# Patient Record
Sex: Female | Born: 1945 | Race: White | Hispanic: No | Smoking: Never smoker
Health system: Southern US, Community
[De-identification: ages and names within clinical notes are randomized; demographics above are authoritative.]

## PROBLEM LIST (undated history)

## (undated) DIAGNOSIS — F419 Anxiety disorder, unspecified: Secondary | ICD-10-CM

## (undated) DIAGNOSIS — I10 Essential (primary) hypertension: Secondary | ICD-10-CM

## (undated) DIAGNOSIS — H269 Unspecified cataract: Secondary | ICD-10-CM

## (undated) DIAGNOSIS — M6282 Rhabdomyolysis: Secondary | ICD-10-CM

## (undated) DIAGNOSIS — M1990 Unspecified osteoarthritis, unspecified site: Secondary | ICD-10-CM

## (undated) DIAGNOSIS — G3183 Dementia with Lewy bodies: Secondary | ICD-10-CM

## (undated) DIAGNOSIS — G20 Parkinson's disease: Secondary | ICD-10-CM

## (undated) DIAGNOSIS — T7840XA Allergy, unspecified, initial encounter: Secondary | ICD-10-CM

## (undated) DIAGNOSIS — K219 Gastro-esophageal reflux disease without esophagitis: Secondary | ICD-10-CM

## (undated) DIAGNOSIS — G459 Transient cerebral ischemic attack, unspecified: Secondary | ICD-10-CM

## (undated) DIAGNOSIS — R32 Unspecified urinary incontinence: Secondary | ICD-10-CM

## (undated) DIAGNOSIS — R1311 Dysphagia, oral phase: Secondary | ICD-10-CM

## (undated) DIAGNOSIS — R296 Repeated falls: Secondary | ICD-10-CM

## (undated) DIAGNOSIS — E785 Hyperlipidemia, unspecified: Secondary | ICD-10-CM

## (undated) DIAGNOSIS — R443 Hallucinations, unspecified: Secondary | ICD-10-CM

## (undated) DIAGNOSIS — I639 Cerebral infarction, unspecified: Secondary | ICD-10-CM

## (undated) DIAGNOSIS — F32A Depression, unspecified: Secondary | ICD-10-CM

## (undated) DIAGNOSIS — R262 Difficulty in walking, not elsewhere classified: Secondary | ICD-10-CM

## (undated) DIAGNOSIS — M797 Fibromyalgia: Secondary | ICD-10-CM

## (undated) DIAGNOSIS — R600 Localized edema: Secondary | ICD-10-CM

## (undated) DIAGNOSIS — J45909 Unspecified asthma, uncomplicated: Secondary | ICD-10-CM

## (undated) DIAGNOSIS — F329 Major depressive disorder, single episode, unspecified: Secondary | ICD-10-CM

## (undated) DIAGNOSIS — F0390 Unspecified dementia without behavioral disturbance: Secondary | ICD-10-CM

## (undated) HISTORY — DX: Repeated falls: R29.6

## (undated) HISTORY — DX: Allergy, unspecified, initial encounter: T78.40XA

## (undated) HISTORY — DX: Unspecified asthma, uncomplicated: J45.909

## (undated) HISTORY — DX: Cerebral infarction, unspecified: I63.9

## (undated) HISTORY — DX: Rhabdomyolysis: M62.82

## (undated) HISTORY — DX: Unspecified osteoarthritis, unspecified site: M19.90

## (undated) HISTORY — DX: Anxiety disorder, unspecified: F41.9

## (undated) HISTORY — DX: Dysphagia, oral phase: R13.11

## (undated) HISTORY — DX: Essential (primary) hypertension: I10

## (undated) HISTORY — PX: CATARACT EXTRACTION: SUR2

## (undated) HISTORY — PX: CHOLECYSTECTOMY: SHX55

## (undated) HISTORY — PX: TONSILLECTOMY AND ADENOIDECTOMY: SUR1326

## (undated) HISTORY — DX: Depression, unspecified: F32.A

## (undated) HISTORY — PX: ABDOMINAL HYSTERECTOMY: SHX81

## (undated) HISTORY — DX: Unspecified dementia, unspecified severity, without behavioral disturbance, psychotic disturbance, mood disturbance, and anxiety: F03.90

## (undated) HISTORY — DX: Hyperlipidemia, unspecified: E78.5

## (undated) HISTORY — DX: Transient cerebral ischemic attack, unspecified: G45.9

## (undated) HISTORY — DX: Fibromyalgia: M79.7

## (undated) HISTORY — DX: Unspecified urinary incontinence: R32

## (undated) HISTORY — DX: Localized edema: R60.0

## (undated) HISTORY — DX: Neurocognitive disorder with Lewy bodies: G31.83

## (undated) HISTORY — DX: Unspecified cataract: H26.9

## (undated) HISTORY — DX: Major depressive disorder, single episode, unspecified: F32.9

## (undated) HISTORY — DX: Hallucinations, unspecified: R44.3

## (undated) HISTORY — DX: Difficulty in walking, not elsewhere classified: R26.2

## (undated) HISTORY — DX: Parkinson's disease: G20

## (undated) HISTORY — DX: Gastro-esophageal reflux disease without esophagitis: K21.9

---

## 2016-11-20 DIAGNOSIS — Z9841 Cataract extraction status, right eye: Secondary | ICD-10-CM | POA: Diagnosis not present

## 2016-11-20 DIAGNOSIS — K5900 Constipation, unspecified: Secondary | ICD-10-CM | POA: Diagnosis present

## 2016-11-20 DIAGNOSIS — E785 Hyperlipidemia, unspecified: Secondary | ICD-10-CM | POA: Diagnosis present

## 2016-11-20 DIAGNOSIS — M6282 Rhabdomyolysis: Secondary | ICD-10-CM | POA: Diagnosis present

## 2016-11-20 DIAGNOSIS — R531 Weakness: Secondary | ICD-10-CM | POA: Diagnosis present

## 2016-11-20 DIAGNOSIS — Z9842 Cataract extraction status, left eye: Secondary | ICD-10-CM | POA: Diagnosis not present

## 2016-11-20 DIAGNOSIS — Z8673 Personal history of transient ischemic attack (TIA), and cerebral infarction without residual deficits: Secondary | ICD-10-CM | POA: Diagnosis not present

## 2016-11-20 DIAGNOSIS — F419 Anxiety disorder, unspecified: Secondary | ICD-10-CM | POA: Diagnosis present

## 2016-11-20 DIAGNOSIS — M797 Fibromyalgia: Secondary | ICD-10-CM | POA: Diagnosis present

## 2016-11-20 DIAGNOSIS — Z885 Allergy status to narcotic agent status: Secondary | ICD-10-CM | POA: Diagnosis not present

## 2016-11-20 DIAGNOSIS — E876 Hypokalemia: Secondary | ICD-10-CM | POA: Diagnosis present

## 2016-11-20 DIAGNOSIS — R609 Edema, unspecified: Secondary | ICD-10-CM | POA: Diagnosis present

## 2016-11-20 DIAGNOSIS — Z9181 History of falling: Secondary | ICD-10-CM | POA: Diagnosis not present

## 2016-11-20 DIAGNOSIS — F329 Major depressive disorder, single episode, unspecified: Secondary | ICD-10-CM | POA: Diagnosis present

## 2016-11-20 DIAGNOSIS — R32 Unspecified urinary incontinence: Secondary | ICD-10-CM | POA: Diagnosis present

## 2016-11-20 DIAGNOSIS — Z79899 Other long term (current) drug therapy: Secondary | ICD-10-CM | POA: Diagnosis not present

## 2016-11-20 DIAGNOSIS — Z9049 Acquired absence of other specified parts of digestive tract: Secondary | ICD-10-CM | POA: Diagnosis not present

## 2016-11-20 DIAGNOSIS — F0390 Unspecified dementia without behavioral disturbance: Secondary | ICD-10-CM | POA: Diagnosis present

## 2016-11-20 DIAGNOSIS — J45909 Unspecified asthma, uncomplicated: Secondary | ICD-10-CM | POA: Diagnosis present

## 2016-11-20 DIAGNOSIS — I10 Essential (primary) hypertension: Secondary | ICD-10-CM | POA: Diagnosis present

## 2017-03-18 ENCOUNTER — Non-Acute Institutional Stay (SKILLED_NURSING_FACILITY): Payer: Medicare Other | Admitting: Adult Health

## 2017-03-18 ENCOUNTER — Encounter: Payer: Self-pay | Admitting: Adult Health

## 2017-03-18 DIAGNOSIS — I10 Essential (primary) hypertension: Secondary | ICD-10-CM

## 2017-03-18 DIAGNOSIS — J45909 Unspecified asthma, uncomplicated: Secondary | ICD-10-CM

## 2017-03-18 DIAGNOSIS — F339 Major depressive disorder, recurrent, unspecified: Secondary | ICD-10-CM | POA: Diagnosis not present

## 2017-03-18 DIAGNOSIS — G20 Parkinson's disease: Secondary | ICD-10-CM

## 2017-03-18 DIAGNOSIS — R1311 Dysphagia, oral phase: Secondary | ICD-10-CM | POA: Diagnosis not present

## 2017-03-18 DIAGNOSIS — M159 Polyosteoarthritis, unspecified: Secondary | ICD-10-CM

## 2017-03-18 DIAGNOSIS — E559 Vitamin D deficiency, unspecified: Secondary | ICD-10-CM | POA: Diagnosis not present

## 2017-03-18 DIAGNOSIS — G459 Transient cerebral ischemic attack, unspecified: Secondary | ICD-10-CM

## 2017-03-18 DIAGNOSIS — M150 Primary generalized (osteo)arthritis: Secondary | ICD-10-CM

## 2017-03-18 DIAGNOSIS — F0150 Vascular dementia without behavioral disturbance: Secondary | ICD-10-CM

## 2017-03-18 DIAGNOSIS — K219 Gastro-esophageal reflux disease without esophagitis: Secondary | ICD-10-CM | POA: Diagnosis not present

## 2017-03-18 DIAGNOSIS — E785 Hyperlipidemia, unspecified: Secondary | ICD-10-CM | POA: Diagnosis not present

## 2017-03-18 DIAGNOSIS — M1990 Unspecified osteoarthritis, unspecified site: Secondary | ICD-10-CM | POA: Insufficient documentation

## 2017-03-18 HISTORY — DX: Essential (primary) hypertension: I10

## 2017-03-18 NOTE — Progress Notes (Signed)
Location:   [facility] Room Number: 121 A Place of Service:  SNF (31)   CODE STATUS: Full Code  Allergies  Allergen Reactions  . Hydrocodone Other (See Comments)    Unknown reaction    Chief Complaint  Patient presents with  . Acute Visit    Transfer follow up    HPI:  She has been moved from [place] with her daughter. She tells me that this is a long term placement. She tells me that she is still tired from her trip but otherwise feels pretty good. There are no nursing concerns at this time.    Past Medical History:  Diagnosis Date  . Allergy   . Anxiety   . Arthritis   . Asthma   . Cataract   . Dementia   . Depression   . Difficulty walking   . Dysphagia, oral phase   . Fibromyalgia   . GERD (gastroesophageal reflux disease)   . Hallucinations   . Hyperlipidemia   . Hypertension   . Lower extremity edema   . Parkinson disease (HCC)   . Repeated falls   . Rhabdomyolysis   . Stroke (HCC)   . TIA (transient ischemic attack)   . Urinary incontinence    Past Surgical History:  Procedure Laterality Date  . ABDOMINAL HYSTERECTOMY    . CATARACT EXTRACTION    . CHOLECYSTECTOMY    . TONSILLECTOMY AND ADENOIDECTOMY       Social History   Social History  . Marital status: Divorced    Spouse name: N/A  . Number of children: N/A  . Years of education: N/A   Occupational History  . Not on file.   Social History Main Topics  . Smoking status: Never Smoker  . Smokeless tobacco: Never Used  . Alcohol use No  . Drug use: No  . Sexual activity: No   Other Topics Concern  . Not on file   Social History Narrative  . No narrative on file   Family History  Problem Relation Age of Onset  . Family history unknown: Yes      VITAL SIGNS BP (!) 123/59   Pulse 76   Temp 98 F (36.7 C)   Resp 18   Ht  (1.651 m)   SpO2 95%   Patient's Medications  New Prescriptions   No medications on file  Previous Medications   ACETAMINOPHEN (TYLENOL) 325 MG TABLET    Take 650 mg by mouth every 6 (six) hours as needed for mild pain or moderate pain.   ALBUTEROL (PROVENTIL HFA;VENTOLIN HFA) 108 (90 BASE) MCG/ACT INHALER    Inhale 2 puffs into the lungs every 6 (six) hours as needed for wheezing or shortness of breath.   ALUMINUM-MAGNESIUM HYDROXIDE-SIMETHICONE (MAALOX) 200-200-20 MG/5ML SUSP    Take 30 mLs by mouth every 6 (six) hours as needed.   AMLODIPINE (NORVASC) 10 MG TABLET    Take 10 mg by mouth daily.   BISACODYL (DULCOLAX) 10 MG SUPPOSITORY    Place 10 mg rectally daily as needed for moderate constipation.   CARBIDOPA-LEVODOPA (SINEMET IR) 25-100 MG TABLET    Take 1 tablet by mouth 3 (three) times daily.   CELECOXIB (CELEBREX) 200 MG CAPSULE    Take 200 mg by mouth daily.   DONEPEZIL (ARICEPT) 10 MG TABLET    Take 10 mg by mouth daily.   DULOXETINE (CYMBALTA) 30 MG CAPSULE    Give 3 capsules by mouth one time a day  ERGOCALCIFEROL (VITAMIN D2) 50000 UNITS CAPSULE    Take 50,000 Units by mouth once a week. Every Wednesday   FLUTICASONE (FLONASE) 50 MCG/ACT NASAL SPRAY    Place 2 sprays into both nostrils daily.   LORATADINE (CLARITIN) 10 MG TABLET    Take 10 mg by mouth daily.   LORAZEPAM (ATIVAN) 1 MG TABLET    Take 1 mg by mouth every 12 (twelve) hours.    MEMANTINE (NAMENDA) 10 MG TABLET    Take 10 mg by mouth 2 (two) times daily.   PANTOPRAZOLE (PROTONIX) 40 MG TABLET    Take 40 mg by mouth daily.   ROSUVASTATIN (CRESTOR) 5 MG TABLET    Take 5 mg by mouth at bedtime.   TRAMADOL (ULTRAM) 50 MG TABLET    Take 50 mg by mouth every 6 (six) hours as needed.   UNABLE TO FIND    HSG Regular Diet - HSG Mech Soft Texture, Regular consistency   ZIPRASIDONE (GEODON) 20 MG CAPSULE    Take 20 mg by mouth daily.  Modified Medications   No medications on file  Discontinued Medications   No medications on file     SIGNIFICANT DIAGNOSTIC EXAMS    LABS REVIEWED:   None available   Review of Systems    Constitutional: Negative for malaise/fatigue.  Respiratory: Negative for cough and shortness of breath.   Cardiovascular: Negative for chest pain, palpitations and leg swelling.  Gastrointestinal: Negative for abdominal pain, constipation and heartburn.  Musculoskeletal: Negative for back pain, joint pain and myalgias.  Skin: Negative.   Neurological: Positive for tremors. Negative for dizziness.       From parkinson disease   Psychiatric/Behavioral: The patient is not nervous/anxious.     Physical Exam  Constitutional: She is oriented to person, place, and time. She appears well-developed and well-nourished. No distress.  Eyes: Conjunctivae are normal.  Neck: Neck supple. No JVD present. No thyromegaly present.  Cardiovascular: Normal rate, regular rhythm and intact distal pulses.   Respiratory: Effort normal and breath sounds normal. No respiratory distress. She has no wheezes.  GI: Soft. Bowel sounds are normal. She exhibits no distension. There is no tenderness.  Musculoskeletal: She exhibits no edema.  Able to move all extremities   Lymphadenopathy:    She has no cervical adenopathy.  Neurological: She is alert and oriented to person, place, and time.  Does have fine tremor bilaterally   Skin: Skin is warm and dry. She is not diaphoretic.  Psychiatric: She has a normal mood and affect.    ASSESSMENT/ PLAN:  1. Hypertension: b/p: 123/59: will continue norvasc 10 mg daily   2. Dyslipidemia: will continue crestor 5 mg daily   3. Osteoarthritis: will continue celebrex 200 mg daily has ultram 50 mg every 6 hours as needed for pain   4. Parkinson's disease: does have a fine tremor: will continue sinemet IR 25/100 mg three times daily  5. Vascular dementia: will continue aricept 10 mg daily and namenda 10 mg twice daily   6. Asthma with allergic rhinitis: will continue flonase nightly claritin 10 mg daily and albuterol 2 puffs every 6 hours as needed   7. Vit D deficiency:  will continue vitamin D 50,000 units weekly  8. Major recurrent depression: will continue cymbalta 90 mg daily and takes ativan 1 mg twice daily for anxiety is taking geodon 20 mg daily for feelings of agitation   9. gerd: will continue protonix 40 mg daily   10.  TIA:  is status post CVA: is neurologically stable will monitor   11. Dysphagia: no signs of aspiration present; will not make changes.     Will check cbc; cmp; hgb a1c; lipids; vit d; tsh    Time spent with patient  60   minutes >50% time spent counseling; reviewing medical record; tests; labs; and developing future plan of care   MD is aware of resident's narcotic use and is in agreement with current plan of care. We will attempt to wean resident as apropriate   Synthia Innocent NP [facility]  Contact [phone] Monday through Friday 8am- 5pm  After hours call [phone]

## 2017-03-19 LAB — HEPATIC FUNCTION PANEL
ALT: 10 U/L (ref 7–35)
AST: 14 U/L (ref 13–35)
Alkaline Phosphatase: 111 U/L (ref 25–125)
Bilirubin, Total: 0.7 mg/dL

## 2017-03-19 LAB — LIPID PANEL
CHOLESTEROL: 121 mg/dL (ref 0–200)
HDL: 39 mg/dL (ref 35–70)
LDL Cholesterol: 56 mg/dL
TRIGLYCERIDES: 134 mg/dL (ref 40–160)

## 2017-03-19 LAB — BASIC METABOLIC PANEL
BUN: 12 mg/dL (ref 4–21)
Creatinine: 0.6 mg/dL (ref 0.5–1.1)
Glucose: 95 mg/dL
POTASSIUM: 3 mmol/L — AB (ref 3.4–5.3)
SODIUM: 144 mmol/L (ref 137–147)

## 2017-03-19 LAB — HEMOGLOBIN A1C: Hemoglobin A1C: 4.7

## 2017-03-19 LAB — CBC AND DIFFERENTIAL
HCT: 40 % (ref 36–46)
HEMATOCRIT: 40 % (ref 36–46)
HEMOGLOBIN: 14.5 g/dL (ref 12.0–16.0)
Hemoglobin: 14.5 g/dL (ref 12.0–16.0)
Neutrophils Absolute: 2 /uL
Neutrophils Absolute: 2 /uL
Platelets: 229 10*3/uL (ref 150–399)
WBC: 4.7 10*3/mL
WBC: 4.7 10^3/mL

## 2017-03-19 LAB — TSH: TSH: 2.49 u[IU]/mL (ref 0.41–5.90)

## 2017-03-25 ENCOUNTER — Non-Acute Institutional Stay (SKILLED_NURSING_FACILITY): Payer: Medicare Other | Admitting: Adult Health

## 2017-03-25 ENCOUNTER — Encounter: Payer: Self-pay | Admitting: Adult Health

## 2017-03-25 DIAGNOSIS — I10 Essential (primary) hypertension: Secondary | ICD-10-CM | POA: Diagnosis not present

## 2017-03-25 DIAGNOSIS — F339 Major depressive disorder, recurrent, unspecified: Secondary | ICD-10-CM

## 2017-03-25 NOTE — Progress Notes (Signed)
Location:   [facility] Room Number: 121 A Place of Service:  SNF (31)   CODE STATUS: Full Code  Allergies  Allergen Reactions  . Hydrocodone Other (See Comments)    Unknown reaction    Chief Complaint  Patient presents with  . Acute Visit    Anxiety    HPI:  She is complaining of increased anxiety. She tells me that in the past she had been on ativan 1 mg three times daily and that twice a day is no longer effective in managing her anxiety.    Past Medical History:  Diagnosis Date  . Allergy   . Anxiety   . Arthritis   . Asthma   . Cataract   . Dementia   . Depression   . Difficulty walking   . Dysphagia, oral phase   . Fibromyalgia   . GERD (gastroesophageal reflux disease)   . Hallucinations   . Hyperlipidemia   . Hypertension   . Lower extremity edema   . Parkinson disease (HCC)   . Repeated falls   . Rhabdomyolysis   . Stroke (HCC)   . TIA (transient ischemic attack)   . Urinary incontinence     Past Surgical History:  Procedure Laterality Date  . ABDOMINAL HYSTERECTOMY    . CATARACT EXTRACTION    . CHOLECYSTECTOMY    . TONSILLECTOMY AND ADENOIDECTOMY      Social History   Social History  . Marital status: Divorced    Spouse name: N/A  . Number of children: N/A  . Years of education: N/A   Occupational History  . Not on file.   Social History Main Topics  . Smoking status: Never Smoker  . Smokeless tobacco: Never Used  . Alcohol use No  . Drug use: No  . Sexual activity: No   Other Topics Concern  . Not on file   Social History Narrative  . No narrative on file   Family History  Problem Relation Age of Onset  . Family history unknown: Yes      VITAL SIGNS BP (!) 111/55   Pulse 76   Temp 98 F (36.7 C)   Resp 18   Ht 5\' 5"  (1.651 m)   Wt 153 lb 12.8 oz (69.8 kg)   SpO2 95%   BMI 25.59 kg/m   Patient's Medications  New Prescriptions   No medications on file  Previous Medications   ACETAMINOPHEN  (TYLENOL) 325 MG TABLET    Take 650 mg by mouth every 6 (six) hours as needed for mild pain or moderate pain.   ALBUTEROL (PROVENTIL HFA;VENTOLIN HFA) 108 (90 BASE) MCG/ACT INHALER    Inhale 2 puffs into the lungs every 6 (six) hours as needed for wheezing or shortness of breath.   ALUMINUM-MAGNESIUM HYDROXIDE-SIMETHICONE (MAALOX) 200-200-20 MG/5ML SUSP    Take 30 mLs by mouth every 6 (six) hours as needed.   AMLODIPINE (NORVASC) 10 MG TABLET    Take 10 mg by mouth daily.   BISACODYL (DULCOLAX) 10 MG SUPPOSITORY    Place 10 mg rectally daily as needed for moderate constipation.   CARBIDOPA-LEVODOPA (SINEMET IR) 25-100 MG TABLET    Take 1 tablet by mouth 3 (three) times daily.   DONEPEZIL (ARICEPT) 10 MG TABLET    Take 10 mg by mouth daily.   DULOXETINE (CYMBALTA) 30 MG CAPSULE    Give 3 capsules by mouth one time a day   ERGOCALCIFEROL (VITAMIN D2) 50000 UNITS CAPSULE  Take 50,000 Units by mouth once a week. Every Wednesday   FLUTICASONE (FLONASE) 50 MCG/ACT NASAL SPRAY    Place 2 sprays into both nostrils daily.   LORATADINE (CLARITIN) 10 MG TABLET    Take 10 mg by mouth daily.   LORAZEPAM (ATIVAN) 1 MG TABLET    Take 1 mg by mouth every 12 (twelve) hours.    MELOXICAM (MOBIC) 7.5 MG TABLET    Take 7.5 mg by mouth daily.   MEMANTINE (NAMENDA) 10 MG TABLET    Take 10 mg by mouth 2 (two) times daily.   PANTOPRAZOLE (PROTONIX) 40 MG TABLET    Take 40 mg by mouth daily.   ROSUVASTATIN (CRESTOR) 5 MG TABLET    Take 5 mg by mouth at bedtime.   TRAMADOL (ULTRAM) 50 MG TABLET    Take 50 mg by mouth every 6 (six) hours as needed.   UNABLE TO FIND    HSG Regular Diet - HSG Mech Soft Texture, Regular consistency   ZIPRASIDONE (GEODON) 20 MG CAPSULE    Take 20 mg by mouth daily.  Modified Medications   No medications on file  Discontinued Medications   CELECOXIB (CELEBREX) 200 MG CAPSULE    Take 200 mg by mouth daily.     SIGNIFICANT DIAGNOSTIC EXAMS  LABS REVIEWED:   02-19-17: wbc 8.6; hgb 14.8;  hct 45.9; mcv 91.6; plt 201; glucose 132; bun 21.9; creat 1.22; k+ 4.6; na++ 141; liver normal albumin 3.5    Review of Systems  Constitutional: Negative for malaise/fatigue.  Respiratory: Negative for cough and shortness of breath.   Cardiovascular: Negative for chest pain, palpitations and leg swelling.  Gastrointestinal: Negative for abdominal pain, constipation and heartburn.  Musculoskeletal: Negative for back pain, joint pain and myalgias.  Skin: Negative.   Neurological: Positive for tremors. Negative for dizziness.       From parkinson disease   Psychiatric/Behavioral: has anxiety      Physical Exam  Constitutional: She is oriented to person, place, and time. She appears well-developed and well-nourished. No distress.  Eyes: Conjunctivae are normal.  Neck: Neck supple. No JVD present. No thyromegaly present.  Cardiovascular: Normal rate, regular rhythm and intact distal pulses.   Respiratory: Effort normal and breath sounds normal. No respiratory distress. She has no wheezes.  GI: Soft. Bowel sounds are normal. She exhibits no distension. There is no tenderness.  Musculoskeletal: She exhibits no edema.  Able to move all extremities   Lymphadenopathy:    She has no cervical adenopathy.  Neurological: She is alert and oriented to person, place, and time.  Does have fine tremor bilaterally   Skin: Skin is warm and dry. She is not diaphoretic.  Psychiatric: She has a normal mood and affect.    ASSESSMENT/ PLAN:  1. Hypertension: b/p: 111/55: will continue norvasc 10 mg daily   2. Major recurrent depression: will continue cymbalta 90 mg daily and takes geodon 20 mg daily for feelings of agitation  Will increase her ativan to 1 mg three times daily      MD is aware of resident's narcotic use and is in agreement with current plan of care. We will attempt to wean resident as apropriate    Synthia Innocent NP [facility]  Contact [phone] Monday through  Friday 8am- 5pm  After hours call [phone]

## 2017-04-06 ENCOUNTER — Non-Acute Institutional Stay (SKILLED_NURSING_FACILITY): Payer: Medicare Other | Admitting: Internal Medicine

## 2017-04-06 ENCOUNTER — Encounter: Payer: Self-pay | Admitting: Internal Medicine

## 2017-04-06 DIAGNOSIS — M150 Primary generalized (osteo)arthritis: Secondary | ICD-10-CM | POA: Diagnosis not present

## 2017-04-06 DIAGNOSIS — G20 Parkinson's disease: Secondary | ICD-10-CM | POA: Diagnosis not present

## 2017-04-06 DIAGNOSIS — Z8673 Personal history of transient ischemic attack (TIA), and cerebral infarction without residual deficits: Secondary | ICD-10-CM | POA: Diagnosis not present

## 2017-04-06 DIAGNOSIS — E785 Hyperlipidemia, unspecified: Secondary | ICD-10-CM | POA: Diagnosis not present

## 2017-04-06 DIAGNOSIS — M159 Polyosteoarthritis, unspecified: Secondary | ICD-10-CM

## 2017-04-06 DIAGNOSIS — F0150 Vascular dementia without behavioral disturbance: Secondary | ICD-10-CM | POA: Diagnosis not present

## 2017-04-06 DIAGNOSIS — I10 Essential (primary) hypertension: Secondary | ICD-10-CM

## 2017-04-06 DIAGNOSIS — J45909 Unspecified asthma, uncomplicated: Secondary | ICD-10-CM

## 2017-04-06 DIAGNOSIS — F339 Major depressive disorder, recurrent, unspecified: Secondary | ICD-10-CM

## 2017-04-06 DIAGNOSIS — E559 Vitamin D deficiency, unspecified: Secondary | ICD-10-CM | POA: Diagnosis not present

## 2017-04-06 DIAGNOSIS — K219 Gastro-esophageal reflux disease without esophagitis: Secondary | ICD-10-CM

## 2017-04-06 NOTE — Progress Notes (Signed)
Patient ID: Jennifer Garner, female   DOB: 1946/06/18, 71 y.o.   MRN: [id number]    HISTORY AND PHYSICAL   DATE: 04/06/2017  Location:    [facility] Room Number: 111 B Place of Service: SNF (31)   Extended Emergency Contact Information Primary Emergency Contact: [name] Home Phone: [phone] Relation: Daughter Secondary Emergency Contact: [name],[name]  [place] Home Phone: [phone] Relation: Daughter  Advanced Directive information Does Patient Have a Medical Advance Directive?: Yes, Would patient like information on creating a medical advance directive?: No - Patient declined, Type of Advance Directive: Out of facility DNR (pink MOST or yellow form), Pre-existing out of facility DNR order (yellow form or pink MOST form): Pink MOST form placed in chart (order not valid for inpatient use), Does patient want to make changes to medical advance directive?: No - Patient declined  Chief Complaint  Patient presents with  . New Admit To SNF    Admission    HPI:  71 yo female seen today as a new admission into SNF following transfer from [place] SNF to be closer to family. She has a hx dementia, FMS, arthritis, frequent falls, Parkinson's, HTN, hyperlipidemia, hallucinations, hx CVA/TIA, depression/anxiety, urinary incontinence and asthma. No labs/nursing notes from previous facility available. She will be a long term resident.  Today she reports c/a increased tremors/stiffness. She saw a neurologist in [place]. She is a poor historian due to dementia. Hx obtained from chart.  HTN - BP stable on norvasc 10 mg daily   Dyslipidemia - stable on lipitor. LDL 56  Osteoarthritis/FMS - stable on meloxicam and ultram 50 mg every 6 hours as needed for pain; tylenol prn   Parkinson's disease - tremor uncontrolled. Takes sinemet IR 25/100 mg three times daily. She has seen neurology in the past  Vascular dementia - she has behavioral  disturbance. Takes aricept 10 mg daily and namenda 10 mg twice daily. She has prn lorazepam and geodone for agitation/psychosis. She has hallucinations  Asthma with allergic rhinitis - stable on flonase nightly; claritin 10 mg daily and albuterol 2 puffs every 6 hours as needed   Vitamin D deficiency - stable on vitamin D 50,000 units weekly  Major recurrent depression/anxiety - mood stable on cymbalta 90 mg daily; ativan 1 mg twice daily for anxiety; geodon 20 mg daily for psychosis   GERD - stable on protonix 40 mg daily   Hx TIA/ hx CVA - stable.   Hx urinary incontinence  Hx frequent falls Past Medical History:  Diagnosis Date  . Allergy   . Anxiety   . Arthritis   . Asthma   . Cataract   . Dementia   . Depression   . Difficulty walking   . Dysphagia, oral phase   . Fibromyalgia   . GERD (gastroesophageal reflux disease)   . Hallucinations   . Hyperlipidemia   . Hypertension   . Lower extremity edema   . Parkinson disease (HCC)   . Repeated falls   . Rhabdomyolysis   . Stroke (HCC)   . TIA (transient ischemic attack)   . Urinary incontinence     Past Surgical History:  Procedure Laterality Date  . ABDOMINAL HYSTERECTOMY    . CATARACT EXTRACTION    . CHOLECYSTECTOMY    . TONSILLECTOMY AND ADENOIDECTOMY      No care team member to display  Social History   Social History  . Marital status: Divorced    Spouse name:  N/A  . Number of children: N/A  . Years of education: N/A   Occupational History  . Not on file.   Social History Main Topics  . Smoking status: Never Smoker  . Smokeless tobacco: Never Used  . Alcohol use No  . Drug use: No  . Sexual activity: No   Other Topics Concern  . Not on file   Social History Narrative  . No narrative on file     reports that she has never smoked. She has never used smokeless tobacco. She reports that she does not drink alcohol or use drugs.  Family History  Problem Relation Age of Onset  . Family  history unknown: Yes   No family status information on file.    Immunization History  Administered Date(s) Administered  . Influenza-Unspecified 03/17/2017  . PPD Test 03/24/2017  . Pneumococcal Polysaccharide-23 03/17/2017    Allergies  Allergen Reactions  . Hydrocodone Other (See Comments)    Unknown reaction    Medications: Patient's Medications  New Prescriptions   No medications on file  Previous Medications   ACETAMINOPHEN (TYLENOL) 325 MG TABLET    Take 650 mg by mouth every 6 (six) hours as needed for mild pain or moderate pain.   ALBUTEROL (PROVENTIL HFA;VENTOLIN HFA) 108 (90 BASE) MCG/ACT INHALER    Inhale 2 puffs into the lungs every 6 (six) hours as needed for wheezing or shortness of breath.   ALUMINUM-MAGNESIUM HYDROXIDE-SIMETHICONE (MAALOX) 200-200-20 MG/5ML SUSP    Take 30 mLs by mouth every 6 (six) hours as needed.   AMLODIPINE (NORVASC) 10 MG TABLET    Take 10 mg by mouth daily.   ATORVASTATIN (LIPITOR) 10 MG TABLET    Take 10 mg by mouth daily.   BISACODYL (DULCOLAX) 10 MG SUPPOSITORY    Place 10 mg rectally daily as needed for moderate constipation.   CARBIDOPA-LEVODOPA (SINEMET IR) 25-100 MG TABLET    Take 1 tablet by mouth 3 (three) times daily.   DONEPEZIL (ARICEPT) 10 MG TABLET    Take 10 mg by mouth daily.   DULOXETINE (CYMBALTA) 30 MG CAPSULE    Give 3 capsules by mouth one time a day   ERGOCALCIFEROL (VITAMIN D2) 50000 UNITS CAPSULE    Take 50,000 Units by mouth once a week. Every Wednesday   FLUTICASONE (FLONASE) 50 MCG/ACT NASAL SPRAY    Place 2 sprays into both nostrils daily.   LORATADINE (CLARITIN) 10 MG TABLET    Take 10 mg by mouth daily.   LORAZEPAM (ATIVAN) 1 MG TABLET    Take 1 mg by mouth every 8 (eight) hours.    MELOXICAM (MOBIC) 7.5 MG TABLET    Take 7.5 mg by mouth daily.   MEMANTINE (NAMENDA) 10 MG TABLET    Take 10 mg by mouth 2 (two) times daily.   PANTOPRAZOLE (PROTONIX) 40 MG TABLET    Take 40 mg by mouth daily.   TRAMADOL (ULTRAM)  50 MG TABLET    Take 50 mg by mouth every 6 (six) hours as needed.   UNABLE TO FIND    HSG Regular Diet - HSG Mech Soft Texture, Regular consistency   ZIPRASIDONE (GEODON) 20 MG CAPSULE    Take 20 mg by mouth every other day.   Modified Medications   No medications on file  Discontinued Medications   ROSUVASTATIN (CRESTOR) 5 MG TABLET    Take 5 mg by mouth at bedtime.    Review of Systems  Unable to perform ROS: Dementia  Vitals:   04/06/17 1235  BP: 110/60  Pulse: 76  Resp: 16  Temp: 98.5 F (36.9 C)  TempSrc: Oral  SpO2: 93%  Weight: 153 lb 9.6 oz (69.7 kg)  Height: 5\' 5"  (1.651 m)   Body mass index is 25.56 kg/m.  Physical Exam  Constitutional: She appears well-developed and well-nourished.  Lying in bed in NAD  HENT:  Mouth/Throat: Oropharynx is clear and moist. No oropharyngeal exudate.  MMM; no oral thrush  Eyes: Pupils are equal, round, and reactive to light. No scleral icterus.  Neck: Neck supple. Carotid bruit is not present. No tracheal deviation present. No thyromegaly present.  Cardiovascular: Normal rate, regular rhythm and intact distal pulses.  Exam reveals no gallop and no friction rub.   Murmur (1/6 SEM) heard. No LE edema b/l. no calf TTP.   Pulmonary/Chest: Effort normal and breath sounds normal. No stridor. No respiratory distress. She has no wheezes. She has no rales.  Abdominal: Soft. Bowel sounds are normal. She exhibits no distension and no mass. There is no hepatomegaly. There is no tenderness. There is no rebound and no guarding.  Musculoskeletal: She exhibits edema.  Lymphadenopathy:    She has no cervical adenopathy.  Neurological: She is alert. She displays tremor (resting).  No cogwheel rigidity; (+) repetitive stiffness  Skin: Skin is warm and dry. No rash noted.  Psychiatric: She has a normal mood and affect. Her behavior is normal.     Labs reviewed: Nursing Home on 03/25/2017  Component Date Value Ref Range Status  . Hemoglobin  03/19/2017 14.5  12.0 - 16.0 g/dL Final  . HCT 16/08/9603 40  36 - 46 % Final  . Neutrophils Absolute 03/19/2017 2  /L Final  . WBC 03/19/2017 4.7  10^3/mL Final  . Glucose 03/19/2017 95  mg/dL Final  . BUN 54/07/8118 12  4 - 21 mg/dL Final  . Creatinine 14/78/2956 0.6  0.5 - 1.1 mg/dL Final  . Potassium 21/30/8657 3.0* 3.4 - 5.3 mmol/L Final  . Sodium 03/19/2017 144  137 - 147 mmol/L Final  . Triglycerides 03/19/2017 134  40 - 160 mg/dL Final  . Cholesterol 84/69/6295 121  0 - 200 mg/dL Final  . HDL 28/41/3244 39  35 - 70 mg/dL Final  . LDL Cholesterol 03/19/2017 56  mg/dL Final  . Alkaline Phosphatase 03/19/2017 111  25 - 125 U/L Final  . ALT 03/19/2017 10  7 - 35 U/L Final  . AST 03/19/2017 14  13 - 35 U/L Final  . Bilirubin, Total 03/19/2017 0.7  mg/dL Final  . Hemoglobin W1U 03/19/2017 4.7   Final  . TSH 03/19/2017 2.49  0.41 - 5.90 uIU/mL Final  Abstract on 03/24/2017  Component Date Value Ref Range Status  . Hemoglobin 03/19/2017 14.5  12.0 - 16.0 g/dL Final  . HCT 27/25/3664 40  36 - 46 % Final  . Neutrophils Absolute 03/19/2017 2  /L Final  . Platelets 03/19/2017 229  150 - 399 K/L Final  . WBC 03/19/2017 4.7  10^3/mL Final    No results found.   Assessment/Plan   ICD-10-CM   1. Parkinson disease (HCC) G20    uncontrolled tremor  2. Recurrent major depressive disorder, remission status unspecified (HCC) F33.9   3. Benign essential hypertension I10   4. Vascular dementia without behavioral disturbance F01.50   5. Dyslipidemia E78.5   6. Primary osteoarthritis involving multiple joints M15.0   7. Asthma with allergic rhinitis without complication, unspecified asthma severity, unspecified whether  persistent J45.909   8. GERD without esophagitis K21.9   9. History of CVA in adulthood 104Z86.73   10. Vitamin D deficiency E55.9     Refer to neurology for possible worsening Parkinson's disease  Increase sinemet to QID  Cont other meds as ordered  Will need to  follow K as last level 3  PT/OT/ST as ordered  GOAL: short term rehab then long term care. Communicated with pt and nursing.  Will follow  Jaecion Dempster S. Ancil Linseyarter, D. O., F. A. C. O. I.  [facility] [address] [place], [place] [address] [phone] Cell (Monday-Friday 8 AM - 5 PM) [phone] After 5 PM and follow prompts

## 2017-04-09 DIAGNOSIS — F329 Major depressive disorder, single episode, unspecified: Secondary | ICD-10-CM | POA: Diagnosis not present

## 2017-04-09 DIAGNOSIS — F0390 Unspecified dementia without behavioral disturbance: Secondary | ICD-10-CM | POA: Diagnosis not present

## 2017-04-09 DIAGNOSIS — F419 Anxiety disorder, unspecified: Secondary | ICD-10-CM | POA: Diagnosis not present

## 2017-04-09 DIAGNOSIS — G20 Parkinson's disease: Secondary | ICD-10-CM | POA: Diagnosis not present

## 2017-04-17 ENCOUNTER — Encounter: Payer: Self-pay | Admitting: Adult Health

## 2017-04-17 ENCOUNTER — Non-Acute Institutional Stay (SKILLED_NURSING_FACILITY): Payer: Medicare Other | Admitting: Adult Health

## 2017-04-17 DIAGNOSIS — E785 Hyperlipidemia, unspecified: Secondary | ICD-10-CM

## 2017-04-17 DIAGNOSIS — I10 Essential (primary) hypertension: Secondary | ICD-10-CM

## 2017-04-17 DIAGNOSIS — F330 Major depressive disorder, recurrent, mild: Secondary | ICD-10-CM

## 2017-04-17 DIAGNOSIS — J45909 Unspecified asthma, uncomplicated: Secondary | ICD-10-CM | POA: Diagnosis not present

## 2017-04-17 DIAGNOSIS — F0150 Vascular dementia without behavioral disturbance: Secondary | ICD-10-CM | POA: Diagnosis not present

## 2017-04-17 DIAGNOSIS — G20 Parkinson's disease: Secondary | ICD-10-CM

## 2017-04-17 DIAGNOSIS — M150 Primary generalized (osteo)arthritis: Secondary | ICD-10-CM

## 2017-04-17 DIAGNOSIS — R1311 Dysphagia, oral phase: Secondary | ICD-10-CM

## 2017-04-17 DIAGNOSIS — K219 Gastro-esophageal reflux disease without esophagitis: Secondary | ICD-10-CM | POA: Diagnosis not present

## 2017-04-17 DIAGNOSIS — M159 Polyosteoarthritis, unspecified: Secondary | ICD-10-CM

## 2017-04-17 DIAGNOSIS — M6282 Rhabdomyolysis: Secondary | ICD-10-CM | POA: Diagnosis not present

## 2017-04-17 NOTE — Progress Notes (Signed)
Location:   [facility] Room Number: 111B Place of Service:  [facility]   CODE STATUS: Full Code/MOST  Allergies  Allergen Reactions  . Hydrocodone Other (See Comments)    Unknown reaction    Chief Complaint  Patient presents with  . Medical Management of Chronic Issues    Routine visit    HPI:  71 year old female; resident of this facility being seen for the management of her chronic illnesses. Overall there is little change in her status. She is complaining of constipation. There are no nursing concerns at this time.    Past Medical History:  Diagnosis Date  . Allergy   . Anxiety   . Arthritis   . Asthma   . Cataract   . Dementia   . Depression   . Difficulty walking   . Dysphagia, oral phase   . Fibromyalgia   . GERD (gastroesophageal reflux disease)   . Hallucinations   . Hyperlipidemia   . Hypertension   . Lower extremity edema   . Parkinson disease (HCC)   . Repeated falls   . Rhabdomyolysis   . Stroke (HCC)   . TIA (transient ischemic attack)   . Urinary incontinence     Past Surgical History:  Procedure Laterality Date  . ABDOMINAL HYSTERECTOMY    . CATARACT EXTRACTION    . CHOLECYSTECTOMY    . TONSILLECTOMY AND ADENOIDECTOMY      Social History   Social History  . Marital status: Divorced    Spouse name: N/A  . Number of children: N/A  . Years of education: N/A   Occupational History  . Not on file.   Social History Main Topics  . Smoking status: Never Smoker  . Smokeless tobacco: Never Used  . Alcohol use No  . Drug use: No  . Sexual activity: No   Other Topics Concern  . Not on file   Social History Narrative  . No narrative on file   Family History  Problem Relation Age of Onset  . Family history unknown: Yes      VITAL SIGNS BP 105/66   Pulse 76   Temp 98.5 F (36.9 C)   Resp 16   Ht 5\' 5"  (1.651 m)   Wt 153 lb 9.6 oz (69.7 kg)   SpO2 93%   BMI 25.56 kg/m   Patient's Medications  New  Prescriptions   No medications on file  Previous Medications   ACETAMINOPHEN (TYLENOL) 325 MG TABLET    Take 650 mg by mouth every 6 (six) hours as needed for mild pain or moderate pain.   ALBUTEROL (PROVENTIL HFA;VENTOLIN HFA) 108 (90 BASE) MCG/ACT INHALER    Inhale 2 puffs into the lungs every 6 (six) hours as needed for wheezing or shortness of breath.   ALUMINUM-MAGNESIUM HYDROXIDE-SIMETHICONE (MAALOX) 200-200-20 MG/5ML SUSP    Take 30 mLs by mouth every 6 (six) hours as needed.   AMLODIPINE (NORVASC) 10 MG TABLET    Take 10 mg by mouth daily.   ATORVASTATIN (LIPITOR) 10 MG TABLET    Take 10 mg by mouth daily.   BISACODYL (DULCOLAX) 10 MG SUPPOSITORY    Place 10 mg rectally daily as needed for moderate constipation.   CARBIDOPA-LEVODOPA (SINEMET IR) 25-100 MG TABLET    Take 1 tablet by mouth 4 (four) times daily.    DULOXETINE (CYMBALTA) 30 MG CAPSULE    Give 3 capsules by mouth one time a day   ERGOCALCIFEROL (VITAMIN D2) 50000  UNITS CAPSULE    Take 50,000 Units by mouth once a week. Every Wednesday   FLUTICASONE (FLONASE) 50 MCG/ACT NASAL SPRAY    Place 2 sprays into both nostrils daily.   LORATADINE (CLARITIN) 10 MG TABLET    Take 10 mg by mouth daily.   LORAZEPAM (ATIVAN) 1 MG TABLET    Take 1 mg by mouth every 8 (eight) hours.    MELOXICAM (MOBIC) 7.5 MG TABLET    Take 7.5 mg by mouth daily.   MEMANTINE (NAMENDA) 10 MG TABLET    Take 10 mg by mouth 2 (two) times daily.   PANTOPRAZOLE (PROTONIX) 40 MG TABLET    Take 40 mg by mouth daily.   TRAMADOL (ULTRAM) 50 MG TABLET    Take 50 mg by mouth every 6 (six) hours as needed.   UNABLE TO FIND    HSG Regular Diet - HSG Mech Soft Texture, Regular consistency  Modified Medications   No medications on file  Discontinued Medications   DONEPEZIL (ARICEPT) 10 MG TABLET    Take 10 mg by mouth daily.   ZIPRASIDONE (GEODON) 20 MG CAPSULE    Take 20 mg by mouth every other day.      SIGNIFICANT DIAGNOSTIC EXAMS    LABS REVIEWED:  03-19-17:  wbc 4.7; hgb 14.5; hct 40.0; mcv 88.8; plt 229; glucose 95; bun 21.6; creat 0.55; k+ 3.0; na++ 144; ca 9.7 liver normal albumin 4.5; tsh 2.69; hgb a1c 4.7; chol 121; ldl 56; trig 134; hdl 39    Review of Systems  Constitutional: Negative for malaise/fatigue.  Respiratory: Negative for cough and shortness of breath.   Cardiovascular: Negative for chest pain, palpitations and leg swelling.  Gastrointestinal: Positive for constipation. Negative for abdominal pain and heartburn.  Musculoskeletal: Negative for back pain, joint pain and myalgias.  Skin: Negative.   Neurological: Positive for tremors. Negative for dizziness.       Has parkinson disease   Psychiatric/Behavioral: The patient is not nervous/anxious.     Physical Exam  Constitutional: No distress.  Eyes: Conjunctivae are normal.  Neck: Neck supple. No JVD present. No thyromegaly present.  Cardiovascular: Normal rate, regular rhythm and intact distal pulses.   Respiratory: Effort normal and breath sounds normal. No respiratory distress. She has no wheezes.  GI: Soft. Bowel sounds are normal. She exhibits no distension. There is no tenderness.  Musculoskeletal: She exhibits no edema.  Able to move all extremities  Has bilateral hand tremor   Lymphadenopathy:    She has no cervical adenopathy.  Neurological: She is alert.  Skin: Skin is warm and dry. She is not diaphoretic.  Psychiatric: She has a normal mood and affect.      ASSESSMENT/ PLAN:  1. Hypertension: b/p 105/66: will continue norvasc 10 mg daily   2. Dyslipidemia: ldl 56: will continue lipitor 10 mg daily   3.  Asthma with allergic rhinitis: will continue flonase daily; claritin 10 mg daily and albuterol 2 puffs every 6 hours as needed  4. Gerd: will continue protonix 40 mg daily   5. Dysphagia: no signs of aspiration present; will continue current plan of care  6. Parkinson's disease: will continue sinemet IR 25/100 mg four times daily   7. Vascular  dementia: her weight is 153 pounds; will continue to monitor her status.   8. Recurrent major depression with anxiety: will continue cymbalta 30 mg daily and ativan 1 mg three times daily   9. Osteoarthritis: will continue mobic 7.5 mg daily  and has ultram 50 mg every 6 hours as needed  10. Vit D deficiency:  Will continue vit d 50,000 units weekly   11. Constipation: will begin miralax 17 gm daily     MD is aware of resident's narcotic use and is in agreement with current plan of care. We will attempt to wean resident as apropriate   Synthia Innocent NP [facility]  Contact [phone] Monday through Friday 8am- 5pm  After hours call [phone]

## 2017-04-18 DIAGNOSIS — M6282 Rhabdomyolysis: Secondary | ICD-10-CM | POA: Diagnosis not present

## 2017-04-20 DIAGNOSIS — M6282 Rhabdomyolysis: Secondary | ICD-10-CM | POA: Diagnosis not present

## 2017-04-21 DIAGNOSIS — M6282 Rhabdomyolysis: Secondary | ICD-10-CM | POA: Diagnosis not present

## 2017-04-22 DIAGNOSIS — M6282 Rhabdomyolysis: Secondary | ICD-10-CM | POA: Diagnosis not present

## 2017-04-23 DIAGNOSIS — M6282 Rhabdomyolysis: Secondary | ICD-10-CM | POA: Diagnosis not present

## 2017-04-24 DIAGNOSIS — M6282 Rhabdomyolysis: Secondary | ICD-10-CM | POA: Diagnosis not present

## 2017-04-27 DIAGNOSIS — M6282 Rhabdomyolysis: Secondary | ICD-10-CM | POA: Diagnosis not present

## 2017-04-28 DIAGNOSIS — M6282 Rhabdomyolysis: Secondary | ICD-10-CM | POA: Diagnosis not present

## 2017-04-29 DIAGNOSIS — F329 Major depressive disorder, single episode, unspecified: Secondary | ICD-10-CM | POA: Diagnosis not present

## 2017-04-29 DIAGNOSIS — G20 Parkinson's disease: Secondary | ICD-10-CM | POA: Diagnosis not present

## 2017-04-29 DIAGNOSIS — F419 Anxiety disorder, unspecified: Secondary | ICD-10-CM | POA: Diagnosis not present

## 2017-04-29 DIAGNOSIS — F0390 Unspecified dementia without behavioral disturbance: Secondary | ICD-10-CM | POA: Diagnosis not present

## 2017-04-29 DIAGNOSIS — M6282 Rhabdomyolysis: Secondary | ICD-10-CM | POA: Diagnosis not present

## 2017-04-30 DIAGNOSIS — Z9181 History of falling: Secondary | ICD-10-CM | POA: Diagnosis not present

## 2017-04-30 DIAGNOSIS — G20 Parkinson's disease: Secondary | ICD-10-CM | POA: Diagnosis not present

## 2017-04-30 DIAGNOSIS — M6282 Rhabdomyolysis: Secondary | ICD-10-CM | POA: Diagnosis not present

## 2017-05-01 DIAGNOSIS — M6282 Rhabdomyolysis: Secondary | ICD-10-CM | POA: Diagnosis not present

## 2017-05-04 DIAGNOSIS — M6282 Rhabdomyolysis: Secondary | ICD-10-CM | POA: Diagnosis not present

## 2017-05-05 DIAGNOSIS — M6282 Rhabdomyolysis: Secondary | ICD-10-CM | POA: Diagnosis not present

## 2017-05-06 DIAGNOSIS — M6282 Rhabdomyolysis: Secondary | ICD-10-CM | POA: Diagnosis not present

## 2017-05-06 DIAGNOSIS — Z9181 History of falling: Secondary | ICD-10-CM | POA: Diagnosis not present

## 2017-05-06 DIAGNOSIS — G20 Parkinson's disease: Secondary | ICD-10-CM | POA: Diagnosis not present

## 2017-05-07 DIAGNOSIS — M6282 Rhabdomyolysis: Secondary | ICD-10-CM | POA: Diagnosis not present

## 2017-05-08 DIAGNOSIS — M6282 Rhabdomyolysis: Secondary | ICD-10-CM | POA: Diagnosis not present

## 2017-05-11 DIAGNOSIS — M6282 Rhabdomyolysis: Secondary | ICD-10-CM | POA: Diagnosis not present

## 2017-05-12 DIAGNOSIS — M6282 Rhabdomyolysis: Secondary | ICD-10-CM | POA: Diagnosis not present

## 2017-05-13 DIAGNOSIS — M6282 Rhabdomyolysis: Secondary | ICD-10-CM | POA: Diagnosis not present

## 2017-05-13 DIAGNOSIS — G20 Parkinson's disease: Secondary | ICD-10-CM | POA: Diagnosis not present

## 2017-05-13 DIAGNOSIS — Z9181 History of falling: Secondary | ICD-10-CM | POA: Diagnosis not present

## 2017-05-14 ENCOUNTER — Non-Acute Institutional Stay (SKILLED_NURSING_FACILITY): Payer: Medicare Other

## 2017-05-14 DIAGNOSIS — Z0000 Encounter for general adult medical examination without abnormal findings: Secondary | ICD-10-CM

## 2017-05-14 DIAGNOSIS — M6282 Rhabdomyolysis: Secondary | ICD-10-CM | POA: Diagnosis not present

## 2017-05-14 NOTE — Progress Notes (Signed)
Subjective:   Jennifer Garner is a 71 y.o. female who presents for an Initial Medicare Annual Wellness Visit at [facility] long [facility]       Objective:    Today's Vitals   05/14/17 1320  BP: 130/60  Pulse: 74  Temp: 97.9 F (36.6 C)  TempSrc: Oral  SpO2: 96%  Weight: 154 lb (69.9 kg)  Height: 5\' 5"  (1.651 m)   Body mass index is 25.63 kg/m.   Current Medications (verified) Outpatient Encounter Prescriptions as of 05/14/2017  Medication Sig  . acetaminophen (TYLENOL) 500 MG tablet Take 1,000 mg by mouth 2 (two) times daily.  Marland Kitchen albuterol (PROVENTIL HFA;VENTOLIN HFA) 108 (90 Base) MCG/ACT inhaler Inhale 2 puffs into the lungs every 6 (six) hours as needed for wheezing or shortness of breath.  Marland Kitchen aluminum-magnesium hydroxide-simethicone (MAALOX) 200-200-20 MG/5ML SUSP Take 30 mLs by mouth every 6 (six) hours as needed.  Marland Kitchen amLODipine (NORVASC) 10 MG tablet Take 10 mg by mouth daily.  Marland Kitchen atorvastatin (LIPITOR) 10 MG tablet Take 10 mg by mouth daily.  . bisacodyl (DULCOLAX) 10 MG suppository Place 10 mg rectally daily as needed for moderate constipation.  . carbidopa-levodopa (SINEMET IR) 25-100 MG tablet Take 1 tablet by mouth 4 (four) times daily.   . DULoxetine (CYMBALTA) 30 MG capsule Give 3 capsules by mouth one time a day  . ergocalciferol (VITAMIN D2) 50000 units capsule Take 50,000 Units by mouth once a week. Every Wednesday  . fluticasone (FLONASE) 50 MCG/ACT nasal spray Place 2 sprays into both nostrils daily.  Marland Kitchen loratadine (CLARITIN) 10 MG tablet Take 10 mg by mouth daily.  Marland Kitchen LORazepam (ATIVAN) 1 MG tablet Take 1 mg by mouth every 8 (eight) hours.   . meloxicam (MOBIC) 7.5 MG tablet Take 7.5 mg by mouth daily.  . memantine (NAMENDA) 10 MG tablet Take 10 mg by mouth 2 (two) times daily.  . pantoprazole (PROTONIX) 40 MG tablet Take 40 mg by mouth daily.  . polyethylene glycol (MIRALAX / GLYCOLAX) packet Take 17 g by mouth daily.  . traMADol (ULTRAM) 50 MG tablet Take 25  mg by mouth 2 (two) times daily.  Marland Kitchen UNABLE TO FIND HSG Regular Diet - HSG Mech Soft Texture, Regular consistency  . [DISCONTINUED] acetaminophen (TYLENOL) 325 MG tablet Take 650 mg by mouth every 6 (six) hours as needed for mild pain or moderate pain.  . [DISCONTINUED] traMADol (ULTRAM) 50 MG tablet Take 50 mg by mouth every 6 (six) hours as needed.   No facility-administered encounter medications on file as of 05/14/2017.     Allergies (verified) Hydrocodone   History: Past Medical History:  Diagnosis Date  . Allergy   . Anxiety   . Arthritis   . Asthma   . Cataract   . Dementia   . Depression   . Difficulty walking   . Dysphagia, oral phase   . Fibromyalgia   . GERD (gastroesophageal reflux disease)   . Hallucinations   . Hyperlipidemia   . Hypertension   . Lower extremity edema   . Parkinson disease (HCC)   . Repeated falls   . Rhabdomyolysis   . Stroke (HCC)   . TIA (transient ischemic attack)   . Urinary incontinence    Past Surgical History:  Procedure Laterality Date  . ABDOMINAL HYSTERECTOMY    . CATARACT EXTRACTION    . CHOLECYSTECTOMY    . TONSILLECTOMY AND ADENOIDECTOMY     Family History  Problem Relation Age of Onset  .  Family history unknown: Yes   Social History   Occupational History  . Not on file.   Social History Main Topics  . Smoking status: Never Smoker  . Smokeless tobacco: Never Used  . Alcohol use No  . Drug use: No  . Sexual activity: No    Tobacco Counseling Counseling given: Not Answered   Activities of Daily Living In your present state of health, do you have any difficulty performing the following activities: 05/14/2017  Hearing? N  Vision? Y  Difficulty concentrating or making decisions? Y  Walking or climbing stairs? Y  Dressing or bathing? Y  Doing errands, shopping? Y  Preparing Food and eating ? Y  Using the Toilet? Y  In the past six months, have you accidently leaked urine? Y  Do you have problems with loss  of bowel control? Y  Managing your Medications? Y  Managing your Finances? Y  Housekeeping or managing your Housekeeping? Y    Immunizations and Health Maintenance Immunization History  Administered Date(s) Administered  . Influenza-Unspecified 03/17/2017  . PPD Test 03/24/2017  . Pneumococcal Polysaccharide-23 03/17/2017   There are no preventive care reminders to display for this patient.  No care team member to display  Indicate any recent Medical Services you may have received from other than [facility] providers in the past year (date may be approximate).     Assessment:   This is a routine wellness examination for [facility].   Hearing/Vision screen No exam data present  Dietary issues and exercise activities discussed: Current Exercise Habits: Structured exercise class, Type of exercise: Other - see comments (PT), Time (Minutes): 60, Frequency (Times/Week): 3, Weekly Exercise (Minutes/Week): 180, Exercise limited by: orthopedic condition(s)  Goals    None     Depression Screen PHQ 2/9 Scores 05/14/2017  PHQ - 2 Score 0    Fall Risk Fall Risk  05/14/2017  Falls in the past year? Yes  Number falls in past yr: 2 or more  Injury with Fall? No    Cognitive Function:     6CIT Screen 05/14/2017  What Year? 0 points  What month? 0 points  What time? 0 points  Count back from 20 0 points  Months in reverse 0 points  Repeat phrase 2 points  Total Score 2    Screening Tests Health Maintenance  Topic Date Due  . MAMMOGRAM  03/25/2018 (Originally 12/21/1993)  . DEXA SCAN  03/25/2018 (Originally 12/21/2008)  . COLONOSCOPY  03/25/2018 (Originally 12/21/1993)  . TETANUS/TDAP  03/25/2018 (Originally 12/21/1962)  . Hepatitis C Screening  03/25/2018 (Originally 07/15/1946)  . PNA vac Low Risk Adult (2 of 2 - PCV13) 03/25/2018 (Originally 03/17/2018)  . INFLUENZA VACCINE  06/17/2017      Plan:    I have personally reviewed and addressed the Medicare Annual Wellness questionnaire and  have noted the following in the patient's chart:  A. Medical and social history B. Use of alcohol, tobacco or illicit drugs  C. Current medications and supplements D. Functional ability and status E.  Nutritional status F.  Physical activity G. Advance directives H. List of other physicians I.  Hospitalizations, surgeries, and ER visits in previous 12 months J.  Vitals K. Screenings to include hearing, vision, cognitive, depression L. Referrals and appointments - none  In addition, I have reviewed and discussed with patient certain preventive protocols, quality metrics, and best practice recommendations. A written personalized care plan for preventive services as well as general preventive health recommendations were provided to patient.  See attached scanned questionnaire for additional information.   Signed,   Annetta Maw, RN Nurse Health Advisor   Quick Notes   Health Maintenance: DEXA, TDAP, Hep C, PNA 13 due     Abnormal Screen: 6 Cit-2     Patient Concerns: None     Nurse Concerns: None

## 2017-05-14 NOTE — Patient Instructions (Signed)
[name] , Thank you for taking time to come for your Medicare Wellness Visit. I appreciate your ongoing commitment to your health goals. Please review the following plan we discussed and let me know if I can assist you in the future.   Screening recommendations/referrals: Colonoscopy up to date, long term pt Mammogram up to date, long term pt Bone Density due Recommended yearly ophthalmology/optometry visit for glaucoma screening and checkup Recommended yearly dental visit for hygiene and checkup  Vaccinations: Influenza vaccine up to date Pneumococcal vaccine 13 due, SNF declined Tdap vaccine due, SNF declined Shingles vaccine not in records  Advanced directives: Full code in chart  Conditions/risks identified: None  Next appointment: Dr. Montez Moritaarter makes rounds   Preventive Care 65 Years and Older, Female Preventive care refers to lifestyle choices and visits with your health care provider that can promote health and wellness. What does preventive care include?  A yearly physical exam. This is also called an annual well check.  Dental exams once or twice a year.  Routine eye exams. Ask your health care provider how often you should have your eyes checked.  Personal lifestyle choices, including:  Daily care of your teeth and gums.  Regular physical activity.  Eating a healthy diet.  Avoiding tobacco and drug use.  Limiting alcohol use.  Practicing safe sex.  Taking low-dose aspirin every day.  Taking vitamin and mineral supplements as recommended by your health care provider. What happens during an annual well check? The services and screenings done by your health care provider during your annual well check will depend on your age, overall health, lifestyle risk factors, and family history of disease. Counseling  Your health care provider may ask you questions about your:  Alcohol use.  Tobacco use.  Drug use.  Emotional well-being.  Home and  relationship well-being.  Sexual activity.  Eating habits.  History of falls.  Memory and ability to understand (cognition).  Work and work Astronomerenvironment.  Reproductive health. Screening  You may have the following tests or measurements:  Height, weight, and BMI.  Blood pressure.  Lipid and cholesterol levels. These may be checked every 5 years, or more frequently if you are over 73 years old.  Skin check.  Lung cancer screening. You may have this screening every year starting at age 73 if you have a 30-pack-year history of smoking and currently smoke or have quit within the past 15 years.  Fecal occult blood test (FOBT) of the stool. You may have this test every year starting at age 73.  Flexible sigmoidoscopy or colonoscopy. You may have a sigmoidoscopy every 5 years or a colonoscopy every 10 years starting at age 73.  Hepatitis C blood test.  Hepatitis B blood test.  Sexually transmitted disease (STD) testing.  Diabetes screening. This is done by checking your blood sugar (glucose) after you have not eaten for a while (fasting). You may have this done every 1-3 years.  Bone density scan. This is done to screen for osteoporosis. You may have this done starting at age 73.  Mammogram. This may be done every 1-2 years. Talk to your health care provider about how often you should have regular mammograms. Talk with your health care provider about your test results, treatment options, and if necessary, the need for more tests. Vaccines  Your health care provider may recommend certain vaccines, such as:  Influenza vaccine. This is recommended every year.  Tetanus, diphtheria, and acellular pertussis (Tdap, Td) vaccine. You may need  a Td booster every 10 years.  Zoster vaccine. You may need this after age 73.  Pneumococcal 13-valent conjugate (PCV13) vaccine. One dose is recommended after age 73.  Pneumococcal polysaccharide (PPSV23) vaccine. One dose is recommended after  age 73. Talk to your health care provider about which screenings and vaccines you need and how often you need them. This information is not intended to replace advice given to you by your health care provider. Make sure you discuss any questions you have with your health care provider. Document Released: 11/30/2015 Document Revised: 07/23/2016 Document Reviewed: 09/04/2015 Elsevier Interactive Patient Education  2017 [employer] Prevention in the Home Falls can cause injuries. They can happen to people of all ages. There are many things you can do to make your home safe and to help prevent falls. What can I do on the outside of my home?  Regularly fix the edges of walkways and driveways and fix any cracks.  Remove anything that might make you trip as you walk through a door, such as a raised step or threshold.  Trim any bushes or trees on the path to your home.  Use bright outdoor lighting.  Clear any walking paths of anything that might make someone trip, such as rocks or tools.  Regularly check to see if handrails are loose or broken. Make sure that both sides of any steps have handrails.  Any raised decks and porches should have guardrails on the edges.  Have any leaves, snow, or ice cleared regularly.  Use sand or salt on walking paths during winter.  Clean up any spills in your garage right away. This includes oil or grease spills. What can I do in the bathroom?  Use night lights.  Install grab bars by the toilet and in the tub and shower. Do not use towel bars as grab bars.  Use non-skid mats or decals in the tub or shower.  If you need to sit down in the shower, use a plastic, non-slip stool.  Keep the floor dry. Clean up any water that spills on the floor as soon as it happens.  Remove soap buildup in the tub or shower regularly.  Attach bath mats securely with double-sided non-slip rug tape.  Do not have throw rugs and other things on the floor that can  make you trip. What can I do in the bedroom?  Use night lights.  Make sure that you have a light by your bed that is easy to reach.  Do not use any sheets or blankets that are too big for your bed. They should not hang down onto the floor.  Have a firm chair that has side arms. You can use this for support while you get dressed.  Do not have throw rugs and other things on the floor that can make you trip. What can I do in the kitchen?  Clean up any spills right away.  Avoid walking on wet floors.  Keep items that you use a lot in easy-to-reach places.  If you need to reach something above you, use a strong step stool that has a grab bar.  Keep electrical cords out of the way.  Do not use floor polish or wax that makes floors slippery. If you must use wax, use non-skid floor wax.  Do not have throw rugs and other things on the floor that can make you trip. What can I do with my stairs?  Do not leave any items on the  stairs.  Make sure that there are handrails on both sides of the stairs and use them. Fix handrails that are broken or loose. Make sure that handrails are as long as the stairways.  Check any carpeting to make sure that it is firmly attached to the stairs. Fix any carpet that is loose or worn.  Avoid having throw rugs at the top or bottom of the stairs. If you do have throw rugs, attach them to the floor with carpet tape.  Make sure that you have a light switch at the top of the stairs and the bottom of the stairs. If you do not have them, ask someone to add them for you. What else can I do to help prevent falls?  Wear shoes that:  Do not have high heels.  Have rubber bottoms.  Are comfortable and fit you well.  Are closed at the toe. Do not wear sandals.  If you use a stepladder:  Make sure that it is fully opened. Do not climb a closed stepladder.  Make sure that both sides of the stepladder are locked into place.  Ask someone to hold it for you,  if possible.  Clearly mark and make sure that you can see:  Any grab bars or handrails.  First and last steps.  Where the edge of each step is.  Use tools that help you move around (mobility aids) if they are needed. These include:  Canes.  Walkers.  Scooters.  Crutches.  Turn on the lights when you go into a dark area. Replace any light bulbs as soon as they burn out.  Set up your furniture so you have a clear path. Avoid moving your furniture around.  If any of your floors are uneven, fix them.  If there are any pets around you, be aware of where they are.  Review your medicines with your doctor. Some medicines can make you feel dizzy. This can increase your chance of falling. Ask your doctor what other things that you can do to help prevent falls. This information is not intended to replace advice given to you by your health care provider. Make sure you discuss any questions you have with your health care provider. Document Released: 08/30/2009 Document Revised: 04/10/2016 Document Reviewed: 12/08/2014 Elsevier Interactive Patient Education  2017 [employer].

## 2017-05-15 DIAGNOSIS — E119 Type 2 diabetes mellitus without complications: Secondary | ICD-10-CM | POA: Diagnosis not present

## 2017-05-15 DIAGNOSIS — M6282 Rhabdomyolysis: Secondary | ICD-10-CM | POA: Diagnosis not present

## 2017-05-15 LAB — HM HEPATITIS C SCREENING LAB: HM Hepatitis Screen: NEGATIVE

## 2017-05-18 ENCOUNTER — Encounter: Payer: Self-pay | Admitting: Adult Health

## 2017-05-18 ENCOUNTER — Non-Acute Institutional Stay (SKILLED_NURSING_FACILITY): Payer: Medicare Other | Admitting: Adult Health

## 2017-05-18 DIAGNOSIS — F419 Anxiety disorder, unspecified: Secondary | ICD-10-CM | POA: Diagnosis not present

## 2017-05-18 DIAGNOSIS — K219 Gastro-esophageal reflux disease without esophagitis: Secondary | ICD-10-CM

## 2017-05-18 DIAGNOSIS — M6282 Rhabdomyolysis: Secondary | ICD-10-CM | POA: Diagnosis not present

## 2017-05-18 DIAGNOSIS — G20 Parkinson's disease: Secondary | ICD-10-CM

## 2017-05-18 DIAGNOSIS — F0150 Vascular dementia without behavioral disturbance: Secondary | ICD-10-CM

## 2017-05-18 DIAGNOSIS — I10 Essential (primary) hypertension: Secondary | ICD-10-CM | POA: Diagnosis not present

## 2017-05-18 DIAGNOSIS — F330 Major depressive disorder, recurrent, mild: Secondary | ICD-10-CM

## 2017-05-18 DIAGNOSIS — J45909 Unspecified asthma, uncomplicated: Secondary | ICD-10-CM

## 2017-05-18 DIAGNOSIS — E785 Hyperlipidemia, unspecified: Secondary | ICD-10-CM

## 2017-05-18 DIAGNOSIS — R1311 Dysphagia, oral phase: Secondary | ICD-10-CM

## 2017-05-18 NOTE — Progress Notes (Signed)
Location:   [facility] Room Number: 111 B Place of Service:  SNF (31)   CODE STATUS: Full Code  Allergies  Allergen Reactions  . Hydrocodone Other (See Comments)    Unknown reaction    Chief Complaint  Patient presents with  . Medical Management of Chronic Issues    1 month follow up    HPI:  She is a 71 year old long term resident of this facility being seen for the management of her chronic illnesses. She tells me that she had increased tremors over this past weekend but is better now. She is participating with therapy. There are no nursing concerns at this time.    Past Medical History:  Diagnosis Date  . Allergy   . Anxiety   . Arthritis   . Asthma   . Cataract   . Dementia   . Depression   . Difficulty walking   . Dysphagia, oral phase   . Fibromyalgia   . GERD (gastroesophageal reflux disease)   . Hallucinations   . Hyperlipidemia   . Hypertension   . Lower extremity edema   . Parkinson disease (HCC)   . Repeated falls   . Rhabdomyolysis   . Stroke (HCC)   . TIA (transient ischemic attack)   . Urinary incontinence     Past Surgical History:  Procedure Laterality Date  . ABDOMINAL HYSTERECTOMY    . CATARACT EXTRACTION    . CHOLECYSTECTOMY    . TONSILLECTOMY AND ADENOIDECTOMY      Social History   Social History  . Marital status: Divorced    Spouse name: N/A  . Number of children: N/A  . Years of education: N/A   Occupational History  . Not on file.   Social History Main Topics  . Smoking status: Never Smoker  . Smokeless tobacco: Never Used  . Alcohol use No  . Drug use: No  . Sexual activity: No   Other Topics Concern  . Not on file   Social History Narrative  . No narrative on file   Family History  Problem Relation Age of Onset  . Family history unknown: Yes      VITAL SIGNS BP 117/66   Pulse 70   Temp 97.7 F (36.5 C)   Resp 16   Ht 5\' 5"  (1.651 m)   Wt 150 lb 9.6 oz (68.3 kg)   SpO2 93%   BMI  25.06 kg/m   Patient's Medications  New Prescriptions   No medications on file  Previous Medications   ACETAMINOPHEN (TYLENOL) 500 MG TABLET    Take 1,000 mg by mouth 2 (two) times daily.   ALBUTEROL (PROVENTIL HFA;VENTOLIN HFA) 108 (90 BASE) MCG/ACT INHALER    Inhale 2 puffs into the lungs every 6 (six) hours as needed for wheezing or shortness of breath.   ALUMINUM-MAGNESIUM HYDROXIDE-SIMETHICONE (MAALOX) 200-200-20 MG/5ML SUSP    Take 30 mLs by mouth every 6 (six) hours as needed.   AMLODIPINE (NORVASC) 10 MG TABLET    Take 10 mg by mouth daily.   ATORVASTATIN (LIPITOR) 10 MG TABLET    Take 10 mg by mouth daily.   BISACODYL (DULCOLAX) 10 MG SUPPOSITORY    Place 10 mg rectally daily as needed for moderate constipation.   CARBIDOPA-LEVODOPA (SINEMET IR) 25-100 MG TABLET    Take 1 tablet by mouth 4 (four) times daily.    DONEPEZIL (ARICEPT) 10 MG TABLET    Take 10 mg by mouth at bedtime.  DULOXETINE (CYMBALTA) 30 MG CAPSULE    Give 3 capsules by mouth one time a day   ERGOCALCIFEROL (VITAMIN D2) 50000 UNITS CAPSULE    Take 50,000 Units by mouth once a week. Every Wednesday   FLUTICASONE (FLONASE) 50 MCG/ACT NASAL SPRAY    Place 2 sprays into both nostrils daily.   LORATADINE (CLARITIN) 10 MG TABLET    Take 10 mg by mouth daily.   LORAZEPAM (ATIVAN) 1 MG TABLET    Take 1 mg by mouth every 8 (eight) hours.    MELOXICAM (MOBIC) 7.5 MG TABLET    Take 7.5 mg by mouth daily.   MEMANTINE (NAMENDA) 10 MG TABLET    Take 10 mg by mouth 2 (two) times daily.   NUTRITIONAL SUPPLEMENTS (NUTRITIONAL SUPPLEMENT PO)    House Supplement with meals.  Large portions at meals.   PANTOPRAZOLE (PROTONIX) 40 MG TABLET    Take 40 mg by mouth daily.   POLYETHYLENE GLYCOL (MIRALAX / GLYCOLAX) PACKET    Take 17 g by mouth daily.   TRAMADOL (ULTRAM) 50 MG TABLET    Take 25 mg by mouth 2 (two) times daily.   UNABLE TO FIND    HSG Regular Diet - HSG Mech Soft Texture, Regular consistency  Modified Medications   No  medications on file  Discontinued Medications   No medications on file     SIGNIFICANT DIAGNOSTIC EXAMS  LABS REVIEWED:  03-19-17: wbc 4.7; hgb 14.5; hct 40.0; mcv 88.8; plt 229; glucose 95; bun 21.6; creat 0.55; k+ 3.0; na++ 144; ca 9.7 liver normal albumin 4.5; tsh 2.69; hgb a1c 4.7; chol 121; ldl 56; trig 134; hdl 39    Review of Systems  Constitutional: Negative for malaise/fatigue.  Respiratory: Negative for cough and shortness of breath.   Cardiovascular: Negative for chest pain, palpitations and leg swelling.  Gastrointestinal: negative for constipation . Negative for abdominal pain and heartburn.  Musculoskeletal: Negative for back pain, joint pain and myalgias.  Skin: Negative.   Neurological: Positive for tremors. Negative for dizziness.       Has parkinson disease   Psychiatric/Behavioral: The patient is not nervous/anxious.     Physical Exam  Constitutional: No distress.  Eyes: Conjunctivae are normal.  Neck: Neck supple. No JVD present. No thyromegaly present.  Cardiovascular: Normal rate, regular rhythm and intact distal pulses.   Respiratory: Effort normal and breath sounds normal. No respiratory distress. She has no wheezes.  GI: Soft. Bowel sounds are normal. She exhibits no distension. There is no tenderness.  Musculoskeletal: She exhibits no edema.  Able to move all extremities  Has bilateral hand tremor   Lymphadenopathy:    She has no cervical adenopathy.  Neurological: She is alert.  Skin: Skin is warm and dry. She is not diaphoretic.  Psychiatric: She has a normal mood and affect.   ASSESSMENT/ PLAN:  1. Hypertension: b/p 117/66: will continue norvasc 10 mg daily   2. Dyslipidemia: ldl 56: will continue lipitor 10 mg daily   3.  Asthma with allergic rhinitis: will continue flonase daily; claritin 10 mg daily and albuterol 2 puffs every 6 hours as needed  4. Gerd: will continue protonix 40 mg daily   5. Dysphagia: no signs of aspiration present;  will continue current plan of care  6. Parkinson's disease: will continue sinemet IR 25/100 mg four times daily   7. Vascular dementia: her weight is 150 pounds; will continue to monitor her status.   8. Recurrent major depression with  anxiety: will continue cymbalta 30 mg daily and ativan 1 mg three times daily to help manage her anxiety. She was on lower dose; but wa not effective.    9. Osteoarthritis: will continue mobic 7.5 mg daily and has ultram 50 mg every 6 hours as needed  10. Vit D deficiency:  Will continue vit d 50,000 units weekly   11. Constipation: will begin miralax 17 gm daily   Will check vit D    MD is aware of resident's narcotic use and is in agreement with current plan of care. We will attempt to wean resident as apropriate     Synthia Innocent NP [facility]  Contact [phone] Monday through Friday 8am- 5pm  After hours call [phone]

## 2017-05-19 DIAGNOSIS — Z5181 Encounter for therapeutic drug level monitoring: Secondary | ICD-10-CM | POA: Diagnosis not present

## 2017-05-19 DIAGNOSIS — E559 Vitamin D deficiency, unspecified: Secondary | ICD-10-CM | POA: Diagnosis not present

## 2017-05-19 DIAGNOSIS — G20 Parkinson's disease: Secondary | ICD-10-CM | POA: Diagnosis not present

## 2017-05-19 DIAGNOSIS — Z9181 History of falling: Secondary | ICD-10-CM | POA: Diagnosis not present

## 2017-05-19 DIAGNOSIS — M6282 Rhabdomyolysis: Secondary | ICD-10-CM | POA: Diagnosis not present

## 2017-05-19 LAB — VITAMIN D 25 HYDROXY (VIT D DEFICIENCY, FRACTURES): VIT D 25 HYDROXY: 33.37

## 2017-05-20 DIAGNOSIS — M6282 Rhabdomyolysis: Secondary | ICD-10-CM | POA: Diagnosis not present

## 2017-05-21 DIAGNOSIS — M6282 Rhabdomyolysis: Secondary | ICD-10-CM | POA: Diagnosis not present

## 2017-05-22 DIAGNOSIS — M6282 Rhabdomyolysis: Secondary | ICD-10-CM | POA: Diagnosis not present

## 2017-05-25 DIAGNOSIS — M6282 Rhabdomyolysis: Secondary | ICD-10-CM | POA: Diagnosis not present

## 2017-05-26 DIAGNOSIS — M6282 Rhabdomyolysis: Secondary | ICD-10-CM | POA: Diagnosis not present

## 2017-05-27 DIAGNOSIS — Z9181 History of falling: Secondary | ICD-10-CM | POA: Diagnosis not present

## 2017-05-27 DIAGNOSIS — G20 Parkinson's disease: Secondary | ICD-10-CM | POA: Diagnosis not present

## 2017-05-27 DIAGNOSIS — M6282 Rhabdomyolysis: Secondary | ICD-10-CM | POA: Diagnosis not present

## 2017-05-28 DIAGNOSIS — M6282 Rhabdomyolysis: Secondary | ICD-10-CM | POA: Diagnosis not present

## 2017-05-29 DIAGNOSIS — M6282 Rhabdomyolysis: Secondary | ICD-10-CM | POA: Diagnosis not present

## 2017-06-01 DIAGNOSIS — M6282 Rhabdomyolysis: Secondary | ICD-10-CM | POA: Diagnosis not present

## 2017-06-02 DIAGNOSIS — M6282 Rhabdomyolysis: Secondary | ICD-10-CM | POA: Diagnosis not present

## 2017-06-03 DIAGNOSIS — M6282 Rhabdomyolysis: Secondary | ICD-10-CM | POA: Diagnosis not present

## 2017-06-04 DIAGNOSIS — M6282 Rhabdomyolysis: Secondary | ICD-10-CM | POA: Diagnosis not present

## 2017-06-05 DIAGNOSIS — M6282 Rhabdomyolysis: Secondary | ICD-10-CM | POA: Diagnosis not present

## 2017-06-08 DIAGNOSIS — M6282 Rhabdomyolysis: Secondary | ICD-10-CM | POA: Diagnosis not present

## 2017-06-09 DIAGNOSIS — Z9181 History of falling: Secondary | ICD-10-CM | POA: Diagnosis not present

## 2017-06-09 DIAGNOSIS — G20 Parkinson's disease: Secondary | ICD-10-CM | POA: Diagnosis not present

## 2017-06-09 DIAGNOSIS — M6282 Rhabdomyolysis: Secondary | ICD-10-CM | POA: Diagnosis not present

## 2017-06-10 DIAGNOSIS — M6282 Rhabdomyolysis: Secondary | ICD-10-CM | POA: Diagnosis not present

## 2017-06-11 DIAGNOSIS — M6282 Rhabdomyolysis: Secondary | ICD-10-CM | POA: Diagnosis not present

## 2017-06-12 DIAGNOSIS — M6282 Rhabdomyolysis: Secondary | ICD-10-CM | POA: Diagnosis not present

## 2017-06-15 DIAGNOSIS — M6282 Rhabdomyolysis: Secondary | ICD-10-CM | POA: Diagnosis not present

## 2017-06-16 DIAGNOSIS — M6282 Rhabdomyolysis: Secondary | ICD-10-CM | POA: Diagnosis not present

## 2017-06-17 DIAGNOSIS — M6282 Rhabdomyolysis: Secondary | ICD-10-CM | POA: Diagnosis not present

## 2017-06-18 ENCOUNTER — Encounter: Payer: Self-pay | Admitting: Internal Medicine

## 2017-06-18 ENCOUNTER — Non-Acute Institutional Stay (SKILLED_NURSING_FACILITY): Payer: Medicare Other | Admitting: Internal Medicine

## 2017-06-18 DIAGNOSIS — I10 Essential (primary) hypertension: Secondary | ICD-10-CM | POA: Diagnosis not present

## 2017-06-18 DIAGNOSIS — G20 Parkinson's disease: Secondary | ICD-10-CM | POA: Diagnosis not present

## 2017-06-18 DIAGNOSIS — J45909 Unspecified asthma, uncomplicated: Secondary | ICD-10-CM | POA: Diagnosis not present

## 2017-06-18 DIAGNOSIS — Z8673 Personal history of transient ischemic attack (TIA), and cerebral infarction without residual deficits: Secondary | ICD-10-CM | POA: Diagnosis not present

## 2017-06-18 DIAGNOSIS — M6282 Rhabdomyolysis: Secondary | ICD-10-CM | POA: Diagnosis not present

## 2017-06-18 DIAGNOSIS — F339 Major depressive disorder, recurrent, unspecified: Secondary | ICD-10-CM

## 2017-06-18 DIAGNOSIS — F0150 Vascular dementia without behavioral disturbance: Secondary | ICD-10-CM | POA: Diagnosis not present

## 2017-06-18 DIAGNOSIS — M159 Polyosteoarthritis, unspecified: Secondary | ICD-10-CM | POA: Insufficient documentation

## 2017-06-18 DIAGNOSIS — M150 Primary generalized (osteo)arthritis: Secondary | ICD-10-CM

## 2017-06-18 DIAGNOSIS — Z79899 Other long term (current) drug therapy: Secondary | ICD-10-CM | POA: Diagnosis not present

## 2017-06-18 NOTE — Progress Notes (Signed)
Patient ID: Jennifer CuretSherry Garner, female   DOB: 12/07/1945, 71 y.o.   MRN: [id number]    DATE:  June 18, 2017  Location:   [facility] Room Number: 111 B Place of Service: SNF (31)   Extended Emergency Contact Information Primary Emergency Contact: [name] PaymentMiller,Amy  [place] Home Phone: [phone] Relation: Daughter Secondary Emergency Contact: [name],[name]  [place] Home Phone: [phone] Relation: Daughter  Advanced Directive information Does Patient Have a Medical Advance Directive?: Yes, Type of Advance Directive: Out of facility DNR (pink MOST or yellow form), Pre-existing out of facility DNR order (yellow form or pink MOST form): Pink MOST form placed in chart (order not valid for inpatient use), Does patient want to make changes to medical advance directive?: No - Patient declined  Chief Complaint  Patient presents with  . Medical Management of Chronic Issues    1 month follow up    HPI:  71 yo female long term resident seen today for f/u. She has tremors that are overall improved on QID sinemet. No nursing issues. No falls, appetite ok. sleeps well. No change in bowel/bladder habits. She is a poor historian due to dementia. Hx obtained from chart  Hypertension - BP stable on norvasc 10 mg daily   Dyslipidemia - stable on lipitor 10 mg daily. LDL 56   Asthma with allergic rhinitis - no recent flares on flonase daily; claritin 10 mg daily and albuterol 2 puffs every 6 hours as needed  GERD - stable on protonix 40 mg daily   Parkinson's disease - improved on sinemet IR 25/100 mg four times daily   Vascular dementia - weight stable. She takes aricept for cognition. She gets nutritional supplements per facility protocol  Recurrent major depression with anxiety - mood stable on cymbalta 30 mg daily; ativan 1 mg three times daily. She has tried lower doses of ativan but they were ineffective  Osteoarthritis - stable on mobic 7.5 mg  daily and ultram 50 mg every 6 hours as needed  Vit D deficiency - stable on Vitamin D 50,000 units weekly   Constipation - improved on miralax 17 gm daily     Past Medical History:  Diagnosis Date  . Allergy   . Anxiety   . Arthritis   . Asthma   . Cataract   . Dementia   . Depression   . Difficulty walking   . Dysphagia, oral phase   . Fibromyalgia   . GERD (gastroesophageal reflux disease)   . Hallucinations   . Hyperlipidemia   . Hypertension   . Lower extremity edema   . Parkinson disease (HCC)   . Repeated falls   . Rhabdomyolysis   . Stroke (HCC)   . TIA (transient ischemic attack)   . Urinary incontinence     Past Surgical History:  Procedure Laterality Date  . ABDOMINAL HYSTERECTOMY    . CATARACT EXTRACTION    . CHOLECYSTECTOMY    . TONSILLECTOMY AND ADENOIDECTOMY      No care team member to display  Social History   Social History  . Marital status: Divorced    Spouse name: N/A  . Number of children: N/A  . Years of education: N/A   Occupational History  . Not on file.   Social History Main Topics  . Smoking status: Never Smoker  . Smokeless tobacco: Never Used  . Alcohol use No  . Drug use: No  . Sexual activity: No   Other Topics Concern  .  Not on file   Social History Narrative  . No narrative on file     reports that she has never smoked. She has never used smokeless tobacco. She reports that she does not drink alcohol or use drugs.  Family History  Problem Relation Age of Onset  . Family history unknown: Yes   No family status information on file.    Immunization History  Administered Date(s) Administered  . Influenza-Unspecified 03/17/2017  . PPD Test 03/24/2017  . Pneumococcal Polysaccharide-23 03/17/2017    Allergies  Allergen Reactions  . Hydrocodone Other (See Comments)    Unknown reaction    Medications: Patient's Medications  New Prescriptions   No medications on file  Previous Medications    ACETAMINOPHEN (TYLENOL) 500 MG TABLET    Take 1,000 mg by mouth 2 (two) times daily.   ALBUTEROL (PROVENTIL HFA;VENTOLIN HFA) 108 (90 BASE) MCG/ACT INHALER    Inhale 2 puffs into the lungs every 6 (six) hours as needed for wheezing or shortness of breath.   ALUMINUM-MAGNESIUM HYDROXIDE-SIMETHICONE (MAALOX) 200-200-20 MG/5ML SUSP    Take 30 mLs by mouth every 6 (six) hours as needed.   AMLODIPINE (NORVASC) 10 MG TABLET    Take 10 mg by mouth daily.   ATORVASTATIN (LIPITOR) 10 MG TABLET    Take 10 mg by mouth daily.   BISACODYL (DULCOLAX) 10 MG SUPPOSITORY    Place 10 mg rectally daily as needed for moderate constipation.   CARBIDOPA-LEVODOPA (SINEMET IR) 25-100 MG TABLET    Take 1 tablet by mouth 4 (four) times daily.    CHOLECALCIFEROL (VITAMIN D) 1000 UNITS TABLET    Take 1,000 Units by mouth daily.   DONEPEZIL (ARICEPT) 10 MG TABLET    Take 10 mg by mouth at bedtime.   DULOXETINE (CYMBALTA) 30 MG CAPSULE    Give 3 capsules by mouth one time a day   FLUTICASONE (FLONASE) 50 MCG/ACT NASAL SPRAY    Place 2 sprays into both nostrils daily.   LORATADINE (CLARITIN) 10 MG TABLET    Take 10 mg by mouth daily.   LORAZEPAM (ATIVAN) 1 MG TABLET    Take 1 mg by mouth every 8 (eight) hours.    MELOXICAM (MOBIC) 7.5 MG TABLET    Take 7.5 mg by mouth daily.   MEMANTINE (NAMENDA) 10 MG TABLET    Take 10 mg by mouth 2 (two) times daily.   NUTRITIONAL SUPPLEMENTS (NUTRITIONAL SUPPLEMENT PO)    House Supplement with meals.  Large portions at meals.   ONDANSETRON (ZOFRAN) 4 MG TABLET    Take 4 mg by mouth every 6 (six) hours as needed for nausea or vomiting.   PANTOPRAZOLE (PROTONIX) 40 MG TABLET    Take 40 mg by mouth daily.   POLYETHYLENE GLYCOL (MIRALAX / GLYCOLAX) PACKET    Take 17 g by mouth daily.   TRAMADOL (ULTRAM) 50 MG TABLET    Take 25 mg by mouth 2 (two) times daily.   UNABLE TO FIND    HSG Regular Diet - HSG Mech Soft Texture, Regular consistency  Modified Medications   No medications on file    Discontinued Medications   No medications on file    Review of Systems  Unable to perform ROS: Dementia    Vitals:   06/18/17 1302  BP: 126/68  Pulse: 76  Resp: 16  Temp: 98 F (36.7 C)  SpO2: 94%  Weight: 153 lb 3.2 oz (69.5 kg)  Height: 5\' 5"  (1.651 m)  Body mass index is 25.49 kg/m.  Physical Exam  Constitutional: She appears well-developed.  Frail appearing in NAD, siting on edge of bed  HENT:  Mouth/Throat: Oropharynx is clear and moist. No oropharyngeal exudate.  MMM; no oral thrush  Eyes: Pupils are equal, round, and reactive to light. No scleral icterus.  Neck: Neck supple. Carotid bruit is not present. No tracheal deviation present. No thyromegaly present.  Cardiovascular: Normal rate, regular rhythm and intact distal pulses.  Exam reveals no gallop and no friction rub.   Murmur (1/6 SEM) heard. No LE edema b/l. no calf TTP.   Pulmonary/Chest: Effort normal and breath sounds normal. No stridor. No respiratory distress. She has no wheezes. She has no rales.  Abdominal: Soft. Normal appearance and bowel sounds are normal. She exhibits no distension and no mass. There is no hepatomegaly. There is no tenderness. There is no rigidity, no rebound and no guarding. No hernia.  Musculoskeletal: She exhibits edema.  Lymphadenopathy:    She has no cervical adenopathy.  Neurological: She is alert. She displays tremor (resting). She exhibits abnormal muscle tone.  Cogwheel rigidity noted  Skin: Skin is warm and dry. No rash noted.  Psychiatric: She has a normal mood and affect. Her behavior is normal. Thought content normal.     Labs reviewed: Abstract on 05/22/2017  Component Date Value Ref Range Status  . Vit D, 25-Hydroxy 05/19/2017 33.37   Final  Nursing Home on 03/25/2017  Component Date Value Ref Range Status  . Hemoglobin 03/19/2017 14.5  12.0 - 16.0 g/dL Final  . HCT 40/34/742505/01/2017 40  36 - 46 % Final  . Neutrophils Absolute 03/19/2017 2  /L Final  . WBC  03/19/2017 4.7  10^3/mL Final  . Glucose 03/19/2017 95  mg/dL Final  . BUN 95/63/875605/01/2017 12  4 - 21 mg/dL Final  . Creatinine 43/32/951805/01/2017 0.6  0.5 - 1.1 mg/dL Final  . Potassium 84/16/606305/01/2017 3.0* 3.4 - 5.3 mmol/L Final  . Sodium 03/19/2017 144  137 - 147 mmol/L Final  . Triglycerides 03/19/2017 134  40 - 160 mg/dL Final  . Cholesterol 01/60/109305/01/2017 121  0 - 200 mg/dL Final  . HDL 23/55/732205/01/2017 39  35 - 70 mg/dL Final  . LDL Cholesterol 03/19/2017 56  mg/dL Final  . Alkaline Phosphatase 03/19/2017 111  25 - 125 U/L Final  . ALT 03/19/2017 10  7 - 35 U/L Final  . AST 03/19/2017 14  13 - 35 U/L Final  . Bilirubin, Total 03/19/2017 0.7  mg/dL Final  . Hemoglobin G2RA1C 03/19/2017 4.7   Final  . TSH 03/19/2017 2.49  0.41 - 5.90 uIU/mL Final  Abstract on 03/24/2017  Component Date Value Ref Range Status  . Hemoglobin 03/19/2017 14.5  12.0 - 16.0 g/dL Final  . HCT 42/70/623705/01/2017 40  36 - 46 % Final  . Neutrophils Absolute 03/19/2017 2  /L Final  . Platelets 03/19/2017 229  150 - 399 K/L Final  . WBC 03/19/2017 4.7  10^3/mL Final    No results found.   Assessment/Plan   ICD-10-CM   1. Parkinson disease (HCC) - improving but still not controlled G20   2. Vascular dementia without behavioral disturbance F01.50   3. Benign essential hypertension I10   4. Recurrent major depressive disorder, remission status unspecified (HCC) F33.9   5. Asthma with allergic rhinitis without complication, unspecified asthma severity, unspecified whether persistent J45.909   6. History of CVA in adulthood 9Z86.73   7. High risk medication use Z79.899  Refer to neurology for Parkinson's mx  Check CMP due to high risk med use  Cont other meds as ordered  Psych services to follow  Cont nutritional supplements as ordered  PT/OT/ST as indicated  Will follow   Porter Nakama S. Ancil Linsey  [facility] [address] [place], [place] [address] [phone] Cell  (Monday-Friday 8 AM - 5 PM) [phone] After 5 PM and follow prompts

## 2017-06-19 DIAGNOSIS — M6282 Rhabdomyolysis: Secondary | ICD-10-CM | POA: Diagnosis not present

## 2017-06-19 DIAGNOSIS — Z5181 Encounter for therapeutic drug level monitoring: Secondary | ICD-10-CM | POA: Diagnosis not present

## 2017-06-19 DIAGNOSIS — E119 Type 2 diabetes mellitus without complications: Secondary | ICD-10-CM | POA: Diagnosis not present

## 2017-06-19 LAB — BASIC METABOLIC PANEL
BUN: 17 (ref 4–21)
Creatinine: 0.7 (ref 0.5–1.1)
GLUCOSE: 122
Potassium: 4 (ref 3.4–5.3)
SODIUM: 141 (ref 137–147)

## 2017-06-19 LAB — HEPATIC FUNCTION PANEL
ALK PHOS: 125 (ref 25–125)
ALT: 7 (ref 7–35)
AST: 18 (ref 13–35)
BILIRUBIN, TOTAL: 0.6

## 2017-06-22 DIAGNOSIS — M6282 Rhabdomyolysis: Secondary | ICD-10-CM | POA: Diagnosis not present

## 2017-06-23 DIAGNOSIS — M6282 Rhabdomyolysis: Secondary | ICD-10-CM | POA: Diagnosis not present

## 2017-06-24 DIAGNOSIS — M6282 Rhabdomyolysis: Secondary | ICD-10-CM | POA: Diagnosis not present

## 2017-06-25 DIAGNOSIS — M6282 Rhabdomyolysis: Secondary | ICD-10-CM | POA: Diagnosis not present

## 2017-06-26 DIAGNOSIS — M6282 Rhabdomyolysis: Secondary | ICD-10-CM | POA: Diagnosis not present

## 2017-06-29 DIAGNOSIS — M6282 Rhabdomyolysis: Secondary | ICD-10-CM | POA: Diagnosis not present

## 2017-06-30 DIAGNOSIS — M6282 Rhabdomyolysis: Secondary | ICD-10-CM | POA: Diagnosis not present

## 2017-07-01 ENCOUNTER — Other Ambulatory Visit: Payer: Self-pay

## 2017-07-01 DIAGNOSIS — M6282 Rhabdomyolysis: Secondary | ICD-10-CM | POA: Diagnosis not present

## 2017-07-01 MED ORDER — LORAZEPAM 1 MG PO TABS: 1.0000 mg | ORAL_TABLET | Freq: Three times a day (TID) | ORAL | 0 refills | Status: DC

## 2017-07-01 NOTE — Telephone Encounter (Signed)
RX faxed to [facility] @ [phone], phone number [phone] 

## 2017-07-02 DIAGNOSIS — M6282 Rhabdomyolysis: Secondary | ICD-10-CM | POA: Diagnosis not present

## 2017-07-03 DIAGNOSIS — M6282 Rhabdomyolysis: Secondary | ICD-10-CM | POA: Diagnosis not present

## 2017-07-06 DIAGNOSIS — M6282 Rhabdomyolysis: Secondary | ICD-10-CM | POA: Diagnosis not present

## 2017-07-07 DIAGNOSIS — M6282 Rhabdomyolysis: Secondary | ICD-10-CM | POA: Diagnosis not present

## 2017-07-08 DIAGNOSIS — M6282 Rhabdomyolysis: Secondary | ICD-10-CM | POA: Diagnosis not present

## 2017-07-09 DIAGNOSIS — R2689 Other abnormalities of gait and mobility: Secondary | ICD-10-CM | POA: Diagnosis not present

## 2017-07-09 DIAGNOSIS — M6282 Rhabdomyolysis: Secondary | ICD-10-CM | POA: Diagnosis not present

## 2017-07-09 DIAGNOSIS — G20 Parkinson's disease: Secondary | ICD-10-CM | POA: Diagnosis not present

## 2017-07-10 DIAGNOSIS — M6282 Rhabdomyolysis: Secondary | ICD-10-CM | POA: Diagnosis not present

## 2017-07-13 ENCOUNTER — Other Ambulatory Visit: Payer: Self-pay

## 2017-07-13 DIAGNOSIS — M6282 Rhabdomyolysis: Secondary | ICD-10-CM | POA: Diagnosis not present

## 2017-07-13 MED ORDER — TRAMADOL HCL 50 MG PO TABS: 25.0000 mg | ORAL_TABLET | Freq: Two times a day (BID) | ORAL | 0 refills | Status: DC

## 2017-07-13 NOTE — Telephone Encounter (Signed)
RX faxed to [facility] @ [phone], phone number [phone] 

## 2017-07-14 DIAGNOSIS — M6282 Rhabdomyolysis: Secondary | ICD-10-CM | POA: Diagnosis not present

## 2017-07-15 DIAGNOSIS — M6282 Rhabdomyolysis: Secondary | ICD-10-CM | POA: Diagnosis not present

## 2017-07-16 DIAGNOSIS — M6282 Rhabdomyolysis: Secondary | ICD-10-CM | POA: Diagnosis not present

## 2017-07-17 DIAGNOSIS — M6282 Rhabdomyolysis: Secondary | ICD-10-CM | POA: Diagnosis not present

## 2017-07-20 DIAGNOSIS — M6282 Rhabdomyolysis: Secondary | ICD-10-CM | POA: Diagnosis not present

## 2017-07-21 DIAGNOSIS — M6282 Rhabdomyolysis: Secondary | ICD-10-CM | POA: Diagnosis not present

## 2017-07-22 DIAGNOSIS — M6282 Rhabdomyolysis: Secondary | ICD-10-CM | POA: Diagnosis not present

## 2017-07-23 ENCOUNTER — Encounter: Payer: Self-pay | Admitting: Adult Health

## 2017-07-23 ENCOUNTER — Non-Acute Institutional Stay (SKILLED_NURSING_FACILITY): Payer: Medicare Other | Admitting: Adult Health

## 2017-07-23 DIAGNOSIS — K219 Gastro-esophageal reflux disease without esophagitis: Secondary | ICD-10-CM | POA: Diagnosis not present

## 2017-07-23 DIAGNOSIS — J45909 Unspecified asthma, uncomplicated: Secondary | ICD-10-CM

## 2017-07-23 DIAGNOSIS — R1311 Dysphagia, oral phase: Secondary | ICD-10-CM | POA: Diagnosis not present

## 2017-07-23 DIAGNOSIS — I10 Essential (primary) hypertension: Secondary | ICD-10-CM

## 2017-07-23 DIAGNOSIS — E785 Hyperlipidemia, unspecified: Secondary | ICD-10-CM

## 2017-07-23 DIAGNOSIS — M6282 Rhabdomyolysis: Secondary | ICD-10-CM | POA: Diagnosis not present

## 2017-07-23 NOTE — Progress Notes (Signed)
Location:   [facility] Room Number: 111 B Place of Service:  SNF (31)   CODE STATUS: Full Code  Allergies  Allergen Reactions  . Hydrocodone Other (See Comments)    Unknown reaction    Chief Complaint  Patient presents with  . Medical Management of Chronic Issues    hypertension; asthma; gerd; dysphagia; dyslipidemia     HPI:  She is a 71 year old long term resident of this facility being seen for the management of her chronic illnesses hypertension; asthma; dysphagia; gerd; dyslipidemia . She is complaining of itching and mild sinus congestion telling me that benadryl will help manage these symptoms. She is also complaining of a headache starting at the back of her neck. Tylenol does not relieve her pain; therapy feels as though she has muscle tension present in the back of her neck. She would like to try biofreeze to her neck.   Past Medical History:  Diagnosis Date  . Allergy   . Anxiety   . Arthritis   . Asthma   . Cataract   . Dementia   . Depression   . Difficulty walking   . Dysphagia, oral phase   . Fibromyalgia   . GERD (gastroesophageal reflux disease)   . Hallucinations   . Hyperlipidemia   . Hypertension   . Lower extremity edema   . Parkinson disease (HCC)   . Repeated falls   . Rhabdomyolysis   . Stroke (HCC)   . TIA (transient ischemic attack)   . Urinary incontinence     Past Surgical History:  Procedure Laterality Date  . ABDOMINAL HYSTERECTOMY    . CATARACT EXTRACTION    . CHOLECYSTECTOMY    . TONSILLECTOMY AND ADENOIDECTOMY      Social History   Social History  . Marital status: Divorced    Spouse name: N/A  . Number of children: N/A  . Years of education: N/A   Occupational History  . Not on file.   Social History Main Topics  . Smoking status: Never Smoker  . Smokeless tobacco: Never Used  . Alcohol use No  . Drug use: No  . Sexual activity: No   Other Topics Concern  . Not on file   Social History  Narrative  . No narrative on file   Family History  Problem Relation Age of Onset  . Family history unknown: Yes      VITAL SIGNS BP 124/74   Pulse 80   Temp 98.4 F (36.9 C)   Resp 18   Ht 5\' 5"  (1.651 m)   Wt 167 lb (75.8 kg)   SpO2 98%   BMI 27.79 kg/m   Patient's Medications  New Prescriptions   No medications on file  Previous Medications   ACETAMINOPHEN (TYLENOL) 500 MG TABLET    Take 1,000 mg by mouth 2 (two) times daily.   ALBUTEROL (PROVENTIL HFA;VENTOLIN HFA) 108 (90 BASE) MCG/ACT INHALER    Inhale 2 puffs into the lungs every 6 (six) hours as needed for wheezing or shortness of breath.   ALUMINUM-MAGNESIUM HYDROXIDE-SIMETHICONE (MAALOX) 200-200-20 MG/5ML SUSP    Take 30 mLs by mouth every 6 (six) hours as needed.   AMLODIPINE (NORVASC) 10 MG TABLET    Take 10 mg by mouth daily.   ATORVASTATIN (LIPITOR) 10 MG TABLET    Take 10 mg by mouth daily.   CARBIDOPA-LEVODOPA (SINEMET IR) 25-100 MG TABLET    Take 1 tablet by mouth 4 (four) times daily.  CHOLECALCIFEROL (VITAMIN D) 1000 UNITS TABLET    Take 1,000 Units by mouth daily.   DONEPEZIL (ARICEPT) 10 MG TABLET    Take 10 mg by mouth at bedtime.   DULOXETINE (CYMBALTA) 30 MG CAPSULE    Give 3 capsules by mouth one time a day   FLUTICASONE (FLONASE) 50 MCG/ACT NASAL SPRAY    Place 2 sprays into both nostrils daily.   LORATADINE (CLARITIN) 10 MG TABLET    Take 10 mg by mouth daily.   LORAZEPAM (ATIVAN) 1 MG TABLET    Take 1 tablet (1 mg total) by mouth every 8 (eight) hours.   MELOXICAM (MOBIC) 7.5 MG TABLET    Take 7.5 mg by mouth daily.   MEMANTINE (NAMENDA) 10 MG TABLET    Take 10 mg by mouth 2 (two) times daily.   NUTRITIONAL SUPPLEMENTS (NUTRITIONAL SUPPLEMENT PO)    House Supplement with meals.  Large portions at meals.   ONDANSETRON (ZOFRAN) 4 MG TABLET    Take 4 mg by mouth every 6 (six) hours as needed for nausea or vomiting.   PANTOPRAZOLE (PROTONIX) 40 MG TABLET    Take 40 mg by mouth daily.   POLYETHYLENE  GLYCOL (MIRALAX / GLYCOLAX) PACKET    Take 17 g by mouth daily.   TRAMADOL (ULTRAM) 50 MG TABLET    Take 0.5 tablets (25 mg total) by mouth 2 (two) times daily.   UNABLE TO FIND    HSG Regular Diet - HSG Mech Soft Texture, Regular consistency  Modified Medications   No medications on file  Discontinued Medications   BISACODYL (DULCOLAX) 10 MG SUPPOSITORY    Place 10 mg rectally daily as needed for moderate constipation.     SIGNIFICANT DIAGNOSTIC EXAMS  NO NEW EXAMS  LABS REVIEWED:PREVIOUS   03-19-17: wbc 4.7; hgb 14.5; hct 40.0; mcv 88.8; plt 229; glucose 95; bun 21.6; creat 0.55; k+ 3.0; na++ 144; ca 9.7 liver normal albumin 4.5; tsh 2.69; hgb a1c 4.7; chol 121; ldl 56; trig 134; hdl 39   TODAY   Vit D 33.37 06-19-17: glucose 122; bun 17.3; creat 0.71; k+ 4.0; na++ 141; ca 9.4; liver normal 4.8    Review of Systems  Constitutional: Negative for malaise/fatigue.  Respiratory: Negative for cough and shortness of breath.   Cardiovascular: Negative for chest pain, palpitations and leg swelling.  Gastrointestinal: Positive for constipation. Negative for abdominal pain and heartburn.  Musculoskeletal: Negative for back pain, joint pain and myalgias.  Skin: Positive for itching.  Neurological: Positive for tremors and headaches. Negative for dizziness.       Has parkinson disease   Psychiatric/Behavioral: The patient is not nervous/anxious.    Physical Exam  Constitutional: No distress.  Eyes: Conjunctivae are normal.  Neck: Neck supple. No JVD present. No thyromegaly present.  Cardiovascular: Normal rate, regular rhythm and intact distal pulses.   Respiratory: Effort normal and breath sounds normal. No respiratory distress. She has no wheezes.  GI: Soft. Bowel sounds are normal. She exhibits no distension. There is no tenderness.  Musculoskeletal: She exhibits no edema.  Able to move all extremities  Has bilateral hand tremor   Lymphadenopathy:    She has no cervical adenopathy.   Neurological: She is alert.  Skin: Skin is warm and dry. She is not diaphoretic.  Psychiatric: She has a normal mood and affect.     ASSESSMENT/ PLAN:  TODAY:   1. Hypertension: stable b/p 124/74: will continue norvasc 10 mg daily   2. Dyslipidemia:  stable  ldl 56: will continue lipitor 10 mg daily   3.  Asthma with allergic rhinitis:without change  will continue flonase daily; claritin 10 mg daily and albuterol 2 puffs every 6 hours as needed  4. Gerd: stable  will continue protonix 40 mg daily   5. Dysphagia: no signs of aspiration present; will continue current plan of care  PREVIOUS   6. Parkinson's disease: without change will continue sinemet IR 25/100 mg four times daily   7. Vascular dementia: stable her weight is 167 pounds; will continue to monitor her status.   8. Recurrent major depression with anxiety: stable will continue cymbalta 30 mg daily and ativan 1 mg three times daily to help manage her anxiety. She was on lower dose; but was not effective.    9. Osteoarthritis: stable will continue mobic 7.5 mg daily and has ultram 50 mg every 6 hours as needed will use biofreeze twice daily to posterior neck   10. Vit D deficiency:  Will continue vit d 50,000 units weekly   11. Constipation: will begin miralax 17 gm daily   Will check vit D    MD is aware of resident's narcotic use and is in agreement with current plan of care. We will attempt to wean resident as apropriate     Synthia Innocent NP [facility]  Contact [phone] Monday through Friday 8am- 5pm  After hours call [phone]

## 2017-07-24 DIAGNOSIS — M6282 Rhabdomyolysis: Secondary | ICD-10-CM | POA: Diagnosis not present

## 2017-07-27 DIAGNOSIS — M6282 Rhabdomyolysis: Secondary | ICD-10-CM | POA: Diagnosis not present

## 2017-07-28 DIAGNOSIS — M6282 Rhabdomyolysis: Secondary | ICD-10-CM | POA: Diagnosis not present

## 2017-07-30 DIAGNOSIS — R2689 Other abnormalities of gait and mobility: Secondary | ICD-10-CM | POA: Diagnosis not present

## 2017-07-30 DIAGNOSIS — G20 Parkinson's disease: Secondary | ICD-10-CM | POA: Diagnosis not present

## 2017-08-03 DIAGNOSIS — R2689 Other abnormalities of gait and mobility: Secondary | ICD-10-CM | POA: Diagnosis not present

## 2017-08-03 DIAGNOSIS — G20 Parkinson's disease: Secondary | ICD-10-CM | POA: Diagnosis not present

## 2017-08-06 ENCOUNTER — Other Ambulatory Visit: Payer: Self-pay

## 2017-08-06 MED ORDER — LORAZEPAM 1 MG PO TABS: 1.0000 mg | ORAL_TABLET | Freq: Three times a day (TID) | ORAL | 0 refills | Status: DC

## 2017-08-06 NOTE — Telephone Encounter (Signed)
RX faxed to [facility] @ [phone], phone number [phone] 

## 2017-08-07 ENCOUNTER — Other Ambulatory Visit: Payer: Self-pay

## 2017-08-07 MED ORDER — LORAZEPAM 1 MG PO TABS: 1.0000 mg | ORAL_TABLET | Freq: Three times a day (TID) | ORAL | 0 refills | Status: DC

## 2017-08-07 NOTE — Telephone Encounter (Signed)
RX faxed to [facility] @ [phone], phone number [phone] 

## 2017-08-18 ENCOUNTER — Encounter: Payer: Self-pay | Admitting: Adult Health

## 2017-08-18 ENCOUNTER — Telehealth: Payer: Self-pay

## 2017-08-18 ENCOUNTER — Non-Acute Institutional Stay (SKILLED_NURSING_FACILITY): Payer: Medicare Other | Admitting: Adult Health

## 2017-08-18 DIAGNOSIS — F330 Major depressive disorder, recurrent, mild: Secondary | ICD-10-CM | POA: Diagnosis not present

## 2017-08-18 DIAGNOSIS — M150 Primary generalized (osteo)arthritis: Secondary | ICD-10-CM | POA: Diagnosis not present

## 2017-08-18 DIAGNOSIS — E785 Hyperlipidemia, unspecified: Secondary | ICD-10-CM | POA: Diagnosis not present

## 2017-08-18 DIAGNOSIS — E559 Vitamin D deficiency, unspecified: Secondary | ICD-10-CM | POA: Diagnosis not present

## 2017-08-18 DIAGNOSIS — F0150 Vascular dementia without behavioral disturbance: Secondary | ICD-10-CM | POA: Diagnosis not present

## 2017-08-18 DIAGNOSIS — G20 Parkinson's disease: Secondary | ICD-10-CM

## 2017-08-18 DIAGNOSIS — M159 Polyosteoarthritis, unspecified: Secondary | ICD-10-CM

## 2017-08-18 NOTE — Telephone Encounter (Signed)
I called pt to r/s her appt with Dr. Frances Furbish on 08/19/17. I spent 15 mins on hold with [facility]. I was finally able to speak with Lindell Noe, staff member at [facility], and advised her that pt's appt on 08/19/2017 with Dr. Frances Furbish at 2:30pm will need to be rescheduled. A new appt for pt with Dr. Terrace Arabia was scheduled for 08/20/2017 at 10:00 and Lavonne verbalized understanding that pt should be here at 9:30am for the 10:00 appt on 08/20/2017 with Dr. Terrace Arabia.

## 2017-08-18 NOTE — Progress Notes (Signed)
Location:   [facility] Room Number: 111 B  Place of Service:  SNF (31)   CODE STATUS: Full Code  Allergies  Allergen Reactions  . Hydrocodone Other (See Comments)    Unknown reaction    Chief Complaint  Patient presents with  . Medical Management of Chronic Issues     parkinson disease; dementia; major depression; vit d def; osteoarthritis; constipation.     HPI:  She is a 71 year old long term resident of this facility being seen for the management of her chronic illnesses: parkinson disease; dementia; major depression; vit d deficiency and constipation. She is not voicing any concerns at this time. She denies any back pain; constipation; no signs of depression present. There are no nursing concerns at this time.   Past Medical History:  Diagnosis Date  . Allergy   . Anxiety   . Arthritis   . Asthma   . Cataract   . Dementia   . Depression   . Difficulty walking   . Dysphagia, oral phase   . Fibromyalgia   . GERD (gastroesophageal reflux disease)   . Hallucinations   . Hyperlipidemia   . Hypertension   . Lower extremity edema   . Parkinson disease (HCC)   . Repeated falls   . Rhabdomyolysis   . Stroke (HCC)   . TIA (transient ischemic attack)   . Urinary incontinence     Past Surgical History:  Procedure Laterality Date  . ABDOMINAL HYSTERECTOMY    . CATARACT EXTRACTION    . CHOLECYSTECTOMY    . TONSILLECTOMY AND ADENOIDECTOMY      Social History   Social History  . Marital status: Divorced    Spouse name: N/A  . Number of children: N/A  . Years of education: N/A   Occupational History  . Not on file.   Social History Main Topics  . Smoking status: Never Smoker  . Smokeless tobacco: Never Used  . Alcohol use No  . Drug use: No  . Sexual activity: No   Other Topics Concern  . Not on file   Social History Narrative  . No narrative on file   Family History  Problem Relation Age of Onset  . Family history unknown: Yes       VITAL SIGNS BP 126/64   Pulse 78   Temp 97.7 F (36.5 C)   Resp 18   Ht  (1.651 m)   Wt 166 lb 9.6 oz (75.6 kg)   SpO2 97%   BMI 27.72 kg/m   Patient's Medications  New Prescriptions   No medications on file  Previous Medications   ACETAMINOPHEN (TYLENOL) 500 MG TABLET    Take 1,000 mg by mouth 2 (two) times daily.   ALBUTEROL (PROVENTIL HFA;VENTOLIN HFA) 108 (90 BASE) MCG/ACT INHALER    Inhale 2 puffs into the lungs every 6 (six) hours as needed for wheezing or shortness of breath.   ALUMINUM-MAGNESIUM HYDROXIDE-SIMETHICONE (MAALOX) 200-200-20 MG/5ML SUSP    Take 30 mLs by mouth every 6 (six) hours as needed.   AMLODIPINE (NORVASC) 10 MG TABLET    Take 10 mg by mouth daily.   ATORVASTATIN (LIPITOR) 10 MG TABLET    Take 10 mg by mouth daily.   CARBIDOPA-LEVODOPA (SINEMET IR) 25-100 MG TABLET    Take 1 tablet by mouth 4 (four) times daily.    CHOLECALCIFEROL (VITAMIN D) 1000 UNITS TABLET    Take 1,000 Units by mouth daily.   DIPHENHYDRAMINE (BENADRYL)  25 MG TABLET    Take 25 mg by mouth every 8 (eight) hours as needed for itching.   DONEPEZIL (ARICEPT) 10 MG TABLET    Take 10 mg by mouth at bedtime.   DULOXETINE (CYMBALTA) 30 MG CAPSULE    Give 3 capsules by mouth one time a day   FLUTICASONE (FLONASE) 50 MCG/ACT NASAL SPRAY    Place 2 sprays into both nostrils daily.   LORATADINE (CLARITIN) 10 MG TABLET    Take 10 mg by mouth daily.   LORAZEPAM (ATIVAN) 1 MG TABLET    Take 1 tablet (1 mg total) by mouth every 8 (eight) hours.   MELOXICAM (MOBIC) 7.5 MG TABLET    Take 7.5 mg by mouth daily.   MEMANTINE (NAMENDA) 10 MG TABLET    Take 10 mg by mouth 2 (two) times daily.   MENTHOL, TOPICAL ANALGESIC, (BIOFREEZE) 4 % GEL    Apply to posterior neck topically tow times daily for neck pain   NUTRITIONAL SUPPLEMENTS (NUTRITIONAL SUPPLEMENT PO)    House Supplement with meals.  Large portions at meals.   ONDANSETRON (ZOFRAN) 4 MG TABLET    Take 4 mg by mouth every 6 (six) hours  as needed for nausea or vomiting.   PANTOPRAZOLE (PROTONIX) 40 MG TABLET    Take 40 mg by mouth daily.   POLYETHYLENE GLYCOL (MIRALAX / GLYCOLAX) PACKET    Take 17 g by mouth daily.   TRAMADOL (ULTRAM) 50 MG TABLET    Take 0.5 tablets (25 mg total) by mouth 2 (two) times daily.   UNABLE TO FIND    HSG Regular Diet - HSG Mech Soft Texture, Regular consistency  Modified Medications   No medications on file  Discontinued Medications   No medications on file     SIGNIFICANT DIAGNOSTIC EXAMS  NO NEW EXAMS  LABS REVIEWED:PREVIOUS   03-19-17: wbc 4.7; hgb 14.5; hct 40.0; mcv 88.8; plt 229; glucose 95; bun 21.6; creat 0.55; k+ 3.0; na++ 144; ca 9.7 liver normal albumin 4.5; tsh 2.69; hgb a1c 4.7; chol 121; ldl 56; trig 134; hdl 39  4-0-98: Vit D 33.37 06-19-17: glucose 122; bun 17.3; creat 0.71; k+ 4.0; na++ 141; ca 9.4; liver normal 4.8  NO NEW LABS     Review of Systems  Constitutional: Negative for malaise/fatigue.  Respiratory: Negative for cough and shortness of breath.   Cardiovascular: Negative for chest pain, palpitations and leg swelling.  Gastrointestinal: Negative for abdominal pain, constipation and heartburn.  Musculoskeletal: Negative for back pain, joint pain and myalgias.  Skin: Negative.   Neurological: Negative for dizziness.  Psychiatric/Behavioral: The patient is not nervous/anxious.      Physical Exam  Constitutional: She appears well-developed and well-nourished. No distress.  HENT:  Head: Normocephalic.  Eyes: Conjunctivae are normal.  Neck: Neck supple. No thyromegaly present.  Pulmonary/Chest: Effort normal and breath sounds normal. No respiratory distress. She has no wheezes.  Abdominal: Soft. Bowel sounds are normal. She exhibits no distension. There is no tenderness.  Musculoskeletal: Normal range of motion. She exhibits no edema.  Has bilateral hand tremor   Lymphadenopathy:    She has no cervical adenopathy.  Neurological: She is alert.  Skin: Skin  is warm and dry. She is not diaphoretic.  Psychiatric: She has a normal mood and affect.    ASSESSMENT/ PLAN:  TODAY:   1. Parkinson's disease: without change will continue sinemet IR 25/100 mg four times daily   2. Vascular dementia: stable her weight is  166 pounds; will continue namenda 10 mg twice daily and aricept 10 mg daily  will continue to monitor her status.   3. Recurrent major depression with anxiety: stable will continue cymbalta 90 mg daily and ativan 1 mg three times daily to help manage her anxiety. She was on lower dose; but was not effective.    4. Osteoarthritis: stable will continue mobic 7.5 mg daily and has ultram 25 mg twice daily and will continue biofreeze twice daily to posterior neck   5. Vit D deficiency: stable level 33.37  Will continue vit d 1000 units daily   6. Constipation: stable will continue  miralax 17 gm daily    PREVIOUS   7. Hypertension: stable b/p 124/74: will continue norvasc 10 mg daily   8. Dyslipidemia: stable  ldl 56: will continue lipitor 10 mg daily   9.  Asthma with allergic rhinitis:without change  will continue flonase daily; claritin 10 mg daily and albuterol 2 puffs every 6 hours as needed  10. Gerd: stable  will continue protonix 40 mg daily   11. Dysphagia: no signs of aspiration present; will continue current plan of care  MD is aware of resident's narcotic use and is in agreement with current plan of care. We will attempt to wean resident as apropriate    Synthia Innocent NP [facility]  Contact [phone] Monday through Friday 8am- 5pm  After hours call [phone]

## 2017-08-19 ENCOUNTER — Ambulatory Visit: Payer: Medicare Other | Admitting: Neurology

## 2017-08-19 DIAGNOSIS — G20 Parkinson's disease: Secondary | ICD-10-CM | POA: Diagnosis not present

## 2017-08-19 DIAGNOSIS — R2689 Other abnormalities of gait and mobility: Secondary | ICD-10-CM | POA: Diagnosis not present

## 2017-08-20 ENCOUNTER — Telehealth: Payer: Self-pay | Admitting: *Deleted

## 2017-08-20 ENCOUNTER — Ambulatory Visit: Payer: Self-pay | Admitting: Neurology

## 2017-08-20 NOTE — Telephone Encounter (Signed)
Pt no-showed new pt appt. 

## 2017-08-31 DIAGNOSIS — M6282 Rhabdomyolysis: Secondary | ICD-10-CM | POA: Diagnosis not present

## 2017-09-01 DIAGNOSIS — M6282 Rhabdomyolysis: Secondary | ICD-10-CM | POA: Diagnosis not present

## 2017-09-02 DIAGNOSIS — M6282 Rhabdomyolysis: Secondary | ICD-10-CM | POA: Diagnosis not present

## 2017-09-03 DIAGNOSIS — M6282 Rhabdomyolysis: Secondary | ICD-10-CM | POA: Diagnosis not present

## 2017-09-04 DIAGNOSIS — M6282 Rhabdomyolysis: Secondary | ICD-10-CM | POA: Diagnosis not present

## 2017-09-08 DIAGNOSIS — M6282 Rhabdomyolysis: Secondary | ICD-10-CM | POA: Diagnosis not present

## 2017-09-08 DIAGNOSIS — F329 Major depressive disorder, single episode, unspecified: Secondary | ICD-10-CM | POA: Diagnosis not present

## 2017-09-08 DIAGNOSIS — F419 Anxiety disorder, unspecified: Secondary | ICD-10-CM | POA: Diagnosis not present

## 2017-09-08 DIAGNOSIS — F0390 Unspecified dementia without behavioral disturbance: Secondary | ICD-10-CM | POA: Diagnosis not present

## 2017-09-08 DIAGNOSIS — G20 Parkinson's disease: Secondary | ICD-10-CM | POA: Diagnosis not present

## 2017-09-09 DIAGNOSIS — G20 Parkinson's disease: Secondary | ICD-10-CM | POA: Diagnosis not present

## 2017-09-09 DIAGNOSIS — R2689 Other abnormalities of gait and mobility: Secondary | ICD-10-CM | POA: Diagnosis not present

## 2017-09-09 DIAGNOSIS — Z23 Encounter for immunization: Secondary | ICD-10-CM | POA: Diagnosis not present

## 2017-09-09 DIAGNOSIS — M6282 Rhabdomyolysis: Secondary | ICD-10-CM | POA: Diagnosis not present

## 2017-09-10 DIAGNOSIS — M6282 Rhabdomyolysis: Secondary | ICD-10-CM | POA: Diagnosis not present

## 2017-09-11 DIAGNOSIS — M6282 Rhabdomyolysis: Secondary | ICD-10-CM | POA: Diagnosis not present

## 2017-09-12 DIAGNOSIS — M6282 Rhabdomyolysis: Secondary | ICD-10-CM | POA: Diagnosis not present

## 2017-09-14 DIAGNOSIS — M6282 Rhabdomyolysis: Secondary | ICD-10-CM | POA: Diagnosis not present

## 2017-09-15 DIAGNOSIS — M6282 Rhabdomyolysis: Secondary | ICD-10-CM | POA: Diagnosis not present

## 2017-09-16 DIAGNOSIS — M6282 Rhabdomyolysis: Secondary | ICD-10-CM | POA: Diagnosis not present

## 2017-09-17 ENCOUNTER — Encounter: Payer: Self-pay | Admitting: Adult Health

## 2017-09-17 ENCOUNTER — Non-Acute Institutional Stay (SKILLED_NURSING_FACILITY): Payer: Medicare Other | Admitting: Adult Health

## 2017-09-17 DIAGNOSIS — E785 Hyperlipidemia, unspecified: Secondary | ICD-10-CM

## 2017-09-17 DIAGNOSIS — I10 Essential (primary) hypertension: Secondary | ICD-10-CM

## 2017-09-17 DIAGNOSIS — R1311 Dysphagia, oral phase: Secondary | ICD-10-CM

## 2017-09-17 DIAGNOSIS — K219 Gastro-esophageal reflux disease without esophagitis: Secondary | ICD-10-CM | POA: Diagnosis not present

## 2017-09-17 DIAGNOSIS — J45909 Unspecified asthma, uncomplicated: Secondary | ICD-10-CM | POA: Diagnosis not present

## 2017-09-17 NOTE — Progress Notes (Signed)
Location:   [facility] Room Number: 111 B Place of Service:  SNF (31)   CODE STATUS: Full code  Allergies  Allergen Reactions  . Hydrocodone Other (See Comments)    Unknown reaction    Chief Complaint  Patient presents with  . Medical Management of Chronic Issues    Hypertension; dyslipidemia; gerd; dysphagia; asthma with allergies     HPI:  She is a 71 year old long term resident of this facility being seen for the management of her chronic illnesses: hypertension; dyslipidemia; asthma and allergies; gerd; dysphagia. She denies any headaches; cough; shortness of breath; or sinus congestion. There are no nursing concerns at this time.    Past Medical History:  Diagnosis Date  . Allergy   . Anxiety   . Arthritis   . Asthma   . Cataract   . Dementia   . Depression   . Difficulty walking   . Dysphagia, oral phase   . Fibromyalgia   . GERD (gastroesophageal reflux disease)   . Hallucinations   . Hyperlipidemia   . Hypertension   . Lower extremity edema   . Parkinson disease (HCC)   . Repeated falls   . Rhabdomyolysis   . Stroke (HCC)   . TIA (transient ischemic attack)   . Urinary incontinence     Past Surgical History:  Procedure Laterality Date  . ABDOMINAL HYSTERECTOMY    . CATARACT EXTRACTION    . CHOLECYSTECTOMY    . TONSILLECTOMY AND ADENOIDECTOMY      Social History   Social History  . Marital status: Divorced    Spouse name: N/A  . Number of children: N/A  . Years of education: N/A   Occupational History  . Not on file.   Social History Main Topics  . Smoking status: Never Smoker  . Smokeless tobacco: Never Used  . Alcohol use No  . Drug use: No  . Sexual activity: No   Other Topics Concern  . Not on file   Social History Narrative  . No narrative on file   Family History  Problem Relation Age of Onset  . Family history unknown: Yes      VITAL SIGNS BP (!) 124/57   Pulse 85   Temp 98 F (36.7 C)   Resp 18    Ht 5\' 5"  (1.651 m)   Wt 166 lb 9.6 oz (75.6 kg)   SpO2 98%   BMI 27.72 kg/m   Patient's Medications  New Prescriptions   No medications on file  Previous Medications   ACETAMINOPHEN (TYLENOL) 325 MG TABLET    Take 325 mg by mouth every 4 (four) hours as needed. X 3 days   ACETAMINOPHEN (TYLENOL) 500 MG TABLET    Take 1,000 mg by mouth 2 (two) times daily.   ALBUTEROL (PROVENTIL HFA;VENTOLIN HFA) 108 (90 BASE) MCG/ACT INHALER    Inhale 2 puffs into the lungs every 6 (six) hours as needed for wheezing or shortness of breath.   ALUMINUM-MAGNESIUM HYDROXIDE-SIMETHICONE (MAALOX) 200-200-20 MG/5ML SUSP    Take 30 mLs by mouth every 6 (six) hours as needed.   AMLODIPINE (NORVASC) 10 MG TABLET    Take 10 mg by mouth daily.   ATORVASTATIN (LIPITOR) 10 MG TABLET    Take 10 mg by mouth daily.   CARBIDOPA-LEVODOPA (SINEMET IR) 25-100 MG TABLET    Take 1 tablet by mouth 4 (four) times daily.    CHOLECALCIFEROL (VITAMIN D) 1000 UNITS TABLET    Take  1,000 Units by mouth daily.   DIPHENHYDRAMINE (BENADRYL) 25 MG TABLET    Take 25 mg by mouth every 8 (eight) hours as needed for itching.   DONEPEZIL (ARICEPT) 10 MG TABLET    Take 10 mg by mouth at bedtime.   DULOXETINE (CYMBALTA) 30 MG CAPSULE    Give 3 capsules by mouth one time a day   FLUTICASONE (FLONASE) 50 MCG/ACT NASAL SPRAY    Place 2 sprays into both nostrils daily.   IBUPROFEN (ADVIL,MOTRIN) 200 MG TABLET    Take 400 mg by mouth every 6 (six) hours as needed.   LORATADINE (CLARITIN) 10 MG TABLET    Take 10 mg by mouth daily.   LORAZEPAM (ATIVAN) 1 MG TABLET    Take 1 tablet (1 mg total) by mouth every 8 (eight) hours.   MELOXICAM (MOBIC) 7.5 MG TABLET    Take 7.5 mg by mouth daily.   MEMANTINE (NAMENDA) 10 MG TABLET    Take 10 mg by mouth 2 (two) times daily.   MENTHOL, TOPICAL ANALGESIC, (BIOFREEZE) 4 % GEL    Apply to posterior neck topically tow times daily for neck pain   NUTRITIONAL SUPPLEMENTS (NUTRITIONAL SUPPLEMENT PO)    House  Supplement with meals.  Large portions at meals.   ONDANSETRON (ZOFRAN) 4 MG TABLET    Take 4 mg by mouth every 6 (six) hours as needed for nausea or vomiting.   PANTOPRAZOLE (PROTONIX) 40 MG TABLET    Take 40 mg by mouth daily.   POLYETHYLENE GLYCOL (MIRALAX / GLYCOLAX) PACKET    Take 17 g by mouth daily.   TRAMADOL (ULTRAM) 50 MG TABLET    Take 25 mg by mouth 3 (three) times daily.   UNABLE TO FIND    HSG Regular Diet - HSG Mech Soft Texture, Regular consistency  Modified Medications   No medications on file  Discontinued Medications   TRAMADOL (ULTRAM) 50 MG TABLET    Take 0.5 tablets (25 mg total) by mouth 2 (two) times daily.     SIGNIFICANT DIAGNOSTIC EXAMS  NO NEW EXAMS  LABS REVIEWED:PREVIOUS   03-19-17: wbc 4.7; hgb 14.5; hct 40.0; mcv 88.8; plt 229; glucose 95; bun 21.6; creat 0.55; k+ 3.0; na++ 144; ca 9.7 liver normal albumin 4.5; tsh 2.69; hgb a1c 4.7; chol 121; ldl 56; trig 134; hdl 39  4-0-987-3-18: Vit D 33.37 06-19-17: glucose 122; bun 17.3; creat 0.71; k+ 4.0; na++ 141; ca 9.4; liver normal 4.8  NO NEW LABS    Review of Systems  Constitutional: Negative for malaise/fatigue.  Respiratory: Negative for cough and shortness of breath.   Cardiovascular: Negative for chest pain, palpitations and leg swelling.  Gastrointestinal: Negative for abdominal pain, constipation and heartburn.  Musculoskeletal: Negative for back pain, joint pain and myalgias.  Skin: Negative.   Neurological: Negative for dizziness.  Psychiatric/Behavioral: The patient is not nervous/anxious.     Physical Exam  Constitutional: She appears well-developed and well-nourished. No distress.  Neck: Neck supple. No thyromegaly present.  Cardiovascular: Normal rate, regular rhythm and intact distal pulses.  Murmur heard. 1/6  Pulmonary/Chest: Effort normal and breath sounds normal. No respiratory distress.  Abdominal: Soft. Bowel sounds are normal. She exhibits no distension. There is no tenderness.    Musculoskeletal: She exhibits no edema.  Able to move all extremities Slight bilateral hand tremor   Lymphadenopathy:    She has no cervical adenopathy.  Skin: Skin is warm and dry. She is not diaphoretic.  Psychiatric: She  has a normal mood and affect.     ASSESSMENT/ PLAN:  TODAY:   1. Hypertension: stable b/p 124/75 will continue norvasc 10 mg daily   2. Dyslipidemia: stable  ldl 56: will continue lipitor 10 mg daily   3.  Asthma with allergic rhinitis:without change  will continue flonase daily; claritin 10 mg daily and albuterol 2 puffs every 6 hours as needed  4. Gerd: stable  will continue protonix 40 mg daily   5. Dysphagia: no signs of aspiration present; will continue current plan of care  PREVIOUS   6. Parkinson's disease: without change will continue sinemet IR 25/100 mg four times daily   7. Vascular dementia: stable her weight is 166 pounds; will continue namenda 10 mg twice daily and aricept 10 mg daily  will continue to monitor her status.   8. Recurrent major depression with anxiety: stable will continue cymbalta 90 mg daily and ativan 1 mg three times daily to help manage her anxiety. She was on lower dose of ativan; but was not effective.    9. Osteoarthritis: stable will continue mobic 7.5 mg daily and has ultram 25 mg twice daily and will continue biofreeze twice daily to posterior neck   10. Vit D deficiency: stable level 33.37  Will continue vit d 1000 units daily   11. Constipation: stable will continue  miralax 17 gm daily      MD is aware of resident's narcotic use and is in agreement with current plan of care. We will attempt to wean resident as apropriate     Synthia Innocent NP [facility]  Contact [phone] Monday through Friday 8am- 5pm  After hours call [phone]

## 2017-09-18 DIAGNOSIS — M6282 Rhabdomyolysis: Secondary | ICD-10-CM | POA: Diagnosis not present

## 2017-09-21 DIAGNOSIS — M6282 Rhabdomyolysis: Secondary | ICD-10-CM | POA: Diagnosis not present

## 2017-09-22 ENCOUNTER — Other Ambulatory Visit: Payer: Self-pay

## 2017-09-22 DIAGNOSIS — M6282 Rhabdomyolysis: Secondary | ICD-10-CM | POA: Diagnosis not present

## 2017-09-22 MED ORDER — TRAMADOL HCL 50 MG PO TABS: 25.0000 mg | ORAL_TABLET | Freq: Three times a day (TID) | ORAL | 0 refills | Status: DC

## 2017-09-22 NOTE — Telephone Encounter (Signed)
RX faxed to [facility] @ [phone], phone number [phone] 

## 2017-09-23 DIAGNOSIS — M6282 Rhabdomyolysis: Secondary | ICD-10-CM | POA: Diagnosis not present

## 2017-09-24 DIAGNOSIS — M6282 Rhabdomyolysis: Secondary | ICD-10-CM | POA: Diagnosis not present

## 2017-09-25 DIAGNOSIS — M6282 Rhabdomyolysis: Secondary | ICD-10-CM | POA: Diagnosis not present

## 2017-09-28 DIAGNOSIS — B351 Tinea unguium: Secondary | ICD-10-CM | POA: Diagnosis not present

## 2017-09-28 DIAGNOSIS — M545 Low back pain: Secondary | ICD-10-CM | POA: Diagnosis not present

## 2017-09-28 DIAGNOSIS — M6282 Rhabdomyolysis: Secondary | ICD-10-CM | POA: Diagnosis not present

## 2017-09-28 DIAGNOSIS — G20 Parkinson's disease: Secondary | ICD-10-CM | POA: Diagnosis not present

## 2017-09-28 DIAGNOSIS — R262 Difficulty in walking, not elsewhere classified: Secondary | ICD-10-CM | POA: Diagnosis not present

## 2017-09-29 DIAGNOSIS — M6282 Rhabdomyolysis: Secondary | ICD-10-CM | POA: Diagnosis not present

## 2017-09-30 DIAGNOSIS — M545 Low back pain: Secondary | ICD-10-CM | POA: Diagnosis not present

## 2017-09-30 DIAGNOSIS — G20 Parkinson's disease: Secondary | ICD-10-CM | POA: Diagnosis not present

## 2017-09-30 DIAGNOSIS — M6282 Rhabdomyolysis: Secondary | ICD-10-CM | POA: Diagnosis not present

## 2017-10-01 DIAGNOSIS — M6282 Rhabdomyolysis: Secondary | ICD-10-CM | POA: Diagnosis not present

## 2017-10-05 DIAGNOSIS — M6282 Rhabdomyolysis: Secondary | ICD-10-CM | POA: Diagnosis not present

## 2017-10-06 ENCOUNTER — Other Ambulatory Visit: Payer: Self-pay

## 2017-10-06 DIAGNOSIS — M6282 Rhabdomyolysis: Secondary | ICD-10-CM | POA: Diagnosis not present

## 2017-10-06 DIAGNOSIS — G20 Parkinson's disease: Secondary | ICD-10-CM | POA: Diagnosis not present

## 2017-10-06 DIAGNOSIS — M545 Low back pain: Secondary | ICD-10-CM | POA: Diagnosis not present

## 2017-10-06 MED ORDER — LORAZEPAM 1 MG PO TABS: 1.0000 mg | ORAL_TABLET | Freq: Three times a day (TID) | ORAL | 0 refills | Status: DC

## 2017-10-06 NOTE — Telephone Encounter (Signed)
RX faxed to [facility] @ [phone], phone number [phone] 

## 2017-10-07 ENCOUNTER — Other Ambulatory Visit: Payer: Self-pay

## 2017-10-07 DIAGNOSIS — M47816 Spondylosis without myelopathy or radiculopathy, lumbar region: Secondary | ICD-10-CM | POA: Diagnosis not present

## 2017-10-07 DIAGNOSIS — M6282 Rhabdomyolysis: Secondary | ICD-10-CM | POA: Diagnosis not present

## 2017-10-07 MED ORDER — TRAMADOL HCL 50 MG PO TABS: 50.0000 mg | ORAL_TABLET | Freq: Three times a day (TID) | ORAL | 0 refills | Status: DC

## 2017-10-07 NOTE — Telephone Encounter (Signed)
RX faxed to [facility] @ [phone], phone number [phone] 

## 2017-10-09 DIAGNOSIS — M6282 Rhabdomyolysis: Secondary | ICD-10-CM | POA: Diagnosis not present

## 2017-10-12 DIAGNOSIS — M6282 Rhabdomyolysis: Secondary | ICD-10-CM | POA: Diagnosis not present

## 2017-10-13 DIAGNOSIS — M6282 Rhabdomyolysis: Secondary | ICD-10-CM | POA: Diagnosis not present

## 2017-10-14 ENCOUNTER — Encounter: Payer: Self-pay | Admitting: Neurology

## 2017-10-14 ENCOUNTER — Ambulatory Visit (INDEPENDENT_AMBULATORY_CARE_PROVIDER_SITE_OTHER): Payer: Medicare Other | Admitting: Neurology

## 2017-10-14 ENCOUNTER — Telehealth: Payer: Self-pay | Admitting: Neurology

## 2017-10-14 VITALS — BP 125/74 | HR 86 | Ht 65.0 in | Wt 167.0 lb

## 2017-10-14 DIAGNOSIS — R269 Unspecified abnormalities of gait and mobility: Secondary | ICD-10-CM | POA: Diagnosis not present

## 2017-10-14 DIAGNOSIS — G8929 Other chronic pain: Secondary | ICD-10-CM | POA: Insufficient documentation

## 2017-10-14 DIAGNOSIS — R2689 Other abnormalities of gait and mobility: Secondary | ICD-10-CM | POA: Diagnosis not present

## 2017-10-14 DIAGNOSIS — M545 Low back pain: Secondary | ICD-10-CM

## 2017-10-14 DIAGNOSIS — G20 Parkinson's disease: Secondary | ICD-10-CM | POA: Diagnosis not present

## 2017-10-14 DIAGNOSIS — M6282 Rhabdomyolysis: Secondary | ICD-10-CM | POA: Diagnosis not present

## 2017-10-14 DIAGNOSIS — R413 Other amnesia: Secondary | ICD-10-CM

## 2017-10-14 MED ORDER — DIAZEPAM 5 MG PO TABS: 5.0000 mg | ORAL_TABLET | Freq: Four times a day (QID) | ORAL | 0 refills | Status: DC | PRN

## 2017-10-14 NOTE — Progress Notes (Signed)
PATIENT: Jennifer Garner DOB: 12/16/1945  Chief Complaint  Patient presents with  . Parkinson's Disease    She is here with her daughter, Jennifer Garner, to establish care for her Parkinson's Disease.  She just relocated from [place] in May 2018.  She resides at [facility].     HISTORICAL  Jennifer CuretSherry Loud is a 71 year old female, seen in refer by primary care nurse practitioner Synthia InnocentGreen, Deborah for evaluation of Parkinson's disease, she is accompanied by her daughter Jennifer Garner, initial evaluation was on October 14, 2017.  I reviewed and summarized the referring note, she has past medical history of anxiety, hypertension, chronic low back pain, depression, fibromyalgia, hyperlipidemia, hypothyroidism,  I was able to review previous neurologist note by previous neurologist [facility] Michaelle [facility]ollan [facility] on December [facility], 2016, change Sinemet 25/103 times a day, continue lorazepam for anxiety, Cymbalta, there was quite a pending the past, Prozac,  [place], [place] moved from [place] to [place] in May 2018, [facility] because of declining functional status, the history is mainly from her daughter.  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, carried a diagnosis of fibromyalgia, she complained of diffuse body achy pain, function at suboptimal level for many years, she barely get out of her house, complains of body achy pain, gait abnormality,  She lived in senior apartment, in 2014, she began to have visual hallucinations, has difficulty managing her medications, she thought her neighbors was talking with her through the wall, through the-ceiling, she called please had multiple occasions,  Over the years, she was treated with different psychotropic medications, with suboptimal control of her mood disorder,   She was was put on more medications with her hallucinations, including Seroquel, Nuplazid and other dopamine  antagonist, with better control of her hallucinations around 2016, she was also noted to have gait abnormality, small shuffling gait, frequent falls, by 2017, she was diagnosed with significant memory loss, parkinsonian features, could not walk for a few months, require extensive rehabilitation, daughter also reported gait is much improved with Sinemet 25/100 mg 3 times a day.  She was treated with ziprasidone20 mg since 2017, and donepezil has really helped her hallucinations, with physical therapy, she is able to walk again, continue complaints of chronic low back pain, diffuse body achy pain,  She denies significant hallucinations now, ziprasidone is no longer on her medication list  Laboratory evaluation seen May 2018, normal TSH, A1c, lipid profile, CBC, BMP  REVIEW OF SYSTEMS: Full 14 system review of systems performed and notable only for memory loss, confusion, headaches, numbness, weakness, tremor, restless leg, depression, anxiety, hallucinations,  ALLERGIES: Allergies  Allergen Reactions  . Hydrocodone Other (See Comments)    GI upset    HOME MEDICATIONS: Current Outpatient Medications  Medication Sig Dispense Refill  . acetaminophen (TYLENOL) 500 MG tablet Take 1,000 mg by mouth 2 (two) times daily.    Marland Kitchen. albuterol (PROVENTIL HFA;VENTOLIN HFA) 108 (90 Base) MCG/ACT inhaler Inhale 2 puffs into the lungs every 6 (six) hours as needed for wheezing or shortness of breath.    Marland Kitchen. aluminum-magnesium hydroxide-simethicone (MAALOX) 200-200-20 MG/5ML SUSP Take 30 mLs by mouth every 6 (six) hours as needed.    Marland Kitchen. amLODipine (NORVASC) 10 MG tablet Take 10 mg by mouth daily.    Marland Kitchen. atorvastatin (LIPITOR) 10 MG tablet Take 10 mg by mouth daily.    . carbidopa-levodopa (SINEMET IR) 25-100 MG tablet Take 1 tablet by mouth 4 (  four) times daily.     . cholecalciferol (VITAMIN D) 1000 units tablet Take 1,000 Units by mouth daily.    . diphenhydrAMINE (BENADRYL) 25 MG tablet Take 25 mg by mouth every 8  (eight) hours as needed for itching.    . donepezil (ARICEPT) 10 MG tablet Take 10 mg by mouth at bedtime.    . DULoxetine (CYMBALTA) 30 MG capsule Give 3 capsules by mouth one time a day    . fluticasone (FLONASE) 50 MCG/ACT nasal spray Place 2 sprays into both nostrils daily.    Marland Kitchen. loratadine (CLARITIN) 10 MG tablet Take 10 mg by mouth daily.    Marland Kitchen. LORazepam (ATIVAN) 1 MG tablet Take 1 tablet (1 mg total) by mouth 3 (three) times daily. 90 tablet 0  . meloxicam (MOBIC) 7.5 MG tablet Take 7.5 mg by mouth daily.    . memantine (NAMENDA) 10 MG tablet Take 10 mg by mouth 2 (two) times daily.    . Menthol, Topical Analgesic, (BIOFREEZE) 4 % GEL Apply to posterior neck topically tow times daily for neck pain    . Nutritional Supplements (NUTRITIONAL SUPPLEMENT PO) House Supplement with meals.  Large portions at meals.    . ondansetron (ZOFRAN) 4 MG tablet Take 4 mg by mouth every 6 (six) hours as needed for nausea or vomiting.    . pantoprazole (PROTONIX) 40 MG tablet Take 40 mg by mouth daily.    . polyethylene glycol (MIRALAX / GLYCOLAX) packet Take 17 g by mouth daily.    . traMADol (ULTRAM) 50 MG tablet Take 1 tablet (50 mg total) by mouth 3 (three) times daily. 90 tablet 0  . UNABLE TO FIND HSG Regular Diet - HSG Mech Soft Texture, Regular consistency     No current facility-administered medications for this visit.     PAST MEDICAL HISTORY: Past Medical History:  Diagnosis Date  . Allergy   . Anxiety   . Arthritis   . Asthma   . Cataract   . Dementia   . Depression   . Difficulty walking   . Dysphagia, oral phase   . Fibromyalgia   . GERD (gastroesophageal reflux disease)   . Hallucinations   . Hyperlipidemia   . Hypertension   . Lower extremity edema   . Parkinson disease (HCC)   . Repeated falls   . Rhabdomyolysis   . Stroke (HCC)   . TIA (transient ischemic attack)   . Urinary incontinence     PAST SURGICAL HISTORY: Past Surgical History:  Procedure Laterality Date    . ABDOMINAL HYSTERECTOMY    . CATARACT EXTRACTION    . CHOLECYSTECTOMY    . TONSILLECTOMY AND ADENOIDECTOMY      FAMILY HISTORY: Family History  Problem Relation Age of Onset  . Non-Hodgkin's lymphoma Mother   . Stroke Mother   . Heart attack Father     SOCIAL HISTORY:  Social History   Socioeconomic History  . Marital status: Divorced    Spouse name: Not on file  . Number of children: 4  . Years of education: 9312  . Highest education level: High school graduate  Social Needs  . Financial resource strain: Not on file  . Food insecurity - worry: Not on file  . Food insecurity - inability: Not on file  . Transportation needs - medical: Not on file  . Transportation needs - non-medical: Not on file  Occupational History  . Not on file  Tobacco Use  . Smoking status: Never Smoker  .  Smokeless tobacco: Never Used  Substance and Sexual Activity  . Alcohol use: No  . Drug use: No  . Sexual activity: No  Other Topics Concern  . Not on file  Social History Narrative   L[facility] at [facility] and [facility].   Right-handed.   2 cups caffeine per day.     PHYSICAL EXAM   Vitals:   10/14/17 1027  BP: 125/74  Pulse: 86  Weight: 167 lb (75.8 kg)  Height: 5\' 5"  (1.651 m)    Not recorded      Body mass index is 27.79 kg/m.  PHYSICAL EXAMNIATION:  Gen: NAD, conversant, well nourised, obese, well groomed                     Cardiovascular: Regular rate rhythm, no peripheral edema, warm, nontender. Eyes: Conjunctivae clear without exudates or hemorrhage Neck: Supple, no carotid bruits. Pulmonary: Clear to auscultation bilaterally   NEUROLOGICAL EXAM: MMSE - Mini Mental State Exam 10/15/2017  Orientation to time 5  Orientation to Place 4  Registration 3  Attention/ Calculation 5  Recall 0  Language- name 2 objects 2  Language- repeat 1  Language- follow 3 step command 3  Language- read & follow direction 1  Write a sentence 1  Copy design 1  Total  score 26    CRANIAL NERVES: CN II: Visual fields are full to confrontation. Fundoscopic exam is normal with sharp discs and no vascular changes. Pupils are round equal and briskly reactive to light. CN III, IV, VI: extraocular movement are normal. No ptosis. CN V: Facial sensation is intact to pinprick in all 3 divisions bilaterally. Corneal responses are intact.  CN VII: Face is symmetric with normal eye closure and smile. CN VIII: Hearing is normal to rubbing fingers CN IX, X: Palate elevates symmetrically. Phonation is normal. CN XI: Head turning and shoulder shrug are intact CN XII: Tongue is midline with normal movements and no atrophy.  MOTOR: Mildly symmetric bilateral upper extremity rigidity, there is no significant weakness, mild symmetric bradykinesia  REFLEXES: Reflexes are 2+ and symmetric at the biceps, triceps, knees, and ankles. Plantar responses are flexor.  SENSORY: Intact to light touch, pinprick, positional sensation and vibratory sensation are intact in fingers and toes.  COORDINATION: Rapid alternating movements and fine finger movements are intact. There is no dysmetria on finger-to-nose and heel-knee-shin.    GAIT/STANCE: She needs pushed up to get up from seated position, leaning forward, cautious, antalgic   DIAGNOSTIC DATA (LABS, IMAGING, TESTING) - I reviewed patient records, labs, notes, testing and imaging myself where available.   ASSESSMENT AND PLAN  Carleigh Buccieri is a 71 y.o. female   Dementia with hallucinations  Overall has improved with Aricept treatment, dopamine antagonist ziprasidone 20 mg was stopped  MRI of brain  Gait abnormality with mild parkinsonian features,  This could be due to long-term psychotropic medication treatment, versus central nervous system degenerative disorder versus pain  Improved with Sinemet 25/100 mg 3 times a day,  Continue physical therapy Low back pain  MRI of lumbar  Nerve conduction EMG for  complaints of bilateral lower extremity paresthesia Levert Feinstein, M.D. Ph.D.  [facility] [address], [address] [place], [place] [address] Ph: [phone] Fax: [phone]  CC: Sharee Holster, NP

## 2017-10-14 NOTE — Telephone Encounter (Signed)
She needs Open MRI, valium as needed

## 2017-10-15 DIAGNOSIS — M6282 Rhabdomyolysis: Secondary | ICD-10-CM | POA: Diagnosis not present

## 2017-10-16 DIAGNOSIS — M545 Low back pain: Secondary | ICD-10-CM | POA: Diagnosis not present

## 2017-10-16 DIAGNOSIS — G20 Parkinson's disease: Secondary | ICD-10-CM | POA: Diagnosis not present

## 2017-10-16 DIAGNOSIS — M6282 Rhabdomyolysis: Secondary | ICD-10-CM | POA: Diagnosis not present

## 2017-10-16 DIAGNOSIS — R2689 Other abnormalities of gait and mobility: Secondary | ICD-10-CM | POA: Diagnosis not present

## 2017-10-19 ENCOUNTER — Non-Acute Institutional Stay (SKILLED_NURSING_FACILITY): Payer: Medicare Other | Admitting: Adult Health

## 2017-10-19 ENCOUNTER — Encounter: Payer: Self-pay | Admitting: Adult Health

## 2017-10-19 DIAGNOSIS — F330 Major depressive disorder, recurrent, mild: Secondary | ICD-10-CM | POA: Diagnosis not present

## 2017-10-19 DIAGNOSIS — G20 Parkinson's disease: Secondary | ICD-10-CM

## 2017-10-19 DIAGNOSIS — M150 Primary generalized (osteo)arthritis: Secondary | ICD-10-CM

## 2017-10-19 DIAGNOSIS — M6282 Rhabdomyolysis: Secondary | ICD-10-CM | POA: Diagnosis not present

## 2017-10-19 DIAGNOSIS — F0150 Vascular dementia without behavioral disturbance: Secondary | ICD-10-CM | POA: Diagnosis not present

## 2017-10-19 DIAGNOSIS — M159 Polyosteoarthritis, unspecified: Secondary | ICD-10-CM

## 2017-10-19 NOTE — Progress Notes (Signed)
Location:   [facility] Room Number: 111 B Place of Service:  SNF (31)   CODE STATUS: Full Code  Allergies  Allergen Reactions  . Hydrocodone Other (See Comments)    GI upset    Chief Complaint  Patient presents with  . Medical Management of Chronic Issues    Parkinson; dementia; major depression; osteoarthritis: GDR     HPI:  She is a 71 year old long term resident of this facility being for the management of her chronic illnesses: parkinson disease; vascular dementia without behavioral disturbance;  Primary osteoarthritis involving multiple joints; recurrent major depression. She does have chronic back pain; is awaiting her back support; she has been seen by neurology. She denies any changes in appetite; no reports of behavioral issues. There are no nursing concerns at this time.   Past Medical History:  Diagnosis Date  . Allergy   . Anxiety   . Arthritis   . Asthma   . Cataract   . Dementia   . Depression   . Difficulty walking   . Dysphagia, oral phase   . Fibromyalgia   . GERD (gastroesophageal reflux disease)   . Hallucinations   . Hyperlipidemia   . Hypertension   . Lower extremity edema   . Parkinson disease (HCC)   . Repeated falls   . Rhabdomyolysis   . Stroke (HCC)   . TIA (transient ischemic attack)   . Urinary incontinence     Past Surgical History:  Procedure Laterality Date  . ABDOMINAL HYSTERECTOMY    . CATARACT EXTRACTION    . CHOLECYSTECTOMY    . TONSILLECTOMY AND ADENOIDECTOMY      Social History   Socioeconomic History  . Marital status: Divorced    Spouse name: Not on file  . Number of children: 4  . Years of education: 3712  . Highest education level: High school graduate  Social Needs  . Financial resource strain: Not on file  . Food insecurity - worry: Not on file  . Food insecurity - inability: Not on file  . Transportation needs - medical: Not on file  . Transportation needs - non-medical: Not on file    Occupational History  . Not on file  Tobacco Use  . Smoking status: Never Smoker  . Smokeless tobacco: Never Used  Substance and Sexual Activity  . Alcohol use: No  . Drug use: No  . Sexual activity: No  Other Topics Concern  . Not on file  Social History Narrative   [facility].   Right-handed.   2 cups caffeine per day.   Family History  Problem Relation Age of Onset  . Non-Hodgkin's lymphoma Mother   . Stroke Mother   . Heart attack Father       VITAL SIGNS BP 120/70   Ht 5\' 5"  (1.651 m)   Wt 168 lb 3.2 oz (76.3 kg)   BMI 27.99 kg/m   Outpatient Encounter Medications as of 10/19/2017  Medication Sig  . acetaminophen (TYLENOL) 500 MG tablet Take 1,000 mg by mouth 3 (three) times daily.   Marland Kitchen. albuterol (PROVENTIL HFA;VENTOLIN HFA) 108 (90 Base) MCG/ACT inhaler Inhale 2 puffs into the lungs every 6 (six) hours as needed for wheezing or shortness of breath.  Marland Kitchen. aluminum-magnesium hydroxide-simethicone (MAALOX) 200-200-20 MG/5ML SUSP Take 30 mLs by mouth every 6 (six) hours as needed.  Marland Kitchen. amLODipine (NORVASC) 10 MG tablet Take 10 mg by mouth daily.  Marland Kitchen. atorvastatin (LIPITOR) 10  MG tablet Take 10 mg by mouth daily.  . carbidopa-levodopa (SINEMET IR) 25-100 MG tablet Take 1 tablet by mouth 4 (four) times daily.   . cholecalciferol (VITAMIN D) 1000 units tablet Take 1,000 Units by mouth daily.  . diphenhydrAMINE (BENADRYL) 25 MG tablet Take 25 mg by mouth every 8 (eight) hours as needed for itching.  . donepezil (ARICEPT) 10 MG tablet Take 10 mg by mouth at bedtime.  . DULoxetine (CYMBALTA) 30 MG capsule Give 3 capsules by mouth one time a day  . fluticasone (FLONASE) 50 MCG/ACT nasal spray Place 2 sprays into both nostrils daily.  Marland Kitchen loratadine (CLARITIN) 10 MG tablet Take 10 mg by mouth daily.  Marland Kitchen LORazepam (ATIVAN) 1 MG tablet Take 1 tablet (1 mg total) by mouth 3 (three) times daily.  . meloxicam (MOBIC) 7.5 MG tablet Take 7.5 mg by mouth daily.   . memantine (NAMENDA) 10 MG tablet Take 10 mg by mouth 2 (two) times daily.  . Menthol, Topical Analgesic, (BIOFREEZE) 4 % GEL Apply to posterior neck topically tow times daily for neck pain  . Nutritional Supplements (NUTRITIONAL SUPPLEMENT PO) House Supplement with meals.  Large portions at meals.  . ondansetron (ZOFRAN) 4 MG tablet Take 4 mg by mouth every 6 (six) hours as needed for nausea or vomiting.  . pantoprazole (PROTONIX) 40 MG tablet Take 40 mg by mouth daily.  . polyethylene glycol (MIRALAX / GLYCOLAX) packet Take 17 g by mouth daily.  . traMADol (ULTRAM) 50 MG tablet Take 1 tablet (50 mg total) by mouth 3 (three) times daily.  Marland Kitchen UNABLE TO FIND HSG Regular Diet - HSG Mech Soft Texture, Regular consistency  . [DISCONTINUED] diazepam (VALIUM) 5 MG tablet Take 1 tablet (5 mg total) by mouth every 6 (six) hours as needed for anxiety. (Patient not taking: Reported on 10/19/2017)   No facility-administered encounter medications on file as of 10/19/2017.      SIGNIFICANT DIAGNOSTIC EXAMS  NO NEW EXAMS  LABS REVIEWED:PREVIOUS   03-19-17: wbc 4.7; hgb 14.5; hct 40.0; mcv 88.8; plt 229; glucose 95; bun 21.6; creat 0.55; k+ 3.0; na++ 144; ca 9.7 liver normal albumin 4.5; tsh 2.69; hgb a1c 4.7; chol 121; ldl 56; trig 134; hdl 39  11-25-12: Vit D 33.37 06-19-17: glucose 122; bun 17.3; creat 0.71; k+ 4.0; na++ 141; ca 9.4; liver normal 4.8  NO NEW LABS     Review of Systems  Constitutional: Negative for malaise/fatigue.  Respiratory: Negative for cough and shortness of breath.   Cardiovascular: Negative for chest pain, palpitations and leg swelling.  Gastrointestinal: Negative for abdominal pain, constipation and heartburn.  Genitourinary: Negative for dysuria.  Musculoskeletal: Positive for back pain. Negative for joint pain and myalgias.       Is being managed   Skin: Negative.   Neurological: Negative for dizziness.  Psychiatric/Behavioral: The patient is not nervous/anxious.      Physical Exam  Constitutional: She is oriented to person, place, and time. She appears well-developed and well-nourished. No distress.  Neck: Neck supple. No thyromegaly present.  Cardiovascular: Normal rate, regular rhythm and intact distal pulses.  Murmur heard. 1/6  Pulmonary/Chest: Effort normal and breath sounds normal. No respiratory distress.  Abdominal: Soft. Bowel sounds are normal. She exhibits no distension. There is no tenderness.  Musculoskeletal: Normal range of motion. She exhibits no edema.  Has numbness in bilateral feet  Has slight bilateral hand tremor   Lymphadenopathy:    She has no cervical adenopathy.  Neurological: She  is alert and oriented to person, place, and time.  Skin: Skin is warm and dry. She is not diaphoretic.  Psychiatric: She has a normal mood and affect.    ASSESSMENT/ PLAN:  TODAY:   1. Parkinson's disease: without change will continue sinemet IR 25/100 mg four times daily   2. Vascular dementia: stable her weight is 168 pounds; will continue namenda 10 mg twice daily and aricept 10 mg daily  will continue to monitor her status.   3. Recurrent major depression with anxiety: stable will continue cymbalta 90 mg daily and ativan 1 mg three times daily to help manage her anxiety. She was on lower dose of ativan; but was not effective.   She is due for GRD: at this time lowering her medications is not indicated; lowering her medications could cause her significant emotional harm.   4. Osteoarthritis: stable will continue mobic 7.5 mg daily biofreeze twice daily to posterior neck  Tylenol 1 gm three times daily and ultram 50 mg three times daily   PREVIOUS   5. Vit D deficiency: stable level 33.37  Will continue vit d 1000 units daily   6. Constipation: stable will continue  miralax 17 gm daily   7. Hypertension: stable b/p 120/70 will continue norvasc 10 mg daily   8. Dyslipidemia: stable  ldl 56: will continue lipitor 10 mg daily   9.   Asthma with allergic rhinitis:without change  will continue flonase daily; claritin 10 mg daily and albuterol 2 puffs every 6 hours as needed  10. Gerd: stable  will continue protonix 40 mg daily   11. Dysphagia: no signs of aspiration present is on thin liquids ; will continue current plan of care   Will check cbc cmp ; lipids    MD is aware of resident's narcotic use and is in agreement with current plan of care. We will attempt to wean resident as apropriate     Synthia Innocenteborah Green NP [facility]  Contact [phone] Monday through Friday 8am- 5pm  After hours call [phone]

## 2017-10-20 DIAGNOSIS — M6282 Rhabdomyolysis: Secondary | ICD-10-CM | POA: Diagnosis not present

## 2017-10-20 DIAGNOSIS — R2689 Other abnormalities of gait and mobility: Secondary | ICD-10-CM | POA: Diagnosis not present

## 2017-10-20 DIAGNOSIS — G20 Parkinson's disease: Secondary | ICD-10-CM | POA: Diagnosis not present

## 2017-10-20 DIAGNOSIS — M545 Low back pain: Secondary | ICD-10-CM | POA: Diagnosis not present

## 2017-10-21 ENCOUNTER — Other Ambulatory Visit: Payer: Self-pay

## 2017-10-21 DIAGNOSIS — M6282 Rhabdomyolysis: Secondary | ICD-10-CM | POA: Diagnosis not present

## 2017-10-21 DIAGNOSIS — G20 Parkinson's disease: Secondary | ICD-10-CM | POA: Diagnosis not present

## 2017-10-21 DIAGNOSIS — F329 Major depressive disorder, single episode, unspecified: Secondary | ICD-10-CM | POA: Diagnosis not present

## 2017-10-21 DIAGNOSIS — F0390 Unspecified dementia without behavioral disturbance: Secondary | ICD-10-CM | POA: Diagnosis not present

## 2017-10-21 DIAGNOSIS — F419 Anxiety disorder, unspecified: Secondary | ICD-10-CM | POA: Diagnosis not present

## 2017-10-21 MED ORDER — TRAMADOL HCL 50 MG PO TABS: 50.0000 mg | ORAL_TABLET | Freq: Four times a day (QID) | ORAL | 0 refills | Status: DC

## 2017-10-21 NOTE — Telephone Encounter (Signed)
RX faxed to [facility] @ [phone], phone number [phone] 

## 2017-10-22 DIAGNOSIS — Z961 Presence of intraocular lens: Secondary | ICD-10-CM | POA: Diagnosis not present

## 2017-10-22 DIAGNOSIS — G20 Parkinson's disease: Secondary | ICD-10-CM | POA: Diagnosis not present

## 2017-10-22 DIAGNOSIS — M545 Low back pain: Secondary | ICD-10-CM | POA: Diagnosis not present

## 2017-10-22 DIAGNOSIS — H04123 Dry eye syndrome of bilateral lacrimal glands: Secondary | ICD-10-CM | POA: Diagnosis not present

## 2017-10-22 DIAGNOSIS — H524 Presbyopia: Secondary | ICD-10-CM | POA: Diagnosis not present

## 2017-10-22 DIAGNOSIS — M6282 Rhabdomyolysis: Secondary | ICD-10-CM | POA: Diagnosis not present

## 2017-10-22 DIAGNOSIS — R2689 Other abnormalities of gait and mobility: Secondary | ICD-10-CM | POA: Diagnosis not present

## 2017-10-23 DIAGNOSIS — M6282 Rhabdomyolysis: Secondary | ICD-10-CM | POA: Diagnosis not present

## 2017-10-26 DIAGNOSIS — M6282 Rhabdomyolysis: Secondary | ICD-10-CM | POA: Diagnosis not present

## 2017-10-27 DIAGNOSIS — M6282 Rhabdomyolysis: Secondary | ICD-10-CM | POA: Diagnosis not present

## 2017-10-28 ENCOUNTER — Encounter: Payer: Self-pay | Admitting: Adult Health

## 2017-10-28 DIAGNOSIS — M6282 Rhabdomyolysis: Secondary | ICD-10-CM | POA: Diagnosis not present

## 2017-10-28 NOTE — Progress Notes (Signed)
Entered in error -  Meeting cancelled

## 2017-10-29 DIAGNOSIS — M6282 Rhabdomyolysis: Secondary | ICD-10-CM | POA: Diagnosis not present

## 2017-10-30 DIAGNOSIS — M6282 Rhabdomyolysis: Secondary | ICD-10-CM | POA: Diagnosis not present

## 2017-11-02 DIAGNOSIS — M6282 Rhabdomyolysis: Secondary | ICD-10-CM | POA: Diagnosis not present

## 2017-11-02 DIAGNOSIS — G20 Parkinson's disease: Secondary | ICD-10-CM | POA: Diagnosis not present

## 2017-11-02 DIAGNOSIS — M545 Low back pain: Secondary | ICD-10-CM | POA: Diagnosis not present

## 2017-11-02 DIAGNOSIS — R2689 Other abnormalities of gait and mobility: Secondary | ICD-10-CM | POA: Diagnosis not present

## 2017-11-03 ENCOUNTER — Encounter: Payer: Self-pay | Admitting: Adult Health

## 2017-11-03 ENCOUNTER — Non-Acute Institutional Stay (SKILLED_NURSING_FACILITY): Payer: Medicare Other | Admitting: Adult Health

## 2017-11-03 DIAGNOSIS — D649 Anemia, unspecified: Secondary | ICD-10-CM | POA: Diagnosis not present

## 2017-11-03 DIAGNOSIS — R0902 Hypoxemia: Secondary | ICD-10-CM | POA: Diagnosis not present

## 2017-11-03 DIAGNOSIS — J111 Influenza due to unidentified influenza virus with other respiratory manifestations: Secondary | ICD-10-CM | POA: Diagnosis not present

## 2017-11-03 DIAGNOSIS — J209 Acute bronchitis, unspecified: Secondary | ICD-10-CM | POA: Diagnosis not present

## 2017-11-03 DIAGNOSIS — E119 Type 2 diabetes mellitus without complications: Secondary | ICD-10-CM | POA: Diagnosis not present

## 2017-11-03 DIAGNOSIS — M6282 Rhabdomyolysis: Secondary | ICD-10-CM | POA: Diagnosis not present

## 2017-11-03 DIAGNOSIS — Z5181 Encounter for therapeutic drug level monitoring: Secondary | ICD-10-CM | POA: Diagnosis not present

## 2017-11-03 LAB — HEPATIC FUNCTION PANEL
ALK PHOS: 120 (ref 25–125)
ALT: 5 — AB (ref 7–35)
AST: 12 — AB (ref 13–35)
BILIRUBIN, TOTAL: 0.6

## 2017-11-03 LAB — CBC AND DIFFERENTIAL
HCT: 35 — AB (ref 36–46)
Hemoglobin: 12 (ref 12.0–16.0)
NEUTROS ABS: 8
Platelets: 214 (ref 150–399)
WBC: 9.5

## 2017-11-03 LAB — BASIC METABOLIC PANEL
BUN: 16 (ref 4–21)
Creatinine: 0.6 (ref 0.5–1.1)
Glucose: 105
Potassium: 3.6 (ref 3.4–5.3)
SODIUM: 142 (ref 137–147)

## 2017-11-03 NOTE — Telephone Encounter (Signed)
Patient is schedule to have her MRI's done at [facility] on 11/06/17.

## 2017-11-03 NOTE — Progress Notes (Signed)
Location:   [facility] Room Number: 111 B Place of Service:  SNF (31)   CODE STATUS: Full Code  Allergies  Allergen Reactions  . Hydrocodone Other (See Comments)    GI upset    Chief Complaint  Patient presents with  . Acute Visit    Hypoxia    HPI:  Last night she was found to be hypoxic at 88% she was placed on 02 at 2L with improvement of her sats. She is complaining of body aches and pain; cough; sore throat and fevers. There are no reports of nausea or vomiting.   Past Medical History:  Diagnosis Date  . Allergy   . Anxiety   . Arthritis   . Asthma   . Cataract   . Dementia   . Depression   . Difficulty walking   . Dysphagia, oral phase   . Fibromyalgia   . GERD (gastroesophageal reflux disease)   . Hallucinations   . Hyperlipidemia   . Hypertension   . Lower extremity edema   . Parkinson disease (HCC)   . Repeated falls   . Rhabdomyolysis   . Stroke (HCC)   . TIA (transient ischemic attack)   . Urinary incontinence     Past Surgical History:  Procedure Laterality Date  . ABDOMINAL HYSTERECTOMY    . CATARACT EXTRACTION    . CHOLECYSTECTOMY    . TONSILLECTOMY AND ADENOIDECTOMY      Social History   Socioeconomic History  . Marital status: Divorced    Spouse name: Not on file  . Number of children: 4  . Years of education: 105  . Highest education level: High school graduate  Social Needs  . Financial resource strain: Not on file  . Food insecurity - worry: Not on file  . Food insecurity - inability: Not on file  . Transportation needs - medical: Not on file  . Transportation needs - non-medical: Not on file  Occupational History  . Not on file  Tobacco Use  . Smoking status: Never Smoker  . Smokeless tobacco: Never Used  Substance and Sexual Activity  . Alcohol use: No  . Drug use: No  . Sexual activity: No  Other Topics Concern  . Not on file  Social History Narrative   [facility] and [facility].   Right-handed.   2 cups caffeine per day.   Family History  Problem Relation Age of Onset  . Non-Hodgkin's lymphoma Mother   . Stroke Mother   . Heart attack Father       VITAL SIGNS BP (!) 174/88   Pulse 78   Temp 97.9 F (36.6 C)   Resp 16   Ht 5\' 5"  (1.651 m)   Wt 170 lb 3.2 oz (77.2 kg)   SpO2 (!) 86%   BMI 28.32 kg/m   Outpatient Encounter Medications as of 11/03/2017  Medication Sig  . acetaminophen (TYLENOL) 500 MG tablet Take 1,000 mg by mouth 3 (three) times daily.   Marland Kitchen albuterol (PROVENTIL HFA;VENTOLIN HFA) 108 (90 Base) MCG/ACT inhaler Inhale 2 puffs into the lungs every 6 (six) hours as needed for wheezing or shortness of breath.  Marland Kitchen aluminum-magnesium hydroxide-simethicone (MAALOX) 200-200-20 MG/5ML SUSP Take 30 mLs by mouth every 6 (six) hours as needed.  Marland Kitchen amLODipine (NORVASC) 10 MG tablet Take 10 mg by mouth daily.  Marland Kitchen atorvastatin (LIPITOR) 10 MG tablet Take 10 mg by mouth daily.  . carbidopa-levodopa (SINEMET IR) 25-100 MG tablet Take 1 tablet  by mouth 4 (four) times daily.   . cholecalciferol (VITAMIN D) 1000 units tablet Take 1,000 Units by mouth daily.  . diazepam (VALIUM) 5 MG tablet Take 5 mg by mouth every 6 (six) hours as needed for anxiety. Also give PRN dose of 5 mg before MRI on 11-20-17  . diphenhydrAMINE (BENADRYL) 25 MG tablet Take 25 mg by mouth every 8 (eight) hours as needed for itching.  . donepezil (ARICEPT) 10 MG tablet Take 10 mg by mouth at bedtime.  . DULoxetine (CYMBALTA) 30 MG capsule Give 3 capsules by mouth one time a day  . fluticasone (FLONASE) 50 MCG/ACT nasal spray Place 2 sprays into both nostrils daily.  Marland Kitchen. loratadine (CLARITIN) 10 MG tablet Take 10 mg by mouth daily.  Marland Kitchen. LORazepam (ATIVAN) 1 MG tablet Take 1 tablet (1 mg total) by mouth 3 (three) times daily.  . meloxicam (MOBIC) 7.5 MG tablet Take 7.5 mg by mouth daily.  . memantine (NAMENDA) 10 MG tablet Take 10 mg by mouth 2 (two) times daily.  . Menthol, Topical Analgesic,  (BIOFREEZE) 4 % GEL Apply to posterior neck topically tow times daily for neck pain  . Nutritional Supplements (NUTRITIONAL SUPPLEMENT PO) House Supplement with meals.  Large portions at meals.  . ondansetron (ZOFRAN) 4 MG tablet Take 4 mg by mouth every 6 (six) hours as needed for nausea or vomiting.  . pantoprazole (PROTONIX) 40 MG tablet Take 40 mg by mouth daily.  . polyethylene glycol (MIRALAX / GLYCOLAX) packet Take 17 g by mouth daily.  . traMADol (ULTRAM) 50 MG tablet Take 1 tablet (50 mg total) by mouth 4 (four) times daily.  . [DISCONTINUED] UNABLE TO FIND HSG Regular Diet - HSG Mech Soft Texture, Regular consistency   No facility-administered encounter medications on file as of 11/03/2017.      SIGNIFICANT DIAGNOSTIC EXAMS  NO NEW EXAMS  LABS REVIEWED:PREVIOUS   03-19-17: wbc 4.7; hgb 14.5; hct 40.0; mcv 88.8; plt 229; glucose 95; bun 21.6; creat 0.55; k+ 3.0; na++ 144; ca 9.7 liver normal albumin 4.5; tsh 2.69; hgb a1c 4.7; chol 121; ldl 56; trig 134; hdl 39  1-6-107-3-18: Vit D 33.37 06-19-17: glucose 122; bun 17.3; creat 0.71; k+ 4.0; na++ 141; ca 9.4; liver normal 4.8  TODAY:  11-03-17: wbc 9.5; hgb 12.0; hct 34.6; mcv 90.7; plt 214; glucose 105; bun 16.1; creat 0.59 ;k+ 3.6; na++ 142; ca 8.9; liver normal albumin 4.2     Review of Systems  Constitutional: Positive for fever and malaise/fatigue.  HENT: Positive for sore throat.   Respiratory: Positive for cough. Negative for sputum production.   Cardiovascular: Negative for chest pain and palpitations.  Gastrointestinal: Positive for abdominal pain and diarrhea.  Genitourinary: Negative for dysuria.  Musculoskeletal:       Has generalized body aches   Neurological: Positive for weakness. Negative for headaches.  Psychiatric/Behavioral: The patient is not nervous/anxious.     Physical Exam  Constitutional: She is oriented to person, place, and time. She appears well-developed and well-nourished. No distress.  HENT:    Oropharynx red without white patches present   Neck: No thyromegaly present.  Cardiovascular: Normal rate, regular rhythm and intact distal pulses.  Murmur heard. 1/6  Pulmonary/Chest: No respiratory distress.  Has increased effort; on 02; breath sounds are coarse  Abdominal: Soft. Bowel sounds are normal. She exhibits no distension. There is no tenderness.  Musculoskeletal: Normal range of motion. She exhibits no edema.  Lymphadenopathy:    She has no  cervical adenopathy.  Neurological: She is alert and oriented to person, place, and time.  Skin: Skin is warm and dry. She is not diaphoretic.   ASSESSMENT/ PLAN:  TODAY:   1. Acute bronchitis: will begin zithromanx 500 mg daily for 7 days will being robitussin DM 20 cc every 6 hours as needed for cough for 2 weeks.   2. Influenza: will begin tamiflu 75 mg twice daily for 14 days and will obtain swab.    MD is aware of resident's narcotic use and is in agreement with current plan of care. We will attempt to wean resident as apropriate     Synthia Innocenteborah Green NP [facility]  Contact [phone] Monday through Friday 8am- 5pm  After hours call [phone]

## 2017-11-04 DIAGNOSIS — M6282 Rhabdomyolysis: Secondary | ICD-10-CM | POA: Diagnosis not present

## 2017-11-05 ENCOUNTER — Other Ambulatory Visit: Payer: Self-pay

## 2017-11-05 DIAGNOSIS — M6282 Rhabdomyolysis: Secondary | ICD-10-CM | POA: Diagnosis not present

## 2017-11-05 MED ORDER — LORAZEPAM 1 MG PO TABS: 1.0000 mg | ORAL_TABLET | Freq: Three times a day (TID) | ORAL | 0 refills | Status: DC

## 2017-11-05 NOTE — Telephone Encounter (Signed)
RX faxed to [facility] fax # [phone] Phone #[phone] 

## 2017-11-06 DIAGNOSIS — M6282 Rhabdomyolysis: Secondary | ICD-10-CM | POA: Diagnosis not present

## 2017-11-09 DIAGNOSIS — M6282 Rhabdomyolysis: Secondary | ICD-10-CM | POA: Diagnosis not present

## 2017-11-10 DIAGNOSIS — M6282 Rhabdomyolysis: Secondary | ICD-10-CM | POA: Diagnosis not present

## 2017-11-11 DIAGNOSIS — M6282 Rhabdomyolysis: Secondary | ICD-10-CM | POA: Diagnosis not present

## 2017-11-12 DIAGNOSIS — M6282 Rhabdomyolysis: Secondary | ICD-10-CM | POA: Diagnosis not present

## 2017-11-13 DIAGNOSIS — M6282 Rhabdomyolysis: Secondary | ICD-10-CM | POA: Diagnosis not present

## 2017-11-15 DIAGNOSIS — J209 Acute bronchitis, unspecified: Secondary | ICD-10-CM | POA: Insufficient documentation

## 2017-11-15 DIAGNOSIS — J111 Influenza due to unidentified influenza virus with other respiratory manifestations: Secondary | ICD-10-CM | POA: Insufficient documentation

## 2017-11-16 DIAGNOSIS — M6282 Rhabdomyolysis: Secondary | ICD-10-CM | POA: Diagnosis not present

## 2017-11-19 ENCOUNTER — Non-Acute Institutional Stay (SKILLED_NURSING_FACILITY): Payer: Medicare Other | Admitting: Internal Medicine

## 2017-11-19 ENCOUNTER — Encounter: Payer: Self-pay | Admitting: Internal Medicine

## 2017-11-19 DIAGNOSIS — Z79899 Other long term (current) drug therapy: Secondary | ICD-10-CM

## 2017-11-19 DIAGNOSIS — F339 Major depressive disorder, recurrent, unspecified: Secondary | ICD-10-CM | POA: Diagnosis not present

## 2017-11-19 DIAGNOSIS — F0150 Vascular dementia without behavioral disturbance: Secondary | ICD-10-CM

## 2017-11-19 DIAGNOSIS — G20 Parkinson's disease: Secondary | ICD-10-CM

## 2017-11-19 DIAGNOSIS — Z8673 Personal history of transient ischemic attack (TIA), and cerebral infarction without residual deficits: Secondary | ICD-10-CM | POA: Diagnosis not present

## 2017-11-19 DIAGNOSIS — I10 Essential (primary) hypertension: Secondary | ICD-10-CM

## 2017-11-19 NOTE — Progress Notes (Signed)
Patient ID: Jennifer Garner, female   DOB: 03/23/1946, 72 y.o.   MRN: [id number]   Location:  [facility] Room Number: 111 B Place of Service:  SNF (31) Provider:  DR Mehreen Azizi Ivan Croft, DO  Patient Care Team: Kirt Boys, DO as PCP - General (Internal Medicine) Center, [facility]  Extended Emergency Contact Information Primary Emergency Contact: [name] Home Phone: [phone] Relation: Daughter Secondary Emergency Contact: [name],[name]  [place] Home Phone: [phone] Relation: Daughter  Code Status:  DNR Goals of care: Advanced Directive information Advanced Directives 11/19/2017  Does Patient Have a Medical Advance Directive? Yes  Type of Advance Directive Out of facility DNR (pink MOST or yellow form)  Does patient want to make changes to medical advance directive? No - Patient declined  Would patient like information on creating a medical advance directive? -  Pre-existing out of facility DNR order (yellow form or pink MOST form) Pink MOST form placed in chart (order not valid for inpatient use)     Chief Complaint  Patient presents with  . Medical Management of Chronic Issues    Routine Visit-[facility]    HPI:  Pt is a 72 y.o. female seen today for medical management of chronic diseases.  She was tx for acute bronchitis last month. CBC, CMP and lipid panel drawn. Today she reports cough resolved and no CP or SOB. No wheezing. She occasionally has chills. She is a poor historian due to dementia. Hx obtained from chart  Parkinson's disease - stable on sinemet IR 25/100 mg four times daily   Vascular dementia: stable on namenda 10 mg twice daily and aricept 10 mg daily; weight is stable  Recurrent major depression with anxiety - mood stable on cymbalta 90 mg daily; ativan 1 mg three times daily (she has taken a lower dose of ativan but it was not  effective.);  GDR at this time is not indicated as lowering her medications could cause her significant emotional harm.   Osteoarthritis - stable on mobic 7.5 mg daily; biofreeze twice daily to posterior neck; Tylenol 1 gm three times daily; ultram 50 mg three times daily   Vitamin  D deficiency - stable on Vit D3 1000 units daily; Vitamin D 25OH level 33.37   Constipation - stable on miralax 17 gm daily   Hypertension - stable on norvasc 10 mg daily   Dyslipidemia - stable on lipitor 10 mg daily; LDL 56  Asthma with allergic rhinitis - stable on flonase daily; claritin 10 mg daily; albuterol 2 puffs every 6 hours as needed  GERD - stable on protonix 40 mg daily   Hx Dysphagia - stable with no signs of aspiration at this time. She gets thin liquids   Past Medical History:  Diagnosis Date  . Allergy   . Anxiety   . Arthritis   . Asthma   . Cataract   . Dementia   . Depression   . Difficulty walking   . Dysphagia, oral phase   . Fibromyalgia   . GERD (gastroesophageal reflux disease)   . Hallucinations   . Hyperlipidemia   . Hypertension   . Lower extremity edema   . Parkinson disease (HCC)   . Repeated falls   . Rhabdomyolysis   . Stroke (HCC)   . TIA (transient ischemic attack)   . Urinary incontinence    Past Surgical History:  Procedure Laterality Date  .  ABDOMINAL HYSTERECTOMY    . CATARACT EXTRACTION    . CHOLECYSTECTOMY    . TONSILLECTOMY AND ADENOIDECTOMY      Allergies  Allergen Reactions  . Hydrocodone Other (See Comments)    GI upset    Outpatient Encounter Medications as of 11/19/2017  Medication Sig  . acetaminophen (TYLENOL) 500 MG tablet Take 1,000 mg by mouth 3 (three) times daily.   Marland Kitchen albuterol (PROVENTIL HFA;VENTOLIN HFA) 108 (90 Base) MCG/ACT inhaler Inhale 2 puffs into the lungs every 6 (six) hours as needed for wheezing or shortness of breath.  Marland Kitchen aluminum-magnesium hydroxide-simethicone (MAALOX) 200-200-20 MG/5ML SUSP Take 30 mLs by mouth  every 6 (six) hours as needed.  Marland Kitchen amLODipine (NORVASC) 10 MG tablet Take 10 mg by mouth daily.  Marland Kitchen atorvastatin (LIPITOR) 10 MG tablet Take 10 mg by mouth daily.  . carbidopa-levodopa (SINEMET IR) 25-100 MG tablet Take 1 tablet by mouth 4 (four) times daily.   . cholecalciferol (VITAMIN D) 1000 units tablet Take 1,000 Units by mouth daily.  . diazepam (VALIUM) 5 MG tablet Take 5 mg by mouth every 6 (six) hours as needed for anxiety. Also give PRN dose of 5 mg before MRI on 11-20-17  . diphenhydrAMINE (BENADRYL) 25 MG tablet Take 25 mg by mouth every 8 (eight) hours as needed for itching.  . donepezil (ARICEPT) 10 MG tablet Take 10 mg by mouth at bedtime.  . DULoxetine (CYMBALTA) 30 MG capsule Give 3 capsules by mouth one time a day  . fluticasone (FLONASE) 50 MCG/ACT nasal spray Place 2 sprays into both nostrils daily.  Marland Kitchen loratadine (CLARITIN) 10 MG tablet Take 10 mg by mouth daily.  Marland Kitchen LORazepam (ATIVAN) 1 MG tablet Take 1 tablet (1 mg total) by mouth 3 (three) times daily.  . meloxicam (MOBIC) 7.5 MG tablet Take 7.5 mg by mouth daily.  . memantine (NAMENDA) 10 MG tablet Take 10 mg by mouth 2 (two) times daily.  . Menthol, Topical Analgesic, (BIOFREEZE) 4 % GEL Apply to posterior neck topically tow times daily for neck pain  . Nutritional Supplements (NUTRITIONAL SUPPLEMENT PO) House Supplement with meals.  Large portions at meals.  . ondansetron (ZOFRAN) 4 MG tablet Take 4 mg by mouth every 6 (six) hours as needed for nausea or vomiting.  . pantoprazole (PROTONIX) 40 MG tablet Take 40 mg by mouth daily.  . polyethylene glycol (MIRALAX / GLYCOLAX) packet Take 17 g by mouth daily.  . traMADol (ULTRAM) 50 MG tablet Take 1 tablet (50 mg total) by mouth 4 (four) times daily.   No facility-administered encounter medications on file as of 11/19/2017.     Review of Systems  Unable to perform ROS: Dementia    Immunization History  Administered Date(s) Administered  . Influenza-Unspecified  03/17/2017, 09/09/2017  . PPD Test 03/24/2017  . Pneumococcal Polysaccharide-23 03/17/2017   Pertinent  Health Maintenance Due  Topic Date Due  . MAMMOGRAM  03/25/2018 (Originally 12/21/1993)  . DEXA SCAN  03/25/2018 (Originally 12/21/2008)  . COLONOSCOPY  03/25/2018 (Originally 12/21/1993)  . PNA vac Low Risk Adult (2 of 2 - PCV13) 03/25/2018 (Originally 03/17/2018)  . INFLUENZA VACCINE  Completed   Fall Risk  05/14/2017  Falls in the past year? Yes  Number falls in past yr: 2 or more  Injury with Fall? No   Functional Status Survey:    Vitals:   11/19/17 1435  BP: 136/64  Pulse: 76  Resp: 20  Temp: 98.2 F (36.8 C)  TempSrc: Oral  SpO2:  95%  Weight: 170 lb 3.2 oz (77.2 kg)  Height: 5\' 5"  (1.651 m)   Body mass index is 28.32 kg/m. Physical Exam  Constitutional: She appears well-developed.  Frail appearing in NAD, sitting up in bed  HENT:  Mouth/Throat: No oropharyngeal exudate.  MM dry; no oral thrush  Eyes: Pupils are equal, round, and reactive to light. No scleral icterus.  Neck: Neck supple. Carotid bruit is not present. No tracheal deviation present. No thyromegaly present.  Cardiovascular: Normal rate, regular rhythm and intact distal pulses. Exam reveals no gallop and no friction rub.  Murmur (1/6 SEM) heard. No LE edema b/l. no calf TTP.   Pulmonary/Chest: Effort normal and breath sounds normal. No stridor. No respiratory distress. She has no wheezes. She has no rales. She exhibits no tenderness.  Abdominal: Soft. Normal appearance and bowel sounds are normal. She exhibits no distension and no mass. There is no hepatomegaly. There is no tenderness. There is no rigidity, no rebound and no guarding. No hernia.  obese  Musculoskeletal: She exhibits edema and deformity (small and large joint).  Lymphadenopathy:    She has no cervical adenopathy.  Neurological: She is alert. Tremors: resting.  Skin: Skin is warm and dry. No rash noted.  Psychiatric: She has a normal  mood and affect. Her behavior is normal. Thought content normal.    Labs reviewed: Recent Labs    03/19/17 06/19/17 11/03/17  NA 144 141 142  K 3.0* 4.0 3.6  BUN 12 17 16   CREATININE 0.6 0.7 0.6   Recent Labs    03/19/17 06/19/17 11/03/17  AST 14 18 12*  ALT 10 7 5*  ALKPHOS 111 125 120   Recent Labs    03/19/17 11/03/17  WBC 4.7  4.7 9.5  NEUTROABS 2  2 8   HGB 14.5  14.5 12.0  HCT 40  40 35*  PLT 229 214   Lab Results  Component Value Date   TSH 2.49 03/19/2017   Lab Results  Component Value Date   HGBA1C 4.7 03/19/2017   Lab Results  Component Value Date   CHOL 121 03/19/2017   HDL 39 03/19/2017   LDLCALC 56 03/19/2017   TRIG 134 03/19/2017    Significant Diagnostic Results in last 30 days:  No results found.  Assessment/Plan   ICD-10-CM   1. Vascular dementia without behavioral disturbance F01.50   2. Parkinson disease (HCC) G20   3. Benign essential hypertension I10   4. Recurrent major depressive disorder, remission status unspecified (HCC) F33.9   5. History of CVA in adulthood 2Z86.73   6. High risk medication use Z79.899     Cont current meds as ordered  PT/OT as indicated  Cont nutritional supplements as ordered  F/u with specialists as scheduled  [facility] NP to follow  Will follow  Labs/tests ordered: none   Pj Zehner S. Ancil Linseyarter, D. O., F. A. C. O. I.  [facility] [address] [place], [place] [address] [phone] Cell (Monday-Friday 8 AM - 5 PM) [phone] After 5 PM and follow prompts

## 2017-11-20 ENCOUNTER — Telehealth: Payer: Self-pay | Admitting: Neurology

## 2017-11-20 ENCOUNTER — Ambulatory Visit (INDEPENDENT_AMBULATORY_CARE_PROVIDER_SITE_OTHER): Payer: Medicare Other | Admitting: Neurology

## 2017-11-20 DIAGNOSIS — G20 Parkinson's disease: Secondary | ICD-10-CM

## 2017-11-20 DIAGNOSIS — M545 Low back pain, unspecified: Secondary | ICD-10-CM

## 2017-11-20 DIAGNOSIS — Z0289 Encounter for other administrative examinations: Secondary | ICD-10-CM

## 2017-11-20 DIAGNOSIS — G8929 Other chronic pain: Secondary | ICD-10-CM

## 2017-11-20 DIAGNOSIS — R269 Unspecified abnormalities of gait and mobility: Secondary | ICD-10-CM

## 2017-11-20 DIAGNOSIS — R413 Other amnesia: Secondary | ICD-10-CM

## 2017-11-20 NOTE — Procedures (Signed)
Full Name: Jennifer Garner Gender: Female MRN #: [id number] Date of Birth: [birth year]    Visit Date: 11/20/17 10:06 Age: [age] Years 4111 Months Old Examining Physician: Levert FeinsteinYijun Girlie Veltri, MD  Referring Physician: Terrace ArabiaYan, MD History: 72 year old female, presented with chronic low back pain, gait abnormality.  Summary of the tests: Nerve conduction study: Bilateral sural, superficial peroneal sensory responses showed moderately decreased snap amplitude, with moderately prolonged peak latency.   Left median sensory and motor responses were normal.  Bilateral tibial, right peroneal motor responses were normal with exception of mildly decreased conduction velocity.  Left peroneal to EDB motor response showed mildly decreased C map amplitude, with mildly slow slow conduction velocity.  The decreased conduction velocity at lower extremity muscles could due to cold limb temperature.  Electromyography: Selective needle examinations of bilateral lower extremity muscles, and bilateral lumbosacral paraspinal muscles were normal.  Conclusion: This is a mild abnormal study.  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  There is no evidence of bilateral lumbosacral radiculopathy.    ------------------------------- Levert FeinsteinYIjun Cheyrl Buley, M.D.  [facility] [address] [place], [place] [address] Tel: [phone] Fax: [phone]        [facility]    Nerve / Sites Muscle Latency Ref. Amplitude Ref. Rel Amp Segments Distance Velocity Ref. Area    ms ms mV mV %  cm m/s m/s mVms  L Median - APB     Wrist APB 3.9 ?4.4 6.0 ?4.0 100 Wrist - APB 7   23.3     Upper arm APB 7.9  5.3  89.5 Upper arm - Wrist 20 50 ?49 19.9  L Peroneal - EDB     Ankle EDB 6.3 ?6.5 1.5 ?2.0 100 Ankle - EDB 9   5.5     Fib head EDB 13.2  1.4  92.9 Fib head - Ankle 29 42 ?44 5.9     Pop fossa EDB 15.6  1.4  106 Pop fossa - Fib head 10 43 ?44 3.2         Pop fossa - Ankle        R Peroneal - EDB     Ankle EDB 5.4 ?6.5 2.3 ?2.0 100 Ankle - EDB 9   8.2     Fib head EDB 11.9  2.1  93 Fib head - Ankle 29 45 ?44 7.7     Pop fossa EDB 14.2  2.0  93 Pop fossa - Fib head 10 44 ?44 7.2         Pop fossa - Ankle      L Tibial - AH     Ankle AH 5.0 ?5.8 2.5 ?4.0 100 Ankle - AH 9   5.6     Pop fossa AH 14.1  1.7  68.7 Pop fossa - Ankle 35 39 ?41 3.1  R Tibial - AH     Ankle AH 5.2 ?5.8 4.0 ?4.0 100 Ankle - AH 9   10.8     Pop fossa AH 15.1  2.0  51 Pop fossa - Ankle 37 37 ?41 6.1               SNC    Nerve / Sites Rec. Site Peak Lat Ref.  Amp Ref. Segments Distance    ms ms V V  cm  L Sural - Ankle (Calf)     Calf Ankle 4.2 ?4.4 2 ?6 Calf - Ankle 14  R Sural - Ankle (  Calf)     Calf Ankle 4.4 ?4.4 2 ?6 Calf - Ankle 14  R Superficial peroneal - Ankle     Lat leg Ankle 4.9 ?4.4 2 ?6 Lat leg - Ankle 14  L Superficial peroneal - Ankle     Lat leg Ankle 5.1 ?4.4 2 ?6 Lat leg - Ankle 14  L Median - Orthodromic (Dig II, Mid palm)     Dig II Wrist 2.8 ?3.4 17 ?10 Dig II - Wrist 58               F  Wave    Nerve F Lat Ref.   ms ms  L Tibial - AH 57.7 ?56.0  R Tibial - AH 56.5 ?56.0         EMG full       EMG Summary Table    Spontaneous MUAP Recruitment  Muscle IA Fib PSW Fasc Other Amp Dur. Poly Pattern  L. Tibialis anterior Normal None None None _______ Normal Normal Normal Normal  L. Tibialis posterior Normal None None None _______ Normal Normal Normal Normal  L. Peroneus longus Normal None None None _______ Normal Normal Normal Normal  L. Gastrocnemius (Medial head) Normal None None None _______ Normal Normal Normal Normal  L. Vastus lateralis Normal None None None _______ Normal Normal Normal Normal  R. Tibialis anterior Normal None None None _______ Normal Normal Normal Normal  R. Tibialis posterior Normal None None None _______ Normal Normal Normal Normal  R. Peroneus longus Normal None None None _______ Normal Normal Normal Normal  R. Gastrocnemius  (Medial head) Normal None None None _______ Normal Normal Normal Normal  R. Vastus lateralis Normal None None None _______ Normal Normal Normal Normal  L. Lumbar paraspinals (mid) Normal None None None _______ Normal Normal Normal Normal  L. Lumbar paraspinals (low) Normal None None None _______ Normal Normal Normal Normal  R. Lumbar paraspinals (mid) Normal None None None _______ Normal Normal Normal Normal  R. Lumbar paraspinals (low) Normal None None None _______ Normal Normal Normal Normal

## 2017-11-20 NOTE — Telephone Encounter (Signed)
I did not see her MRI report in system, please find out.  She needs to have MRIs before next visit on Dec 17 2017.

## 2017-11-23 NOTE — Telephone Encounter (Signed)
I spoke to Jennifer Garner at [facility]. The patient is scheduled for this afternoon at 4:30 pm.

## 2017-12-08 DIAGNOSIS — F331 Major depressive disorder, recurrent, moderate: Secondary | ICD-10-CM | POA: Diagnosis not present

## 2017-12-08 DIAGNOSIS — F0281 Dementia in other diseases classified elsewhere with behavioral disturbance: Secondary | ICD-10-CM | POA: Diagnosis not present

## 2017-12-08 DIAGNOSIS — G3183 Dementia with Lewy bodies: Secondary | ICD-10-CM | POA: Diagnosis not present

## 2017-12-17 ENCOUNTER — Encounter: Payer: Self-pay | Admitting: Neurology

## 2017-12-17 ENCOUNTER — Ambulatory Visit (INDEPENDENT_AMBULATORY_CARE_PROVIDER_SITE_OTHER): Payer: Medicare Other | Admitting: Neurology

## 2017-12-17 ENCOUNTER — Telehealth: Payer: Self-pay | Admitting: Neurology

## 2017-12-17 VITALS — Ht 65.0 in

## 2017-12-17 DIAGNOSIS — G20 Parkinson's disease: Secondary | ICD-10-CM | POA: Diagnosis not present

## 2017-12-17 DIAGNOSIS — R413 Other amnesia: Secondary | ICD-10-CM | POA: Insufficient documentation

## 2017-12-17 DIAGNOSIS — R269 Unspecified abnormalities of gait and mobility: Secondary | ICD-10-CM

## 2017-12-17 MED ORDER — ZIPRASIDONE HCL 20 MG PO CAPS: 20.0000 mg | ORAL_CAPSULE | Freq: Every day | ORAL | 11 refills | Status: DC

## 2017-12-17 NOTE — Telephone Encounter (Signed)
Patient's daughter was able to speak to Dr. Terrace ArabiaYan privately prior to her appt today.

## 2017-12-17 NOTE — Progress Notes (Signed)
PATIENT: Jennifer Garner DOB: Mar 19, 1946  Chief Complaint  Patient presents with  . Memory Loss    She is here with her daughter, Juliette Alcide, to discuss MRI results.  Her last MMSE was on 10/15/17 and she scored 26/30.  . Back Pain    They would like to review her MRI and NCV/EMG results.  . Gait Abnormality    She still taking Sinemet and participating in PT at [facility].     HISTORICAL  Valia Wingard is a 72 year old female, seen in refer by primary care nurse practitioner Synthia Innocent for evaluation of Parkinson's disease, she is accompanied by her daughter Juliette Alcide, initial evaluation was on October 14, 2017.  I reviewed and summarized the referring note, she has past medical history of anxiety, hypertension, chronic low back pain, depression, fibromyalgia, hyperlipidemia, hypothyroidism,  I was able to review previous neurologist note by previous neurologist Dr. Michaelle Copas at [facility] on December 30, 2016, change Sinemet 25/103 times a day, continue lorazepam for anxiety, Cymbalta, there was quite a pending the past, Prozac,  She moved from [place] to [place] in May 2018, currently lives at [facility] assisted living because of declining functional status, the history is mainly from her daughter.  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, carried a diagnosis of fibromyalgia, she complained of diffuse body achy pain, function at suboptimal level for many years, she barely get out of her house, complains of body achy pain, gait abnormality,  She lived in senior apartment, in 2014, she began to have visual hallucinations, has difficulty managing her medications, she thought her neighbors was talking with her through the wall, through the-ceiling, she called please had multiple occasions,  Over the years, she was treated with different psychotropic medications, with suboptimal control of her mood disorder,   She was was put on  more medications with her hallucinations, including Seroquel, Nuplazid and other dopamine antagonist, with better control of her hallucinations around 2016, she was also noted to have gait abnormality, small shuffling gait, frequent falls, by 2017, she was diagnosed with significant memory loss, parkinsonian features, could not walk for a few months, require extensive rehabilitation, daughter also reported gait is much improved with Sinemet 25/100 mg 3 times a day.  She was treated with ziprasidone20 mg since 2017, and donepezil has really helped her hallucinations, with physical therapy, she is able to walk again, continue complaints of chronic low back pain, diffuse body achy pain,  She denies significant hallucinations now, ziprasidone is no longer on her medication list  Laboratory evaluation seen May 2018, normal TSH, A1c, lipid profile, CBC, BMP  UPDATE Dec 17 2017: She is accompanied by her daughter Juliette Alcide today's clinical visit, reported that patient has worsening agitation, angry outbursts, become very suspicious, has strained relationship with the staff at her facility, daughter 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  Today she focusing on her medication schedule, wanted more scheduled Ativan, complains of difficulty sleeping,  We have personally reviewed MRI of the brain, T2/FLAIR hyperintensity signal in bilateral cerebral subcortical white matter, consistent with small vessel disease, no acute abnormality.  MRI of lumbar spine showed evidence of convex to the right, facet hypertrophy, and degenerative disc  disease, previous posterior laminectomy at L4-5, L5-S1, minimum spondylolisthesis, left greater than right lateral  recess and foraminal narrowing  REVIEW OF SYSTEMS: Full 14 system review of systems performed and notable only for hearing loss, ringing the ears, light sensitivity, loss of vision, agitation, depression, restless leg, tremor, dizziness, headaches,  ALLERGIES: Allergies  Allergen Reactions  . Hydrocodone Other (See Comments)    GI upset    HOME MEDICATIONS: Current Outpatient Medications  Medication Sig Dispense Refill  . acetaminophen (TYLENOL) 500 MG tablet Take 1,000 mg by mouth 3 (three) times daily.     Marland Kitchen albuterol (PROVENTIL HFA;VENTOLIN HFA) 108 (90 Base) MCG/ACT inhaler Inhale 2 puffs into the lungs every 6 (six) hours as needed for wheezing or shortness of breath.    Marland Kitchen aluminum-magnesium hydroxide-simethicone (MAALOX) 200-200-20 MG/5ML SUSP Take 30 mLs by mouth every 6 (six) hours as needed.    Marland Kitchen amLODipine (NORVASC) 10 MG tablet Take 10 mg by mouth daily.    Marland Kitchen atorvastatin (LIPITOR) 10 MG tablet Take 10 mg by mouth daily.    . carbidopa-levodopa (SINEMET IR) 25-100 MG tablet Take 1 tablet by mouth 4 (four) times daily.     . cholecalciferol (VITAMIN D) 1000 units tablet Take 1,000 Units by mouth daily.    . diazepam (VALIUM) 5 MG tablet Take 5 mg by mouth every 6 (six) hours as needed for anxiety. Also give PRN dose of 5 mg before MRI on 11-20-17    . diphenhydrAMINE (BENADRYL) 25 MG tablet Take 25 mg by mouth every 8 (eight) hours as needed for itching.    . donepezil (ARICEPT) 10 MG tablet Take 10 mg by mouth at bedtime.    . DULoxetine (CYMBALTA) 30 MG capsule Give 3 capsules by mouth one time a day    . fluticasone (FLONASE) 50 MCG/ACT nasal spray Place 2 sprays into both nostrils daily.    Marland Kitchen loratadine (CLARITIN) 10 MG tablet Take 10 mg by mouth daily.    Marland Kitchen LORazepam (ATIVAN) 1 MG tablet Take 1 tablet (1 mg total) by mouth 3 (three) times daily. 90 tablet 0  . meloxicam (MOBIC) 7.5 MG tablet Take 7.5 mg by mouth daily.    . memantine (NAMENDA) 10 MG tablet Take 10 mg by  mouth 2 (two) times daily.    . Menthol, Topical Analgesic, (BIOFREEZE) 4 % GEL Apply to posterior neck topically tow times daily for neck pain    . Nutritional Supplements (NUTRITIONAL SUPPLEMENT PO) House Supplement with meals.  Large portions at meals.    . ondansetron (ZOFRAN) 4 MG tablet Take 4 mg by mouth every 6 (six) hours as needed for nausea or vomiting.    . pantoprazole (PROTONIX) 40 MG tablet Take 40 mg by mouth daily.    . polyethylene glycol (MIRALAX / GLYCOLAX) packet Take 17 g by mouth daily.    . traMADol (ULTRAM) 50 MG tablet Take 1 tablet (50 mg total) by mouth 4 (four) times daily. 120 tablet 0   No current facility-administered medications for this visit.     PAST MEDICAL HISTORY: Past Medical History:  Diagnosis Date  . Allergy   . Anxiety   . Arthritis   . Asthma   . Cataract   . Dementia   . Depression   . Difficulty walking   . Dysphagia, oral phase   . Fibromyalgia   . GERD (gastroesophageal reflux disease)   . Hallucinations   . Hyperlipidemia   . Hypertension   . Lower extremity  edema   . Parkinson disease (HCC)   . Repeated falls   . Rhabdomyolysis   . Stroke (HCC)   . TIA (transient ischemic attack)   . Urinary incontinence     PAST SURGICAL HISTORY: Past Surgical History:  Procedure Laterality Date  . ABDOMINAL HYSTERECTOMY    . CATARACT EXTRACTION    . CHOLECYSTECTOMY    . TONSILLECTOMY AND ADENOIDECTOMY      FAMILY HISTORY: Family History  Problem Relation Age of Onset  . Non-Hodgkin's lymphoma Mother   . Stroke Mother   . Heart attack Father     SOCIAL HISTORY:  Social History   Socioeconomic History  . Marital status: Divorced    Spouse name: Not on file  . Number of children: 4  . Years of education: 48  . Highest education level: High school graduate  Social Needs  . Financial resource strain: Not on file  . Food insecurity - worry: Not on file  . Food insecurity - inability: Not on file  . Transportation needs  - medical: Not on file  . Transportation needs - non-medical: Not on file  Occupational History  . Not on file  Tobacco Use  . Smoking status: Never Smoker  . Smokeless tobacco: Never Used  Substance and Sexual Activity  . Alcohol use: No  . Drug use: No  . Sexual activity: No  Other Topics Concern  . Not on file  Social History Narrative   Lives at [facility] and [facility].   Right-handed.   2 cups caffeine per day.     PHYSICAL EXAM   Vitals:   12/17/17 1426  Height: 5\' 5"  (1.651 m)    Not recorded      Body mass index is 28.32 kg/m.  PHYSICAL EXAMNIATION:  Gen: NAD, conversant, well nourised, obese, well groomed                     Cardiovascular: Regular rate rhythm, no peripheral edema, warm, nontender. Eyes: Conjunctivae clear without exudates or hemorrhage Neck: Supple, no carotid bruits. Pulmonary: Clear to auscultation bilaterally   NEUROLOGICAL EXAM: MMSE - Mini Mental State Exam 10/15/2017  Orientation to time 5  Orientation to Place 4  Registration 3  Attention/ Calculation 5  Recall 0  Language- name 2 objects 2  Language- repeat 1  Language- follow 3 step command 3  Language- read & follow direction 1  Write a sentence 1  Copy design 1  Total score 26    CRANIAL NERVES: CN II: Visual fields are full to confrontation. Fundoscopic exam is normal with sharp discs and no vascular changes. Pupils are round equal and briskly reactive to light. CN III, IV, VI: extraocular movement are normal. No ptosis. CN V: Facial sensation is intact to pinprick in all 3 divisions bilaterally. Corneal responses are intact.  CN VII: Face is symmetric with normal eye closure and smile. CN VIII: Hearing is normal to rubbing fingers CN IX, X: Palate elevates symmetrically. Phonation is normal. CN XI: Head turning and shoulder shrug are intact CN XII: Tongue is midline with normal movements and no atrophy.  MOTOR: Mildly symmetric bilateral upper  extremity rigidity, there is no significant weakness, mild symmetric bradykinesia  REFLEXES: Reflexes are 2+ and symmetric at the biceps, triceps, knees, and ankles. Plantar responses are flexor.  SENSORY: Intact to light touch, pinprick, positional sensation and vibratory sensation are intact in fingers and toes.  COORDINATION: Rapid alternating movements and fine finger  movements are intact. There is no dysmetria on finger-to-nose and heel-knee-shin.    GAIT/STANCE: She needs pushed up to get up from seated position, leaning forward, cautious, antalgic   DIAGNOSTIC DATA (LABS, IMAGING, TESTING) - I reviewed patient records, labs, notes, testing and imaging myself where available.   ASSESSMENT AND PLAN  Maricela CuretSherry Fennel is a 72 y.o. female   Dementia with hallucinations  Overall has improved with Aricept treatment,   Will restart dopamine antagonist ziprasidone 20 mg every night with her increased agitation  Gait abnormality with mild parkinsonian features,  This could be due to long-term psychotropic medication treatment, versus central nervous system degenerative disorder versus pain  Mild improvement with Sinemet 25/100 mg 3 times a day,  Continue physical therapy   Levert FeinsteinYijun Arrick Dutton, M.D. Ph.D.  [address] [facility] [address], [address] [place], [place] [address] Ph: [phone] Fax: [phone]  CC: Sharee HolsterGreen, Deborah S, NP

## 2017-12-17 NOTE — Telephone Encounter (Signed)
Pts daughter is calling to request a call before the pt appt if possible to speak with a few things dealing with the pt. Daughter stating that if things are brought up in-front of everyone the pt will get very annoyed and closed off

## 2017-12-22 DIAGNOSIS — G3183 Dementia with Lewy bodies: Secondary | ICD-10-CM | POA: Diagnosis not present

## 2017-12-22 DIAGNOSIS — F064 Anxiety disorder due to known physiological condition: Secondary | ICD-10-CM | POA: Diagnosis not present

## 2017-12-22 DIAGNOSIS — F0281 Dementia in other diseases classified elsewhere with behavioral disturbance: Secondary | ICD-10-CM | POA: Diagnosis not present

## 2017-12-22 DIAGNOSIS — F5105 Insomnia due to other mental disorder: Secondary | ICD-10-CM | POA: Diagnosis not present

## 2017-12-22 DIAGNOSIS — F331 Major depressive disorder, recurrent, moderate: Secondary | ICD-10-CM | POA: Diagnosis not present

## 2017-12-24 ENCOUNTER — Non-Acute Institutional Stay (SKILLED_NURSING_FACILITY): Payer: Medicare Other | Admitting: Adult Health

## 2017-12-24 ENCOUNTER — Encounter: Payer: Self-pay | Admitting: Adult Health

## 2017-12-24 DIAGNOSIS — K5901 Slow transit constipation: Secondary | ICD-10-CM | POA: Diagnosis not present

## 2017-12-24 DIAGNOSIS — I10 Essential (primary) hypertension: Secondary | ICD-10-CM | POA: Diagnosis not present

## 2017-12-24 DIAGNOSIS — J45909 Unspecified asthma, uncomplicated: Secondary | ICD-10-CM

## 2017-12-24 DIAGNOSIS — E785 Hyperlipidemia, unspecified: Secondary | ICD-10-CM

## 2017-12-24 NOTE — Progress Notes (Signed)
Location:   [facility] Room Number: 111 Place of Service:  SNF (31)   CODE STATUS: dnr  Allergies  Allergen Reactions  . Hydrocodone Other (See Comments)    GI upset    Chief Complaint  Patient presents with  . Medical Management of Chronic Issues    Hypertension; asthma; constipation; dyslipidemia     HPI:  She is a 72 year old long term resident of this facility being seen for the management of her chronic illnesses: hypertension; asthma; constipation; dyslipidemia. She denies headaches; constipation; no cough or shortness of breath. There are no nursing concerns at this time.   Past Medical History:  Diagnosis Date  . Allergy   . Anxiety   . Arthritis   . Asthma   . Cataract   . Dementia   . Depression   . Difficulty walking   . Dysphagia, oral phase   . Fibromyalgia   . GERD (gastroesophageal reflux disease)   . Hallucinations   . Hyperlipidemia   . Hypertension   . Lower extremity edema   . Parkinson disease (HCC)   . Repeated falls   . Rhabdomyolysis   . Stroke (HCC)   . TIA (transient ischemic attack)   . Urinary incontinence     Past Surgical History:  Procedure Laterality Date  . ABDOMINAL HYSTERECTOMY    . CATARACT EXTRACTION    . CHOLECYSTECTOMY    . TONSILLECTOMY AND ADENOIDECTOMY      Social History   Socioeconomic History  . Marital status: Divorced    Spouse name: Not on file  . Number of children: 4  . Years of education: 61  . Highest education level: High school graduate  Social Needs  . Financial resource strain: Not on file  . Food insecurity - worry: Not on file  . Food insecurity - inability: Not on file  . Transportation needs - medical: Not on file  . Transportation needs - non-medical: Not on file  Occupational History  . Not on file  Tobacco Use  . Smoking status: Never Smoker  . Smokeless tobacco: Never Used  Substance and Sexual Activity  . Alcohol use: No  . Drug use: No  . Sexual activity:  No  Other Topics Concern  . Not on file  Social History Narrative   Lives at [facility] and [facility]   Right-handed.   2 cups caffeine per day.   Family History  Problem Relation Age of Onset  . Non-Hodgkin's lymphoma Mother   . Stroke Mother   . Heart attack Father       VITAL SIGNS BP 120/60   Pulse 74   Temp 98.2 F (36.8 C)   Resp 16   Ht 5\' 5"  (1.651 m)   Wt 170 lb (77.1 kg)   SpO2 96%   BMI 28.29 kg/m   Outpatient Encounter Medications as of 12/24/2017  Medication Sig  . acetaminophen (TYLENOL) 500 MG tablet Take 1,000 mg by mouth 3 (three) times daily.   Marland Kitchen albuterol (PROVENTIL HFA;VENTOLIN HFA) 108 (90 Base) MCG/ACT inhaler Inhale 2 puffs into the lungs every 6 (six) hours as needed for wheezing or shortness of breath.  Marland Kitchen aluminum-magnesium hydroxide-simethicone (MAALOX) 200-200-20 MG/5ML SUSP Take 30 mLs by mouth every 6 (six) hours as needed.  Marland Kitchen amLODipine (NORVASC) 10 MG tablet Take 10 mg by mouth daily.  Marland Kitchen aspirin EC 81 MG tablet Take 81 mg by mouth daily.  Marland Kitchen atorvastatin (LIPITOR) 10 MG  tablet Take 10 mg by mouth daily.  . calcium carbonate (TUMS - DOSED IN MG ELEMENTAL CALCIUM) 500 MG chewable tablet Chew 1 tablet by mouth every 6 (six) hours as needed for indigestion or heartburn.  . carbidopa-levodopa (SINEMET IR) 25-100 MG tablet Take 1 tablet by mouth 3 (three) times daily.   . cholecalciferol (VITAMIN D) 1000 units tablet Take 1,000 Units by mouth daily.  . diphenhydrAMINE (BENADRYL) 25 MG tablet Take 25 mg by mouth every 8 (eight) hours as needed for itching.  . donepezil (ARICEPT) 10 MG tablet Take 10 mg by mouth at bedtime.  . DULoxetine (CYMBALTA) 30 MG capsule Take 30 mg by mouth daily.  . fluticasone (FLONASE) 50 MCG/ACT nasal spray Place 2 sprays into both nostrils daily.  Marland Kitchen loratadine (CLARITIN) 10 MG tablet Take 10 mg by mouth daily.  Marland Kitchen LORazepam (ATIVAN) 1 MG tablet Take 1 tablet (1 mg total) by mouth 3 (three) times daily.  .  meloxicam (MOBIC) 7.5 MG tablet Take 7.5 mg by mouth daily.  . memantine (NAMENDA) 10 MG tablet Take 10 mg by mouth 2 (two) times daily.  . Menthol, Topical Analgesic, (BIOFREEZE) 4 % GEL Apply to posterior neck topically tow times daily for neck pain  . Nutritional Supplements (NUTRITIONAL SUPPLEMENT PO) House Supplement with meals.  Large portions at meals.  . ondansetron (ZOFRAN) 4 MG tablet Take 4 mg by mouth every 6 (six) hours as needed for nausea or vomiting.  . pantoprazole (PROTONIX) 40 MG tablet Take 40 mg by mouth daily.  . polyethylene glycol (MIRALAX / GLYCOLAX) packet Take 17 g by mouth daily.  . sertraline (ZOLOFT) 50 MG tablet Take 50 mg by mouth daily.  . traMADol (ULTRAM) 50 MG tablet Take 1 tablet (50 mg total) by mouth 4 (four) times daily.  . ziprasidone (GEODON) 20 MG capsule Take 1 capsule (20 mg total) by mouth at bedtime. (Patient taking differently: Take 20 mg by mouth daily with supper. )   No facility-administered encounter medications on file as of 12/24/2017.      SIGNIFICANT DIAGNOSTIC EXAMS   NO NEW EXAMS  LABS REVIEWED:PREVIOUS   03-19-17: wbc 4.7; hgb 14.5; hct 40.0; mcv 88.8; plt 229; glucose 95; bun 21.6; creat 0.55; k+ 3.0; na++ 144; ca 9.7 liver normal albumin 4.5; tsh 2.69; hgb a1c 4.7; chol 121; ldl 56; trig 134; hdl 39  11-22-08: Vit D 33.37 06-19-17: glucose 122; bun 17.3; creat 0.71; k+ 4.0; na++ 141; ca 9.4; liver normal 4.8  TODAY:  11-03-17: wbc 9.5; hgb 12.0; hct 34.6; mcv 90.7; plt 214; glucose 105; bun 16.1; creat 0.59 ;k+ 3.6; na++ 142; ca 8.9; liver normal albumin 4.2     Review of Systems  Constitutional: Negative for malaise/fatigue.  Respiratory: Negative for cough and shortness of breath.   Cardiovascular: Negative for chest pain, palpitations and leg swelling.  Gastrointestinal: Negative for abdominal pain, constipation and heartburn.  Musculoskeletal: Negative for back pain, joint pain and myalgias.  Skin: Negative.     Neurological: Negative for dizziness.  Psychiatric/Behavioral: The patient is not nervous/anxious.     Physical Exam  Constitutional: She is oriented to person, place, and time. She appears well-developed and well-nourished. No distress.  Neck: No thyromegaly present.  Cardiovascular: Normal rate, regular rhythm and intact distal pulses.  Murmur heard. 1/6  Pulmonary/Chest: Effort normal and breath sounds normal. No respiratory distress.  Abdominal: Soft. Bowel sounds are normal. She exhibits no distension. There is no tenderness.  Musculoskeletal: Normal range  of motion. She exhibits no edema.  Lymphadenopathy:    She has no cervical adenopathy.  Neurological: She is alert and oriented to person, place, and time.  Skin: Skin is warm and dry. She is not diaphoretic.  Psychiatric: She has a normal mood and affect.     ASSESSMENT/ PLAN:  TODAY:   1. Slow transit constipation: stable will continue  miralax 17 gm daily   2. Benign essential hypertension: stable b/p 120/60 will continue norvasc 10 mg daily   3. Dyslipidemia: stable  ldl 56: will continue lipitor 10 mg daily   4.  Asthma with allergic rhinitis without complication:without change  will continue flonase daily; claritin 10 mg daily and albuterol 2 puffs every 6 hours as needed  PREVIOUS   5. Gerd: stable  will continue protonix 40 mg daily   6. Dysphagia: no signs of aspiration present is on thin liquids ; will continue current plan of care  7. Parkinson's disease: without change will continue sinemet IR 25/100 mg four times daily   8. Vascular dementia: stable her weight is 170 pounds; will continue namenda 10 mg twice daily and aricept 10 mg daily  will continue to monitor her status.   9. Recurrent major depression with psychotic features: stable will continue cymbalta 30 mg daily, zoloft 50 mg daily  and ativan 1 mg three times daily to help manage her anxiety. She was on lower dose of ativan; but was not  effective.  Will continue geodon 20 mg nightly   10. Osteoarthritis: stable will continue mobic 7.5 mg daily biofreeze twice daily to posterior neck  Tylenol 1 gm three times daily and ultram 50 mg four times daily    MD is aware of resident's narcotic use and is in agreement with current plan of care. We will attempt to wean resident as apropriate   Synthia Innocenteborah Cornie Mccomber NP [facility]  Contact [phone] Monday through Friday 8am- 5pm  After hours call [phone]

## 2017-12-25 ENCOUNTER — Other Ambulatory Visit: Payer: Self-pay

## 2017-12-25 MED ORDER — LORAZEPAM 1 MG PO TABS: 1.0000 mg | ORAL_TABLET | Freq: Three times a day (TID) | ORAL | 0 refills | Status: DC

## 2018-01-06 DIAGNOSIS — K5901 Slow transit constipation: Secondary | ICD-10-CM | POA: Insufficient documentation

## 2018-01-07 DIAGNOSIS — M6281 Muscle weakness (generalized): Secondary | ICD-10-CM | POA: Diagnosis not present

## 2018-01-07 DIAGNOSIS — R262 Difficulty in walking, not elsewhere classified: Secondary | ICD-10-CM | POA: Diagnosis not present

## 2018-01-08 DIAGNOSIS — F5105 Insomnia due to other mental disorder: Secondary | ICD-10-CM | POA: Diagnosis not present

## 2018-01-08 DIAGNOSIS — F064 Anxiety disorder due to known physiological condition: Secondary | ICD-10-CM | POA: Diagnosis not present

## 2018-01-08 DIAGNOSIS — M6281 Muscle weakness (generalized): Secondary | ICD-10-CM | POA: Diagnosis not present

## 2018-01-08 DIAGNOSIS — F331 Major depressive disorder, recurrent, moderate: Secondary | ICD-10-CM | POA: Diagnosis not present

## 2018-01-08 DIAGNOSIS — G3183 Dementia with Lewy bodies: Secondary | ICD-10-CM | POA: Diagnosis not present

## 2018-01-08 DIAGNOSIS — F0281 Dementia in other diseases classified elsewhere with behavioral disturbance: Secondary | ICD-10-CM | POA: Diagnosis not present

## 2018-01-08 DIAGNOSIS — R262 Difficulty in walking, not elsewhere classified: Secondary | ICD-10-CM | POA: Diagnosis not present

## 2018-01-11 DIAGNOSIS — M6281 Muscle weakness (generalized): Secondary | ICD-10-CM | POA: Diagnosis not present

## 2018-01-11 DIAGNOSIS — R262 Difficulty in walking, not elsewhere classified: Secondary | ICD-10-CM | POA: Diagnosis not present

## 2018-01-12 DIAGNOSIS — F0281 Dementia in other diseases classified elsewhere with behavioral disturbance: Secondary | ICD-10-CM | POA: Diagnosis not present

## 2018-01-12 DIAGNOSIS — G3183 Dementia with Lewy bodies: Secondary | ICD-10-CM | POA: Diagnosis not present

## 2018-01-12 DIAGNOSIS — M6281 Muscle weakness (generalized): Secondary | ICD-10-CM | POA: Diagnosis not present

## 2018-01-12 DIAGNOSIS — R262 Difficulty in walking, not elsewhere classified: Secondary | ICD-10-CM | POA: Diagnosis not present

## 2018-01-12 DIAGNOSIS — F331 Major depressive disorder, recurrent, moderate: Secondary | ICD-10-CM | POA: Diagnosis not present

## 2018-01-13 DIAGNOSIS — R42 Dizziness and giddiness: Secondary | ICD-10-CM | POA: Diagnosis not present

## 2018-01-13 DIAGNOSIS — R262 Difficulty in walking, not elsewhere classified: Secondary | ICD-10-CM | POA: Diagnosis not present

## 2018-01-13 DIAGNOSIS — G20 Parkinson's disease: Secondary | ICD-10-CM | POA: Diagnosis not present

## 2018-01-13 DIAGNOSIS — M6281 Muscle weakness (generalized): Secondary | ICD-10-CM | POA: Diagnosis not present

## 2018-01-13 DIAGNOSIS — M545 Low back pain: Secondary | ICD-10-CM | POA: Diagnosis not present

## 2018-01-13 DIAGNOSIS — R2689 Other abnormalities of gait and mobility: Secondary | ICD-10-CM | POA: Diagnosis not present

## 2018-01-14 DIAGNOSIS — F331 Major depressive disorder, recurrent, moderate: Secondary | ICD-10-CM | POA: Diagnosis not present

## 2018-01-14 DIAGNOSIS — L603 Nail dystrophy: Secondary | ICD-10-CM | POA: Diagnosis not present

## 2018-01-14 DIAGNOSIS — I739 Peripheral vascular disease, unspecified: Secondary | ICD-10-CM | POA: Diagnosis not present

## 2018-01-14 DIAGNOSIS — B351 Tinea unguium: Secondary | ICD-10-CM | POA: Diagnosis not present

## 2018-01-14 DIAGNOSIS — F0281 Dementia in other diseases classified elsewhere with behavioral disturbance: Secondary | ICD-10-CM | POA: Diagnosis not present

## 2018-01-14 DIAGNOSIS — R262 Difficulty in walking, not elsewhere classified: Secondary | ICD-10-CM | POA: Diagnosis not present

## 2018-01-14 DIAGNOSIS — G3183 Dementia with Lewy bodies: Secondary | ICD-10-CM | POA: Diagnosis not present

## 2018-01-14 DIAGNOSIS — M6281 Muscle weakness (generalized): Secondary | ICD-10-CM | POA: Diagnosis not present

## 2018-01-15 DIAGNOSIS — M6281 Muscle weakness (generalized): Secondary | ICD-10-CM | POA: Diagnosis not present

## 2018-01-15 DIAGNOSIS — R262 Difficulty in walking, not elsewhere classified: Secondary | ICD-10-CM | POA: Diagnosis not present

## 2018-01-17 ENCOUNTER — Encounter: Payer: Self-pay | Admitting: Internal Medicine

## 2018-01-18 DIAGNOSIS — R262 Difficulty in walking, not elsewhere classified: Secondary | ICD-10-CM | POA: Diagnosis not present

## 2018-01-18 DIAGNOSIS — M6281 Muscle weakness (generalized): Secondary | ICD-10-CM | POA: Diagnosis not present

## 2018-01-19 DIAGNOSIS — M6281 Muscle weakness (generalized): Secondary | ICD-10-CM | POA: Diagnosis not present

## 2018-01-19 DIAGNOSIS — R262 Difficulty in walking, not elsewhere classified: Secondary | ICD-10-CM | POA: Diagnosis not present

## 2018-01-20 ENCOUNTER — Other Ambulatory Visit: Payer: Self-pay

## 2018-01-20 DIAGNOSIS — R262 Difficulty in walking, not elsewhere classified: Secondary | ICD-10-CM | POA: Diagnosis not present

## 2018-01-20 DIAGNOSIS — M6281 Muscle weakness (generalized): Secondary | ICD-10-CM | POA: Diagnosis not present

## 2018-01-20 MED ORDER — TRAMADOL HCL 50 MG PO TABS: 50.0000 mg | ORAL_TABLET | Freq: Four times a day (QID) | ORAL | 0 refills | Status: DC

## 2018-01-20 NOTE — Telephone Encounter (Signed)
RX faxed to [facility] @ [phone], phone number [phone] 

## 2018-01-21 ENCOUNTER — Non-Acute Institutional Stay (SKILLED_NURSING_FACILITY): Payer: Medicare Other | Admitting: Adult Health

## 2018-01-21 ENCOUNTER — Encounter: Payer: Self-pay | Admitting: Adult Health

## 2018-01-21 DIAGNOSIS — R1311 Dysphagia, oral phase: Secondary | ICD-10-CM

## 2018-01-21 DIAGNOSIS — G20 Parkinson's disease: Secondary | ICD-10-CM | POA: Diagnosis not present

## 2018-01-21 DIAGNOSIS — R262 Difficulty in walking, not elsewhere classified: Secondary | ICD-10-CM | POA: Diagnosis not present

## 2018-01-21 DIAGNOSIS — F0150 Vascular dementia without behavioral disturbance: Secondary | ICD-10-CM | POA: Diagnosis not present

## 2018-01-21 DIAGNOSIS — K219 Gastro-esophageal reflux disease without esophagitis: Secondary | ICD-10-CM | POA: Diagnosis not present

## 2018-01-21 DIAGNOSIS — M6281 Muscle weakness (generalized): Secondary | ICD-10-CM | POA: Diagnosis not present

## 2018-01-21 NOTE — Progress Notes (Signed)
Location:   [facility] Room Number: 111 B Place of Service:  SNF (31)   CODE STATUS: DNR (Most form updated 03/17/17)  Allergies  Allergen Reactions  . Hydrocodone Other (See Comments)    GI upset    Chief Complaint  Patient presents with  . Medical Management of Chronic Issues    Gerd; dysphagia; parkinsonism; dementia.     HPI:  She is a long term 72 year old long term resident of this facility being seen for the management of her chronic illnesses; gerd; dysphagia; parkinsonism; vascular dementia. She denies hallucinations; delusions; no complaints of pain; no changes in appetite. There are no nursing concerns at this time.    Past Medical History:  Diagnosis Date  . Allergy   . Anxiety   . Arthritis   . Asthma   . Cataract   . Dementia   . Depression   . Difficulty walking   . Dysphagia, oral phase   . Fibromyalgia   . GERD (gastroesophageal reflux disease)   . Hallucinations   . Hyperlipidemia   . Hypertension   . Lower extremity edema   . Parkinson disease (HCC)   . Repeated falls   . Rhabdomyolysis   . Stroke (HCC)   . TIA (transient ischemic attack)   . Urinary incontinence     Past Surgical History:  Procedure Laterality Date  . ABDOMINAL HYSTERECTOMY    . CATARACT EXTRACTION    . CHOLECYSTECTOMY    . TONSILLECTOMY AND ADENOIDECTOMY      Social History   Socioeconomic History  . Marital status: Divorced    Spouse name: Not on file  . Number of children: 4  . Years of education: 6012  . Highest education level: High school graduate  Social Needs  . Financial resource strain: Not on file  . Food insecurity - worry: Not on file  . Food insecurity - inability: Not on file  . Transportation needs - medical: Not on file  . Transportation needs - non-medical: Not on file  Occupational History  . Not on file  Tobacco Use  . Smoking status: Never Smoker  . Smokeless tobacco: Never Used  Substance and Sexual Activity  .  Alcohol use: No  . Drug use: No  . Sexual activity: No  Other Topics Concern  . Not on file  Social History Narrative   [facility]   Right-handed.   2 cups caffeine per day.   Family History  Problem Relation Age of Onset  . Non-Hodgkin's lymphoma Mother   . Stroke Mother   . Heart attack Father       VITAL SIGNS BP 122/68   Pulse 70   Temp 98.7 F (37.1 C)   Resp 18   Ht 5\' 5"  (1.651 m)   Wt 175 lb (79.4 kg)   SpO2 96%   BMI 29.12 kg/m   Outpatient Encounter Medications as of 01/21/2018  Medication Sig  . albuterol (PROVENTIL HFA;VENTOLIN HFA) 108 (90 Base) MCG/ACT inhaler Inhale 2 puffs into the lungs every 6 (six) hours as needed for wheezing or shortness of breath.  Marland Kitchen. aluminum-magnesium hydroxide-simethicone (MAALOX) 200-200-20 MG/5ML SUSP Take 30 mLs by mouth every 6 (six) hours as needed.  Marland Kitchen. amLODipine (NORVASC) 10 MG tablet Take 10 mg by mouth daily.  Marland Kitchen. aspirin EC 81 MG tablet Take 81 mg by mouth daily.  Marland Kitchen. atorvastatin (LIPITOR) 10 MG tablet Take 10 mg by mouth daily.  .Marland Kitchen  calcium carbonate (TUMS - DOSED IN MG ELEMENTAL CALCIUM) 500 MG chewable tablet Chew 1 tablet by mouth every 6 (six) hours as needed for indigestion or heartburn.  . carbidopa-levodopa (SINEMET IR) 25-100 MG tablet Take 1 tablet by mouth 3 (three) times daily.   . cholecalciferol (VITAMIN D) 1000 units tablet Take 1,000 Units by mouth daily.  . diphenhydrAMINE (BENADRYL) 25 MG tablet Take 25 mg by mouth every 8 (eight) hours as needed for itching.  . donepezil (ARICEPT) 10 MG tablet Take 10 mg by mouth at bedtime.  . fluticasone (FLONASE) 50 MCG/ACT nasal spray Place 2 sprays into both nostrils daily.  Marland Kitchen loratadine (CLARITIN) 10 MG tablet Take 10 mg by mouth daily.  Marland Kitchen LORazepam (ATIVAN) 1 MG tablet Take 1 tablet (1 mg total) by mouth 3 (three) times daily.  . meloxicam (MOBIC) 7.5 MG tablet Take 7.5 mg by mouth daily.  . memantine (NAMENDA) 10 MG tablet Take 10 mg  by mouth 2 (two) times daily.  . Menthol, Topical Analgesic, (BIOFREEZE) 4 % GEL Apply to posterior neck topically tow times daily for neck pain  . ondansetron (ZOFRAN) 4 MG tablet Take 4 mg by mouth every 6 (six) hours as needed for nausea or vomiting.  . pantoprazole (PROTONIX) 40 MG tablet Take 40 mg by mouth daily.  . polyethylene glycol (MIRALAX / GLYCOLAX) packet Take 17 g by mouth daily.  . sertraline (ZOLOFT) 50 MG tablet Take 50 mg by mouth daily.  . traMADol (ULTRAM) 50 MG tablet Take 1 tablet (50 mg total) by mouth 4 (four) times daily.  . ziprasidone (GEODON) 20 MG capsule Take 20 mg by mouth every evening.  . [DISCONTINUED] acetaminophen (TYLENOL) 500 MG tablet Take 1,000 mg by mouth 3 (three) times daily.   . [DISCONTINUED] DULoxetine (CYMBALTA) 30 MG capsule Take 30 mg by mouth daily.  . [DISCONTINUED] Nutritional Supplements (NUTRITIONAL SUPPLEMENT PO) House Supplement with meals.  Large portions at meals.  . [DISCONTINUED] ziprasidone (GEODON) 20 MG capsule Take 1 capsule (20 mg total) by mouth at bedtime. (Patient not taking: Reported on 01/21/2018)   No facility-administered encounter medications on file as of 01/21/2018.      SIGNIFICANT DIAGNOSTIC EXAMS  NO NEW EXAMS  LABS REVIEWED:PREVIOUS   03-19-17: wbc 4.7; hgb 14.5; hct 40.0; mcv 88.8; plt 229; glucose 95; bun 21.6; creat 0.55; k+ 3.0; na++ 144; ca 9.7 liver normal albumin 4.5; tsh 2.69; hgb a1c 4.7; chol 121; ldl 56; trig 134; hdl 39  11-22-08: Vit D 33.37 06-19-17: glucose 122; bun 17.3; creat 0.71; k+ 4.0; na++ 141; ca 9.4; liver normal 4.8 11-03-17: wbc 9.5; hgb 12.0; hct 34.6; mcv 90.7; plt 214; glucose 105; bun 16.1; creat 0.59 ;k+ 3.6; na++ 142; ca 8.9; liver normal albumin 4.2  NO NEW LABS       Review of Systems  Constitutional: Negative for malaise/fatigue.  Respiratory: Negative for cough and shortness of breath.   Cardiovascular: Negative for chest pain, palpitations and leg swelling.  Gastrointestinal:  Negative for abdominal pain, constipation and heartburn.  Musculoskeletal: Negative for back pain, joint pain and myalgias.  Skin: Negative.   Neurological: Negative for dizziness.  Psychiatric/Behavioral: The patient is not nervous/anxious.      Physical Exam  Constitutional: She is oriented to person, place, and time. She appears well-developed and well-nourished. No distress.  Neck: No thyromegaly present.  Cardiovascular: Normal rate, regular rhythm and intact distal pulses.  Murmur heard. 1/6  Pulmonary/Chest: Effort normal and breath sounds normal.  Abdominal: Soft. Bowel sounds are normal. She exhibits no distension. There is no tenderness.  Lymphadenopathy:    She has no cervical adenopathy.  Neurological: She is alert and oriented to person, place, and time.  Skin: Skin is warm and dry. She is not diaphoretic.  Psychiatric: She has a normal mood and affect.    ASSESSMENT/ PLAN:  TODAY:   1. Gerd without esophagitis: stable  will continue protonix 40 mg daily   2. Dysphagia oral phase: no signs of aspiration present is on thin liquids ; will continue current plan of care  3. Parkinsonism: without change will continue sinemet IR 25/100 mg three times daily   4. Vascular dementia without behavioral disturbance: stable her weight is 175 pounds; will continue namenda 10 mg twice daily and aricept 10 mg daily  will continue to monitor her status.   PREVIOUS   5. Recurrent major depression with psychotic features: stable will continue  zoloft 50 mg daily; ativan 1 mg three times daily to help manage her anxiety. She was on lower dose of ativan; but was not effective.  Will continue geodon 20 mg nightly   6. Osteoarthritis: stable will continue mobic 7.5 mg daily biofreeze twice daily to posterior neck  ultram 50 mg four times daily the tylenol was stopped per her request   7. Slow transit constipation: stable will continue  miralax 17 gm daily   8. Benign essential  hypertension: stable b/p 122/68 will continue norvasc 10 mg daily   9. Dyslipidemia: stable  ldl 56: will continue lipitor 10 mg daily   10.  Asthma with allergic rhinitis without complication:without change  will continue flonase daily; claritin 10 mg daily and albuterol 2 puffs every 6 hours as needed  MD is aware of resident's narcotic use and is in agreement with current plan of care. We will attempt to wean resident as apropriate    Synthia Innocent NP [facility]  Contact [phone] Monday through Friday 8am- 5pm  After hours call [phone]

## 2018-01-22 DIAGNOSIS — M6281 Muscle weakness (generalized): Secondary | ICD-10-CM | POA: Diagnosis not present

## 2018-01-22 DIAGNOSIS — R262 Difficulty in walking, not elsewhere classified: Secondary | ICD-10-CM | POA: Diagnosis not present

## 2018-01-25 ENCOUNTER — Other Ambulatory Visit: Payer: Self-pay

## 2018-01-25 DIAGNOSIS — R262 Difficulty in walking, not elsewhere classified: Secondary | ICD-10-CM | POA: Diagnosis not present

## 2018-01-25 DIAGNOSIS — M6281 Muscle weakness (generalized): Secondary | ICD-10-CM | POA: Diagnosis not present

## 2018-01-25 MED ORDER — LORAZEPAM 1 MG PO TABS: 1.0000 mg | ORAL_TABLET | Freq: Three times a day (TID) | ORAL | 0 refills | Status: DC

## 2018-01-25 NOTE — Telephone Encounter (Signed)
RX faxed to [facility] @ [phone], phone number [phone] 

## 2018-01-26 ENCOUNTER — Other Ambulatory Visit: Payer: Self-pay

## 2018-01-26 ENCOUNTER — Non-Acute Institutional Stay (SKILLED_NURSING_FACILITY): Payer: Medicare Other | Admitting: Adult Health

## 2018-01-26 ENCOUNTER — Encounter: Payer: Self-pay | Admitting: Adult Health

## 2018-01-26 DIAGNOSIS — F330 Major depressive disorder, recurrent, mild: Secondary | ICD-10-CM

## 2018-01-26 DIAGNOSIS — R262 Difficulty in walking, not elsewhere classified: Secondary | ICD-10-CM | POA: Diagnosis not present

## 2018-01-26 DIAGNOSIS — M150 Primary generalized (osteo)arthritis: Secondary | ICD-10-CM

## 2018-01-26 DIAGNOSIS — M6281 Muscle weakness (generalized): Secondary | ICD-10-CM | POA: Diagnosis not present

## 2018-01-26 DIAGNOSIS — M159 Polyosteoarthritis, unspecified: Secondary | ICD-10-CM

## 2018-01-26 MED ORDER — LORAZEPAM 1 MG PO TABS: 1.0000 mg | ORAL_TABLET | Freq: Three times a day (TID) | ORAL | 0 refills | Status: DC

## 2018-01-26 NOTE — Telephone Encounter (Signed)
RX faxed to [facility] @ [phone], phone number [phone] 

## 2018-01-26 NOTE — Progress Notes (Signed)
Location:   [facility] Room Number: 111 B Place of Service:  SNF (31)   CODE STATUS: Full Code  Allergies  Allergen Reactions  . Hydrocodone Other (See Comments)    GI upset    Chief Complaint  Patient presents with  . Acute Visit    Patient concerns    HPI:  She is complaining of joint pain especially in her knees. She feels like her extremities are "numb". She is able to move all her extremities; she is able to ambulate. She does complain of anxiety and feels jittery on the inside. She feels as though her ativan is not effective; and does not feel as though her medications are not effective for her knee pain.    Past Medical History:  Diagnosis Date  . Allergy   . Anxiety   . Arthritis   . Asthma   . Cataract   . Dementia   . Depression   . Difficulty walking   . Dysphagia, oral phase   . Fibromyalgia   . GERD (gastroesophageal reflux disease)   . Hallucinations   . Hyperlipidemia   . Hypertension   . Lower extremity edema   . Parkinson disease (HCC)   . Repeated falls   . Rhabdomyolysis   . Stroke (HCC)   . TIA (transient ischemic attack)   . Urinary incontinence     Past Surgical History:  Procedure Laterality Date  . ABDOMINAL HYSTERECTOMY    . CATARACT EXTRACTION    . CHOLECYSTECTOMY    . TONSILLECTOMY AND ADENOIDECTOMY      Social History   Socioeconomic History  . Marital status: Divorced    Spouse name: Not on file  . Number of children: 4  . Years of education: 36  . Highest education level: High school graduate  Social Needs  . Financial resource strain: Not on file  . Food insecurity - worry: Not on file  . Food insecurity - inability: Not on file  . Transportation needs - medical: Not on file  . Transportation needs - non-medical: Not on file  Occupational History  . Not on file  Tobacco Use  . Smoking status: Never Smoker  . Smokeless tobacco: Never Used  Substance and Sexual Activity  . Alcohol use: No  .  Drug use: No  . Sexual activity: No  Other Topics Concern  . Not on file  Social History Narrative   Lives at [facility]   Right-handed.   2 cups caffeine per day.   Family History  Problem Relation Age of Onset  . Non-Hodgkin's lymphoma Mother   . Stroke Mother   . Heart attack Father       VITAL SIGNS BP 122/80   Pulse 70   Temp 98.7 F (37.1 C)   Resp 18   Ht 5\' 5"  (1.651 m)   Wt 175 lb (79.4 kg)   SpO2 96%   BMI 29.12 kg/m   Outpatient Encounter Medications as of 01/26/2018  Medication Sig  . albuterol (PROVENTIL HFA;VENTOLIN HFA) 108 (90 Base) MCG/ACT inhaler Inhale 2 puffs into the lungs every 6 (six) hours as needed for wheezing or shortness of breath.  Marland Kitchen aluminum-magnesium hydroxide-simethicone (MAALOX) 200-200-20 MG/5ML SUSP Take 30 mLs by mouth every 6 (six) hours as needed.  Marland Kitchen amLODipine (NORVASC) 10 MG tablet Take 10 mg by mouth daily.  Marland Kitchen aspirin EC 81 MG tablet Take 81 mg by mouth daily.  Marland Kitchen atorvastatin (LIPITOR) 10 MG tablet Take 10  mg by mouth daily.  . calcium carbonate (TUMS - DOSED IN MG ELEMENTAL CALCIUM) 500 MG chewable tablet Chew 1 tablet by mouth every 6 (six) hours as needed for indigestion or heartburn.  . carbidopa-levodopa (SINEMET IR) 25-100 MG tablet Take 1 tablet by mouth 3 (three) times daily.   . cholecalciferol (VITAMIN D) 1000 units tablet Take 1,000 Units by mouth daily.  . diphenhydrAMINE (BENADRYL) 25 MG tablet Take 25 mg by mouth every 8 (eight) hours as needed for itching.  . donepezil (ARICEPT) 10 MG tablet Take 10 mg by mouth at bedtime.  . fluticasone (FLONASE) 50 MCG/ACT nasal spray Place 2 sprays into both nostrils daily.  Marland Kitchen loratadine (CLARITIN) 10 MG tablet Take 10 mg by mouth daily.  Marland Kitchen LORazepam (ATIVAN) 1 MG tablet Take 1 tablet (1 mg total) by mouth 3 (three) times daily.  . meloxicam (MOBIC) 7.5 MG tablet Take 7.5 mg by mouth daily.  . memantine (NAMENDA) 10 MG tablet Take 10 mg by mouth 2 (two) times daily.  .  Menthol, Topical Analgesic, (BIOFREEZE) 4 % GEL Apply to posterior neck topically tow times daily for neck pain  . ondansetron (ZOFRAN) 4 MG tablet Take 4 mg by mouth every 6 (six) hours as needed for nausea or vomiting.  . pantoprazole (PROTONIX) 40 MG tablet Take 40 mg by mouth daily.  . polyethylene glycol (MIRALAX / GLYCOLAX) packet Take 17 g by mouth daily.  . sertraline (ZOLOFT) 50 MG tablet Take 50 mg by mouth daily.  . traMADol (ULTRAM) 50 MG tablet Take 1 tablet (50 mg total) by mouth 4 (four) times daily.  . ziprasidone (GEODON) 20 MG capsule Take 20 mg by mouth every evening.   No facility-administered encounter medications on file as of 01/26/2018.      SIGNIFICANT DIAGNOSTIC EXAMS  NO NEW EXAMS  LABS REVIEWED:PREVIOUS   03-19-17: wbc 4.7; hgb 14.5; hct 40.0; mcv 88.8; plt 229; glucose 95; bun 21.6; creat 0.55; k+ 3.0; na++ 144; ca 9.7 liver normal albumin 4.5; tsh 2.69; hgb a1c 4.7; chol 121; ldl 56; trig 134; hdl 39  12-21-38: Vit D 33.37 06-19-17: glucose 122; bun 17.3; creat 0.71; k+ 4.0; na++ 141; ca 9.4; liver normal 4.8 11-03-17: wbc 9.5; hgb 12.0; hct 34.6; mcv 90.7; plt 214; glucose 105; bun 16.1; creat 0.59 ;k+ 3.6; na++ 142; ca 8.9; liver normal albumin 4.2  NO NEW LABS      Review of Systems  Constitutional: Negative for malaise/fatigue.  Respiratory: Negative for cough and shortness of breath.   Cardiovascular: Negative for chest pain, palpitations and leg swelling.  Gastrointestinal: Negative for abdominal pain, constipation and heartburn.  Musculoskeletal: Positive for joint pain. Negative for back pain and myalgias.  Skin: Negative.   Neurological: Negative for dizziness.  Psychiatric/Behavioral: The patient is nervous/anxious.        Feels jittery     Physical Exam  Constitutional: She is oriented to person, place, and time. She appears well-developed and well-nourished. No distress.  Neck: No thyromegaly present.  Cardiovascular: Normal rate, regular  rhythm and intact distal pulses.  Murmur heard. 1/6  Pulmonary/Chest: Effort normal and breath sounds normal. No respiratory distress.  Abdominal: Soft. Bowel sounds are normal. She exhibits no distension. There is no tenderness.  Musculoskeletal: Normal range of motion. She exhibits no edema.  Lymphadenopathy:    She has no cervical adenopathy.  Neurological: She is alert and oriented to person, place, and time.  Skin: Skin is warm. She is not diaphoretic.  Psychiatric: She has a normal mood and affect.     ASSESSMENT/ PLAN:  TODAY:   1.  Osteoarthritis: worse will continue   ultram 50 mg four times daily will increase mobic to 15 mg daily and will change biofreeze to both knees and posterior neck twice daily   Will monitor    2. Recurrent major depression with psychotic features: worse will continue  zoloft 50 mg daily; ativan 1 mg three times daily to help manage her anxiety  geodon 20 mg nightly   Will begin her on buspar 5 mg twice daily to help with anxiety management. Will monitor      MD is aware of resident's narcotic use and is in agreement with current plan of care. We will attempt to wean resident as apropriate    Synthia Innocenteborah Shatika Grinnell NP [facility]  Contact [phone] Monday through Friday 8am- 5pm  After hours call [phone]

## 2018-01-27 DIAGNOSIS — R262 Difficulty in walking, not elsewhere classified: Secondary | ICD-10-CM | POA: Diagnosis not present

## 2018-01-27 DIAGNOSIS — M6281 Muscle weakness (generalized): Secondary | ICD-10-CM | POA: Diagnosis not present

## 2018-01-29 DIAGNOSIS — R262 Difficulty in walking, not elsewhere classified: Secondary | ICD-10-CM | POA: Diagnosis not present

## 2018-01-29 DIAGNOSIS — M6281 Muscle weakness (generalized): Secondary | ICD-10-CM | POA: Diagnosis not present

## 2018-02-01 DIAGNOSIS — M6281 Muscle weakness (generalized): Secondary | ICD-10-CM | POA: Diagnosis not present

## 2018-02-01 DIAGNOSIS — R262 Difficulty in walking, not elsewhere classified: Secondary | ICD-10-CM | POA: Diagnosis not present

## 2018-02-02 DIAGNOSIS — R262 Difficulty in walking, not elsewhere classified: Secondary | ICD-10-CM | POA: Diagnosis not present

## 2018-02-02 DIAGNOSIS — M6281 Muscle weakness (generalized): Secondary | ICD-10-CM | POA: Diagnosis not present

## 2018-02-03 DIAGNOSIS — M6281 Muscle weakness (generalized): Secondary | ICD-10-CM | POA: Diagnosis not present

## 2018-02-03 DIAGNOSIS — R262 Difficulty in walking, not elsewhere classified: Secondary | ICD-10-CM | POA: Diagnosis not present

## 2018-02-04 DIAGNOSIS — M6281 Muscle weakness (generalized): Secondary | ICD-10-CM | POA: Diagnosis not present

## 2018-02-04 DIAGNOSIS — R262 Difficulty in walking, not elsewhere classified: Secondary | ICD-10-CM | POA: Diagnosis not present

## 2018-02-05 ENCOUNTER — Emergency Department (HOSPITAL_COMMUNITY)
Admission: EM | Admit: 2018-02-05 | Discharge: 2018-02-05 | Disposition: A | Discharge status: Home / Self Care | Payer: Medicare Other | Attending: Emergency Medicine | Admitting: Emergency Medicine

## 2018-02-05 ENCOUNTER — Emergency Department (HOSPITAL_COMMUNITY): Payer: Medicare Other

## 2018-02-05 ENCOUNTER — Non-Acute Institutional Stay: Payer: Medicare Other | Admitting: Adult Health

## 2018-02-05 ENCOUNTER — Encounter: Payer: Self-pay | Admitting: Adult Health

## 2018-02-05 ENCOUNTER — Encounter (HOSPITAL_COMMUNITY): Payer: Self-pay

## 2018-02-05 DIAGNOSIS — F331 Major depressive disorder, recurrent, moderate: Secondary | ICD-10-CM | POA: Diagnosis not present

## 2018-02-05 DIAGNOSIS — M150 Primary generalized (osteo)arthritis: Secondary | ICD-10-CM | POA: Diagnosis not present

## 2018-02-05 DIAGNOSIS — J45909 Unspecified asthma, uncomplicated: Secondary | ICD-10-CM | POA: Diagnosis not present

## 2018-02-05 DIAGNOSIS — M545 Low back pain, unspecified: Secondary | ICD-10-CM

## 2018-02-05 DIAGNOSIS — F330 Major depressive disorder, recurrent, mild: Secondary | ICD-10-CM

## 2018-02-05 DIAGNOSIS — Z8673 Personal history of transient ischemic attack (TIA), and cerebral infarction without residual deficits: Secondary | ICD-10-CM | POA: Diagnosis not present

## 2018-02-05 DIAGNOSIS — M159 Polyosteoarthritis, unspecified: Secondary | ICD-10-CM

## 2018-02-05 DIAGNOSIS — F064 Anxiety disorder due to known physiological condition: Secondary | ICD-10-CM | POA: Diagnosis not present

## 2018-02-05 DIAGNOSIS — G20 Parkinson's disease: Secondary | ICD-10-CM | POA: Diagnosis not present

## 2018-02-05 DIAGNOSIS — M1612 Unilateral primary osteoarthritis, left hip: Secondary | ICD-10-CM | POA: Insufficient documentation

## 2018-02-05 DIAGNOSIS — Z7982 Long term (current) use of aspirin: Secondary | ICD-10-CM | POA: Diagnosis not present

## 2018-02-05 DIAGNOSIS — R52 Pain, unspecified: Secondary | ICD-10-CM | POA: Diagnosis not present

## 2018-02-05 DIAGNOSIS — I10 Essential (primary) hypertension: Secondary | ICD-10-CM | POA: Insufficient documentation

## 2018-02-05 DIAGNOSIS — G8929 Other chronic pain: Secondary | ICD-10-CM | POA: Diagnosis not present

## 2018-02-05 DIAGNOSIS — R4182 Altered mental status, unspecified: Secondary | ICD-10-CM | POA: Diagnosis not present

## 2018-02-05 DIAGNOSIS — M549 Dorsalgia, unspecified: Secondary | ICD-10-CM | POA: Diagnosis not present

## 2018-02-05 DIAGNOSIS — Z79899 Other long term (current) drug therapy: Secondary | ICD-10-CM | POA: Diagnosis not present

## 2018-02-05 DIAGNOSIS — M25552 Pain in left hip: Secondary | ICD-10-CM | POA: Diagnosis not present

## 2018-02-05 DIAGNOSIS — G3183 Dementia with Lewy bodies: Secondary | ICD-10-CM | POA: Diagnosis not present

## 2018-02-05 DIAGNOSIS — M5489 Other dorsalgia: Secondary | ICD-10-CM | POA: Diagnosis not present

## 2018-02-05 DIAGNOSIS — F5105 Insomnia due to other mental disorder: Secondary | ICD-10-CM | POA: Diagnosis not present

## 2018-02-05 DIAGNOSIS — F0281 Dementia in other diseases classified elsewhere with behavioral disturbance: Secondary | ICD-10-CM | POA: Diagnosis not present

## 2018-02-05 MED ORDER — IBUPROFEN 200 MG PO TABS
400.0000 mg | ORAL_TABLET | Freq: Once | ORAL | Status: AC
  Administered 2018-02-05: 400 mg via ORAL
  Filled 2018-02-05: qty 2

## 2018-02-05 MED ORDER — TRAMADOL HCL 50 MG PO TABS
50.0000 mg | ORAL_TABLET | Freq: Once | ORAL | Status: AC
  Administered 2018-02-05: 50 mg via ORAL
  Filled 2018-02-05: qty 1

## 2018-02-05 NOTE — Progress Notes (Signed)
Location:   [facility] Room Number: 111 B Place of Service:  SNF (31)   CODE STATUS: Full Code  Allergies  Allergen Reactions  . Hydrocodone Other (See Comments)    GI upset    Chief Complaint  Patient presents with  . Acute Visit    ER Follow up    HPI:  She went to the ED yesterday due to severe back pain. She does have osteoarthritis of her left hip. She was given toradol in the ED. She tells me that she is nervous all the time; and that her back is in severe pain all the time. She has been weaned off the cymbalta and is not tolerating being off this medication. She is wanting her ativan increased and is wanting to have  Strong muscle relaxant. I have spoken with the psych provider regarding restarting her cymbalta and he is in agreement with this plan. She denies changes in appetite; does have anxiety; denies insomnia.    Past Medical History:  Diagnosis Date  . Allergy   . Anxiety   . Arthritis   . Asthma   . Cataract   . Dementia   . Depression   . Difficulty walking   . Dysphagia, oral phase   . Fibromyalgia   . GERD (gastroesophageal reflux disease)   . Hallucinations   . Hyperlipidemia   . Hypertension   . Lower extremity edema   . Parkinson disease (HCC)   . Repeated falls   . Rhabdomyolysis   . Stroke (HCC)   . TIA (transient ischemic attack)   . Urinary incontinence     Past Surgical History:  Procedure Laterality Date  . ABDOMINAL HYSTERECTOMY    . CATARACT EXTRACTION    . CHOLECYSTECTOMY    . TONSILLECTOMY AND ADENOIDECTOMY      Social History   Socioeconomic History  . Marital status: Divorced    Spouse name: Not on file  . Number of children: 4  . Years of education: 32  . Highest education level: High school graduate  Occupational History  . Not on file  Social Needs  . Financial resource strain: Not on file  . Food insecurity:    Worry: Not on file    Inability: Not on file  . Transportation needs:   Medical: Not on file    Non-medical: Not on file  Tobacco Use  . Smoking status: Never Smoker  . Smokeless tobacco: Never Used  Substance and Sexual Activity  . Alcohol use: No  . Drug use: No  . Sexual activity: Never  Lifestyle  . Physical activity:    Days per week: Not on file    Minutes per session: Not on file  . Stress: Not on file  Relationships  . Social connections:    Talks on phone: Not on file    Gets together: Not on file    Attends religious service: Not on file    Active member of club or organization: Not on file    Attends meetings of clubs or organizations: Not on file    Relationship status: Not on file  . Intimate partner violence:    Fear of current or ex partner: Not on file    Emotionally abused: Not on file    Physically abused: Not on file    Forced sexual activity: Not on file  Other Topics Concern  . Not on file  Social History Narrative   Lives at [facility]  Right-handed.   2 cups caffeine per day.   Family History  Problem Relation Age of Onset  . Non-Hodgkin's lymphoma Mother   . Stroke Mother   . Heart attack Father       VITAL SIGNS BP 136/78   Pulse 74   Temp 98.3 F (36.8 C)   Resp 18   Ht 5\' 5"  (1.651 m)   Wt 175 lb (79.4 kg)   SpO2 97%   BMI 29.12 kg/m   Outpatient Encounter Medications as of 02/05/2018  Medication Sig  . albuterol (PROVENTIL HFA;VENTOLIN HFA) 108 (90 Base) MCG/ACT inhaler Inhale 2 puffs into the lungs every 6 (six) hours as needed for wheezing or shortness of breath.  Marland Kitchen. aluminum-magnesium hydroxide-simethicone (MAALOX) 200-200-20 MG/5ML SUSP Take 30 mLs by mouth every 6 (six) hours as needed. Constipation  . amLODipine (NORVASC) 10 MG tablet Take 10 mg by mouth daily.  Marland Kitchen. aspirin EC 81 MG tablet Take 81 mg by mouth daily.  Marland Kitchen. atorvastatin (LIPITOR) 10 MG tablet Take 10 mg by mouth daily.  . busPIRone (BUSPAR) 5 MG tablet Take 5 mg by mouth 2 (two) times daily.  . calcium carbonate (TUMS - DOSED IN  MG ELEMENTAL CALCIUM) 500 MG chewable tablet Chew 1 tablet by mouth every 6 (six) hours as needed for indigestion or heartburn.  . carbidopa-levodopa (SINEMET IR) 25-100 MG tablet Take 1 tablet by mouth 3 (three) times daily.   . cholecalciferol (VITAMIN D) 1000 units tablet Take 1,000 Units by mouth daily.  . diphenhydrAMINE (BENADRYL) 25 MG tablet Take 25 mg by mouth every 8 (eight) hours as needed for itching.  . donepezil (ARICEPT) 10 MG tablet Take 10 mg by mouth at bedtime.  . fluticasone (FLONASE) 50 MCG/ACT nasal spray Place 2 sprays into both nostrils daily.  Marland Kitchen. loratadine (CLARITIN) 10 MG tablet Take 10 mg by mouth daily.  Marland Kitchen. LORazepam (ATIVAN) 1 MG tablet Take 1 tablet (1 mg total) by mouth 3 (three) times daily.  . meloxicam (MOBIC) 7.5 MG tablet Take 15 mg by mouth daily.   . memantine (NAMENDA) 10 MG tablet Take 10 mg by mouth 2 (two) times daily.  . Menthol, Topical Analgesic, (BIOFREEZE) 4 % GEL Apply 1 application topically 2 (two) times daily. For posterior neck pain and bilateral knees  . ondansetron (ZOFRAN) 4 MG tablet Take 4 mg by mouth every 6 (six) hours as needed for nausea or vomiting.  . pantoprazole (PROTONIX) 40 MG tablet Take 40 mg by mouth daily.  . polyethylene glycol (MIRALAX / GLYCOLAX) packet Take 17 g by mouth daily.  . sertraline (ZOLOFT) 50 MG tablet Take 50 mg by mouth daily.  . traMADol (ULTRAM) 50 MG tablet Take 1 tablet (50 mg total) by mouth 4 (four) times daily.  . ziprasidone (GEODON) 20 MG capsule Take 20 mg by mouth every evening.  . [DISCONTINUED] acetaminophen (TYLENOL) 500 MG tablet Take 500 mg by mouth every 8 (eight) hours as needed for moderate pain.   No facility-administered encounter medications on file as of 02/05/2018.      SIGNIFICANT DIAGNOSTIC EXAMS  NO NEW EXAMS  LABS REVIEWED:PREVIOUS   03-19-17: wbc 4.7; hgb 14.5; hct 40.0; mcv 88.8; plt 229; glucose 95; bun 21.6; creat 0.55; k+ 3.0; na++ 144; ca 9.7 liver normal albumin 4.5; tsh  2.69; hgb a1c 4.7; chol 121; ldl 56; trig 134; hdl 39  1-9-147-3-18: Vit D 33.37 06-19-17: glucose 122; bun 17.3; creat 0.71; k+ 4.0; na++ 141; ca 9.4; liver  normal 4.8 11-03-17: wbc 9.5; hgb 12.0; hct 34.6; mcv 90.7; plt 214; glucose 105; bun 16.1; creat 0.59 ;k+ 3.6; na++ 142; ca 8.9; liver normal albumin 4.2  NO NEW LABS      Review of Systems  Constitutional: Negative for malaise/fatigue.  Respiratory: Negative for cough and shortness of breath.   Cardiovascular: Negative for chest pain, palpitations and leg swelling.  Gastrointestinal: Negative for abdominal pain, constipation and heartburn.  Musculoskeletal: Positive for back pain, joint pain and myalgias.       Has back pain Muscle spasms Left hip pain   Skin: Negative.   Neurological: Negative for dizziness.  Psychiatric/Behavioral: The patient is not nervous/anxious.     Physical Exam  Constitutional: She is oriented to person, place, and time. She appears well-developed and well-nourished. No distress.  Neck: No thyromegaly present.  Cardiovascular: Normal rate, regular rhythm and intact distal pulses.  Murmur heard. 1/6  Pulmonary/Chest: Effort normal and breath sounds normal. No respiratory distress.  Abdominal: Soft. Bowel sounds are normal. She exhibits no distension. There is no tenderness.  Musculoskeletal: Normal range of motion. She exhibits no edema.  Does have tenderness to palpation to lower back  Is able to move all extremities  Lymphadenopathy:    She has no cervical adenopathy.  Neurological: She is alert and oriented to person, place, and time.  Skin: Skin is warm and dry. She is not diaphoretic.  Psychiatric: She has a normal mood and affect.      ASSESSMENT/ PLAN:  TODAY:   1. Osteoarthritis 2. Recurrent major depression with psychotic features:  3. Chronic low back pain:   Will use lidoderm patch 4% to lower back daily Will begin baclofen 5 mg three times daily as needed Will begin cymbalta 30 mg  daily for 7 days; then 60 mg daily for 7 days then 90 mg daily Will stop zoloft in 7 days     MD is aware of resident's narcotic use and is in agreement with current plan of care. We will attempt to wean resident as apropriate    Synthia Innocent NP [facility]  Contact [phone] Monday through Friday 8am- 5pm  After hours call [phone]

## 2018-02-05 NOTE — ED Provider Notes (Signed)
[facility]-EMERGENCY DEPT Provider Note: Lowella Dell, MD, FACEP  CSN: [id number] MRN: [id number] ARRIVAL: 02/05/18 at 0130 ROOM: WA24/WA24   CHIEF COMPLAINT  Back Pain  Level 5 caveat: Dementia HISTORY OF PRESENT ILLNESS  02/05/18 2:27 AM Jennifer Garner is a 72 y.o. female with chronic low back pain.  She is usually given Ativan and Ultram at bedtime.  Despite this she was complaining of uncontrolled pain in her back and was sent to the ED.  She denies new trauma.  She locates the pain in her left SI region radiating to her left buttock.  Pain is moderate and worse with movement of the left leg.  She has some mild left weakness due to an old stroke.   Past Medical History:  Diagnosis Date  . Allergy   . Anxiety   . Arthritis   . Asthma   . Cataract   . Dementia   . Depression   . Difficulty walking   . Dysphagia, oral phase   . Fibromyalgia   . GERD (gastroesophageal reflux disease)   . Hallucinations   . Hyperlipidemia   . Hypertension   . Lower extremity edema   . Parkinson disease (HCC)   . Repeated falls   . Rhabdomyolysis   . Stroke (HCC)   . TIA (transient ischemic attack)   . Urinary incontinence     Past Surgical History:  Procedure Laterality Date  . ABDOMINAL HYSTERECTOMY    . CATARACT EXTRACTION    . CHOLECYSTECTOMY    . TONSILLECTOMY AND ADENOIDECTOMY      Family History  Problem Relation Age of Onset  . Non-Hodgkin's lymphoma Mother   . Stroke Mother   . Heart attack Father     Social History   Tobacco Use  . Smoking status: Never Smoker  . Smokeless tobacco: Never Used  Substance Use Topics  . Alcohol use: No  . Drug use: No    Prior to Admission medications   Medication Sig Start Date End Date Taking? Authorizing Provider  acetaminophen (TYLENOL) 500 MG tablet Take 500 mg by mouth every 8 (eight) hours as needed for moderate pain.   Yes [provider]  amLODipine (NORVASC) 10 MG tablet Take 10 mg by mouth daily.   Yes  [provider]  aspirin EC 81 MG tablet Take 81 mg by mouth daily.   Yes [provider]  atorvastatin (LIPITOR) 10 MG tablet Take 10 mg by mouth daily.   Yes [provider]  busPIRone (BUSPAR) 5 MG tablet Take 5 mg by mouth 2 (two) times daily.   Yes [provider]  carbidopa-levodopa (SINEMET IR) 25-100 MG tablet Take 1 tablet by mouth 3 (three) times daily.    Yes [provider]  cholecalciferol (VITAMIN D) 1000 units tablet Take 1,000 Units by mouth daily.   Yes [provider]  diphenhydrAMINE (BENADRYL) 25 MG tablet Take 25 mg by mouth every 8 (eight) hours as needed for itching. 07/23/17  Yes [provider]  donepezil (ARICEPT) 10 MG tablet Take 10 mg by mouth at bedtime.   Yes [provider]  fluticasone (FLONASE) 50 MCG/ACT nasal spray Place 2 sprays into both nostrils daily.   Yes [provider]  loratadine (CLARITIN) 10 MG tablet Take 10 mg by mouth daily.   Yes [provider]  LORazepam (ATIVAN) 1 MG tablet Take 1 tablet (1 mg total) by mouth 3 (three) times daily. 01/26/18  Yes Sharee Holster, NP  meloxicam (MOBIC) 7.5 MG tablet Take 7.5 mg by mouth daily.   Yes [provider]  memantine (NAMENDA) 10 MG tablet Take 10 mg by mouth 2 (two) times daily.   Yes [provider]  Menthol, Topical Analgesic, (BIOFREEZE) 4 % GEL Apply 1 application topically 2 (two) times daily. for neck pain   Yes [provider]  pantoprazole (PROTONIX) 40 MG tablet Take 40 mg by mouth daily.   Yes [provider]  polyethylene glycol (MIRALAX / GLYCOLAX) packet Take 17 g by mouth daily.   Yes [provider]  sertraline (ZOLOFT) 50 MG tablet Take 50 mg by mouth daily.   Yes [provider]  traMADol (ULTRAM) 50 MG tablet Take 1 tablet (50 mg total) by mouth 4 (four) times daily. 01/20/18  Yes Sharee HolsterGreen, Deborah S, NP  ziprasidone (GEODON) 20 MG capsule Take 20 mg  by mouth every evening.   Yes [provider]  albuterol (PROVENTIL HFA;VENTOLIN HFA) 108 (90 Base) MCG/ACT inhaler Inhale 2 puffs into the lungs every 6 (six) hours as needed for wheezing or shortness of breath.    [provider]  aluminum-magnesium hydroxide-simethicone (MAALOX) 200-200-20 MG/5ML SUSP Take 30 mLs by mouth every 6 (six) hours as needed. Constipation    [provider]  calcium carbonate (TUMS - DOSED IN MG ELEMENTAL CALCIUM) 500 MG chewable tablet Chew 1 tablet by mouth every 6 (six) hours as needed for indigestion or heartburn.    [provider]  ondansetron (ZOFRAN) 4 MG tablet Take 4 mg by mouth every 6 (six) hours as needed for nausea or vomiting.    [provider]    Allergies Hydrocodone   REVIEW OF SYSTEMS     PHYSICAL EXAMINATION  Initial Vital Signs Blood pressure (!) 141/71, pulse 74, temperature 97.8 F (36.6 C), temperature source Oral, resp. rate 17, SpO2 97 %.  Examination General: Well-developed, well-nourished female in no acute distress; appearance consistent with age of record HENT: normocephalic; atraumatic Eyes: pupils equal, round and reactive to light; extraocular muscles intact Neck: supple Heart: regular rate and rhythm Lungs: clear to auscultation bilaterally Abdomen: soft; nondistended; nontender; bowel sounds present Back: No left paraspinal or SI tenderness; positive straight leg raise on the left Extremities: No deformity; full range of motion; pulses normal Neurologic: Awake, alert and oriented; motor function intact in all extremities with mild left-sided weakness; no facial droop Skin: Warm and dry Psychiatric: Flat affect   RESULTS  Summary of this visit's results, reviewed by myself:   EKG Interpretation  Date/Time:    Ventricular Rate:    PR Interval:    QRS Duration:   QT Interval:    QTC Calculation:   R Axis:     Text Interpretation:        Laboratory Studies: No  results found for this or any previous visit (from the past 24 hour(s)). Imaging Studies: Dg Hip Unilat W Or Wo Pelvis 2-3 Views Left  Result Date: 02/05/2018 CLINICAL DATA:  Left hip pain.  No known injury. EXAM: DG HIP (WITH OR WITHOUT PELVIS) 2-3V LEFT COMPARISON:  None. FINDINGS: The cortical margins of the bony pelvis and left hip are intact. No fracture. Pubic symphysis and sacroiliac joints are congruent. Minimal left acetabular spurring. No evidence of bony destructive change or focal lesion. Both femoral heads are well-seated in the respective acetabula. IMPRESSION: Mild osteoarthritis of the left hip. No acute or suspicious osseous abnormality. Electronically Signed   By: Rubye OaksMelanie  Ehinger  M.D.   On: 02/05/2018 03:27    ED COURSE  Nursing notes and initial vitals signs, including pulse oximetry, reviewed.  Vitals:   02/05/18 0140 02/05/18 0243  BP: (!) 141/71   Pulse: 74   Resp: 17   Temp: 97.8 F (36.6 C)   TempSrc: Oral   SpO2: 97% 96%   No evidence of any acute fracture on radiographs.   PROCEDURES    ED DIAGNOSES     ICD-10-CM   1. Chronic left-sided low back pain without sciatica M54.5    G89.29   2. Primary osteoarthritis of left hip M16.12        Korey Arroyo, Jonny Ruiz, MD 02/05/18 [phone]

## 2018-02-05 NOTE — ED Notes (Signed)
Back pain

## 2018-02-05 NOTE — ED Triage Notes (Signed)
Pt is from SNF, has chronic back pain, tonight she got her night meds and was still in a lot of pain Pt also complains of nausea

## 2018-02-08 DIAGNOSIS — R262 Difficulty in walking, not elsewhere classified: Secondary | ICD-10-CM | POA: Diagnosis not present

## 2018-02-08 DIAGNOSIS — M6281 Muscle weakness (generalized): Secondary | ICD-10-CM | POA: Diagnosis not present

## 2018-02-09 DIAGNOSIS — F5105 Insomnia due to other mental disorder: Secondary | ICD-10-CM | POA: Diagnosis not present

## 2018-02-09 DIAGNOSIS — R262 Difficulty in walking, not elsewhere classified: Secondary | ICD-10-CM | POA: Diagnosis not present

## 2018-02-09 DIAGNOSIS — G3183 Dementia with Lewy bodies: Secondary | ICD-10-CM | POA: Diagnosis not present

## 2018-02-09 DIAGNOSIS — F064 Anxiety disorder due to known physiological condition: Secondary | ICD-10-CM | POA: Diagnosis not present

## 2018-02-09 DIAGNOSIS — F331 Major depressive disorder, recurrent, moderate: Secondary | ICD-10-CM | POA: Diagnosis not present

## 2018-02-09 DIAGNOSIS — M6281 Muscle weakness (generalized): Secondary | ICD-10-CM | POA: Diagnosis not present

## 2018-02-09 DIAGNOSIS — F0281 Dementia in other diseases classified elsewhere with behavioral disturbance: Secondary | ICD-10-CM | POA: Diagnosis not present

## 2018-02-11 DIAGNOSIS — M6281 Muscle weakness (generalized): Secondary | ICD-10-CM | POA: Diagnosis not present

## 2018-02-11 DIAGNOSIS — R262 Difficulty in walking, not elsewhere classified: Secondary | ICD-10-CM | POA: Diagnosis not present

## 2018-02-15 ENCOUNTER — Encounter: Payer: Self-pay | Admitting: Adult Health

## 2018-02-15 ENCOUNTER — Non-Acute Institutional Stay (SKILLED_NURSING_FACILITY): Payer: Medicare Other | Admitting: Adult Health

## 2018-02-15 DIAGNOSIS — M545 Low back pain: Secondary | ICD-10-CM | POA: Diagnosis not present

## 2018-02-15 DIAGNOSIS — M159 Polyosteoarthritis, unspecified: Secondary | ICD-10-CM

## 2018-02-15 DIAGNOSIS — K5901 Slow transit constipation: Secondary | ICD-10-CM | POA: Diagnosis not present

## 2018-02-15 DIAGNOSIS — F330 Major depressive disorder, recurrent, mild: Secondary | ICD-10-CM

## 2018-02-15 DIAGNOSIS — M150 Primary generalized (osteo)arthritis: Secondary | ICD-10-CM

## 2018-02-15 DIAGNOSIS — G8929 Other chronic pain: Secondary | ICD-10-CM

## 2018-02-15 DIAGNOSIS — R262 Difficulty in walking, not elsewhere classified: Secondary | ICD-10-CM | POA: Diagnosis not present

## 2018-02-15 DIAGNOSIS — M6281 Muscle weakness (generalized): Secondary | ICD-10-CM | POA: Diagnosis not present

## 2018-02-15 NOTE — Progress Notes (Signed)
[facility] Room Number: 111 B Place of Service:  SNF (31)   CODE STATUS: Full Code (Most form updated 03/17/17)  Allergies  Allergen Reactions  . Hydrocodone Other (See Comments)    GI upset    Chief Complaint  Patient presents with  . Acute Visit    Diarrhea / Patient concerns    HPI:  She has several concerns:  1. Diarrhea: she tells me that she has had diarrheal stools for the past week. She has not tried any thing to slow down her stools. She denies any abdominal pain; no cramping. She has had some mild nausea; but is able to eat grilled cheese. She is taking miralax daily   2. Anxiety: she feels as though the ativan is not working. We did discuss her medications and that she has recently started cymbalta. She will need to give this medication more time to get to her effective dose. Se did verbalize understanding. She would like for the times of her ativan to be adjusted.   3. She has chronic back pain; for which she is not getting adequate relief. She is taking mobic 15 mg daily; lidoderm 4% patch to her back; ultram 50 mg four times daily and has baclofen 5 mg every 8 hours as needed for spasms. She complains of burning pain and spasms to her lower back and bilateral hips. We did discuss her treatment options and she is willing to go to a pain clinic.    Past Medical History:  Diagnosis Date  . Allergy   . Anxiety   . Arthritis   . Asthma   . Cataract   . Dementia   . Depression   . Difficulty walking   . Dysphagia, oral phase   . Fibromyalgia   . GERD (gastroesophageal reflux disease)   . Hallucinations   . Hyperlipidemia   . Hypertension   . Lower extremity edema   . Parkinson disease (HCC)   . Repeated falls   . Rhabdomyolysis   . Stroke (HCC)   . TIA (transient ischemic attack)   . Urinary incontinence     Past Surgical History:  Procedure Laterality Date  . ABDOMINAL HYSTERECTOMY    . CATARACT EXTRACTION    .  CHOLECYSTECTOMY    . TONSILLECTOMY AND ADENOIDECTOMY      Social History   Socioeconomic History  . Marital status: Divorced    Spouse name: Not on file  . Number of children: 4  . Years of education: 6312  . Highest education level: High school graduate  Occupational History  . Not on file  Social Needs  . Financial resource strain: Not on file  . Food insecurity:    Worry: Not on file    Inability: Not on file  . Transportation needs:    Medical: Not on file    Non-medical: Not on file  Tobacco Use  . Smoking status: Never Smoker  . Smokeless tobacco: Never Used  Substance and Sexual Activity  . Alcohol use: No  . Drug use: No  . Sexual activity: Never  Lifestyle  . Physical activity:    Days per week: Not on file    Minutes per session: Not on file  . Stress: Not on file  Relationships  . Social connections:    Talks on phone: Not on file    Gets together: Not on file    Attends religious service: Not on file    Active  member of club or organization: Not on file    Attends meetings of clubs or organizations: Not on file    Relationship status: Not on file  . Intimate partner violence:    Fear of current or ex partner: Not on file    Emotionally abused: Not on file    Physically abused: Not on file    Forced sexual activity: Not on file  Other Topics Concern  . Not on file  Social History Narrative   [facility] at [facility]   Right-handed.   2 cups caffeine per day.   Family History  Problem Relation Age of Onset  . Non-Hodgkin's lymphoma Mother   . Stroke Mother   . Heart attack Father       VITAL SIGNS BP 124/80   Pulse 70   Temp 98 F (36.7 C)   Resp 18   Ht 5\' 5"  (1.651 m)   Wt 175 lb (79.4 kg)   SpO2 96%   BMI 29.12 kg/m   Outpatient Encounter Medications as of 02/15/2018  Medication Sig  . albuterol (PROVENTIL HFA;VENTOLIN HFA) 108 (90 Base) MCG/ACT inhaler Inhale 2 puffs into the lungs every 6 (six) hours as needed for wheezing or  shortness of breath.  Marland Kitchen aluminum-magnesium hydroxide-simethicone (MAALOX) 200-200-20 MG/5ML SUSP Take 30 mLs by mouth every 6 (six) hours as needed. Constipation  . amLODipine (NORVASC) 10 MG tablet Take 10 mg by mouth daily.  Marland Kitchen aspirin EC 81 MG tablet Take 81 mg by mouth daily.  Marland Kitchen atorvastatin (LIPITOR) 10 MG tablet Take 10 mg by mouth daily.  . Baclofen 5 MG TABS Take 1 tablet by mouth every 8 (eight) hours as needed.  . busPIRone (BUSPAR) 5 MG tablet Take 5 mg by mouth 2 (two) times daily.  . calcium carbonate (TUMS - DOSED IN MG ELEMENTAL CALCIUM) 500 MG chewable tablet Chew 1 tablet by mouth every 6 (six) hours as needed for indigestion or heartburn.  . carbidopa-levodopa (SINEMET IR) 25-100 MG tablet Take 1 tablet by mouth 3 (three) times daily.   . cholecalciferol (VITAMIN D) 1000 units tablet Take 1,000 Units by mouth daily.  . diphenhydrAMINE (BENADRYL) 25 MG tablet Take 25 mg by mouth every 8 (eight) hours as needed for itching.  . donepezil (ARICEPT) 10 MG tablet Take 10 mg by mouth at bedtime.  . DULoxetine (CYMBALTA) 30 MG capsule Give 2 tablets (60mg ) by mouth daily for 14 days then give 90mg  by mouth daily starting 03/01/18  . fluticasone (FLONASE) 50 MCG/ACT nasal spray Place 2 sprays into both nostrils daily.  . Lidocaine 4 % PTCH Apply to lower back topically one time a day every AM and remove every PM for pain management  . loratadine (CLARITIN) 10 MG tablet Take 10 mg by mouth daily.  Marland Kitchen LORazepam (ATIVAN) 1 MG tablet Take 1 tablet (1 mg total) by mouth 3 (three) times daily.  . meloxicam (MOBIC) 7.5 MG tablet Take 15 mg by mouth daily.   . memantine (NAMENDA) 10 MG tablet Take 10 mg by mouth 2 (two) times daily.  . Menthol, Topical Analgesic, (BIOFREEZE) 4 % GEL Apply 1 application topically 2 (two) times daily. For posterior neck pain and bilateral knees  . ondansetron (ZOFRAN) 4 MG tablet Take 4 mg by mouth every 6 (six) hours as needed for nausea or vomiting.  .  pantoprazole (PROTONIX) 40 MG tablet Take 40 mg by mouth daily.  . polyethylene glycol (MIRALAX / GLYCOLAX) packet Take 17 g by  mouth daily.  . traMADol (ULTRAM) 50 MG tablet Take 1 tablet (50 mg total) by mouth 4 (four) times daily.  . ziprasidone (GEODON) 20 MG capsule Take 20 mg by mouth every evening.  . [DISCONTINUED] sertraline (ZOLOFT) 50 MG tablet Take 50 mg by mouth daily.   No facility-administered encounter medications on file as of 02/15/2018.      SIGNIFICANT DIAGNOSTIC EXAMS  NO NEW EXAMS  LABS REVIEWED:PREVIOUS   03-19-17: wbc 4.7; hgb 14.5; hct 40.0; mcv 88.8; plt 229; glucose 95; bun 21.6; creat 0.55; k+ 3.0; na++ 144; ca 9.7 liver normal albumin 4.5; tsh 2.69; hgb a1c 4.7; chol 121; ldl 56; trig 134; hdl 39  4-0-98: Vit D 33.37 06-19-17: glucose 122; bun 17.3; creat 0.71; k+ 4.0; na++ 141; ca 9.4; liver normal 4.8 11-03-17: wbc 9.5; hgb 12.0; hct 34.6; mcv 90.7; plt 214; glucose 105; bun 16.1; creat 0.59 ;k+ 3.6; na++ 142; ca 8.9; liver normal albumin 4.2  NO NEW LABS     Review of Systems  Constitutional: Negative for malaise/fatigue.  Respiratory: Negative for cough and shortness of breath.   Cardiovascular: Negative for chest pain, palpitations and leg swelling.  Gastrointestinal: Positive for diarrhea and nausea. Negative for abdominal pain, constipation, heartburn and vomiting.  Musculoskeletal: Positive for back pain, joint pain and myalgias.       Has back pain; bilateral hip pain; spasms and burning pain   Skin: Negative.   Neurological: Negative for dizziness.  Psychiatric/Behavioral: The patient is nervous/anxious.      Physical Exam  Constitutional: She is oriented to person, place, and time. She appears well-developed and well-nourished. No distress.  Neck: No thyromegaly present.  Cardiovascular: Normal rate, regular rhythm and intact distal pulses.  Murmur heard. 1/6  Pulmonary/Chest: Effort normal and breath sounds normal. No respiratory distress.    Abdominal: Soft. Bowel sounds are normal. She exhibits no distension. There is no tenderness.  Musculoskeletal: She exhibits no edema.  Is able to move all extremities  Has tenderness to palpation to lower back and bilateral hips   Lymphadenopathy:    She has no cervical adenopathy.  Neurological: She is alert and oriented to person, place, and time.  Skin: Skin is warm and dry. She is not diaphoretic.  Psychiatric: She has a normal mood and affect.      ASSESSMENT/ PLAN:  TODAY:   1. Chronic bilateral low back pain without sciatica  2. Primary osteoarthritis involving multiple joints 3. Current major depression 4. Slow transit constipation  1. Will change miralax to daily as needed 2. Will begin imodium 2 mg every 6 hours as needed 3. Will begin neurontin 100 mg nightly for her burning back pain 4. Will setup pain clinic consult     MD is aware of resident's narcotic use and is in agreement with current plan of care. We will attempt to wean resident as apropriate    Synthia Innocent NP [facility]  Contact [phone] Monday through Friday 8am- 5pm  After hours call [phone]

## 2018-02-16 DIAGNOSIS — M6281 Muscle weakness (generalized): Secondary | ICD-10-CM | POA: Diagnosis not present

## 2018-02-16 DIAGNOSIS — R262 Difficulty in walking, not elsewhere classified: Secondary | ICD-10-CM | POA: Diagnosis not present

## 2018-02-17 DIAGNOSIS — M6281 Muscle weakness (generalized): Secondary | ICD-10-CM | POA: Diagnosis not present

## 2018-02-17 DIAGNOSIS — R262 Difficulty in walking, not elsewhere classified: Secondary | ICD-10-CM | POA: Diagnosis not present

## 2018-02-18 DIAGNOSIS — F064 Anxiety disorder due to known physiological condition: Secondary | ICD-10-CM | POA: Diagnosis not present

## 2018-02-18 DIAGNOSIS — F5105 Insomnia due to other mental disorder: Secondary | ICD-10-CM | POA: Diagnosis not present

## 2018-02-18 DIAGNOSIS — G3183 Dementia with Lewy bodies: Secondary | ICD-10-CM | POA: Diagnosis not present

## 2018-02-18 DIAGNOSIS — R262 Difficulty in walking, not elsewhere classified: Secondary | ICD-10-CM | POA: Diagnosis not present

## 2018-02-18 DIAGNOSIS — F331 Major depressive disorder, recurrent, moderate: Secondary | ICD-10-CM | POA: Diagnosis not present

## 2018-02-18 DIAGNOSIS — M6281 Muscle weakness (generalized): Secondary | ICD-10-CM | POA: Diagnosis not present

## 2018-02-18 DIAGNOSIS — F0281 Dementia in other diseases classified elsewhere with behavioral disturbance: Secondary | ICD-10-CM | POA: Diagnosis not present

## 2018-02-19 DIAGNOSIS — R262 Difficulty in walking, not elsewhere classified: Secondary | ICD-10-CM | POA: Diagnosis not present

## 2018-02-19 DIAGNOSIS — M6281 Muscle weakness (generalized): Secondary | ICD-10-CM | POA: Diagnosis not present

## 2018-02-23 DIAGNOSIS — G20 Parkinson's disease: Secondary | ICD-10-CM | POA: Diagnosis not present

## 2018-02-23 DIAGNOSIS — M545 Low back pain: Secondary | ICD-10-CM | POA: Diagnosis not present

## 2018-02-23 DIAGNOSIS — R2689 Other abnormalities of gait and mobility: Secondary | ICD-10-CM | POA: Diagnosis not present

## 2018-03-10 ENCOUNTER — Non-Acute Institutional Stay (SKILLED_NURSING_FACILITY): Payer: Medicare Other | Admitting: Adult Health

## 2018-03-10 ENCOUNTER — Encounter: Payer: Self-pay | Admitting: Adult Health

## 2018-03-10 DIAGNOSIS — G8929 Other chronic pain: Secondary | ICD-10-CM | POA: Diagnosis not present

## 2018-03-10 DIAGNOSIS — M150 Primary generalized (osteo)arthritis: Secondary | ICD-10-CM | POA: Diagnosis not present

## 2018-03-10 DIAGNOSIS — M159 Polyosteoarthritis, unspecified: Secondary | ICD-10-CM

## 2018-03-10 DIAGNOSIS — M545 Low back pain, unspecified: Secondary | ICD-10-CM

## 2018-03-10 DIAGNOSIS — F330 Major depressive disorder, recurrent, mild: Secondary | ICD-10-CM | POA: Diagnosis not present

## 2018-03-10 NOTE — Progress Notes (Signed)
Location:   [facility] Room Number: 111 B Place of Service:  SNF (31)   CODE STATUS: Full Code (Most form updated 03/17/17)  Allergies  Allergen Reactions  . Hydrocodone Other (See Comments)    GI upset    Chief Complaint  Patient presents with  . Acute Visit    care plan meeting       HPI:  We have come together for her care plan meeting. She does not have family present; but is able participate with the meeting. She is worried about weight gain which does not exist; and she is worried about lower extremity edema which does not exist. She continues to have chronic back pain; she is awaiting a pain clinic appointment. She denies any insomnia; no changes in appetite.  We have discussed her advanced directives she does not want to make changes at this time.    Past Medical History:  Diagnosis Date  . Allergy   . Anxiety   . Arthritis   . Asthma   . Cataract   . Dementia   . Depression   . Difficulty walking   . Dysphagia, oral phase   . Fibromyalgia   . GERD (gastroesophageal reflux disease)   . Hallucinations   . Hyperlipidemia   . Hypertension   . Lower extremity edema   . Parkinson disease (HCC)   . Repeated falls   . Rhabdomyolysis   . Stroke (HCC)   . TIA (transient ischemic attack)   . Urinary incontinence     Past Surgical History:  Procedure Laterality Date  . ABDOMINAL HYSTERECTOMY    . CATARACT EXTRACTION    . CHOLECYSTECTOMY    . TONSILLECTOMY AND ADENOIDECTOMY      Social History   Socioeconomic History  . Marital status: Divorced    Spouse name: Not on file  . Number of children: 4  . Years of education: 76  . Highest education level: High school graduate  Occupational History  . Not on file  Social Needs  . Financial resource strain: Not on file  . Food insecurity:    Worry: Not on file    Inability: Not on file  . Transportation needs:    Medical: Not on file    Non-medical: Not on file  Tobacco Use  . Smoking  status: Never Smoker  . Smokeless tobacco: Never Used  Substance and Sexual Activity  . Alcohol use: No  . Drug use: No  . Sexual activity: Never  Lifestyle  . Physical activity:    Days per week: Not on file    Minutes per session: Not on file  . Stress: Not on file  Relationships  . Social connections:    Talks on phone: Not on file    Gets together: Not on file    Attends religious service: Not on file    Active member of club or organization: Not on file    Attends meetings of clubs or organizations: Not on file    Relationship status: Not on file  . Intimate partner violence:    Fear of current or ex partner: Not on file    Emotionally abused: Not on file    Physically abused: Not on file    Forced sexual activity: Not on file  Other Topics Concern  . Not on file  Social History Narrative   Lives at [facility]   Right-handed.   2 cups caffeine per day.   Family History  Problem  Relation Age of Onset  . Non-Hodgkin's lymphoma Mother   . Stroke Mother   . Heart attack Father       VITAL SIGNS BP 120/60   Pulse 72   Temp 97.6 F (36.4 C)   Resp 18   Ht 5\' 5"  (1.651 m)   Wt 173 lb 4.8 oz (78.6 kg)   SpO2 98%   BMI 28.84 kg/m   Outpatient Encounter Medications as of 03/10/2018  Medication Sig  . albuterol (PROVENTIL HFA;VENTOLIN HFA) 108 (90 Base) MCG/ACT inhaler Inhale 2 puffs into the lungs every 6 (six) hours as needed for wheezing or shortness of breath.  Marland Kitchen. aluminum-magnesium hydroxide-simethicone (MAALOX) 200-200-20 MG/5ML SUSP Take 30 mLs by mouth every 6 (six) hours as needed. Constipation  . amLODipine (NORVASC) 10 MG tablet Take 10 mg by mouth daily.  Marland Kitchen. aspirin EC 81 MG tablet Take 81 mg by mouth daily.  Marland Kitchen. atorvastatin (LIPITOR) 10 MG tablet Take 10 mg by mouth daily.  . Baclofen 5 MG TABS Take 1 tablet by mouth every 8 (eight) hours as needed.  . busPIRone (BUSPAR) 5 MG tablet Take 5 mg by mouth 2 (two) times daily.  . calcium carbonate (TUMS  - DOSED IN MG ELEMENTAL CALCIUM) 500 MG chewable tablet Chew 1 tablet by mouth every 6 (six) hours as needed for indigestion or heartburn.  . carbidopa-levodopa (SINEMET IR) 25-100 MG tablet Take 1 tablet by mouth 3 (three) times daily.   . cholecalciferol (VITAMIN D) 1000 units tablet Take 1,000 Units by mouth daily.  . diphenhydrAMINE (BENADRYL) 25 MG tablet Take 25 mg by mouth every 8 (eight) hours as needed for itching.  . donepezil (ARICEPT) 10 MG tablet Take 10 mg by mouth at bedtime.  . DULoxetine (CYMBALTA) 30 MG capsule Give 3 tablets (90mg ) by mouth daily  . fluticasone (FLONASE) 50 MCG/ACT nasal spray Place 2 sprays into both nostrils daily.  Marland Kitchen. gabapentin (NEURONTIN) 100 MG capsule Take 100 mg by mouth at bedtime.  . Lidocaine 4 % PTCH Apply to lower back topically one time a day every AM and remove every PM for pain management  . loperamide (IMODIUM) 2 MG capsule Take 2 mg by mouth every 6 (six) hours as needed for diarrhea or loose stools.  Marland Kitchen. loratadine (CLARITIN) 10 MG tablet Take 10 mg by mouth daily as needed.   Marland Kitchen. LORazepam (ATIVAN) 1 MG tablet Take 1 tablet (1 mg total) by mouth 3 (three) times daily.  . meloxicam (MOBIC) 7.5 MG tablet Take 15 mg by mouth daily.   . memantine (NAMENDA) 10 MG tablet Take 10 mg by mouth 2 (two) times daily.  . Menthol, Topical Analgesic, (BIOFREEZE) 4 % GEL Apply 1 application topically 2 (two) times daily. For posterior neck pain and bilateral knees  . ondansetron (ZOFRAN) 4 MG tablet Take 4 mg by mouth every 6 (six) hours as needed for nausea or vomiting.  . pantoprazole (PROTONIX) 40 MG tablet Take 40 mg by mouth daily.  . polyethylene glycol (MIRALAX / GLYCOLAX) packet Take 17 g by mouth daily.  . traMADol (ULTRAM) 50 MG tablet Take 1 tablet (50 mg total) by mouth 4 (four) times daily.  . ziprasidone (GEODON) 20 MG capsule Take 20 mg by mouth every evening.   No facility-administered encounter medications on file as of 03/10/2018.       SIGNIFICANT DIAGNOSTIC EXAMS  NO NEW EXAMS  LABS REVIEWED:PREVIOUS   03-19-17: wbc 4.7; hgb 14.5; hct 40.0; mcv 88.8; plt 229;  glucose 95; bun 21.6; creat 0.55; k+ 3.0; na++ 144; ca 9.7 liver normal albumin 4.5; tsh 2.69; hgb a1c 4.7; chol 121; ldl 56; trig 134; hdl 39  11-22-08: Vit D 33.37 06-19-17: glucose 122; bun 17.3; creat 0.71; k+ 4.0; na++ 141; ca 9.4; liver normal 4.8 11-03-17: wbc 9.5; hgb 12.0; hct 34.6; mcv 90.7; plt 214; glucose 105; bun 16.1; creat 0.59 ;k+ 3.6; na++ 142; ca 8.9; liver normal albumin 4.2  NO NEW LABS     Review of Systems  Constitutional: Negative for malaise/fatigue.  Respiratory: Negative for cough and shortness of breath.   Cardiovascular: Negative for chest pain, palpitations and leg swelling.  Gastrointestinal: Negative for abdominal pain, constipation and heartburn.  Musculoskeletal: Positive for back pain. Negative for joint pain and myalgias.  Skin: Negative.   Neurological: Negative for dizziness.  Psychiatric/Behavioral: The patient is not nervous/anxious.     Physical Exam  Constitutional: She is oriented to person, place, and time. She appears well-developed and well-nourished. No distress.  Neck: No thyromegaly present.  Cardiovascular: Normal rate, regular rhythm and intact distal pulses.  Murmur heard. 1/6  Pulmonary/Chest: Effort normal and breath sounds normal. No respiratory distress.  Abdominal: Soft. Bowel sounds are normal. She exhibits no distension. There is no tenderness.  Musculoskeletal: Normal range of motion. She exhibits no edema.  Lymphadenopathy:    She has no cervical adenopathy.  Neurological: She is alert and oriented to person, place, and time.  Skin: Skin is warm and dry. She is not diaphoretic.  Psychiatric: She has a normal mood and affect.     ASSESSMENT/ PLAN:  TODAY:   1. Chronic bilateral low back pain without sciatica  2. Primary osteoarthritis involving multiple joints 3. Current major  depression  Will continue her current plan of care Will continue her current medications Will check cbc; cmp lipids; hgb a1c   Time spent with patient 30 minutes: discussed current medical status; medications and goal of care. Verbalized understanding.    MD is aware of resident's narcotic use and is in agreement with current plan of care. We will attempt to wean resident as apropriate   Synthia Innocent NP [facility]  Contact [phone] Monday through Friday 8am- 5pm  After hours call [phone]

## 2018-03-15 DIAGNOSIS — Z5181 Encounter for therapeutic drug level monitoring: Secondary | ICD-10-CM | POA: Diagnosis not present

## 2018-03-15 DIAGNOSIS — F5105 Insomnia due to other mental disorder: Secondary | ICD-10-CM | POA: Diagnosis not present

## 2018-03-15 DIAGNOSIS — E119 Type 2 diabetes mellitus without complications: Secondary | ICD-10-CM | POA: Diagnosis not present

## 2018-03-15 DIAGNOSIS — F331 Major depressive disorder, recurrent, moderate: Secondary | ICD-10-CM | POA: Diagnosis not present

## 2018-03-15 DIAGNOSIS — E785 Hyperlipidemia, unspecified: Secondary | ICD-10-CM | POA: Diagnosis not present

## 2018-03-15 DIAGNOSIS — F064 Anxiety disorder due to known physiological condition: Secondary | ICD-10-CM | POA: Diagnosis not present

## 2018-03-15 DIAGNOSIS — F0281 Dementia in other diseases classified elsewhere with behavioral disturbance: Secondary | ICD-10-CM | POA: Diagnosis not present

## 2018-03-15 DIAGNOSIS — D649 Anemia, unspecified: Secondary | ICD-10-CM | POA: Diagnosis not present

## 2018-03-15 DIAGNOSIS — G3183 Dementia with Lewy bodies: Secondary | ICD-10-CM | POA: Diagnosis not present

## 2018-03-15 LAB — HEPATIC FUNCTION PANEL
ALT: 6 — AB (ref 7–35)
AST: 11 — AB (ref 13–35)
Alkaline Phosphatase: 116 (ref 25–125)
Bilirubin, Total: 0.6

## 2018-03-15 LAB — LIPID PANEL
CHOLESTEROL: 139 (ref 0–200)
HDL: 46 (ref 35–70)
LDL CALC: 74
TRIGLYCERIDES: 97 (ref 40–160)

## 2018-03-15 LAB — BASIC METABOLIC PANEL
BUN: 13 (ref 4–21)
Creatinine: 0.7 (ref 0.5–1.1)
GLUCOSE: 97
POTASSIUM: 3.4 (ref 3.4–5.3)
SODIUM: 144 (ref 137–147)

## 2018-03-15 LAB — HEMOGLOBIN A1C: Hemoglobin A1C: 5.4

## 2018-03-15 LAB — CBC AND DIFFERENTIAL
HCT: 37 (ref 36–46)
HEMOGLOBIN: 12.9 (ref 12.0–16.0)
NEUTROS ABS: 4
Platelets: 270 (ref 150–399)
WBC: 6.4

## 2018-03-16 ENCOUNTER — Encounter: Payer: Self-pay | Admitting: Adult Health

## 2018-03-16 ENCOUNTER — Non-Acute Institutional Stay (SKILLED_NURSING_FACILITY): Payer: Medicare Other | Admitting: Adult Health

## 2018-03-16 DIAGNOSIS — M150 Primary generalized (osteo)arthritis: Secondary | ICD-10-CM | POA: Diagnosis not present

## 2018-03-16 DIAGNOSIS — I10 Essential (primary) hypertension: Secondary | ICD-10-CM

## 2018-03-16 DIAGNOSIS — K5901 Slow transit constipation: Secondary | ICD-10-CM | POA: Diagnosis not present

## 2018-03-16 DIAGNOSIS — F330 Major depressive disorder, recurrent, mild: Secondary | ICD-10-CM | POA: Diagnosis not present

## 2018-03-16 DIAGNOSIS — M159 Polyosteoarthritis, unspecified: Secondary | ICD-10-CM

## 2018-03-16 NOTE — Progress Notes (Signed)
Location:   [facility] Room Number: 111 B Place of Service:  SNF (31)   CODE STATUS: Full Code (Most form updated 03/17/17)  Allergies  Allergen Reactions  . Hydrocodone Other (See Comments)    GI upset    Chief Complaint  Patient presents with  . Medical Management of Chronic Issues    Hypertension; constipation; osteoarthritis; depression.     HPI:  She is a 72 year old long term resident of this facility being seen for the management of her chronic illnesses: hypertension; constipation; osteoarthritis; depression. She denies any anxiety; no change in appetite. She does have chronic back pain and is awaiting pain clinic consult. There are no nursing concerns at this time.   Past Medical History:  Diagnosis Date  . Allergy   . Anxiety   . Arthritis   . Asthma   . Cataract   . Dementia   . Depression   . Difficulty walking   . Dysphagia, oral phase   . Fibromyalgia   . GERD (gastroesophageal reflux disease)   . Hallucinations   . Hyperlipidemia   . Hypertension   . Lower extremity edema   . Parkinson disease (HCC)   . Repeated falls   . Rhabdomyolysis   . Stroke (HCC)   . TIA (transient ischemic attack)   . Urinary incontinence     Past Surgical History:  Procedure Laterality Date  . ABDOMINAL HYSTERECTOMY    . CATARACT EXTRACTION    . CHOLECYSTECTOMY    . TONSILLECTOMY AND ADENOIDECTOMY      Social History   Socioeconomic History  . Marital status: Divorced    Spouse name: Not on file  . Number of children: 4  . Years of education: 33  . Highest education level: High school graduate  Occupational History  . Not on file  Social Needs  . Financial resource strain: Not on file  . Food insecurity:    Worry: Not on file    Inability: Not on file  . Transportation needs:    Medical: Not on file    Non-medical: Not on file  Tobacco Use  . Smoking status: Never Smoker  . Smokeless tobacco: Never Used  Substance and Sexual Activity   . Alcohol use: No  . Drug use: No  . Sexual activity: Never  Lifestyle  . Physical activity:    Days per week: Not on file    Minutes per session: Not on file  . Stress: Not on file  Relationships  . Social connections:    Talks on phone: Not on file    Gets together: Not on file    Attends religious service: Not on file    Active member of club or organization: Not on file    Attends meetings of clubs or organizations: Not on file    Relationship status: Not on file  . Intimate partner violence:    Fear of current or ex partner: Not on file    Emotionally abused: Not on file    Physically abused: Not on file    Forced sexual activity: Not on file  Other Topics Concern  . Not on file  Social History Narrative   Lives at [facility]   Right-handed.   2 cups caffeine per day.   Family History  Problem Relation Age of Onset  . Non-Hodgkin's lymphoma Mother   . Stroke Mother   . Heart attack Father       VITAL SIGNS BP  120/78   Pulse 71   Temp 97.6 F (36.4 C)   Resp 18   Ht  (1.651 m)   Wt 173 lb 4.8 oz (78.6 kg)   SpO2 98%   BMI 28.84 kg/m   Outpatient Encounter Medications as of 03/16/2018  Medication Sig  . albuterol (PROVENTIL HFA;VENTOLIN HFA) 108 (90 Base) MCG/ACT inhaler Inhale 2 puffs into the lungs every 6 (six) hours as needed for wheezing or shortness of breath.  Marland Kitchen aluminum-magnesium hydroxide-simethicone (MAALOX) 200-200-20 MG/5ML SUSP Take 30 mLs by mouth every 6 (six) hours as needed. Constipation  . amLODipine (NORVASC) 10 MG tablet Take 10 mg by mouth daily.  Marland Kitchen aspirin EC 81 MG tablet Take 81 mg by mouth daily.  Marland Kitchen atorvastatin (LIPITOR) 10 MG tablet Take 10 mg by mouth daily.  . Baclofen 5 MG TABS Take 1 tablet by mouth every 8 (eight) hours as needed.  . busPIRone (BUSPAR) 5 MG tablet Take 5 mg by mouth 2 (two) times daily.  . calcium carbonate (TUMS - DOSED IN MG ELEMENTAL CALCIUM) 500 MG chewable tablet Chew 1 tablet by mouth every 6  (six) hours as needed for indigestion or heartburn.  . carbidopa-levodopa (SINEMET IR) 25-100 MG tablet Take 1 tablet by mouth 3 (three) times daily.   . cholecalciferol (VITAMIN D) 1000 units tablet Take 1,000 Units by mouth daily.  . diphenhydrAMINE (BENADRYL) 25 MG tablet Take 25 mg by mouth every 8 (eight) hours as needed for itching.  . donepezil (ARICEPT) 10 MG tablet Take 10 mg by mouth at bedtime.  . DULoxetine (CYMBALTA) 30 MG capsule Give 3 tablets ( ) by mouth daily  . fluticasone (FLONASE) 50 MCG/ACT nasal spray Place 2 sprays into both nostrils daily.  Marland Kitchen gabapentin (NEURONTIN) 100 MG capsule Take 100 mg by mouth at bedtime.  . Lidocaine 4 % PTCH Apply to lower back topically one time a day every AM and remove every PM for pain management  . loperamide (IMODIUM) 2 MG capsule Take 2 mg by mouth every 6 (six) hours as needed for diarrhea or loose stools.  Marland Kitchen loratadine (CLARITIN) 10 MG tablet Take 10 mg by mouth daily as needed.   Marland Kitchen LORazepam (ATIVAN) 1 MG tablet Take 1 tablet (1 mg total) by mouth 3 (three) times daily.  . meloxicam (MOBIC) 7.5 MG tablet Take 15 mg by mouth daily.   . memantine (NAMENDA) 10 MG tablet Take 10 mg by mouth 2 (two) times daily.  . Menthol, Topical Analgesic, (BIOFREEZE) 4 % GEL Apply 1 application topically 2 (two) times daily. For posterior neck pain and bilateral knees  . ondansetron (ZOFRAN) 4 MG tablet Take 4 mg by mouth every 6 (six) hours as needed for nausea or vomiting.  . pantoprazole (PROTONIX) 40 MG tablet Take 40 mg by mouth daily.  . polyethylene glycol (MIRALAX / GLYCOLAX) packet Take 17 g by mouth daily.  . traMADol (ULTRAM) 50 MG tablet Take 1 tablet (50 mg total) by mouth 4 (four) times daily.  . ziprasidone (GEODON) 20 MG capsule Take 20 mg by mouth every evening.   No facility-administered encounter medications on file as of 03/16/2018.      SIGNIFICANT DIAGNOSTIC EXAMS   NO NEW EXAMS  LABS REVIEWED:PREVIOUS   03-19-17: wbc  4.7; hgb 14.5; hct 40.0; mcv 88.8; plt 229; glucose 95; bun 21.6; creat 0.55; k+ 3.0; na++ 144; ca 9.7 liver normal albumin 4.5; tsh 2.69; hgb a1c 4.7; chol 121; ldl 56; trig 134; hdl  39  05-19-17: Vit D 33.37 06-19-17: glucose 122; bun 17.3; creat 0.71; k+ 4.0; na++ 141; ca 9.4; liver normal 4.8 11-03-17: wbc 9.5; hgb 12.0; hct 34.6; mcv 90.7; plt 214; glucose 105; bun 16.1; creat 0.59 ;k+ 3.6; na++ 142; ca 8.9; liver normal albumin 4.2  TODAY:   03-15-18: wbc 6.4; hgb 12.9; hct 37.3; mcv 88.;5 plt 270; glucose 97; bun 12.6; creat 0.71; k+ 3.4; na++ 144; ca 8.9; liver normal albumin 4.0; chol 139; ldl 74; trig 97; hdl 46; hgb a1c 5.4     Review of Systems  Constitutional: Negative for malaise/fatigue.  Respiratory: Negative for cough and shortness of breath.   Cardiovascular: Negative for chest pain, palpitations and leg swelling.  Gastrointestinal: Negative for abdominal pain, constipation and heartburn.  Musculoskeletal: Positive for back pain. Negative for joint pain and myalgias.  Skin: Negative.   Neurological: Negative for dizziness.  Psychiatric/Behavioral: The patient is not nervous/anxious.     Physical Exam  Constitutional: She is oriented to person, place, and time. She appears well-developed and well-nourished. No distress.  Neck: No thyromegaly present.  Cardiovascular: Normal rate, regular rhythm and intact distal pulses.  Murmur heard. 1/6  Pulmonary/Chest: Effort normal and breath sounds normal. No respiratory distress.  Abdominal: Soft. Bowel sounds are normal. She exhibits no distension. There is no tenderness.  Musculoskeletal: Normal range of motion. She exhibits no edema.  Lymphadenopathy:    She has no cervical adenopathy.  Neurological: She is alert and oriented to person, place, and time.  Skin: Skin is warm and dry. She is not diaphoretic.  Psychiatric: She has a normal mood and affect.    ASSESSMENT/ PLAN:  TODAY:   1. Recurrent major depression with  psychotic features: stable will continue  zoloft 50 mg daily; ativan 1 mg three times daily to help manage her anxiety. She has failed lower doses of ativan.  Will continue geodon 20 mg nightly   2. Osteoarthritis: stable will continue mobic 7.5 mg daily biofreeze twice daily to posterior neck  ultram 50 mg four times daily the tylenol was stopped per her request   3. Slow transit constipation: stable will continue  miralax 17 gm daily   4. Benign essential hypertension: stable b/p 120/78 will continue norvasc 10 mg daily    PREVIOUS   5. Dyslipidemia: stable  ldl 74: will continue lipitor 10 mg daily   6.  Asthma with allergic rhinitis without complication:without change  will continue flonase daily; claritin 10 mg daily and albuterol 2 puffs every 6 hours as needed  7. Gerd without esophagitis: stable  will continue protonix 40 mg daily   8. Dysphagia oral phase: no signs of aspiration present is on thin liquids ; will continue current plan of care  9. Parkinsonism: without change will continue sinemet IR 25/100 mg three times daily   10. Vascular dementia without behavioral disturbance: stable her weight is 173 pounds; will continue namenda 10 mg twice daily and aricept 10 mg daily  will continue to monitor her status.    MD is aware of resident's narcotic use and is in agreement with current plan of care. We will attempt to wean resident as apropriate   Synthia Innocent NP [facility]  Contact [phone] Monday through Friday 8am- 5pm  After hours call [phone]

## 2018-03-16 NOTE — Progress Notes (Signed)
[id number] 

## 2018-03-18 ENCOUNTER — Other Ambulatory Visit: Payer: Self-pay

## 2018-03-18 MED ORDER — TRAMADOL HCL 50 MG PO TABS: 50.0000 mg | ORAL_TABLET | Freq: Four times a day (QID) | ORAL | 0 refills | Status: DC

## 2018-03-18 MED ORDER — LORAZEPAM 1 MG PO TABS: 1.0000 mg | ORAL_TABLET | Freq: Three times a day (TID) | ORAL | 0 refills | Status: DC

## 2018-03-18 NOTE — Telephone Encounter (Signed)
Rx faxed to [facility] (p) [phone], (f) [phone]  

## 2018-03-25 DIAGNOSIS — G3183 Dementia with Lewy bodies: Secondary | ICD-10-CM | POA: Diagnosis not present

## 2018-03-25 DIAGNOSIS — F0281 Dementia in other diseases classified elsewhere with behavioral disturbance: Secondary | ICD-10-CM | POA: Diagnosis not present

## 2018-03-25 DIAGNOSIS — F064 Anxiety disorder due to known physiological condition: Secondary | ICD-10-CM | POA: Diagnosis not present

## 2018-03-25 DIAGNOSIS — F331 Major depressive disorder, recurrent, moderate: Secondary | ICD-10-CM | POA: Diagnosis not present

## 2018-03-25 DIAGNOSIS — F5105 Insomnia due to other mental disorder: Secondary | ICD-10-CM | POA: Diagnosis not present

## 2018-04-07 DIAGNOSIS — B351 Tinea unguium: Secondary | ICD-10-CM | POA: Diagnosis not present

## 2018-04-07 DIAGNOSIS — I739 Peripheral vascular disease, unspecified: Secondary | ICD-10-CM | POA: Diagnosis not present

## 2018-04-07 DIAGNOSIS — R262 Difficulty in walking, not elsewhere classified: Secondary | ICD-10-CM | POA: Diagnosis not present

## 2018-04-08 DIAGNOSIS — F331 Major depressive disorder, recurrent, moderate: Secondary | ICD-10-CM | POA: Diagnosis not present

## 2018-04-08 DIAGNOSIS — G3183 Dementia with Lewy bodies: Secondary | ICD-10-CM | POA: Diagnosis not present

## 2018-04-08 DIAGNOSIS — F5105 Insomnia due to other mental disorder: Secondary | ICD-10-CM | POA: Diagnosis not present

## 2018-04-08 DIAGNOSIS — F0281 Dementia in other diseases classified elsewhere with behavioral disturbance: Secondary | ICD-10-CM | POA: Diagnosis not present

## 2018-04-08 DIAGNOSIS — F064 Anxiety disorder due to known physiological condition: Secondary | ICD-10-CM | POA: Diagnosis not present

## 2018-04-14 DIAGNOSIS — F331 Major depressive disorder, recurrent, moderate: Secondary | ICD-10-CM | POA: Diagnosis not present

## 2018-04-14 DIAGNOSIS — F0281 Dementia in other diseases classified elsewhere with behavioral disturbance: Secondary | ICD-10-CM | POA: Diagnosis not present

## 2018-04-14 DIAGNOSIS — F064 Anxiety disorder due to known physiological condition: Secondary | ICD-10-CM | POA: Diagnosis not present

## 2018-04-14 DIAGNOSIS — F5105 Insomnia due to other mental disorder: Secondary | ICD-10-CM | POA: Diagnosis not present

## 2018-04-14 DIAGNOSIS — G3183 Dementia with Lewy bodies: Secondary | ICD-10-CM | POA: Diagnosis not present

## 2018-04-15 ENCOUNTER — Non-Acute Institutional Stay (SKILLED_NURSING_FACILITY): Payer: Medicare Other | Admitting: Internal Medicine

## 2018-04-15 ENCOUNTER — Encounter: Payer: Self-pay | Admitting: Internal Medicine

## 2018-04-15 DIAGNOSIS — M150 Primary generalized (osteo)arthritis: Secondary | ICD-10-CM

## 2018-04-15 DIAGNOSIS — J45909 Unspecified asthma, uncomplicated: Secondary | ICD-10-CM | POA: Diagnosis not present

## 2018-04-15 DIAGNOSIS — F339 Major depressive disorder, recurrent, unspecified: Secondary | ICD-10-CM | POA: Diagnosis not present

## 2018-04-15 DIAGNOSIS — Z1239 Encounter for other screening for malignant neoplasm of breast: Secondary | ICD-10-CM

## 2018-04-15 DIAGNOSIS — F0150 Vascular dementia without behavioral disturbance: Secondary | ICD-10-CM | POA: Diagnosis not present

## 2018-04-15 DIAGNOSIS — G20 Parkinson's disease: Secondary | ICD-10-CM | POA: Diagnosis not present

## 2018-04-15 DIAGNOSIS — M159 Polyosteoarthritis, unspecified: Secondary | ICD-10-CM

## 2018-04-15 DIAGNOSIS — Z0000 Encounter for general adult medical examination without abnormal findings: Secondary | ICD-10-CM

## 2018-04-15 DIAGNOSIS — Z1231 Encounter for screening mammogram for malignant neoplasm of breast: Secondary | ICD-10-CM | POA: Diagnosis not present

## 2018-04-15 DIAGNOSIS — Z8673 Personal history of transient ischemic attack (TIA), and cerebral infarction without residual deficits: Secondary | ICD-10-CM | POA: Diagnosis not present

## 2018-04-15 DIAGNOSIS — I10 Essential (primary) hypertension: Secondary | ICD-10-CM

## 2018-04-15 NOTE — Progress Notes (Deleted)
Location:  [facility] Room Number: 111 B Place of Service:  SNF [phone]) Provider:  DR MONICA Ivan Croft, DO  Patient Care Team: Kirt Boys, DO as PCP - General (Internal Medicine) Center, [facility] (Skilled Nursing Facility)  Extended Emergency Contact Information Primary Emergency Contact: [name] [place] Home Phone: [phone] Relation: Daughter Secondary Emergency Contact: [name],[name]  [place] Home Phone: [phone] Relation: Daughter  Code Status:  Full Code Goals of care: Advanced Directive information Advanced Directives 04/15/2018  Does Patient Have a Medical Advance Directive? Yes  Type of Advance Directive Out of facility DNR (pink MOST or yellow form)  Does patient want to make changes to medical advance directive? No - Patient declined  Would patient like information on creating a medical advance directive? -  Pre-existing out of facility DNR order (yellow form or pink MOST form) Pink MOST form placed in chart (order not valid for inpatient use)     Chief Complaint  Patient presents with  . Annual Exam    AWV    HPI:  Pt is a 72 y.o. female seen today for medical management of chronic diseases.     Past Medical History:  Diagnosis Date  . Allergy   . Anxiety   . Arthritis   . Asthma   . Cataract   . Dementia   . Depression   . Difficulty walking   . Dysphagia, oral phase   . Fibromyalgia   . GERD (gastroesophageal reflux disease)   . Hallucinations   . Hyperlipidemia   . Hypertension   . Lower extremity edema   . Parkinson disease (HCC)   . Repeated falls   . Rhabdomyolysis   . Stroke (HCC)   . TIA (transient ischemic attack)   . Urinary incontinence    Past Surgical History:  Procedure Laterality Date  . ABDOMINAL HYSTERECTOMY    . CATARACT EXTRACTION    . CHOLECYSTECTOMY    . TONSILLECTOMY AND ADENOIDECTOMY      Allergies  Allergen Reactions  .  Hydrocodone Other (See Comments)    GI upset    Outpatient Encounter Medications as of 04/15/2018  Medication Sig  . albuterol (PROVENTIL HFA;VENTOLIN HFA) 108 (90 Base) MCG/ACT inhaler Inhale 2 puffs into the lungs every 6 (six) hours as needed for wheezing or shortness of breath.  Marland Kitchen aluminum-magnesium hydroxide-simethicone (MAALOX) 200-200-20 MG/5ML SUSP Take 30 mLs by mouth every 6 (six) hours as needed. Constipation  . amLODipine (NORVASC) 10 MG tablet Take 10 mg by mouth daily.  Marland Kitchen aspirin EC 81 MG tablet Take 81 mg by mouth daily.  Marland Kitchen atorvastatin (LIPITOR) 10 MG tablet Take 10 mg by mouth daily.  . Baclofen 5 MG TABS Take 1 tablet by mouth every 8 (eight) hours as needed.  . busPIRone (BUSPAR) 5 MG tablet Take 5 mg by mouth 2 (two) times daily.  . calcium carbonate (TUMS - DOSED IN MG ELEMENTAL CALCIUM) 500 MG chewable tablet Chew 1 tablet by mouth every 6 (six) hours as needed for indigestion or heartburn.  . carbidopa-levodopa (SINEMET IR) 25-100 MG tablet Take 1 tablet by mouth 3 (three) times daily.   . cholecalciferol (VITAMIN D) 1000 units tablet Take 1,000 Units by mouth daily.  . diphenhydrAMINE (BENADRYL) 25 MG tablet Take 25 mg by mouth every 8 (eight) hours as needed for itching.  . donepezil (ARICEPT) 10 MG tablet Take 10 mg by mouth at bedtime.  . DULoxetine (CYMBALTA)  30 MG capsule Give 3 tablets ( ) by mouth daily  . fluticasone (FLONASE) 50 MCG/ACT nasal spray Place 2 sprays into both nostrils daily.  Marland Kitchen gabapentin (NEURONTIN) 100 MG capsule Take 100 mg by mouth at bedtime.  . Lidocaine 4 % PTCH Apply to lower back topically one time a day every AM and remove every PM for pain management  . loperamide (IMODIUM) 2 MG capsule Take 2 mg by mouth every 6 (six) hours as needed for diarrhea or loose stools.  Marland Kitchen loratadine (CLARITIN) 10 MG tablet Take 10 mg by mouth daily as needed.   Marland Kitchen LORazepam (ATIVAN) 1 MG tablet Take 1 tablet (1 mg total) by mouth 3 (three) times daily.  .  Melatonin 3 MG TABS Take 1 tablet by mouth at bedtime.  . meloxicam (MOBIC) 7.5 MG tablet Take 15 mg by mouth daily.   . memantine (NAMENDA) 10 MG tablet Take 10 mg by mouth 2 (two) times daily.  . Menthol, Topical Analgesic, (BIOFREEZE) 4 % GEL Apply 1 application topically 2 (two) times daily. For posterior neck pain and bilateral knees  . ondansetron (ZOFRAN) 4 MG tablet Take 4 mg by mouth every 6 (six) hours as needed for nausea or vomiting.  . pantoprazole (PROTONIX) 40 MG tablet Take 40 mg by mouth daily.  . polyethylene glycol (MIRALAX / GLYCOLAX) packet Take 17 g by mouth daily.  . traMADol (ULTRAM) 50 MG tablet Take 1 tablet (50 mg total) by mouth 4 (four) times daily.  . ziprasidone (GEODON) 20 MG capsule Take 20 mg by mouth every evening.   No facility-administered encounter medications on file as of 04/15/2018.     Review of Systems  Immunization History  Administered Date(s) Administered  . Influenza-Unspecified 03/17/2017, 09/09/2017  . PPD Test 03/24/2017  . Pneumococcal Polysaccharide-23 03/17/2017   Pertinent  Health Maintenance Due  Topic Date Due  . INFLUENZA VACCINE  06/17/2018  . MAMMOGRAM  Discontinued  . DEXA SCAN  Discontinued  . COLONOSCOPY  Discontinued  . PNA vac Low Risk Adult  Discontinued   Fall Risk  05/14/2017  Falls in the past year? Yes  Number falls in past yr: 2 or more  Injury with Fall? No   Functional Status Survey:    Vitals:   04/15/18 1050  BP: 132/78  Pulse: 68  Resp: 18  Temp: (!) 97.3 F (36.3 C)  SpO2: 96%  Weight: 170 lb 14.4 oz (77.5 kg)  Height:  (1.651 m)   Body mass index is 28.44 kg/m. Physical Exam  Labs reviewed: Recent Labs    06/19/17 11/03/17 03/15/18  NA 141 142 144  K 4.0 3.6 3.4  BUN CREATININE 0.7 0.6 0.7   Recent Labs    06/19/17 11/03/17 03/15/18  AST 18 12* 11*  ALT 7 5* 6*  ALKPHOS 125 120 116   Recent Labs    11/03/17 03/15/18  WBC 9.5 6.4  NEUTROABS 8 4  HGB 12.0 12.9    HCT 35* 37  PLT 214 270   Lab Results  Component Value Date   TSH 2.49 03/19/2017   Lab Results  Component Value Date   HGBA1C 5.4 03/15/2018   Lab Results  Component Value Date   CHOL 139 03/15/2018   HDL 46 03/15/2018   LDLCALC 74 03/15/2018   TRIG 97 03/15/2018    Significant Diagnostic Results in last 30 days:  No results found.  Assessment/Plan    Family/ staff Communication:  Labs/tests ordered:    Monica S. Ancil Linsey  [facility] [address] [place], [place] [address] [phone] Cell (Monday-Friday 8 AM - 5 PM) [phone] After 5 PM and follow prompts

## 2018-04-15 NOTE — Progress Notes (Signed)
Patient ID: Jennifer Garner, female   DOB: 01-Aug-1946, 72 y.o.   MRN: [id number]  Provider:  DR Elmon Kirschner Location: [facility] Room Number: 111 B Place of Service:  SNF (31)   PCP: Kirt Boys, DO Patient Care Team: Kirt Boys, DO as PCP - General (Internal Medicine) Center, [facility] ([facility])  Extended Emergency Contact Information Primary Emergency Contact: [name] [place] Home Phone: [phone] Relation: Daughter Secondary Emergency Contact: [name],[name]  [place] Home Phone: [phone] Relation: Daughter  Code Status: Full Code Goals of Care: Advanced Directive information Advanced Directives 04/15/2018  Does Patient Have a Medical Advance Directive? Yes  Type of Advance Directive Out of facility DNR (pink MOST or yellow form)  Does patient want to make changes to medical advance directive? No - Patient declined  Would patient like information on creating a medical advance directive? -  Pre-existing out of facility DNR order (yellow form or pink MOST form) Pink MOST form placed in chart (order not valid for inpatient use)     Chief Complaint  Patient presents with  . Annual Exam    AWV    HPI: Patient is a 72 y.o. female seen today for a medicare annual wellness examination. She c/o back pain, resting tremor, numbness in legs, poor exercise tolerance. She reports difficulty sleeping 2/2 roommate agitated. She has increased anxiety and occasionally feels depressed. PHQ9 score 0 in April 2019. BIMS score 13/15. She has seen neurology in the past.  Recurrent major depression with psychotic features - mood stable on zoloft 50 mg daily; ativan 1 mg three times daily to help manage her anxiety and has failed lower doses of ativan; geodon 20 mg nightly; followed by psych services  Osteoarthritis - stable on mobic 7.5 mg daily; biofreeze twice daily to posterior neck; ultram 50 mg  four times daily. No longer on Tylenol per her request   Slow transit constipation - stable on miralax 17 gm daily   HTN - BP stable on norvasc 10 mg daily   Dyslipidemia - stable on lipitor 10 mg daily. LDL 74   Asthma with allergic rhinitis  - stable on flonase daily; claritin 10 mg daily; albuterol 2 puffs every 6 hours as needed  GERD - stable on protonix 40 mg daily   Dysphagia - stable with no signs of aspiration present;  on thin liquids  Parkinsonism - unchanged on sinemet IR 25/100 mg three times daily   Vascular dementia - no behavioral disturbance; weight down 3 lbs since last month and slowly progressive. Weight loss is to be expected with end stage dementia. She takes namenda 10 mg twice daily and aricept 10 mg daily   Past Medical History:  Diagnosis Date  . Allergy   . Anxiety   . Arthritis   . Asthma   . Cataract   . Dementia   . Depression   . Difficulty walking   . Dysphagia, oral phase   . Fibromyalgia   . GERD (gastroesophageal reflux disease)   . Hallucinations   . Hyperlipidemia   . Hypertension   . Lower extremity edema   . Parkinson disease (HCC)   . Repeated falls   . Rhabdomyolysis   . Stroke (HCC)   . TIA (transient ischemic attack)   . Urinary incontinence    Past Surgical History:  Procedure Laterality Date  . ABDOMINAL HYSTERECTOMY    . CATARACT EXTRACTION    . CHOLECYSTECTOMY    .  TONSILLECTOMY AND ADENOIDECTOMY      reports that she has never smoked. She has never used smokeless tobacco. She reports that she does not drink alcohol or use drugs. Social History   Socioeconomic History  . Marital status: Divorced    Spouse name: Not on file  . Number of children: 4  . Years of education: 80  . Highest education level: High school graduate  Occupational History  . Not on file  Social Needs  . Financial resource strain: Not on file  . Food insecurity:    Worry: Not on file    Inability: Not on file  . Transportation needs:     Medical: Not on file    Non-medical: Not on file  Tobacco Use  . Smoking status: Never Smoker  . Smokeless tobacco: Never Used  Substance and Sexual Activity  . Alcohol use: No  . Drug use: No  . Sexual activity: Never  Lifestyle  . Physical activity:    Days per week: Not on file    Minutes per session: Not on file  . Stress: Not on file  Relationships  . Social connections:    Talks on phone: Not on file    Gets together: Not on file    Attends religious service: Not on file    Active member of club or organization: Not on file    Attends meetings of clubs or organizations: Not on file    Relationship status: Not on file  Other Topics Concern  . Not on file  Social History Narrative   Lives at [facility]   Right-handed.   2 cups caffeine per day.   Family History  Problem Relation Age of Onset  . Non-Hodgkin's lymphoma Mother   . Stroke Mother   . Heart attack Father     Pertinent  Health Maintenance Due  Topic Date Due  . INFLUENZA VACCINE  06/17/2018  . MAMMOGRAM  Discontinued  . DEXA SCAN  Discontinued  . COLONOSCOPY  Discontinued  . PNA vac Low Risk Adult  Discontinued   Fall Risk  05/14/2017  Falls in the past year? Yes  Number falls in past yr: 2 or more  Injury with Fall? No   Depression screen PHQ 2/9 05/14/2017  Decreased Interest 0  Down, Depressed, Hopeless 0  PHQ - 2 Score 0    Functional Status Survey: Is the patient deaf or have difficulty hearing?: No Does the patient have difficulty seeing, even when wearing glasses/contacts?: Yes Does the patient have difficulty concentrating, remembering, or making decisions?: Yes Does the patient have difficulty walking or climbing stairs?: Yes Does the patient have difficulty dressing or bathing?: Yes Does the patient have difficulty doing errands alone such as visiting a doctor's office or shopping?: Yes  Allergies  Allergen Reactions  . Hydrocodone Other (See Comments)    GI upset     Outpatient Encounter Medications as of 04/15/2018  Medication Sig  . albuterol (PROVENTIL HFA;VENTOLIN HFA) 108 (90 Base) MCG/ACT inhaler Inhale 2 puffs into the lungs every 6 (six) hours as needed for wheezing or shortness of breath.  Marland Kitchen aluminum-magnesium hydroxide-simethicone (MAALOX) 200-200-20 MG/5ML SUSP Take 30 mLs by mouth every 6 (six) hours as needed. Constipation  . amLODipine (NORVASC) 10 MG tablet Take 10 mg by mouth daily.  Marland Kitchen aspirin EC 81 MG tablet Take 81 mg by mouth daily.  Marland Kitchen atorvastatin (LIPITOR) 10 MG tablet Take 10 mg by mouth daily.  . Baclofen 5 MG TABS Take  1 tablet by mouth every 8 (eight) hours as needed.  . busPIRone (BUSPAR) 5 MG tablet Take 5 mg by mouth 2 (two) times daily.  . calcium carbonate (TUMS - DOSED IN MG ELEMENTAL CALCIUM) 500 MG chewable tablet Chew 1 tablet by mouth every 6 (six) hours as needed for indigestion or heartburn.  . carbidopa-levodopa (SINEMET IR) 25-100 MG tablet Take 1 tablet by mouth 3 (three) times daily.   . cholecalciferol (VITAMIN D) 1000 units tablet Take 1,000 Units by mouth daily.  . diphenhydrAMINE (BENADRYL) 25 MG tablet Take 25 mg by mouth every 8 (eight) hours as needed for itching.  . donepezil (ARICEPT) 10 MG tablet Take 10 mg by mouth at bedtime.  . DULoxetine (CYMBALTA) 30 MG capsule Give 3 tablets ( ) by mouth daily  . fluticasone (FLONASE) 50 MCG/ACT nasal spray Place 2 sprays into both nostrils daily.  Marland Kitchen gabapentin (NEURONTIN) 100 MG capsule Take 100 mg by mouth at bedtime.  . Lidocaine 4 % PTCH Apply to lower back topically one time a day every AM and remove every PM for pain management  . loperamide (IMODIUM) 2 MG capsule Take 2 mg by mouth every 6 (six) hours as needed for diarrhea or loose stools.  Marland Kitchen loratadine (CLARITIN) 10 MG tablet Take 10 mg by mouth daily as needed.   Marland Kitchen LORazepam (ATIVAN) 1 MG tablet Take 1 tablet (1 mg total) by mouth 3 (three) times daily.  . Melatonin 3 MG TABS Take 1 tablet by mouth at  bedtime.  . meloxicam (MOBIC) 7.5 MG tablet Take 15 mg by mouth daily.   . memantine (NAMENDA) 10 MG tablet Take 10 mg by mouth 2 (two) times daily.  . Menthol, Topical Analgesic, (BIOFREEZE) 4 % GEL Apply 1 application topically 2 (two) times daily. For posterior neck pain and bilateral knees  . ondansetron (ZOFRAN) 4 MG tablet Take 4 mg by mouth every 6 (six) hours as needed for nausea or vomiting.  . pantoprazole (PROTONIX) 40 MG tablet Take 40 mg by mouth daily.  . polyethylene glycol (MIRALAX / GLYCOLAX) packet Take 17 g by mouth daily.  . traMADol (ULTRAM) 50 MG tablet Take 1 tablet (50 mg total) by mouth 4 (four) times daily.  . ziprasidone (GEODON) 20 MG capsule Take 20 mg by mouth every evening.   No facility-administered encounter medications on file as of 04/15/2018.     Review of Systems  Musculoskeletal: Positive for arthralgias, back pain and gait problem.  Neurological: Positive for tremors and numbness.  Psychiatric/Behavioral: Positive for sleep disturbance. The patient is nervous/anxious.   All other systems reviewed and are negative.   Vitals:   04/15/18 1050  BP: 132/78  Pulse: 68  Resp: 18  Temp: (!) 97.3 F (36.3 C)  SpO2: 96%  Weight: 170 lb 14.4 oz (77.5 kg)  Height:  (1.651 m)   Body mass index is 28.44 kg/m. Physical Exam  Constitutional: She appears well-developed. No distress.  Frail appearing in NAD, lying in bed  HENT:  Head: Normocephalic and atraumatic.  Right Ear: Hearing, tympanic membrane, external ear and ear canal normal.  Left Ear: Hearing, tympanic membrane, external ear and ear canal normal.  Mouth/Throat: Uvula is midline, oropharynx is clear and moist and mucous membranes are normal. She does not have dentures.  Right TM dul with air fluid level; left TM intact  Eyes: Pupils are equal, round, and reactive to light. Conjunctivae, EOM and lids are normal. No scleral icterus.  Neck: Trachea  normal and normal range of motion. Neck  supple. Carotid bruit is not present. No thyroid mass and no thyromegaly present.  Cardiovascular: Normal rate, regular rhythm and intact distal pulses. Exam reveals no gallop and no friction rub.  Murmur (1/6 SEM) heard. No LE edema b/l. No calf TTP.   Pulmonary/Chest: Effort normal and breath sounds normal. She has no wheezes. She has no rhonchi. She has no rales. She exhibits no mass. Right breast exhibits no inverted nipple, no mass, no nipple discharge, no skin change and no tenderness. Left breast exhibits no inverted nipple, no mass, no nipple discharge, no skin change and no tenderness. Breasts are symmetrical.  Abdominal: Soft. Normal appearance, normal aorta and bowel sounds are normal. She exhibits no pulsatile midline mass and no mass. There is no hepatosplenomegaly. There is no tenderness. There is no rigidity, no rebound and no guarding. No hernia.  Musculoskeletal: Normal range of motion. She exhibits no deformity.  Lymphadenopathy:       Head (right side): No posterior auricular adenopathy present.       Head (left side): No posterior auricular adenopathy present.    She has no cervical adenopathy.       Right: No supraclavicular adenopathy present.       Left: No supraclavicular adenopathy present.  Neurological: She is alert. She has normal strength and normal reflexes. She displays tremor (resting). No cranial nerve deficit. Gait abnormal.  Skin: Skin is warm, dry and intact. No rash noted. Nails show no clubbing.  Psychiatric: She has a normal mood and affect. Her speech is normal and behavior is normal. Thought content normal. Cognition and memory are normal.    Labs reviewed: Basic Metabolic Panel: Recent Labs    06/19/17 11/03/17 03/15/18  NA 141 142 144  K 4.0 3.6 3.4  BUN CREATININE 0.7 0.6 0.7   Liver Function Tests: Recent Labs    06/19/17 11/03/17 03/15/18  AST 18 12* 11*  ALT 7 5* 6*  ALKPHOS 125 120 116   No results for input(s): LIPASE,  AMYLASE in the last 8760 hours. No results for input(s): AMMONIA in the last 8760 hours. CBC: Recent Labs    11/03/17 03/15/18  WBC 9.5 6.4  NEUTROABS 8 4  HGB 12.0 12.9  HCT 35* 37  PLT 214 270   Cardiac Enzymes: No results for input(s): CKTOTAL, CKMB, CKMBINDEX, TROPONINI in the last 8760 hours. BNP: Invalid input(s): POCBNP Lab Results  Component Value Date   HGBA1C 5.4 03/15/2018   Lab Results  Component Value Date   TSH 2.49 03/19/2017   No results found for: VITAMINB12 No results found for: FOLATE No results found for: IRON, TIBC, FERRITIN  Imaging and Procedures obtained recently: No results found.  Assessment/Plan   ICD-10-CM   1. Encounter for Medicare annual wellness exam Z00.00   2. Vascular dementia without behavioral disturbance F01.50   3. Breast cancer screening Z12.31   4. Primary osteoarthritis involving multiple joints M15.0   5. Benign essential hypertension I10   6. Parkinson disease (HCC) G20   7. Recurrent major depressive disorder, remission status unspecified (HCC) F33.9   8. Asthma with allergic rhinitis without complication, unspecified asthma severity J45.909   9. History of CVA in adulthood Z86.73    Cont current meds as ordered  Psych services to follow  PT/OT/ST as indicated  Will follow  Labs/tests ordered: b/l screening mammogram   Emillio Ngo S. Montez Morita, D. O., F. A. C. O. I.  [facility] [address] [place], [place] [address] [phone] Cell (Monday-Friday 8 AM - 5 PM) [phone] After 5 PM and follow prompts

## 2018-04-19 ENCOUNTER — Other Ambulatory Visit: Payer: Self-pay

## 2018-04-19 ENCOUNTER — Other Ambulatory Visit: Payer: Self-pay | Admitting: Adult Health

## 2018-04-19 DIAGNOSIS — Z1231 Encounter for screening mammogram for malignant neoplasm of breast: Secondary | ICD-10-CM

## 2018-04-19 MED ORDER — LORAZEPAM 1 MG PO TABS: 1.0000 mg | ORAL_TABLET | Freq: Three times a day (TID) | ORAL | 0 refills | Status: DC

## 2018-04-19 MED ORDER — TRAMADOL HCL 50 MG PO TABS: 50.0000 mg | ORAL_TABLET | Freq: Four times a day (QID) | ORAL | 0 refills | Status: DC

## 2018-04-19 NOTE — Telephone Encounter (Signed)
Rx faxed to [facility] (P) [phone], (F) [phone] 

## 2018-04-21 ENCOUNTER — Non-Acute Institutional Stay (SKILLED_NURSING_FACILITY): Payer: Medicare Other | Admitting: Adult Health

## 2018-04-21 ENCOUNTER — Other Ambulatory Visit: Payer: Self-pay

## 2018-04-21 ENCOUNTER — Encounter: Payer: Self-pay | Admitting: Adult Health

## 2018-04-21 DIAGNOSIS — M545 Low back pain, unspecified: Secondary | ICD-10-CM

## 2018-04-21 DIAGNOSIS — G20 Parkinson's disease: Secondary | ICD-10-CM | POA: Diagnosis not present

## 2018-04-21 DIAGNOSIS — F330 Major depressive disorder, recurrent, mild: Secondary | ICD-10-CM

## 2018-04-21 DIAGNOSIS — G8929 Other chronic pain: Secondary | ICD-10-CM | POA: Diagnosis not present

## 2018-04-21 MED ORDER — LORAZEPAM 1 MG PO TABS: ORAL_TABLET | ORAL | 0 refills | Status: DC

## 2018-04-21 NOTE — Telephone Encounter (Signed)
Rx faxed to [facility] (P) [phone], (F) [phone] 

## 2018-04-21 NOTE — Progress Notes (Signed)
Location:   [facility] Room Number: 111 B Place of Service:  SNF (31)   CODE STATUS: Full Code (Most form updated 03/17/17)  Allergies  Allergen Reactions  . Hydrocodone Other (See Comments)    GI upset    Chief Complaint  Patient presents with  . Acute Visit    Multiple concerns    HPI:  She has numerous concerns. 1: her anxiety is out of control; she feels as though she cannot stop shaking on "the inside". 2: her tremors are worse;she feels as though the medication times are not adequate to meet her needs. 3: she is having increased back pain with spasms. We have discussed her medication regimen. I am willing to try her ativan every 6 hours at this time.   Past Medical History:  Diagnosis Date  . Allergy   . Anxiety   . Arthritis   . Asthma   . Cataract   . Dementia   . Depression   . Difficulty walking   . Dysphagia, oral phase   . Fibromyalgia   . GERD (gastroesophageal reflux disease)   . Hallucinations   . Hyperlipidemia   . Hypertension   . Lower extremity edema   . Parkinson disease (HCC)   . Repeated falls   . Rhabdomyolysis   . Stroke (HCC)   . TIA (transient ischemic attack)   . Urinary incontinence     Past Surgical History:  Procedure Laterality Date  . ABDOMINAL HYSTERECTOMY    . CATARACT EXTRACTION    . CHOLECYSTECTOMY    . TONSILLECTOMY AND ADENOIDECTOMY      Social History   Socioeconomic History  . Marital status: Divorced    Spouse name: Not on file  . Number of children: 4  . Years of education: 5112  . Highest education level: High school graduate  Occupational History  . Not on file  Social Needs  . Financial resource strain: Not on file  . Food insecurity:    Worry: Not on file    Inability: Not on file  . Transportation needs:    Medical: Not on file    Non-medical: Not on file  Tobacco Use  . Smoking status: Never Smoker  . Smokeless tobacco: Never Used  Substance and Sexual Activity  . Alcohol use:  No  . Drug use: No  . Sexual activity: Never  Lifestyle  . Physical activity:    Days per week: Not on file    Minutes per session: Not on file  . Stress: Not on file  Relationships  . Social connections:    Talks on phone: Not on file    Gets together: Not on file    Attends religious service: Not on file    Active member of club or organization: Not on file    Attends meetings of clubs or organizations: Not on file    Relationship status: Not on file  . Intimate partner violence:    Fear of current or ex partner: Not on file    Emotionally abused: Not on file    Physically abused: Not on file    Forced sexual activity: Not on file  Other Topics Concern  . Not on file  Social History Narrative   [facility]   Right-handed.   2 cups caffeine per day.   Family History  Problem Relation Age of Onset  . Non-Hodgkin's lymphoma Mother   . Stroke Mother   . Heart attack Father  VITAL SIGNS BP 131/71   Pulse 68   Temp (!) 97.3 F (36.3 C)   Resp 18   Ht 5\' 5"  (1.651 m)   Wt 170 lb 14.4 oz (77.5 kg)   SpO2 96%   BMI 28.44 kg/m   Outpatient Encounter Medications as of 04/21/2018  Medication Sig  . albuterol (PROVENTIL HFA;VENTOLIN HFA) 108 (90 Base) MCG/ACT inhaler Inhale 2 puffs into the lungs every 6 (six) hours as needed for wheezing or shortness of breath.  Marland Kitchen aluminum-magnesium hydroxide-simethicone (MAALOX) 200-200-20 MG/5ML SUSP Take 30 mLs by mouth every 6 (six) hours as needed. Constipation  . amLODipine (NORVASC) 10 MG tablet Take 10 mg by mouth daily.  Marland Kitchen aspirin EC 81 MG tablet Take 81 mg by mouth daily.  Marland Kitchen atorvastatin (LIPITOR) 10 MG tablet Take 10 mg by mouth daily.  . Baclofen 5 MG TABS Take 1 tablet by mouth every 8 (eight) hours as needed.  . busPIRone (BUSPAR) 5 MG tablet Take 5 mg by mouth 2 (two) times daily.  . calcium carbonate (TUMS - DOSED IN MG ELEMENTAL CALCIUM) 500 MG chewable tablet Chew 1 tablet by mouth every 6 (six) hours  as needed for indigestion or heartburn.  . carbidopa-levodopa (SINEMET IR) 25-100 MG tablet Take 1 tablet by mouth 3 (three) times daily.   . cholecalciferol (VITAMIN D) 1000 units tablet Take 1,000 Units by mouth daily.  . diphenhydrAMINE (BENADRYL) 25 MG tablet Take 25 mg by mouth every 8 (eight) hours as needed for itching.  . donepezil (ARICEPT) 10 MG tablet Take 10 mg by mouth at bedtime.  . DULoxetine (CYMBALTA) 30 MG capsule Give 3 tablets (90mg ) by mouth daily  . fluticasone (FLONASE) 50 MCG/ACT nasal spray Place 2 sprays into both nostrils daily.  Marland Kitchen gabapentin (NEURONTIN) 100 MG capsule Take 100 mg by mouth at bedtime.  . Lidocaine 4 % PTCH Apply to lower back topically one time a day every AM and remove every PM for pain management  . loperamide (IMODIUM) 2 MG capsule Take 2 mg by mouth every 6 (six) hours as needed for diarrhea or loose stools.  Marland Kitchen loratadine (CLARITIN) 10 MG tablet Take 10 mg by mouth daily as needed.   Marland Kitchen LORazepam (ATIVAN) 1 MG tablet Take 1 tablet (1 mg total) by mouth 3 (three) times daily.  . Melatonin 3 MG TABS Take 1 tablet by mouth at bedtime.  . meloxicam (MOBIC) 7.5 MG tablet Take 15 mg by mouth daily.   . memantine (NAMENDA) 10 MG tablet Take 10 mg by mouth 2 (two) times daily.  . Menthol, Topical Analgesic, (BIOFREEZE) 4 % GEL Apply 1 application topically 2 (two) times daily. For posterior neck pain and bilateral knees  . ondansetron (ZOFRAN) 4 MG tablet Take 4 mg by mouth every 6 (six) hours as needed for nausea or vomiting.  . pantoprazole (PROTONIX) 40 MG tablet Take 40 mg by mouth daily.  . polyethylene glycol (MIRALAX / GLYCOLAX) packet Take 17 g by mouth daily.  . traMADol (ULTRAM) 50 MG tablet Take 1 tablet (50 mg total) by mouth 4 (four) times daily.  . ziprasidone (GEODON) 20 MG capsule Take 20 mg by mouth every evening.   No facility-administered encounter medications on file as of 04/21/2018.      SIGNIFICANT DIAGNOSTIC EXAMS  NO NEW  EXAMS  LABS REVIEWED:PREVIOUS   03-19-17: wbc 4.7; hgb 14.5; hct 40.0; mcv 88.8; plt 229; glucose 95; bun 21.6; creat 0.55; k+ 3.0; na++ 144; ca 9.7  liver normal albumin 4.5; tsh 2.69; hgb a1c 4.7; chol 121; ldl 56; trig 134; hdl 39  11-22-08: Vit D 33.37 06-19-17: glucose 122; bun 17.3; creat 0.71; k+ 4.0; na++ 141; ca 9.4; liver normal 4.8 11-03-17: wbc 9.5; hgb 12.0; hct 34.6; mcv 90.7; plt 214; glucose 105; bun 16.1; creat 0.59 ;k+ 3.6; na++ 142; ca 8.9; liver normal albumin 4.2 03-15-18: wbc 6.4; hgb 12.9; hct 37.3; mcv 88.;5 plt 270; glucose 97; bun 12.6; creat 0.71; k+ 3.4; na++ 144; ca 8.9; liver normal albumin 4.0; chol 139; ldl 74; trig 97; hdl 46; hgb a1c 5.4    NO NEW LABS.    Review of Systems  Constitutional: Negative for malaise/fatigue.  Respiratory: Negative for cough and shortness of breath.   Cardiovascular: Negative for chest pain, palpitations and leg swelling.  Gastrointestinal: Negative for abdominal pain, constipation and heartburn.  Musculoskeletal: Positive for back pain. Negative for joint pain and myalgias.       Has spasms  Skin: Negative.   Neurological: Positive for tremors. Negative for dizziness.  Psychiatric/Behavioral: The patient is nervous/anxious.     Physical Exam  Constitutional: She is oriented to person, place, and time. She appears well-developed and well-nourished. No distress.  Neck: No thyromegaly present.  Cardiovascular: Normal rate, regular rhythm and intact distal pulses.  Murmur heard. 1/6  Pulmonary/Chest: Effort normal and breath sounds normal. No respiratory distress.  Abdominal: Soft. Bowel sounds are normal. She exhibits no distension. There is no tenderness.  Musculoskeletal: Normal range of motion. She exhibits no edema.  Lymphadenopathy:    She has no cervical adenopathy.  Neurological: She is alert and oriented to person, place, and time.  Does have bilateral hand tremors   Skin: Skin is warm and dry. She is not diaphoretic.   Psychiatric:  Is anxious    ASSESSMENT/ PLAN:  TODAY:   1. Recurrent major depression with psychotic features: is worsewill continue cymbalta 90 mg daily  geodon 20 mg nightly   Will increase her ativan to 1 mg every 6 hours and will monitor her status   2. Parkinsonism: without change will continue sinemet IR 25/100 mg three times daily will change the times to 6A; 12P; 6 P to better meet her needs  3. Chronic bilateral lower back pain without sciatica: worse; will continue ultram 50 mg four times daily; lidoderm patch to back; biofreeze to neck and knees and will change baclofen to 5 mg every 8 hours routinely.      MD is aware of resident's narcotic use and is in agreement with current plan of care. We will attempt to wean resident as apropriate   Synthia Innocent NP [facility]  Contact [phone] Monday through Friday 8am- 5pm  After hours call [phone]

## 2018-05-11 ENCOUNTER — Telehealth: Payer: Self-pay | Admitting: *Deleted

## 2018-05-11 DIAGNOSIS — F0281 Dementia in other diseases classified elsewhere with behavioral disturbance: Secondary | ICD-10-CM | POA: Diagnosis not present

## 2018-05-11 DIAGNOSIS — F331 Major depressive disorder, recurrent, moderate: Secondary | ICD-10-CM | POA: Diagnosis not present

## 2018-05-11 DIAGNOSIS — F064 Anxiety disorder due to known physiological condition: Secondary | ICD-10-CM | POA: Diagnosis not present

## 2018-05-11 DIAGNOSIS — F5105 Insomnia due to other mental disorder: Secondary | ICD-10-CM | POA: Diagnosis not present

## 2018-05-11 DIAGNOSIS — G3183 Dementia with Lewy bodies: Secondary | ICD-10-CM | POA: Diagnosis not present

## 2018-05-11 NOTE — Telephone Encounter (Signed)
PA request from [employer] RE Lorazepam quantity override. Completed via Cover My Meds and approved.

## 2018-05-12 ENCOUNTER — Inpatient Hospital Stay: Admission: RE | Admit: 2018-05-12 | Payer: Medicare Other | Source: Ambulatory Visit

## 2018-05-13 ENCOUNTER — Encounter: Payer: Self-pay | Admitting: Adult Health

## 2018-05-13 ENCOUNTER — Non-Acute Institutional Stay (SKILLED_NURSING_FACILITY): Payer: Medicare Other | Admitting: Adult Health

## 2018-05-13 DIAGNOSIS — M150 Primary generalized (osteo)arthritis: Secondary | ICD-10-CM | POA: Diagnosis not present

## 2018-05-13 DIAGNOSIS — M159 Polyosteoarthritis, unspecified: Secondary | ICD-10-CM

## 2018-05-13 DIAGNOSIS — I10 Essential (primary) hypertension: Secondary | ICD-10-CM | POA: Diagnosis not present

## 2018-05-13 DIAGNOSIS — K5901 Slow transit constipation: Secondary | ICD-10-CM

## 2018-05-13 NOTE — Progress Notes (Signed)
Location:   [facility] Room Number: 111 B Place of Service:  SNF (31)   CODE STATUS: Full Code (Most form updated 03/17/17)  Allergies  Allergen Reactions  . Hydrocodone Other (See Comments)    GI upset    Chief Complaint  Patient presents with  . Medical Management of Chronic Issues    Hypertension; constipation; osteoarthritis     HPI:  She is a long term resident of this facility being seen for the management of her chronic illnesses: hypertension; constipation; osteoarthritis. She denies any headaches; constipation; no uncontrolled pain. She is wanting to have therapy started once again. There are no nursing concerns at this time.    Past Medical History:  Diagnosis Date  . Allergy   . Anxiety   . Arthritis   . Asthma   . Cataract   . Dementia   . Depression   . Difficulty walking   . Dysphagia, oral phase   . Fibromyalgia   . GERD (gastroesophageal reflux disease)   . Hallucinations   . Hyperlipidemia   . Hypertension   . Lower extremity edema   . Parkinson disease (HCC)   . Repeated falls   . Rhabdomyolysis   . Stroke (HCC)   . TIA (transient ischemic attack)   . Urinary incontinence     Past Surgical History:  Procedure Laterality Date  . ABDOMINAL HYSTERECTOMY    . CATARACT EXTRACTION    . CHOLECYSTECTOMY    . TONSILLECTOMY AND ADENOIDECTOMY      Social History   Socioeconomic History  . Marital status: Divorced    Spouse name: Not on file  . Number of children: 4  . Years of education: 4112  . Highest education level: High school graduate  Occupational History  . Not on file  Social Needs  . Financial resource strain: Not on file  . Food insecurity:    Worry: Not on file    Inability: Not on file  . Transportation needs:    Medical: Not on file    Non-medical: Not on file  Tobacco Use  . Smoking status: Never Smoker  . Smokeless tobacco: Never Used  Substance and Sexual Activity  . Alcohol use: No  . Drug use: No    . Sexual activity: Never  Lifestyle  . Physical activity:    Days per week: Not on file    Minutes per session: Not on file  . Stress: Not on file  Relationships  . Social connections:    Talks on phone: Not on file    Gets together: Not on file    Attends religious service: Not on file    Active member of club or organization: Not on file    Attends meetings of clubs or organizations: Not on file    Relationship status: Not on file  . Intimate partner violence:    Fear of current or ex partner: Not on file    Emotionally abused: Not on file    Physically abused: Not on file    Forced sexual activity: Not on file  Other Topics Concern  . Not on file  Social History Narrative   [facility]   Right-handed.   2 cups caffeine per day.   Family History  Problem Relation Age of Onset  . Non-Hodgkin's lymphoma Mother   . Stroke Mother   . Heart attack Father       VITAL SIGNS BP 115/67   Pulse 76  Temp (!) 96.9 F (36.1 C)   Resp 20   Ht 5\' 5"  (1.651 m)   Wt 170 lb 14.4 oz (77.5 kg)   SpO2 96%   BMI 28.44 kg/m   Outpatient Encounter Medications as of 05/13/2018  Medication Sig  . albuterol (PROVENTIL HFA;VENTOLIN HFA) 108 (90 Base) MCG/ACT inhaler Inhale 2 puffs into the lungs every 6 (six) hours as needed for wheezing or shortness of breath.  Marland Kitchen aluminum-magnesium hydroxide-simethicone (MAALOX) 200-200-20 MG/5ML SUSP Take 30 mLs by mouth every 6 (six) hours as needed. Constipation  . amLODipine (NORVASC) 10 MG tablet Take 10 mg by mouth daily.  Marland Kitchen aspirin EC 81 MG tablet Take 81 mg by mouth daily.  Marland Kitchen atorvastatin (LIPITOR) 10 MG tablet Take 10 mg by mouth daily.  . Baclofen 5 MG TABS Take 1 tablet by mouth every 8 (eight) hours as needed.  . busPIRone (BUSPAR) 5 MG tablet Take 5 mg by mouth 2 (two) times daily.  . calcium carbonate (TUMS - DOSED IN MG ELEMENTAL CALCIUM) 500 MG chewable tablet Chew 1 tablet by mouth every 6 (six) hours as needed for  indigestion or heartburn.  . carbidopa-levodopa (SINEMET IR) 25-100 MG tablet Take 1 tablet by mouth 3 (three) times daily.   . cholecalciferol (VITAMIN D) 1000 units tablet Take 1,000 Units by mouth daily.  . diphenhydrAMINE (BENADRYL) 25 MG tablet Take 25 mg by mouth every 8 (eight) hours as needed for itching.  . donepezil (ARICEPT) 10 MG tablet Take 10 mg by mouth at bedtime.  . DULoxetine (CYMBALTA) 30 MG capsule Give 3 tablets (90mg ) by mouth daily  . fluticasone (FLONASE) 50 MCG/ACT nasal spray Place 2 sprays into both nostrils daily.  Marland Kitchen gabapentin (NEURONTIN) 100 MG capsule Take 100 mg by mouth at bedtime.  . Lidocaine 4 % PTCH Apply to lower back topically one time a day every AM and remove every PM for pain management  . loperamide (IMODIUM) 2 MG capsule Take 2 mg by mouth every 6 (six) hours as needed for diarrhea or loose stools.  Marland Kitchen loratadine (CLARITIN) 10 MG tablet Take 10 mg by mouth daily as needed.   Marland Kitchen LORazepam (ATIVAN) 1 MG tablet Give 1 tablet by mouth every 6 hours routinely  . Melatonin 3 MG TABS Take 1 tablet by mouth at bedtime.  . meloxicam (MOBIC) 7.5 MG tablet Take 15 mg by mouth daily.   . memantine (NAMENDA) 10 MG tablet Take 10 mg by mouth 2 (two) times daily.  . Menthol, Topical Analgesic, (BIOFREEZE) 4 % GEL Apply 1 application topically 2 (two) times daily. For posterior neck pain and bilateral knees  . ondansetron (ZOFRAN) 4 MG tablet Take 4 mg by mouth every 6 (six) hours as needed for nausea or vomiting.  . pantoprazole (PROTONIX) 40 MG tablet Take 40 mg by mouth daily.  . polyethylene glycol (MIRALAX / GLYCOLAX) packet Take 17 g by mouth daily as needed.   . traMADol (ULTRAM) 50 MG tablet Take 1 tablet (50 mg total) by mouth 4 (four) times daily.  . ziprasidone (GEODON) 20 MG capsule Take 20 mg by mouth every evening.   No facility-administered encounter medications on file as of 05/13/2018.      SIGNIFICANT DIAGNOSTIC EXAMS  NO NEW EXAMS  LABS  REVIEWED:PREVIOUS   05-19-17: Vit D 33.37 06-19-17: glucose 122; bun 17.3; creat 0.71; k+ 4.0; na++ 141; ca 9.4; liver normal 4.8 11-03-17: wbc 9.5; hgb 12.0; hct 34.6; mcv 90.7; plt 214; glucose 105;  bun 16.1; creat 0.59 ;k+ 3.6; na++ 142; ca 8.9; liver normal albumin 4.2 03-15-18: wbc 6.4; hgb 12.9; hct 37.3; mcv 88.;5 plt 270; glucose 97; bun 12.6; creat 0.71; k+ 3.4; na++ 144; ca 8.9; liver normal albumin 4.0; chol 139; ldl 74; trig 97; hdl 46; hgb a1c 5.4    NO NEW LABS.    Review of Systems  Constitutional: Negative for malaise/fatigue.  Respiratory: Negative for cough and shortness of breath.   Cardiovascular: Negative for chest pain, palpitations and leg swelling.  Gastrointestinal: Negative for abdominal pain, constipation and heartburn.  Musculoskeletal: Negative for back pain, joint pain and myalgias.  Skin: Negative.   Neurological: Negative for dizziness.  Psychiatric/Behavioral: The patient is not nervous/anxious.     Physical Exam  Constitutional: She is oriented to person, place, and time. She appears well-developed and well-nourished. No distress.  Neck: No thyromegaly present.  Cardiovascular: Normal rate, regular rhythm and intact distal pulses.  Murmur heard. 1/6  Pulmonary/Chest: Effort normal and breath sounds normal. No respiratory distress.  Abdominal: Soft. Bowel sounds are normal. She exhibits no distension. There is no tenderness.  Musculoskeletal: Normal range of motion. She exhibits no edema.  Slight bilateral resting tremors   Lymphadenopathy:    She has no cervical adenopathy.  Neurological: She is alert and oriented to person, place, and time.  Skin: Skin is warm and dry. She is not diaphoretic.  Psychiatric: She has a normal mood and affect.    ASSESSMENT/ PLAN:  TODAY:   1. Primary osteoarthritis involving multiple joints: stable will continue mobic 15 mg daily biofreeze twice daily to posterior neck  ultram 50 mg four times daily   2. Slow  transit constipation: stable will continue  miralax 17 gm daily   3. Benign essential hypertension: stable b/p 115/67 will continue norvasc 10 mg daily    PREVIOUS   4. Dyslipidemia: stable  ldl 74: will continue lipitor 10 mg daily   5.  Asthma with allergic rhinitis without complication:without change  will continue flonase daily; claritin 10 mg daily as needed and albuterol 2 puffs every 6 hours as needed  6. Gerd without esophagitis: stable  will continue protonix 40 mg daily   7. Dysphagia oral phase: no signs of aspiration present is on thin liquids ; will continue current plan of care  8. Vascular dementia without behavioral disturbance: stable her weight is 173 pounds; will continue namenda 10 mg twice daily and aricept 10 mg daily  will continue to monitor her status.  9. Recurrent major depression with psychotic features: is worsewill continue cymbalta 90 mg daily  geodon 20 mg nightly ativan 1 mg every 6 hours and will monitor her status   10. Parkinsonism: without change will continue sinemet IR 25/100 mg three times daily   11. Chronic bilateral lower back pain without sciatica: stable ; will continue ultram 50 mg four times daily; mobic 15 mg daily  lidoderm patch to back; biofreeze to neck and knees and baclofen to 5 mg every 8 hours prn     Will have therapy evaluate and treat as indicated for gait balance ans strength.    MD is aware of resident's narcotic use and is in agreement with current plan of care. We will attempt to wean resident as apropriate   Synthia Innocent NP [facility]  Contact [phone] Monday through Friday 8am- 5pm  After hours call [phone]

## 2018-05-18 ENCOUNTER — Other Ambulatory Visit: Payer: Self-pay

## 2018-05-18 MED ORDER — TRAMADOL HCL 50 MG PO TABS: 50.0000 mg | ORAL_TABLET | Freq: Four times a day (QID) | ORAL | 0 refills | Status: DC

## 2018-05-18 NOTE — Telephone Encounter (Signed)
Rx faxed to [facility] (P) [phone], (F) [phone] 

## 2018-05-27 ENCOUNTER — Non-Acute Institutional Stay (SKILLED_NURSING_FACILITY): Payer: Medicare Other

## 2018-05-27 DIAGNOSIS — R269 Unspecified abnormalities of gait and mobility: Secondary | ICD-10-CM | POA: Diagnosis not present

## 2018-05-27 DIAGNOSIS — Z0000 Encounter for general adult medical examination without abnormal findings: Secondary | ICD-10-CM

## 2018-05-27 DIAGNOSIS — R531 Weakness: Secondary | ICD-10-CM | POA: Diagnosis not present

## 2018-05-27 DIAGNOSIS — R262 Difficulty in walking, not elsewhere classified: Secondary | ICD-10-CM | POA: Diagnosis not present

## 2018-05-27 DIAGNOSIS — M179 Osteoarthritis of knee, unspecified: Secondary | ICD-10-CM | POA: Diagnosis not present

## 2018-05-27 NOTE — Progress Notes (Signed)
Subjective:   Jennifer Garner is a 71 y.o. female who presents for Medicare Annual (Subsequent) preventive examination at [facility]  Last AWV-05/14/2017    Objective:     Vitals: BP 120/65 (BP Location: Left Arm, Patient Position: Supine)   Pulse 75   Temp (!) 97.5 F (36.4 C) (Oral)   Ht 5\' 5"  (1.651 m)   Wt 170 lb (77.1 kg)   BMI 28.29 kg/m   Body mass index is 28.29 kg/m.  Advanced Directives 05/27/2018 05/13/2018 04/21/2018 04/15/2018 03/16/2018 03/10/2018 02/15/2018  Does Patient Have a Medical Advance Directive? Yes Yes Yes Yes Yes Yes Yes  Type of Advance Directive Out of facility DNR (pink MOST or yellow form) Out of facility DNR (pink MOST or yellow form) Out of facility DNR (pink MOST or yellow form) Out of facility DNR (pink MOST or yellow form) Out of facility DNR (pink MOST or yellow form) Out of facility DNR (pink MOST or yellow form) Out of facility DNR (pink MOST or yellow form)  Does patient want to make changes to medical advance directive? No - Patient declined No - Patient declined No - Patient declined No - Patient declined No - Patient declined No - Patient declined No - Patient declined  Would patient like information on creating a medical advance directive? - - - - - - -  Pre-existing out of facility DNR order (yellow form or pink MOST form) Pink MOST form placed in chart (order not valid for inpatient use) Pink MOST form placed in chart (order not valid for inpatient use) Pink MOST form placed in chart (order not valid for inpatient use) Pink MOST form placed in chart (order not valid for inpatient use) Pink MOST form placed in chart (order not valid for inpatient use) Pink MOST form placed in chart (order not valid for inpatient use) Pink MOST form placed in chart (order not valid for inpatient use)    Tobacco Social History   Tobacco Use  Smoking Status Never Smoker  Smokeless Tobacco Never Used     Counseling given: Not  Answered   Clinical Intake:  Pre-visit preparation completed: No  Pain : No/denies pain     Nutritional Risks: None Diabetes: No  How often do you need to have someone help you when you read instructions, pamphlets, or other written materials from your doctor or pharmacy?: 2 - Rarely  Interpreter Needed?: No  Information entered by :: Tyron Russell, RN  Past Medical History:  Diagnosis Date  . Allergy   . Anxiety   . Arthritis   . Asthma   . Cataract   . Dementia   . Depression   . Difficulty walking   . Dysphagia, oral phase   . Fibromyalgia   . GERD (gastroesophageal reflux disease)   . Hallucinations   . Hyperlipidemia   . Hypertension   . Lower extremity edema   . Parkinson disease (HCC)   . Repeated falls   . Rhabdomyolysis   . Stroke (HCC)   . TIA (transient ischemic attack)   . Urinary incontinence    Past Surgical History:  Procedure Laterality Date  . ABDOMINAL HYSTERECTOMY    . CATARACT EXTRACTION    . CHOLECYSTECTOMY    . TONSILLECTOMY AND ADENOIDECTOMY     Family History  Problem Relation Age of Onset  . Non-Hodgkin's lymphoma Mother   . Stroke Mother   . Heart attack Father    Social History   Socioeconomic History  .  Marital status: Divorced    Spouse name: Not on file  . Number of children: 4  . Years of education: 28  . Highest education level: High school graduate  Occupational History  . Not on file  Social Needs  . Financial resource strain: Not hard at all  . Food insecurity:    Worry: Never true    Inability: Never true  . Transportation needs:    Medical: No    Non-medical: No  Tobacco Use  . Smoking status: Never Smoker  . Smokeless tobacco: Never Used  Substance and Sexual Activity  . Alcohol use: No  . Drug use: No  . Sexual activity: Never  Lifestyle  . Physical activity:    Days per week: 0 days    Minutes per session: 0 min  . Stress: Not at all  Relationships  . Social connections:    Talks on phone:  Three times a week    Gets together: Three times a week    Attends religious service: Never    Active member of club or organization: No    Attends meetings of clubs or organizations: Never    Relationship status: Divorced  Other Topics Concern  . Not on file  Social History Narrative   Lives at [facility]   Right-handed.   2 cups caffeine per day.    Outpatient Encounter Medications as of 05/27/2018  Medication Sig  . albuterol (PROVENTIL HFA;VENTOLIN HFA) 108 (90 Base) MCG/ACT inhaler Inhale 2 puffs into the lungs every 6 (six) hours as needed for wheezing or shortness of breath.  Marland Kitchen aluminum-magnesium hydroxide-simethicone (MAALOX) 200-200-20 MG/5ML SUSP Take 30 mLs by mouth every 6 (six) hours as needed. Constipation  . amLODipine (NORVASC) 10 MG tablet Take 10 mg by mouth daily.  Marland Kitchen aspirin EC 81 MG tablet Take 81 mg by mouth daily.  Marland Kitchen atorvastatin (LIPITOR) 10 MG tablet Take 10 mg by mouth daily.  . Baclofen 5 MG TABS Take 1 tablet by mouth every 8 (eight) hours as needed.  . busPIRone (BUSPAR) 5 MG tablet Take 5 mg by mouth 2 (two) times daily.  . calcium carbonate (TUMS - DOSED IN MG ELEMENTAL CALCIUM) 500 MG chewable tablet Chew 1 tablet by mouth every 6 (six) hours as needed for indigestion or heartburn.  . carbidopa-levodopa (SINEMET IR) 25-100 MG tablet Take 1 tablet by mouth 3 (three) times daily.   . cholecalciferol (VITAMIN D) 1000 units tablet Take 1,000 Units by mouth daily.  . diphenhydrAMINE (BENADRYL) 25 MG tablet Take 25 mg by mouth every 8 (eight) hours as needed for itching.  . donepezil (ARICEPT) 10 MG tablet Take 10 mg by mouth at bedtime.  . DULoxetine (CYMBALTA) 30 MG capsule Give 3 tablets (90mg ) by mouth daily  . fluticasone (FLONASE) 50 MCG/ACT nasal spray Place 2 sprays into both nostrils daily.  Marland Kitchen gabapentin (NEURONTIN) 100 MG capsule Take 100 mg by mouth at bedtime.  . Lidocaine 4 % PTCH Apply to lower back topically one time a day every AM and remove  every PM for pain management  . loperamide (IMODIUM) 2 MG capsule Take 2 mg by mouth every 6 (six) hours as needed for diarrhea or loose stools.  Marland Kitchen loratadine (CLARITIN) 10 MG tablet Take 10 mg by mouth daily as needed.   Marland Kitchen LORazepam (ATIVAN) 1 MG tablet Give 1 tablet by mouth every 6 hours routinely  . Melatonin 3 MG TABS Take 1 tablet by mouth at bedtime.  . meloxicam (  MOBIC) 7.5 MG tablet Take 15 mg by mouth daily.   . memantine (NAMENDA) 10 MG tablet Take 10 mg by mouth 2 (two) times daily.  . Menthol, Topical Analgesic, (BIOFREEZE) 4 % GEL Apply 1 application topically 2 (two) times daily. For posterior neck pain and bilateral knees  . ondansetron (ZOFRAN) 4 MG tablet Take 4 mg by mouth every 6 (six) hours as needed for nausea or vomiting.  . pantoprazole (PROTONIX) 40 MG tablet Take 40 mg by mouth daily.  . polyethylene glycol (MIRALAX / GLYCOLAX) packet Take 17 g by mouth daily as needed.   . traMADol (ULTRAM) 50 MG tablet Take 1 tablet (50 mg total) by mouth 4 (four) times daily.  . ziprasidone (GEODON) 20 MG capsule Take 20 mg by mouth every evening.   No facility-administered encounter medications on file as of 05/27/2018.     Activities of Daily Living In your present state of health, do you have any difficulty performing the following activities: 05/27/2018 04/15/2018  Hearing? N N  Vision? N Y  Difficulty concentrating or making decisions? Malvin Johns  Walking or climbing stairs? Y Y  Dressing or bathing? Y Y  Doing errands, shopping? Malvin Johns  Preparing Food and eating ? Y -  Using the Toilet? Y -  In the past six months, have you accidently leaked urine? Y -  Do you have problems with loss of bowel control? Y -  Managing your Medications? Y -  Managing your Finances? Y -  Housekeeping or managing your Housekeeping? Y -  Some recent data might be hidden    Patient Care Team: Kirt Boys, DO as PCP - [facility] (Internal [facility], [facility])    Assessment:   This is a routine wellness examination for [facility].  Exercise Activities and Dietary recommendations Current Exercise Habits: The patient does not participate in regular exercise at present, Exercise limited by: orthopedic condition(s)  Goals    None      Fall Risk Fall Risk  05/27/2018 05/14/2017  Falls in the past year? No Yes  Number falls in past yr: - 2 or more  Injury with Fall? - No   Is the patient's home free of loose throw rugs in walkways, pet beds, electrical cords, etc?   yes      Grab bars in the bathroom? yes      Handrails on the stairs?   yes      Adequate lighting?   yes  Depression Screen PHQ 2/9 Scores 05/27/2018 05/14/2017  PHQ - 2 Score 0 0     Cognitive Function MMSE - Mini Mental State Exam 10/15/2017  Orientation to time 5  Orientation to Place 4  Registration 3  Attention/ Calculation 5  Recall 0  Language- name 2 objects 2  Language- repeat 1  Language- follow 3 step command 3  Language- read & follow direction 1  Write a sentence 1  Copy design 1  Total score 26     6CIT Screen 05/27/2018 05/14/2017  What Year? 0 points 0 points  What month? 0 points 0 points  What time? 0 points 0 points  Count back from 20 0 points 0 points  Months in reverse 4 points 0 points  Repeat phrase 2 points 2 points  Total Score 6 2    Immunization History  Administered Date(s) Administered  . Influenza-Unspecified 03/17/2017, 09/09/2017  . PPD Test 03/24/2017  . Pneumococcal Polysaccharide-23 03/17/2017    Qualifies  for Shingles Vaccine? excluded  Screening Tests Health Maintenance  Topic Date Due  . INFLUENZA VACCINE  06/17/2018  . Hepatitis C Screening  Completed  . MAMMOGRAM  Discontinued  . DEXA SCAN  Discontinued  . COLONOSCOPY  Discontinued  . TETANUS/TDAP  Discontinued  . PNA vac Low Risk Adult  Discontinued    Cancer Screenings: Lung: Low Dose CT Chest recommended if Age 30-80 years, 30 pack-year currently  smoking OR have quit w/in 15years. Patient does not qualify. Breast:  Up to date on Mammogram? Yes , excluded Up to date of Bone Density/Dexa? Yes, excluded Colorectal: excluded  Additional Screenings:  Hepatitis C Screening: declined     Plan:     I have personally reviewed and addressed the Medicare Annual Wellness questionnaire and have noted the following in the patient's chart:  A. Medical and social history B. Use of alcohol, tobacco or illicit drugs  C. Current medications and supplements D. Functional ability and status E.  Nutritional status F.  Physical activity G. Advance directives H. List of other physicians I.  Hospitalizations, surgeries, and ER visits in previous 12 months J.  Vitals K. Screenings to include hearing, vision, cognitive, depression L. Referrals and appointments - none  In addition, I have reviewed and discussed with patient certain preventive protocols, quality metrics, and best practice recommendations. A written personalized care plan for preventive services as well as general preventive health recommendations were provided to patient.  See attached scanned questionnaire for additional information.   Signed,   Tyron RussellSara Saunders, RN Nurse Health Advisor  Patient Concerns: Explained that she is getting weaker and would like a wheelchair. She also expressed that she wanted to start back in physical therapy

## 2018-05-27 NOTE — Patient Instructions (Signed)
[name] , Thank you for taking time to come for your Medicare Wellness Visit. I appreciate your ongoing commitment to your health goals. Please review the following plan we discussed and let me know if I can assist you in the future.   Screening recommendations/referrals: Colonoscopy excluded Mammogram excluded Bone Density excluded Recommended yearly ophthalmology/optometry visit for glaucoma screening and checkup Recommended yearly dental visit for hygiene and checkup  Vaccinations: Influenza vaccine up to date, due 2019 fall season Pneumococcal vaccine excluded Tdap vaccine excluded Shingles vaccine excluded    Advanced directives: in chart  Conditions/risks identified: none  Next appointment: Dr. Montez Moritaarter makes rounds   Preventive Care 65 Years and Older, Female Preventive care refers to lifestyle choices and visits with your health care provider that can promote health and wellness. What does preventive care include?  A yearly physical exam. This is also called an annual well check.  Dental exams once or twice a year.  Routine eye exams. Ask your health care provider how often you should have your eyes checked.  Personal lifestyle choices, including:  Daily care of your teeth and gums.  Regular physical activity.  Eating a healthy diet.  Avoiding tobacco and drug use.  Limiting alcohol use.  Practicing safe sex.  Taking low-dose aspirin every day.  Taking vitamin and mineral supplements as recommended by your health care provider. What happens during an annual well check? The services and screenings done by your health care provider during your annual well check will depend on your age, overall health, lifestyle risk factors, and family history of disease. Counseling  Your health care provider may ask you questions about your:  Alcohol use.  Tobacco use.  Drug use.  Emotional well-being.  Home and relationship well-being.  Sexual  activity.  Eating habits.  History of falls.  Memory and ability to understand (cognition).  Work and work Astronomerenvironment.  Reproductive health. Screening  You may have the following tests or measurements:  Height, weight, and BMI.  Blood pressure.  Lipid and cholesterol levels. These may be checked every 5 years, or more frequently if you are over 74 years old.  Skin check.  Lung cancer screening. You may have this screening every year starting at age 74 if you have a 30-pack-year history of smoking and currently smoke or have quit within the past 15 years.  Fecal occult blood test (FOBT) of the stool. You may have this test every year starting at age 74.  Flexible sigmoidoscopy or colonoscopy. You may have a sigmoidoscopy every 5 years or a colonoscopy every 10 years starting at age 250.  Hepatitis C blood test.  Hepatitis B blood test.  Sexually transmitted disease (STD) testing.  Diabetes screening. This is done by checking your blood sugar (glucose) after you have not eaten for a while (fasting). You may have this done every 1-3 years.  Bone density scan. This is done to screen for osteoporosis. You may have this done starting at age 74.  Mammogram. This may be done every 1-2 years. Talk to your health care provider about how often you should have regular mammograms. Talk with your health care provider about your test results, treatment options, and if necessary, the need for more tests. Vaccines  Your health care provider may recommend certain vaccines, such as:  Influenza vaccine. This is recommended every year.  Tetanus, diphtheria, and acellular pertussis (Tdap, Td) vaccine. You may need a Td booster every 10 years.  Zoster vaccine. You may need this  after age 57.  Pneumococcal 13-valent conjugate (PCV13) vaccine. One dose is recommended after age 36.  Pneumococcal polysaccharide (PPSV23) vaccine. One dose is recommended after age 44. Talk to your health care  provider about which screenings and vaccines you need and how often you need them. This information is not intended to replace advice given to you by your health care provider. Make sure you discuss any questions you have with your health care provider. Document Released: 11/30/2015 Document Revised: 07/23/2016 Document Reviewed: 09/04/2015 Elsevier Interactive Patient Education  2017 [place] Prevention in the Home Falls can cause injuries. They can happen to people of all ages. There are many things you can do to make your home safe and to help prevent falls. What can I do on the outside of my home?  Regularly fix the edges of walkways and driveways and fix any cracks.  Remove anything that might make you trip as you walk through a door, such as a raised step or threshold.  Trim any bushes or trees on the path to your home.  Use bright outdoor lighting.  Clear any walking paths of anything that might make someone trip, such as rocks or tools.  Regularly check to see if handrails are loose or broken. Make sure that both sides of any steps have handrails.  Any raised decks and porches should have guardrails on the edges.  Have any leaves, snow, or ice cleared regularly.  Use sand or salt on walking paths during winter.  Clean up any spills in your garage right away. This includes oil or grease spills. What can I do in the bathroom?  Use night lights.  Install grab bars by the toilet and in the tub and shower. Do not use towel bars as grab bars.  Use non-skid mats or decals in the tub or shower.  If you need to sit down in the shower, use a plastic, non-slip stool.  Keep the floor dry. Clean up any water that spills on the floor as soon as it happens.  Remove soap buildup in the tub or shower regularly.  Attach bath mats securely with double-sided non-slip rug tape.  Do not have throw rugs and other things on the floor that can make you trip. What can I do in  the bedroom?  Use night lights.  Make sure that you have a light by your bed that is easy to reach.  Do not use any sheets or blankets that are too big for your bed. They should not hang down onto the floor.  Have a firm chair that has side arms. You can use this for support while you get dressed.  Do not have throw rugs and other things on the floor that can make you trip. What can I do in the kitchen?  Clean up any spills right away.  Avoid walking on wet floors.  Keep items that you use a lot in easy-to-reach places.  If you need to reach something above you, use a strong step stool that has a grab bar.  Keep electrical cords out of the way.  Do not use floor polish or wax that makes floors slippery. If you must use wax, use non-skid floor wax.  Do not have throw rugs and other things on the floor that can make you trip. What can I do with my stairs?  Do not leave any items on the stairs.  Make sure that there are handrails on both sides of the  stairs and use them. Fix handrails that are broken or loose. Make sure that handrails are as long as the stairways.  Check any carpeting to make sure that it is firmly attached to the stairs. Fix any carpet that is loose or worn.  Avoid having throw rugs at the top or bottom of the stairs. If you do have throw rugs, attach them to the floor with carpet tape.  Make sure that you have a light switch at the top of the stairs and the bottom of the stairs. If you do not have them, ask someone to add them for you. What else can I do to help prevent falls?  Wear shoes that:  Do not have high heels.  Have rubber bottoms.  Are comfortable and fit you well.  Are closed at the toe. Do not wear sandals.  If you use a stepladder:  Make sure that it is fully opened. Do not climb a closed stepladder.  Make sure that both sides of the stepladder are locked into place.  Ask someone to hold it for you, if possible.  Clearly mark and  make sure that you can see:  Any grab bars or handrails.  First and last steps.  Where the edge of each step is.  Use tools that help you move around (mobility aids) if they are needed. These include:  Canes.  Walkers.  Scooters.  Crutches.  Turn on the lights when you go into a dark area. Replace any light bulbs as soon as they burn out.  Set up your furniture so you have a clear path. Avoid moving your furniture around.  If any of your floors are uneven, fix them.  If there are any pets around you, be aware of where they are.  Review your medicines with your doctor. Some medicines can make you feel dizzy. This can increase your chance of falling. Ask your doctor what other things that you can do to help prevent falls. This information is not intended to replace advice given to you by your health care provider. Make sure you discuss any questions you have with your health care provider. Document Released: 08/30/2009 Document Revised: 04/10/2016 Document Reviewed: 12/08/2014 Elsevier Interactive Patient Education  2017 [employer].

## 2018-05-28 DIAGNOSIS — R269 Unspecified abnormalities of gait and mobility: Secondary | ICD-10-CM | POA: Diagnosis not present

## 2018-05-28 DIAGNOSIS — R531 Weakness: Secondary | ICD-10-CM | POA: Diagnosis not present

## 2018-05-28 DIAGNOSIS — R262 Difficulty in walking, not elsewhere classified: Secondary | ICD-10-CM | POA: Diagnosis not present

## 2018-05-28 DIAGNOSIS — M179 Osteoarthritis of knee, unspecified: Secondary | ICD-10-CM | POA: Diagnosis not present

## 2018-05-31 DIAGNOSIS — R262 Difficulty in walking, not elsewhere classified: Secondary | ICD-10-CM | POA: Diagnosis not present

## 2018-05-31 DIAGNOSIS — R269 Unspecified abnormalities of gait and mobility: Secondary | ICD-10-CM | POA: Diagnosis not present

## 2018-05-31 DIAGNOSIS — M179 Osteoarthritis of knee, unspecified: Secondary | ICD-10-CM | POA: Diagnosis not present

## 2018-05-31 DIAGNOSIS — R531 Weakness: Secondary | ICD-10-CM | POA: Diagnosis not present

## 2018-06-01 ENCOUNTER — Other Ambulatory Visit: Payer: Self-pay

## 2018-06-01 DIAGNOSIS — R262 Difficulty in walking, not elsewhere classified: Secondary | ICD-10-CM | POA: Diagnosis not present

## 2018-06-01 DIAGNOSIS — M179 Osteoarthritis of knee, unspecified: Secondary | ICD-10-CM | POA: Diagnosis not present

## 2018-06-01 DIAGNOSIS — R269 Unspecified abnormalities of gait and mobility: Secondary | ICD-10-CM | POA: Diagnosis not present

## 2018-06-01 DIAGNOSIS — R531 Weakness: Secondary | ICD-10-CM | POA: Diagnosis not present

## 2018-06-01 MED ORDER — LORAZEPAM 1 MG PO TABS: ORAL_TABLET | ORAL | 0 refills | Status: DC

## 2018-06-01 NOTE — Telephone Encounter (Signed)
Rx faxed to [facility] (P) [phone], (F) [phone] 

## 2018-06-02 DIAGNOSIS — R269 Unspecified abnormalities of gait and mobility: Secondary | ICD-10-CM | POA: Diagnosis not present

## 2018-06-02 DIAGNOSIS — R262 Difficulty in walking, not elsewhere classified: Secondary | ICD-10-CM | POA: Diagnosis not present

## 2018-06-02 DIAGNOSIS — R531 Weakness: Secondary | ICD-10-CM | POA: Diagnosis not present

## 2018-06-02 DIAGNOSIS — M179 Osteoarthritis of knee, unspecified: Secondary | ICD-10-CM | POA: Diagnosis not present

## 2018-06-03 ENCOUNTER — Ambulatory Visit
Admission: RE | Admit: 2018-06-03 | Discharge: 2018-06-03 | Disposition: A | Discharge status: Home / Self Care | Payer: Medicare Other | Source: Ambulatory Visit | Attending: Adult Health | Admitting: Adult Health

## 2018-06-03 DIAGNOSIS — R531 Weakness: Secondary | ICD-10-CM | POA: Diagnosis not present

## 2018-06-03 DIAGNOSIS — Z1231 Encounter for screening mammogram for malignant neoplasm of breast: Secondary | ICD-10-CM

## 2018-06-03 DIAGNOSIS — R262 Difficulty in walking, not elsewhere classified: Secondary | ICD-10-CM | POA: Diagnosis not present

## 2018-06-03 DIAGNOSIS — R269 Unspecified abnormalities of gait and mobility: Secondary | ICD-10-CM | POA: Diagnosis not present

## 2018-06-03 DIAGNOSIS — M179 Osteoarthritis of knee, unspecified: Secondary | ICD-10-CM | POA: Diagnosis not present

## 2018-06-04 DIAGNOSIS — R262 Difficulty in walking, not elsewhere classified: Secondary | ICD-10-CM | POA: Diagnosis not present

## 2018-06-04 DIAGNOSIS — M179 Osteoarthritis of knee, unspecified: Secondary | ICD-10-CM | POA: Diagnosis not present

## 2018-06-04 DIAGNOSIS — R269 Unspecified abnormalities of gait and mobility: Secondary | ICD-10-CM | POA: Diagnosis not present

## 2018-06-04 DIAGNOSIS — R531 Weakness: Secondary | ICD-10-CM | POA: Diagnosis not present

## 2018-06-07 DIAGNOSIS — R269 Unspecified abnormalities of gait and mobility: Secondary | ICD-10-CM | POA: Diagnosis not present

## 2018-06-07 DIAGNOSIS — R531 Weakness: Secondary | ICD-10-CM | POA: Diagnosis not present

## 2018-06-07 DIAGNOSIS — M179 Osteoarthritis of knee, unspecified: Secondary | ICD-10-CM | POA: Diagnosis not present

## 2018-06-07 DIAGNOSIS — R262 Difficulty in walking, not elsewhere classified: Secondary | ICD-10-CM | POA: Diagnosis not present

## 2018-06-08 DIAGNOSIS — M179 Osteoarthritis of knee, unspecified: Secondary | ICD-10-CM | POA: Diagnosis not present

## 2018-06-08 DIAGNOSIS — R269 Unspecified abnormalities of gait and mobility: Secondary | ICD-10-CM | POA: Diagnosis not present

## 2018-06-08 DIAGNOSIS — R262 Difficulty in walking, not elsewhere classified: Secondary | ICD-10-CM | POA: Diagnosis not present

## 2018-06-08 DIAGNOSIS — R531 Weakness: Secondary | ICD-10-CM | POA: Diagnosis not present

## 2018-06-09 ENCOUNTER — Non-Acute Institutional Stay (SKILLED_NURSING_FACILITY): Payer: Medicare Other | Admitting: Adult Health

## 2018-06-09 ENCOUNTER — Encounter: Payer: Self-pay | Admitting: Adult Health

## 2018-06-09 DIAGNOSIS — K219 Gastro-esophageal reflux disease without esophagitis: Secondary | ICD-10-CM | POA: Diagnosis not present

## 2018-06-09 DIAGNOSIS — R269 Unspecified abnormalities of gait and mobility: Secondary | ICD-10-CM | POA: Diagnosis not present

## 2018-06-09 DIAGNOSIS — M179 Osteoarthritis of knee, unspecified: Secondary | ICD-10-CM | POA: Diagnosis not present

## 2018-06-09 DIAGNOSIS — R1311 Dysphagia, oral phase: Secondary | ICD-10-CM

## 2018-06-09 DIAGNOSIS — E785 Hyperlipidemia, unspecified: Secondary | ICD-10-CM

## 2018-06-09 DIAGNOSIS — R262 Difficulty in walking, not elsewhere classified: Secondary | ICD-10-CM | POA: Diagnosis not present

## 2018-06-09 DIAGNOSIS — J45909 Unspecified asthma, uncomplicated: Secondary | ICD-10-CM | POA: Diagnosis not present

## 2018-06-09 DIAGNOSIS — R531 Weakness: Secondary | ICD-10-CM | POA: Diagnosis not present

## 2018-06-09 NOTE — Progress Notes (Signed)
Location:   [facility] Room Number: 111 B Place of Service:  SNF (31)   CODE STATUS: Full Code (Most form updated 03/17/17)  Allergies  Allergen Reactions  . Hydrocodone Other (See Comments)    GI upset    Chief Complaint  Patient presents with  . Medical Management of Chronic Issues    Asthma; gerd; dysphagia; dyslipidemia     HPI:  She is a 72 year old long term resident of this facility being seen for the management of her chronic illnesses: asthma; gerd; dysphagia; dyslipidemia. She denies insomnia; cough of shortness of breath. She does have chronic anxiety. There are no nursing concerns at this time.   Past Medical History:  Diagnosis Date  . Allergy   . Anxiety   . Arthritis   . Asthma   . Cataract   . Dementia   . Depression   . Difficulty walking   . Dysphagia, oral phase   . Fibromyalgia   . GERD (gastroesophageal reflux disease)   . Hallucinations   . Hyperlipidemia   . Hypertension   . Lower extremity edema   . Parkinson disease (HCC)   . Repeated falls   . Rhabdomyolysis   . Stroke (HCC)   . TIA (transient ischemic attack)   . Urinary incontinence     Past Surgical History:  Procedure Laterality Date  . ABDOMINAL HYSTERECTOMY    . CATARACT EXTRACTION    . CHOLECYSTECTOMY    . TONSILLECTOMY AND ADENOIDECTOMY      Social History   Socioeconomic History  . Marital status: Divorced    Spouse name: Not on file  . Number of children: 4  . Years of education: 54  . Highest education level: High school graduate  Occupational History  . Not on file  Social Needs  . Financial resource strain: Not hard at all  . Food insecurity:    Worry: Never true    Inability: Never true  . Transportation needs:    Medical: No    Non-medical: No  Tobacco Use  . Smoking status: Never Smoker  . Smokeless tobacco: Never Used  Substance and Sexual Activity  . Alcohol use: No  . Drug use: No  . Sexual activity: Never  Lifestyle  .  Physical activity:    Days per week: 0 days    Minutes per session: 0 min  . Stress: Not at all  Relationships  . Social connections:    Talks on phone: Three times a week    Gets together: Three times a week    Attends religious service: Never    Active member of club or organization: No    Attends meetings of clubs or organizations: Never    Relationship status: Divorced  . Intimate partner violence:    Fear of current or ex partner: No    Emotionally abused: No    Physically abused: No    Forced sexual activity: No  Other Topics Concern  . Not on file  Social History Narrative   Lives at [facility]   Right-handed.   2 cups caffeine per day.   Family History  Problem Relation Age of Onset  . Non-Hodgkin's lymphoma Mother   . Stroke Mother   . Heart attack Father       VITAL SIGNS BP (!) 141/71   Pulse 84   Temp (!) 97.5 F (36.4 C)   Resp 20   Ht 5\' 5"  (1.651 m)   Wt  170 lb 8 oz (77.3 kg)   SpO2 97%   BMI 28.37 kg/m   Outpatient Encounter Medications as of 06/09/2018  Medication Sig  . albuterol (PROVENTIL HFA;VENTOLIN HFA) 108 (90 Base) MCG/ACT inhaler Inhale 2 puffs into the lungs every 6 (six) hours as needed for wheezing or shortness of breath.  Marland Kitchen. aluminum-magnesium hydroxide-simethicone (MAALOX) 200-200-20 MG/5ML SUSP Take 30 mLs by mouth every 6 (six) hours as needed. Constipation  . amLODipine (NORVASC) 10 MG tablet Take 10 mg by mouth daily.  Marland Kitchen. aspirin EC 81 MG tablet Take 81 mg by mouth daily.  Marland Kitchen. atorvastatin (LIPITOR) 10 MG tablet Take 10 mg by mouth daily.  . busPIRone (BUSPAR) 5 MG tablet Take 5 mg by mouth 2 (two) times daily.  . calcium carbonate (TUMS - DOSED IN MG ELEMENTAL CALCIUM) 500 MG chewable tablet Chew 1 tablet by mouth every 6 (six) hours as needed for indigestion or heartburn.  . carbidopa-levodopa (SINEMET IR) 25-100 MG tablet Take 1 tablet by mouth 3 (three) times daily.   . cholecalciferol (VITAMIN D) 1000 units tablet Take  1,000 Units by mouth daily.  . diphenhydrAMINE (BENADRYL) 25 MG tablet Take 25 mg by mouth every 8 (eight) hours as needed for itching.  . donepezil (ARICEPT) 10 MG tablet Take 10 mg by mouth at bedtime.  . DULoxetine (CYMBALTA) 30 MG capsule Give 3 tablets (90mg ) by mouth daily  . fluticasone (FLONASE) 50 MCG/ACT nasal spray Place 2 sprays into both nostrils daily.  Marland Kitchen. gabapentin (NEURONTIN) 100 MG capsule Take 100 mg by mouth at bedtime.  . Lidocaine 4 % PTCH Apply to lower back topically one time a day every AM and remove every PM for pain management  . loperamide (IMODIUM) 2 MG capsule Take 2 mg by mouth every 6 (six) hours as needed for diarrhea or loose stools.  Marland Kitchen. loratadine (CLARITIN) 10 MG tablet Take 10 mg by mouth daily as needed.   Marland Kitchen. LORazepam (ATIVAN) 1 MG tablet Give 1 tablet by mouth every 6 hours routinely  . Melatonin 3 MG TABS Take 1 tablet by mouth at bedtime.   . meloxicam (MOBIC) 7.5 MG tablet Take 15 mg by mouth daily.   . memantine (NAMENDA) 10 MG tablet Take 10 mg by mouth 2 (two) times daily.  . Menthol, Topical Analgesic, (BIOFREEZE) 4 % GEL Apply 1 application topically 2 (two) times daily. For posterior neck pain and bilateral knees  . methocarbamol (ROBAXIN) 500 MG tablet Take 500 mg by mouth at bedtime.  . ondansetron (ZOFRAN) 4 MG tablet Take 4 mg by mouth every 6 (six) hours as needed for nausea or vomiting.  . pantoprazole (PROTONIX) 40 MG tablet Take 40 mg by mouth daily.  . polyethylene glycol (MIRALAX / GLYCOLAX) packet Take 17 g by mouth daily as needed.   . traMADol (ULTRAM) 50 MG tablet Take 1 tablet (50 mg total) by mouth 4 (four) times daily.  . ziprasidone (GEODON) 20 MG capsule Take 20 mg by mouth every evening.  . [DISCONTINUED] Baclofen 5 MG TABS Take 1 tablet by mouth every 8 (eight) hours as needed.   No facility-administered encounter medications on file as of 06/09/2018.      SIGNIFICANT DIAGNOSTIC EXAMS  NO NEW EXAMS  LABS REVIEWED:PREVIOUS    05-19-17: Vit D 33.37 06-19-17: glucose 122; bun 17.3; creat 0.71; k+ 4.0; na++ 141; ca 9.4; liver normal 4.8 11-03-17: wbc 9.5; hgb 12.0; hct 34.6; mcv 90.7; plt 214; glucose 105; bun 16.1; creat 0.59 ;  k+ 3.6; na++ 142; ca 8.9; liver normal albumin 4.2 03-15-18: wbc 6.4; hgb 12.9; hct 37.3; mcv 88.;5 plt 270; glucose 97; bun 12.6; creat 0.71; k+ 3.4; na++ 144; ca 8.9; liver normal albumin 4.0; chol 139; ldl 74; trig 97; hdl 46; hgb a1c 5.4    NO NEW LABS.    Review of Systems  Constitutional: Negative for malaise/fatigue.  Respiratory: Negative for cough and shortness of breath.   Cardiovascular: Negative for chest pain, palpitations and leg swelling.  Gastrointestinal: Negative for abdominal pain, constipation and heartburn.  Musculoskeletal: Negative for back pain, joint pain and myalgias.  Skin: Negative.   Neurological: Negative for dizziness.  Psychiatric/Behavioral: The patient is nervous/anxious.     Physical Exam  Constitutional: She is oriented to person, place, and time. She appears well-developed and well-nourished. No distress.  Neck: No thyromegaly present.  Cardiovascular: Normal rate, regular rhythm and intact distal pulses.  Murmur heard. 1/6  Pulmonary/Chest: Effort normal and breath sounds normal. No respiratory distress.  Abdominal: Soft. Bowel sounds are normal. She exhibits no distension. There is no tenderness.  Musculoskeletal: Normal range of motion. She exhibits no edema.  Lymphadenopathy:    She has no cervical adenopathy.  Neurological: She is alert and oriented to person, place, and time.  Slight bilateral hand tremor   Skin: Skin is warm and dry. She is not diaphoretic.  Psychiatric: She has a normal mood and affect.      ASSESSMENT/ PLAN:  TODAY:   1. Dyslipidemia: stable  ldl 74: will continue lipitor 10 mg daily   2.  Asthma with allergic rhinitis without complication:without change  will continue flonase daily; claritin 10 mg daily as  needed and albuterol 2 puffs every 6 hours as needed  3. Gerd without esophagitis: stable  will continue protonix 40 mg daily   4. Dysphagia oral phase: no signs of aspiration present is on thin liquids ; will continue current plan of care   PREVIOUS   5. Vascular dementia without behavioral disturbance: stable her weight is 170 pounds; will continue namenda 10 mg twice daily and aricept 10 mg daily  will continue to monitor her status.  6. Recurrent major depression with psychotic features: is worsewill continue cymbalta 90 mg daily  geodon 20 mg nightly ativan 1 mg every 6 hours and will monitor her status   7. Parkinsonism: without change will continue sinemet IR 25/100 mg three times daily   8. Chronic bilateral lower back pain without sciatica: stable ; will continue ultram 50 mg four times daily; mobic 15 mg daily  lidoderm patch to back; biofreeze to neck and knees   9. Primary osteoarthritis involving multiple joints: stable will continue mobic 15 mg daily biofreeze twice daily to posterior neck  ultram 50 mg four times daily   10. Slow transit constipation: stable will continue  miralax 17 gm daily as needed    11. Benign essential hypertension: stable b/p 141/71 will continue norvasc 10 mg daily      MD is aware of resident's narcotic use and is in agreement with current plan of care. We will attempt to wean resident as apropriate   Synthia Innocent NP [facility]  Contact [phone] Monday through Friday 8am- 5pm  After hours call [phone]

## 2018-06-14 DIAGNOSIS — M179 Osteoarthritis of knee, unspecified: Secondary | ICD-10-CM | POA: Diagnosis not present

## 2018-06-14 DIAGNOSIS — R531 Weakness: Secondary | ICD-10-CM | POA: Diagnosis not present

## 2018-06-14 DIAGNOSIS — R262 Difficulty in walking, not elsewhere classified: Secondary | ICD-10-CM | POA: Diagnosis not present

## 2018-06-14 DIAGNOSIS — R269 Unspecified abnormalities of gait and mobility: Secondary | ICD-10-CM | POA: Diagnosis not present

## 2018-06-15 DIAGNOSIS — R531 Weakness: Secondary | ICD-10-CM | POA: Diagnosis not present

## 2018-06-15 DIAGNOSIS — R269 Unspecified abnormalities of gait and mobility: Secondary | ICD-10-CM | POA: Diagnosis not present

## 2018-06-15 DIAGNOSIS — R262 Difficulty in walking, not elsewhere classified: Secondary | ICD-10-CM | POA: Diagnosis not present

## 2018-06-15 DIAGNOSIS — M179 Osteoarthritis of knee, unspecified: Secondary | ICD-10-CM | POA: Diagnosis not present

## 2018-06-16 ENCOUNTER — Other Ambulatory Visit: Payer: Self-pay

## 2018-06-16 DIAGNOSIS — R531 Weakness: Secondary | ICD-10-CM | POA: Diagnosis not present

## 2018-06-16 DIAGNOSIS — R262 Difficulty in walking, not elsewhere classified: Secondary | ICD-10-CM | POA: Diagnosis not present

## 2018-06-16 DIAGNOSIS — R269 Unspecified abnormalities of gait and mobility: Secondary | ICD-10-CM | POA: Diagnosis not present

## 2018-06-16 DIAGNOSIS — M179 Osteoarthritis of knee, unspecified: Secondary | ICD-10-CM | POA: Diagnosis not present

## 2018-06-16 MED ORDER — TRAMADOL HCL 50 MG PO TABS: 50.0000 mg | ORAL_TABLET | Freq: Four times a day (QID) | ORAL | 0 refills | Status: DC

## 2018-06-16 NOTE — Telephone Encounter (Signed)
Rx faxed to [facility] (P) [phone], (F) [phone] 

## 2018-06-17 ENCOUNTER — Telehealth: Payer: Self-pay | Admitting: *Deleted

## 2018-06-17 ENCOUNTER — Ambulatory Visit: Payer: Medicare Other | Admitting: Neurology

## 2018-06-17 DIAGNOSIS — F0281 Dementia in other diseases classified elsewhere with behavioral disturbance: Secondary | ICD-10-CM | POA: Diagnosis not present

## 2018-06-17 DIAGNOSIS — R531 Weakness: Secondary | ICD-10-CM | POA: Diagnosis not present

## 2018-06-17 DIAGNOSIS — F331 Major depressive disorder, recurrent, moderate: Secondary | ICD-10-CM | POA: Diagnosis not present

## 2018-06-17 DIAGNOSIS — F064 Anxiety disorder due to known physiological condition: Secondary | ICD-10-CM | POA: Diagnosis not present

## 2018-06-17 DIAGNOSIS — R269 Unspecified abnormalities of gait and mobility: Secondary | ICD-10-CM | POA: Diagnosis not present

## 2018-06-17 DIAGNOSIS — G3183 Dementia with Lewy bodies: Secondary | ICD-10-CM | POA: Diagnosis not present

## 2018-06-17 DIAGNOSIS — M179 Osteoarthritis of knee, unspecified: Secondary | ICD-10-CM | POA: Diagnosis not present

## 2018-06-17 DIAGNOSIS — F5105 Insomnia due to other mental disorder: Secondary | ICD-10-CM | POA: Diagnosis not present

## 2018-06-17 DIAGNOSIS — R262 Difficulty in walking, not elsewhere classified: Secondary | ICD-10-CM | POA: Diagnosis not present

## 2018-06-17 NOTE — Telephone Encounter (Signed)
No showed follow up appointment. 

## 2018-06-21 ENCOUNTER — Encounter: Payer: Self-pay | Admitting: Neurology

## 2018-06-22 DIAGNOSIS — R269 Unspecified abnormalities of gait and mobility: Secondary | ICD-10-CM | POA: Diagnosis not present

## 2018-06-22 DIAGNOSIS — M179 Osteoarthritis of knee, unspecified: Secondary | ICD-10-CM | POA: Diagnosis not present

## 2018-06-22 DIAGNOSIS — R531 Weakness: Secondary | ICD-10-CM | POA: Diagnosis not present

## 2018-06-22 DIAGNOSIS — R262 Difficulty in walking, not elsewhere classified: Secondary | ICD-10-CM | POA: Diagnosis not present

## 2018-06-23 DIAGNOSIS — R269 Unspecified abnormalities of gait and mobility: Secondary | ICD-10-CM | POA: Diagnosis not present

## 2018-06-23 DIAGNOSIS — R531 Weakness: Secondary | ICD-10-CM | POA: Diagnosis not present

## 2018-06-23 DIAGNOSIS — R262 Difficulty in walking, not elsewhere classified: Secondary | ICD-10-CM | POA: Diagnosis not present

## 2018-06-23 DIAGNOSIS — M179 Osteoarthritis of knee, unspecified: Secondary | ICD-10-CM | POA: Diagnosis not present

## 2018-06-24 DIAGNOSIS — G3183 Dementia with Lewy bodies: Secondary | ICD-10-CM | POA: Diagnosis not present

## 2018-06-24 DIAGNOSIS — F064 Anxiety disorder due to known physiological condition: Secondary | ICD-10-CM | POA: Diagnosis not present

## 2018-06-24 DIAGNOSIS — F5105 Insomnia due to other mental disorder: Secondary | ICD-10-CM | POA: Diagnosis not present

## 2018-06-24 DIAGNOSIS — F0281 Dementia in other diseases classified elsewhere with behavioral disturbance: Secondary | ICD-10-CM | POA: Diagnosis not present

## 2018-06-24 DIAGNOSIS — F331 Major depressive disorder, recurrent, moderate: Secondary | ICD-10-CM | POA: Diagnosis not present

## 2018-06-29 ENCOUNTER — Other Ambulatory Visit: Payer: Self-pay

## 2018-06-29 MED ORDER — LORAZEPAM 1 MG PO TABS: ORAL_TABLET | ORAL | 0 refills | Status: DC

## 2018-06-29 NOTE — Telephone Encounter (Signed)
Rx faxed to [facility] (P) [phone], (F) [phone] 

## 2018-07-01 DIAGNOSIS — G3183 Dementia with Lewy bodies: Secondary | ICD-10-CM | POA: Diagnosis not present

## 2018-07-01 DIAGNOSIS — F331 Major depressive disorder, recurrent, moderate: Secondary | ICD-10-CM | POA: Diagnosis not present

## 2018-07-01 DIAGNOSIS — F064 Anxiety disorder due to known physiological condition: Secondary | ICD-10-CM | POA: Diagnosis not present

## 2018-07-01 DIAGNOSIS — F5105 Insomnia due to other mental disorder: Secondary | ICD-10-CM | POA: Diagnosis not present

## 2018-07-01 DIAGNOSIS — F250 Schizoaffective disorder, bipolar type: Secondary | ICD-10-CM | POA: Diagnosis not present

## 2018-07-01 DIAGNOSIS — F0281 Dementia in other diseases classified elsewhere with behavioral disturbance: Secondary | ICD-10-CM | POA: Diagnosis not present

## 2018-07-02 DIAGNOSIS — D649 Anemia, unspecified: Secondary | ICD-10-CM | POA: Diagnosis not present

## 2018-07-02 DIAGNOSIS — E039 Hypothyroidism, unspecified: Secondary | ICD-10-CM | POA: Diagnosis not present

## 2018-07-02 DIAGNOSIS — R319 Hematuria, unspecified: Secondary | ICD-10-CM | POA: Diagnosis not present

## 2018-07-02 DIAGNOSIS — N390 Urinary tract infection, site not specified: Secondary | ICD-10-CM | POA: Diagnosis not present

## 2018-07-02 DIAGNOSIS — Z79899 Other long term (current) drug therapy: Secondary | ICD-10-CM | POA: Diagnosis not present

## 2018-07-02 LAB — CBC AND DIFFERENTIAL
HCT: 36 (ref 36–46)
Hemoglobin: 12.8 (ref 12.0–16.0)
Neutrophils Absolute: 3
PLATELETS: 260 (ref 150–399)
WBC: 5.2

## 2018-07-02 LAB — HEPATIC FUNCTION PANEL
ALK PHOS: 114 (ref 25–125)
ALT: 5 — AB (ref 7–35)
AST: 10 — AB (ref 13–35)
Bilirubin, Total: 0.7

## 2018-07-02 LAB — BASIC METABOLIC PANEL
BUN: 16 (ref 4–21)
CREATININE: 0.8 (ref 0.5–1.1)
Glucose: 105
Potassium: 3.6 (ref 3.4–5.3)
Sodium: 145 (ref 137–147)

## 2018-07-02 LAB — TSH: TSH: 2.86 (ref 0.41–5.90)

## 2018-07-07 ENCOUNTER — Encounter: Payer: Self-pay | Admitting: Internal Medicine

## 2018-07-08 DIAGNOSIS — F0281 Dementia in other diseases classified elsewhere with behavioral disturbance: Secondary | ICD-10-CM | POA: Diagnosis not present

## 2018-07-08 DIAGNOSIS — F331 Major depressive disorder, recurrent, moderate: Secondary | ICD-10-CM | POA: Diagnosis not present

## 2018-07-08 DIAGNOSIS — F5105 Insomnia due to other mental disorder: Secondary | ICD-10-CM | POA: Diagnosis not present

## 2018-07-08 DIAGNOSIS — G3183 Dementia with Lewy bodies: Secondary | ICD-10-CM | POA: Diagnosis not present

## 2018-07-08 DIAGNOSIS — F250 Schizoaffective disorder, bipolar type: Secondary | ICD-10-CM | POA: Diagnosis not present

## 2018-07-08 DIAGNOSIS — F064 Anxiety disorder due to known physiological condition: Secondary | ICD-10-CM | POA: Diagnosis not present

## 2018-07-15 ENCOUNTER — Non-Acute Institutional Stay (SKILLED_NURSING_FACILITY): Payer: Medicare Other | Admitting: Adult Health

## 2018-07-15 ENCOUNTER — Encounter: Payer: Self-pay | Admitting: Adult Health

## 2018-07-15 DIAGNOSIS — F0150 Vascular dementia without behavioral disturbance: Secondary | ICD-10-CM

## 2018-07-15 DIAGNOSIS — F0281 Dementia in other diseases classified elsewhere with behavioral disturbance: Secondary | ICD-10-CM | POA: Diagnosis not present

## 2018-07-15 DIAGNOSIS — F333 Major depressive disorder, recurrent, severe with psychotic symptoms: Secondary | ICD-10-CM | POA: Diagnosis not present

## 2018-07-15 DIAGNOSIS — G8929 Other chronic pain: Secondary | ICD-10-CM | POA: Diagnosis not present

## 2018-07-15 DIAGNOSIS — M545 Low back pain: Secondary | ICD-10-CM

## 2018-07-15 DIAGNOSIS — F250 Schizoaffective disorder, bipolar type: Secondary | ICD-10-CM | POA: Diagnosis not present

## 2018-07-15 DIAGNOSIS — G20 Parkinson's disease: Secondary | ICD-10-CM

## 2018-07-15 DIAGNOSIS — F064 Anxiety disorder due to known physiological condition: Secondary | ICD-10-CM | POA: Diagnosis not present

## 2018-07-15 DIAGNOSIS — G3183 Dementia with Lewy bodies: Secondary | ICD-10-CM | POA: Diagnosis not present

## 2018-07-15 DIAGNOSIS — F331 Major depressive disorder, recurrent, moderate: Secondary | ICD-10-CM | POA: Diagnosis not present

## 2018-07-15 DIAGNOSIS — F5105 Insomnia due to other mental disorder: Secondary | ICD-10-CM | POA: Diagnosis not present

## 2018-07-15 NOTE — Progress Notes (Signed)
Location:   [facility] Room Number: 111 B Place of Service:  SNF (31)   CODE STATUS: Full Code  Allergies  Allergen Reactions  . Hydrocodone Other (See Comments)    GI upset    Chief Complaint  Patient presents with  . Medical Management of Chronic Issues    Depression; back pain; dementia; parkinson     HPI:  She is a 72 year old long term resident of this facility being seen for the management of her chronic illnesses: depression; back pain; dementia; parkinson disease. She is wanting to participate in therapy to help with her back pain and adl's. She denies any any anxiety; no change in appetite. There are no nursing concerns at this time.   Past Medical History:  Diagnosis Date  . Allergy   . Anxiety   . Arthritis   . Asthma   . Cataract   . Dementia   . Depression   . Difficulty walking   . Dysphagia, oral phase   . Fibromyalgia   . GERD (gastroesophageal reflux disease)   . Hallucinations   . Hyperlipidemia   . Hypertension   . Lower extremity edema   . Parkinson disease (HCC)   . Repeated falls   . Rhabdomyolysis   . Stroke (HCC)   . TIA (transient ischemic attack)   . Urinary incontinence     Past Surgical History:  Procedure Laterality Date  . ABDOMINAL HYSTERECTOMY    . CATARACT EXTRACTION    . CHOLECYSTECTOMY    . TONSILLECTOMY AND ADENOIDECTOMY      Social History   Socioeconomic History  . Marital status: Divorced    Spouse name: Not on file  . Number of children: 4  . Years of education: 54  . Highest education level: High school graduate  Occupational History  . Not on file  Social Needs  . Financial resource strain: Not hard at all  . Food insecurity:    Worry: Never true    Inability: Never true  . Transportation needs:    Medical: No    Non-medical: No  Tobacco Use  . Smoking status: Never Smoker  . Smokeless tobacco: Never Used  Substance and Sexual Activity  . Alcohol use: No  . Drug use: No  .  Sexual activity: Never  Lifestyle  . Physical activity:    Days per week: 0 days    Minutes per session: 0 min  . Stress: Not at all  Relationships  . Social connections:    Talks on phone: Three times a week    Gets together: Three times a week    Attends religious service: Never    Active member of club or organization: No    Attends meetings of clubs or organizations: Never    Relationship status: Divorced  . Intimate partner violence:    Fear of current or ex partner: No    Emotionally abused: No    Physically abused: No    Forced sexual activity: No  Other Topics Concern  . Not on file  Social History Narrative   L[facility] at [facility]   Right-handed.   2 cups caffeine per day.   Family History  Problem Relation Age of Onset  . Non-Hodgkin's lymphoma Mother   . Stroke Mother   . Heart attack Father       VITAL SIGNS BP 130/80   Pulse 86   Temp 97.6 F (36.4 C)   Resp 18  Ht 5\' 5"  (1.651 m)   Wt 171 lb 9.6 oz (77.8 kg)   SpO2 96%   BMI 28.56 kg/m   Outpatient Encounter Medications as of 07/15/2018  Medication Sig  . albuterol (PROVENTIL HFA;VENTOLIN HFA) 108 (90 Base) MCG/ACT inhaler Inhale 2 puffs into the lungs every 6 (six) hours as needed for wheezing or shortness of breath.  Marland Kitchen. aluminum-magnesium hydroxide-simethicone (MAALOX) 200-200-20 MG/5ML SUSP Take 30 mLs by mouth every 6 (six) hours as needed. Constipation  . amLODipine (NORVASC) 10 MG tablet Take 10 mg by mouth daily.  Marland Kitchen. aspirin EC 81 MG tablet Take 81 mg by mouth daily.  Marland Kitchen. atorvastatin (LIPITOR) 10 MG tablet Take 10 mg by mouth daily.  . busPIRone (BUSPAR) 5 MG tablet Take 5 mg by mouth 2 (two) times daily.  . calcium carbonate (TUMS - DOSED IN MG ELEMENTAL CALCIUM) 500 MG chewable tablet Chew 1 tablet by mouth every 6 (six) hours as needed for indigestion or heartburn.  . carbidopa-levodopa (SINEMET IR) 25-100 MG tablet Take 1 tablet by mouth 3 (three) times daily.   . cholecalciferol  (VITAMIN D) 1000 units tablet Take 1,000 Units by mouth daily.  . diphenhydrAMINE (BENADRYL) 25 MG tablet Take 25 mg by mouth every 8 (eight) hours as needed for itching.  . donepezil (ARICEPT) 10 MG tablet Take 10 mg by mouth at bedtime.  . DULoxetine (CYMBALTA) 30 MG capsule Give 3 tablets (90mg ) by mouth daily  . fluticasone (FLONASE) 50 MCG/ACT nasal spray Place 2 sprays into both nostrils daily.  Marland Kitchen. gabapentin (NEURONTIN) 100 MG capsule Take 100 mg by mouth at bedtime.  . Lidocaine 4 % PTCH Apply to lower back topically one time a day every AM and remove every PM for pain management  . lithium carbonate 150 MG capsule Take 150 mg by mouth at bedtime.  Marland Kitchen. loperamide (IMODIUM) 2 MG capsule Take 2 mg by mouth every 6 (six) hours as needed for diarrhea or loose stools.  Marland Kitchen. loratadine (CLARITIN) 10 MG tablet Take 10 mg by mouth daily as needed.   Marland Kitchen. LORazepam (ATIVAN) 1 MG tablet Give 1 tablet by mouth every 6 hours routinely  . Melatonin 3 MG TABS Take 1 tablet by mouth at bedtime.   . meloxicam (MOBIC) 15 MG tablet Take 15 mg by mouth daily.   . memantine (NAMENDA) 10 MG tablet Take 10 mg by mouth 2 (two) times daily.  . Menthol, Topical Analgesic, (BIOFREEZE) 4 % GEL Apply 1 application topically 2 (two) times daily. For posterior neck pain and bilateral knees  . methocarbamol (ROBAXIN) 500 MG tablet Take 500 mg by mouth at bedtime.  . Nutritional Supplements (NUTRITIONAL SUPPLEMENT PO) Regular Diet - Regular Texture  . OLANZapine (ZYPREXA) 2.5 MG tablet Take 2.5 mg by mouth daily.  . ondansetron (ZOFRAN) 4 MG tablet Take 4 mg by mouth every 6 (six) hours as needed for nausea or vomiting.  . pantoprazole (PROTONIX) 40 MG tablet Take 40 mg by mouth daily.  . polyethylene glycol (MIRALAX / GLYCOLAX) packet Take 17 g by mouth daily as needed.   . traMADol (ULTRAM) 50 MG tablet Take 1 tablet (50 mg total) by mouth 4 (four) times daily.  . ziprasidone (GEODON) 20 MG capsule Take 20 mg by mouth every  evening.   No facility-administered encounter medications on file as of 07/15/2018.      SIGNIFICANT DIAGNOSTIC EXAMS  NO NEW EXAMS  LABS REVIEWED:PREVIOUS   06-19-17: glucose 122; bun 17.3; creat 0.71; k+  4.0; na++ 141; ca 9.4; liver normal 4.8 11-03-17: wbc 9.5; hgb 12.0; hct 34.6; mcv 90.7; plt 214; glucose 105; bun 16.1; creat 0.59 ;k+ 3.6; na++ 142; ca 8.9; liver normal albumin 4.2 03-15-18: wbc 6.4; hgb 12.9; hct 37.3; mcv 88.;5 plt 270; glucose 97; bun 12.6; creat 0.71; k+ 3.4; na++ 144; ca 8.9; liver normal albumin 4.0; chol 139; ldl 74; trig 97; hdl 46; hgb a1c 5.4    TODAY:   07-02-18: wbc 5.2; hgb 12.8; hct 36.4; mcv 98.4; plt 260; glucose 105; bun 15.9; creat 0.84; k+ 3.6; na++ 145; ca 9.8; liver albumin 4.3; urine culture: <10,000; lithium <0.10     Review of Systems  Constitutional: Negative for malaise/fatigue.  Respiratory: Negative for cough and shortness of breath.   Cardiovascular: Negative for chest pain, palpitations and leg swelling.  Gastrointestinal: Negative for abdominal pain, constipation and heartburn.  Musculoskeletal: Negative for back pain, joint pain and myalgias.  Skin: Negative.   Neurological: Negative for dizziness.  Psychiatric/Behavioral: The patient is not nervous/anxious.     Physical Exam  Constitutional: She is oriented to person, place, and time. She appears well-developed and well-nourished. No distress.  Neck: No thyromegaly present.  Cardiovascular: Normal rate, regular rhythm and intact distal pulses.  Murmur heard. 1/6 a  Pulmonary/Chest: Effort normal and breath sounds normal. No respiratory distress.  Abdominal: Soft. Bowel sounds are normal. She exhibits no distension. There is no tenderness.  Musculoskeletal: Normal range of motion. She exhibits no edema.  Slight bilateral hand tremor    Lymphadenopathy:    She has no cervical adenopathy.  Neurological: She is alert and oriented to person, place, and time.  Skin: Skin is warm  and dry. She is not diaphoretic.  Psychiatric: She has a normal mood and affect.     ASSESSMENT/ PLAN:  TODAY:   1. Vascular dementia without behavioral disturbance: stable her weight is 171 pounds; will continue namenda 10 mg twice daily and aricept 10 mg daily  will continue to monitor her status.  2. Severe episode of recurrent major depressive disorder with  is worsewill continue cymbalta 90 mg daily  geodon 20 mg nightly ativan 1 mg every 6 hours will continue lithium 150 mg daily psych NP saw her and will stop zyprexa  3. Parkinsonism: without change will continue sinemet IR 25/100 mg three times daily   4. Chronic bilateral lower back pain without sciatica: worse ; will continue ultram 50 mg four times daily; mobic 15 mg daily  lidoderm patch to back robaxin 500 mg nightly ; biofreeze to neck and knees   Will have OT evaluate and treat as indicated for back pain.   PREVIOUS   5. Primary osteoarthritis involving multiple joints: stable will continue mobic 15 mg daily biofreeze twice daily to posterior neck  ultram 50 mg four times daily   6. Slow transit constipation: stable will continue  miralax 17 gm daily as needed    7. Benign essential hypertension: stable b/p 130/80 will continue norvasc 10 mg daily   8. Dyslipidemia: stable  ldl 74: will continue lipitor 10 mg daily   9.  Asthma with allergic rhinitis without complication:without change  will continue flonase daily; claritin 10 mg daily as needed and albuterol 2 puffs every 6 hours as needed  10. Gerd without esophagitis: stable  will continue protonix 40 mg daily   11. Dysphagia oral phase: no signs of aspiration present is on thin liquids ; will continue current plan of care  MD is aware of resident's narcotic use and is in agreement with current plan of care. We will attempt to wean resident as apropriate   Ok Edwards NP [facility]  Contact [phone] Monday through Friday 8am- 5pm  After  hours call [phone]

## 2018-07-16 ENCOUNTER — Other Ambulatory Visit: Payer: Self-pay

## 2018-07-16 MED ORDER — TRAMADOL HCL 50 MG PO TABS: 50.0000 mg | ORAL_TABLET | Freq: Four times a day (QID) | ORAL | 0 refills | Status: DC

## 2018-07-16 NOTE — Telephone Encounter (Signed)
Rx faxed to [facility] (P) [phone], (F) [phone] 

## 2018-07-19 ENCOUNTER — Non-Acute Institutional Stay (SKILLED_NURSING_FACILITY): Payer: Medicare Other | Admitting: Internal Medicine

## 2018-07-19 ENCOUNTER — Encounter: Payer: Self-pay | Admitting: Internal Medicine

## 2018-07-19 DIAGNOSIS — Z79899 Other long term (current) drug therapy: Secondary | ICD-10-CM

## 2018-07-19 DIAGNOSIS — R140 Abdominal distension (gaseous): Secondary | ICD-10-CM | POA: Diagnosis not present

## 2018-07-19 DIAGNOSIS — F0150 Vascular dementia without behavioral disturbance: Secondary | ICD-10-CM | POA: Diagnosis not present

## 2018-07-19 DIAGNOSIS — F333 Major depressive disorder, recurrent, severe with psychotic symptoms: Secondary | ICD-10-CM | POA: Diagnosis not present

## 2018-07-19 DIAGNOSIS — F411 Generalized anxiety disorder: Secondary | ICD-10-CM | POA: Diagnosis not present

## 2018-07-19 DIAGNOSIS — R5383 Other fatigue: Secondary | ICD-10-CM

## 2018-07-19 DIAGNOSIS — G894 Chronic pain syndrome: Secondary | ICD-10-CM

## 2018-07-19 DIAGNOSIS — K5901 Slow transit constipation: Secondary | ICD-10-CM | POA: Diagnosis not present

## 2018-07-19 DIAGNOSIS — D649 Anemia, unspecified: Secondary | ICD-10-CM | POA: Diagnosis not present

## 2018-07-19 DIAGNOSIS — F339 Major depressive disorder, recurrent, unspecified: Secondary | ICD-10-CM | POA: Diagnosis not present

## 2018-07-19 LAB — CBC AND DIFFERENTIAL
HCT: 40 (ref 36–46)
HEMOGLOBIN: 13.8 (ref 12.0–16.0)
Neutrophils Absolute: 3
PLATELETS: 185 (ref 150–399)
WBC: 4.5

## 2018-07-19 LAB — BASIC METABOLIC PANEL
BUN: 15 (ref 4–21)
CREATININE: 0.7 (ref 0.5–1.1)
Glucose: 113
Potassium: 3.5 (ref 3.4–5.3)
Sodium: 138 (ref 137–147)

## 2018-07-19 NOTE — Progress Notes (Signed)
Patient ID: Jennifer Garner, female   DOB: 06/05/1946, 72 y.o.   MRN: [id number]  Location:  [facility]   Place of Service:  SNF (31) Provider:  DR Oracio Galen Ivan Croft, DO  Patient Care Team: Kirt Boys, DO as PCP - General (Internal Medicine) Center, [facility])  Extended Emergency Contact Information Primary Emergency Contact: Jennifer Garner [place] Home Phone: [phone] Relation: Daughter Secondary Emergency Contact: Jennifer Garner  [place] Home Phone: [phone] Relation: Daughter  Code Status:  FULL CODE Goals of care: Advanced Directive information Advanced Directives 07/15/2018  Does Patient Have a Medical Advance Directive? Yes  Type of Advance Directive Out of facility DNR (pink MOST or yellow form)  Does patient want to make changes to medical advance directive? No - Patient declined  Would patient like information on creating a medical advance directive? -  Pre-existing out of facility DNR order (yellow form or pink MOST form) Pink MOST form placed in chart (order not valid for inpatient use)     Chief Complaint  Patient presents with  . Acute Visit    lethargy    HPI:  Pt is a 72 y.o. female seen today for an acute visit for lethargy that began 07/16/18. Nursing reports increased sedation, decreased po intake and increased assistance with ADLs. No f/c. No N/V. Pt reports no abdominal pain but states she has not had a BM since last week and simply does not feel well. Son at bedside and notes pt appears lethargic today and it took approx 20 minutes for her to become more awake while he was sitting at her bedside today. Pt is a poor historian due to dementia. Hx obtained from chart. Pt takes several psych meds for dementia/MDD with psychosis as well as meds for chronic pain. She is followed by psych services.  Vascular dementia - stable on namenda 10 mg twice daily and  aricept 10 mg daily  Recurrent major depression with psychotic features - mood stable on cymbalta 90 mg daily; geodon 20 mg nightly; ativan 1 mg every 6 hours. Followed by psych services   Parkinsonism - stable on sinemet IR 25/100 mg three times daily   Chronic bilateral lower back pain without sciatica/OA - pain controlled on ultram 50 mg four times daily; mobic 15 mg daily;  lidoderm patch to back; biofreeze to neck and knees   Slow transit constipation - stable on miralax 17 gm daily as needed    Past Medical History:  Diagnosis Date  . Allergy   . Anxiety   . Arthritis   . Asthma   . Cataract   . Dementia   . Depression   . Difficulty walking   . Dysphagia, oral phase   . Fibromyalgia   . GERD (gastroesophageal reflux disease)   . Hallucinations   . Hyperlipidemia   . Hypertension   . Lower extremity edema   . Parkinson disease (HCC)   . Repeated falls   . Rhabdomyolysis   . Stroke (HCC)   . TIA (transient ischemic attack)   . Urinary incontinence    Past Surgical History:  Procedure Laterality Date  . ABDOMINAL HYSTERECTOMY    . CATARACT EXTRACTION    . CHOLECYSTECTOMY    . TONSILLECTOMY AND ADENOIDECTOMY      Allergies  Allergen Reactions  . Hydrocodone Other (See Comments)    GI upset    Outpatient Encounter Medications as of 07/19/2018  Medication Sig  .  albuterol (PROVENTIL HFA;VENTOLIN HFA) 108 (90 Base) MCG/ACT inhaler Inhale 2 puffs into the lungs every 6 (six) hours as needed for wheezing or shortness of breath.  Marland Kitchen aluminum-magnesium hydroxide-simethicone (MAALOX) 200-200-20 MG/5ML SUSP Take 30 mLs by mouth every 6 (six) hours as needed. Constipation  . amLODipine (NORVASC) 10 MG tablet Take 10 mg by mouth daily.  Marland Kitchen aspirin EC 81 MG tablet Take 81 mg by mouth daily.  Marland Kitchen atorvastatin (LIPITOR) 10 MG tablet Take 10 mg by mouth daily.  . busPIRone (BUSPAR) 5 MG tablet Take 5 mg by mouth 2 (two) times daily.  . calcium carbonate (TUMS - DOSED IN MG  ELEMENTAL CALCIUM) 500 MG chewable tablet Chew 1 tablet by mouth every 6 (six) hours as needed for indigestion or heartburn.  . carbidopa-levodopa (SINEMET IR) 25-100 MG tablet Take 1 tablet by mouth 3 (three) times daily.   . cholecalciferol (VITAMIN D) 1000 units tablet Take 1,000 Units by mouth daily.  . diphenhydrAMINE (BENADRYL) 25 MG tablet Take 25 mg by mouth every 8 (eight) hours as needed for itching.  . donepezil (ARICEPT) 10 MG tablet Take 10 mg by mouth at bedtime.  . DULoxetine (CYMBALTA) 30 MG capsule Give 3 tablets (90mg ) by mouth daily  . fluticasone (FLONASE) 50 MCG/ACT nasal spray Place 2 sprays into both nostrils daily.  Marland Kitchen gabapentin (NEURONTIN) 100 MG capsule Take 100 mg by mouth at bedtime.  . Lidocaine 4 % PTCH Apply to lower back topically one time a day every AM and remove every PM for pain management  . lithium carbonate 150 MG capsule Take 150 mg by mouth at bedtime.  Marland Kitchen loperamide (IMODIUM) 2 MG capsule Take 2 mg by mouth every 6 (six) hours as needed for diarrhea or loose stools.  Marland Kitchen loratadine (CLARITIN) 10 MG tablet Take 10 mg by mouth daily as needed.   Marland Kitchen LORazepam (ATIVAN) 1 MG tablet Give 1 tablet by mouth every 6 hours routinely  . Melatonin 3 MG TABS Take 1 tablet by mouth at bedtime.   . meloxicam (MOBIC) 15 MG tablet Take 15 mg by mouth daily.   . memantine (NAMENDA) 10 MG tablet Take 10 mg by mouth 2 (two) times daily.  . Menthol, Topical Analgesic, (BIOFREEZE) 4 % GEL Apply 1 application topically 2 (two) times daily. For posterior neck pain and bilateral knees  . methocarbamol (ROBAXIN) 500 MG tablet Take 500 mg by mouth at bedtime.  . Nutritional Supplements (NUTRITIONAL SUPPLEMENT PO) Regular Diet - Regular Texture  . OLANZapine (ZYPREXA) 2.5 MG tablet Take 2.5 mg by mouth daily.  . ondansetron (ZOFRAN) 4 MG tablet Take 4 mg by mouth every 6 (six) hours as needed for nausea or vomiting.  . pantoprazole (PROTONIX) 40 MG tablet Take 40 mg by mouth daily.  .  polyethylene glycol (MIRALAX / GLYCOLAX) packet Take 17 g by mouth daily as needed.   . traMADol (ULTRAM) 50 MG tablet Take 1 tablet (50 mg total) by mouth 4 (four) times daily.  . ziprasidone (GEODON) 20 MG capsule Take 20 mg by mouth every evening.   No facility-administered encounter medications on file as of 07/19/2018.     Review of Systems  Unable to perform ROS: Dementia    Immunization History  Administered Date(s) Administered  . Influenza-Unspecified 03/17/2017, 09/09/2017  . PPD Test 03/24/2017, 06/02/2018, 06/08/2018  . Pneumococcal Polysaccharide-23 03/17/2017, 06/12/2018   Pertinent  Health Maintenance Due  Topic Date Due  . INFLUENZA VACCINE  09/29/2018 (Originally 06/17/2018)  .  MAMMOGRAM  Discontinued  . DEXA SCAN  Discontinued  . COLONOSCOPY  Discontinued  . PNA vac Low Risk Adult  Discontinued   Fall Risk  05/27/2018 05/14/2017  Falls in the past year? No Yes  Number falls in past yr: - 2 or more  Injury with Fall? - No   Functional Status Survey:    Vitals:   07/19/18 1607  BP: 109/62  Pulse: 79  Temp: 97.7 F (36.5 C)  SpO2: 91%  Weight: 171 lb 9.6 oz (77.8 kg)   Body mass index is 28.56 kg/m. Physical Exam  Constitutional: She appears well-developed.  Frail appearing in NAD, looks confused, sitting up in bed  HENT:  Mouth/Throat: Oropharynx is clear and moist. No oropharyngeal exudate.  MMM; no oral thrush  Eyes: Pupils are equal, round, and reactive to light. No scleral icterus.  Neck: Neck supple. Carotid bruit is not present. No tracheal deviation present. No thyromegaly present.  Cardiovascular: Normal rate, regular rhythm and intact distal pulses. Exam reveals no gallop and no friction rub.  Murmur (1/6 SEM ) heard. No LE edema b/l. no calf TTP.   Pulmonary/Chest: Effort normal and breath sounds normal. No stridor. No respiratory distress. She has no wheezes. She has no rales.  Abdominal: Soft. Normal appearance and bowel sounds are normal.  She exhibits distension. She exhibits no mass. There is no hepatomegaly. There is no tenderness. There is no rigidity, no rebound and no guarding. No hernia.  obese  Musculoskeletal: She exhibits edema (small and large joints).  Lymphadenopathy:    She has no cervical adenopathy.  Neurological: She is alert. She has normal reflexes.  Skin: Skin is warm and dry. No rash noted.  Psychiatric: She has a normal mood and affect. Her behavior is normal. Thought content normal.    Labs reviewed: Recent Labs    11/03/17 03/15/18 07/02/18  NA 142 144 145  K 3.6 3.4 3.6  BUN 16 13 16   CREATININE 0.6 0.7 0.8   Recent Labs    11/03/17 03/15/18 07/02/18  AST 12* 11* 10*  ALT 5* 6* 5*  ALKPHOS 120 116 114   Recent Labs    11/03/17 03/15/18 07/02/18  WBC 9.5 6.4 5.2  NEUTROABS 8 4 3   HGB 12.0 12.9 12.8  HCT 35* 37 36  PLT 214 270 260   Lab Results  Component Value Date   TSH 2.86 07/02/2018   Lab Results  Component Value Date   HGBA1C 5.4 03/15/2018   Lab Results  Component Value Date   CHOL 139 03/15/2018   HDL 46 03/15/2018   LDLCALC 74 03/15/2018   TRIG 97 03/15/2018    Significant Diagnostic Results in last 30 days:  No results found.  Assessment/Plan   ICD-10-CM   1. Lethargy R53.83   2. Slow transit constipation K59.01    r/o obstruction  3. High risk medication use Z79.899   4. Severe recurrent major depression with psychotic features (HCC) F33.3   5. Vascular dementia without behavioral disturbance F01.50   6. Chronic pain syndrome G89.4    Place hold parameters on ativan and tramadol as she gets these ATC - may hold for sedation, please hold upcoming dose as pt already sedated and confused  May need to adjust constipation medication pending xray results   Cont other meds as ordered  psych services to follow  Will follow - further recommendations to follow once labs/xray resulted  Family/ staff Communication: meds; s/w son at bedisde  Labs/tests  ordered: STAT abdominal xray flat and upright, cbc w diff, bmp and ua c&s    Samiksha Pellicano S. Ancil Linsey  [facility] [address] [place], [place] [address] [phone] Cell (Monday-Friday 8 AM - 5 PM) [phone] After 5 PM and follow prompts

## 2018-07-20 ENCOUNTER — Encounter: Payer: Self-pay | Admitting: Adult Health

## 2018-07-20 ENCOUNTER — Non-Acute Institutional Stay (SKILLED_NURSING_FACILITY): Payer: Medicare Other | Admitting: Adult Health

## 2018-07-20 DIAGNOSIS — R17 Unspecified jaundice: Secondary | ICD-10-CM | POA: Diagnosis not present

## 2018-07-20 NOTE — Progress Notes (Signed)
Location:   [facility] Room Number: 111 B Place of Service:  SNF (31)   CODE STATUS: Full Code  Allergies  Allergen Reactions  . Hydrocodone Other (See Comments)    GI upset    Chief Complaint  Patient presents with  . Acute Visit    Jaundice    HPI:  Staff reports that she is jaundiced; that she is more lethargic; and is more confused. She is requiring increased assistance with all adl's. She tells me that she is feeling week. She is more confused today. There are no reports of fevers present.    Past Medical History:  Diagnosis Date  . Allergy   . Anxiety   . Arthritis   . Asthma   . Cataract   . Dementia   . Depression   . Difficulty walking   . Dysphagia, oral phase   . Fibromyalgia   . GERD (gastroesophageal reflux disease)   . Hallucinations   . Hyperlipidemia   . Hypertension   . Lower extremity edema   . Parkinson disease (HCC)   . Repeated falls   . Rhabdomyolysis   . Stroke (HCC)   . TIA (transient ischemic attack)   . Urinary incontinence     Past Surgical History:  Procedure Laterality Date  . ABDOMINAL HYSTERECTOMY    . CATARACT EXTRACTION    . CHOLECYSTECTOMY    . TONSILLECTOMY AND ADENOIDECTOMY      Social History   Socioeconomic History  . Marital status: Divorced    Spouse name: Not on file  . Number of children: 4  . Years of education: 56  . Highest education level: High school graduate  Occupational History  . Not on file  Social Needs  . Financial resource strain: Not hard at all  . Food insecurity:    Worry: Never true    Inability: Never true  . Transportation needs:    Medical: No    Non-medical: No  Tobacco Use  . Smoking status: Never Smoker  . Smokeless tobacco: Never Used  Substance and Sexual Activity  . Alcohol use: No  . Drug use: No  . Sexual activity: Never  Lifestyle  . Physical activity:    Days per week: 0 days    Minutes per session: 0 min  . Stress: Not at all  Relationships   . Social connections:    Talks on phone: Three times a week    Gets together: Three times a week    Attends religious service: Never    Active member of club or organization: No    Attends meetings of clubs or organizations: Never    Relationship status: Divorced  . Intimate partner violence:    Fear of current or ex partner: No    Emotionally abused: No    Physically abused: No    Forced sexual activity: No  Other Topics Concern  . Not on file  Social History Narrative   Lives at [facility]   Right-handed.   2 cups caffeine per day.   Family History  Problem Relation Age of Onset  . Non-Hodgkin's lymphoma Mother   . Stroke Mother   . Heart attack Father       VITAL SIGNS BP 138/70   Pulse 79   Temp 97.7 F (36.5 C)   Resp 20   Ht 5\' 5"  (1.651 m)   Wt 171 lb 9.6 oz (77.8 kg)   SpO2 91%   BMI 28.56 kg/m  Outpatient Encounter Medications as of 07/20/2018  Medication Sig  . albuterol (PROVENTIL HFA;VENTOLIN HFA) 108 (90 Base) MCG/ACT inhaler Inhale 2 puffs into the lungs every 6 (six) hours as needed for wheezing or shortness of breath.  Marland Kitchen aluminum-magnesium hydroxide-simethicone (MAALOX) 200-200-20 MG/5ML SUSP Take 30 mLs by mouth every 6 (six) hours as needed. Constipation  . amLODipine (NORVASC) 10 MG tablet Take 10 mg by mouth daily.  Marland Kitchen aspirin EC 81 MG tablet Take 81 mg by mouth daily.  Marland Kitchen atorvastatin (LIPITOR) 10 MG tablet Take 10 mg by mouth daily.  . busPIRone (BUSPAR) 5 MG tablet Take 5 mg by mouth 2 (two) times daily.  . calcium carbonate (TUMS - DOSED IN MG ELEMENTAL CALCIUM) 500 MG chewable tablet Chew 1 tablet by mouth every 6 (six) hours as needed for indigestion or heartburn.  . carbidopa-levodopa (SINEMET IR) 25-100 MG tablet Take 1 tablet by mouth 3 (three) times daily.   . cholecalciferol (VITAMIN D) 1000 units tablet Take 1,000 Units by mouth daily.  . diphenhydrAMINE (BENADRYL) 25 MG tablet Take 25 mg by mouth every 8 (eight) hours as needed  for itching.  . donepezil (ARICEPT) 10 MG tablet Take 10 mg by mouth at bedtime.  . DULoxetine (CYMBALTA) 30 MG capsule Give 3 tablets (90mg ) by mouth daily  . fluticasone (FLONASE) 50 MCG/ACT nasal spray Place 2 sprays into both nostrils daily.  Marland Kitchen gabapentin (NEURONTIN) 100 MG capsule Take 100 mg by mouth at bedtime.  . Lidocaine 4 % PTCH Apply to lower back topically one time a day every AM and remove every PM for pain management  . lithium carbonate 150 MG capsule Take 150 mg by mouth at bedtime.  Marland Kitchen loperamide (IMODIUM) 2 MG capsule Take 2 mg by mouth every 6 (six) hours as needed for diarrhea or loose stools.  Marland Kitchen loratadine (CLARITIN) 10 MG tablet Take 10 mg by mouth daily as needed.   Marland Kitchen LORazepam (ATIVAN) 1 MG tablet Give 1 tablet by mouth every 6 hours routinely  . Melatonin 3 MG TABS Take 1 tablet by mouth at bedtime.   . meloxicam (MOBIC) 15 MG tablet Take 15 mg by mouth daily.   . memantine (NAMENDA) 10 MG tablet Take 10 mg by mouth 2 (two) times daily.  . Menthol, Topical Analgesic, (BIOFREEZE) 4 % GEL Apply 1 application topically 2 (two) times daily. For posterior neck pain and bilateral knees  . methocarbamol (ROBAXIN) 500 MG tablet Take 500 mg by mouth at bedtime.  . Nutritional Supplements (NUTRITIONAL SUPPLEMENT PO) Regular Diet - Regular Texture  . ondansetron (ZOFRAN) 4 MG tablet Take 4 mg by mouth every 6 (six) hours as needed for nausea or vomiting.  . pantoprazole (PROTONIX) 40 MG tablet Take 40 mg by mouth daily.  . polyethylene glycol (MIRALAX / GLYCOLAX) packet Take 17 g by mouth daily as needed.   . traMADol (ULTRAM) 50 MG tablet Take 1 tablet (50 mg total) by mouth 4 (four) times daily.  . ziprasidone (GEODON) 20 MG capsule Take 20 mg by mouth every evening.  . [DISCONTINUED] OLANZapine (ZYPREXA) 2.5 MG tablet Take 2.5 mg by mouth daily.   No facility-administered encounter medications on file as of 07/20/2018.      SIGNIFICANT DIAGNOSTIC EXAMS   TODAY;    07-19-18: KUB: modest amount stool in colon and rectum.   LABS REVIEWED:PREVIOUS   06-19-17: glucose 122; bun 17.3; creat 0.71; k+ 4.0; na++ 141; ca 9.4; liver normal 4.8 11-03-17: wbc 9.5; hgb 12.0;  hct 34.6; mcv 90.7; plt 214; glucose 105; bun 16.1; creat 0.59 ;k+ 3.6; na++ 142; ca 8.9; liver normal albumin 4.2 03-15-18: wbc 6.4; hgb 12.9; hct 37.3; mcv 88.;5 plt 270; glucose 97; bun 12.6; creat 0.71; k+ 3.4; na++ 144; ca 8.9; liver normal albumin 4.0; chol 139; ldl 74; trig 97; hdl 46; hgb a1c 5.4    TODAY:   07-02-18: wbc 5.2; hgb 12.8; hct 36.4; mcv 98.4; plt 260; glucose 105; bun 15.9; creat 0.84; k+ 3.6; na++ 145; ca 9.8; liver albumin 4.3; urine culture: <10,000; lithium <0.10   07-19-18: wbc 4.5; hg 13.8; hct 39.6; mcv 89.;6 plt 185; glucose 113; bun 15.2; creat 0.74; k+ 3.5; na++ 138; ca 9.1   Review of Systems  Constitutional: Positive for malaise/fatigue.  Respiratory: Negative for cough and shortness of breath.   Cardiovascular: Negative for chest pain, palpitations and leg swelling.  Gastrointestinal: Negative for abdominal pain, constipation and heartburn.  Musculoskeletal: Negative for back pain, joint pain and myalgias.  Skin: Negative.   Neurological: Positive for weakness. Negative for dizziness.       Has difficulty feeding her self   Psychiatric/Behavioral: The patient is not nervous/anxious.      Physical Exam  Constitutional: She appears well-developed and well-nourished. No distress.  Eyes:  Mild jaundice in sclera   Neck: No thyromegaly present.  Cardiovascular: Normal rate, regular rhythm and intact distal pulses.  Murmur heard. 1/6  Pulmonary/Chest: Effort normal and breath sounds normal. No respiratory distress.  Abdominal: Soft. Bowel sounds are normal. She exhibits distension. There is no tenderness.  Musculoskeletal: Normal range of motion. She exhibits no edema.  Lymphadenopathy:    She has no cervical adenopathy.  Neurological: She is alert.  Is  confused  Bilateral hand tremor   Skin: Skin is warm and dry. She is not diaphoretic.  Skin is jaundiced   Psychiatric: She has a normal mood and affect.     ASSESSMENT/ PLAN:  TODAY;  1. Jaundice: is new: will get cmp; lithium level and ultrasound of abdomen. Will monitor her status.   MD is aware of resident's narcotic use and is in agreement with current plan of care. We will attempt to wean resident as apropriate   Synthia Innocent NP [facility]  Contact [phone] Monday through Friday 8am- 5pm  After hours call [phone]

## 2018-07-21 ENCOUNTER — Non-Acute Institutional Stay (SKILLED_NURSING_FACILITY): Payer: Medicare Other | Admitting: Adult Health

## 2018-07-21 ENCOUNTER — Encounter: Payer: Self-pay | Admitting: Adult Health

## 2018-07-21 DIAGNOSIS — Z79899 Other long term (current) drug therapy: Secondary | ICD-10-CM | POA: Diagnosis not present

## 2018-07-21 DIAGNOSIS — N390 Urinary tract infection, site not specified: Secondary | ICD-10-CM | POA: Diagnosis not present

## 2018-07-21 DIAGNOSIS — B9620 Unspecified Escherichia coli [E. coli] as the cause of diseases classified elsewhere: Secondary | ICD-10-CM | POA: Diagnosis not present

## 2018-07-21 DIAGNOSIS — R17 Unspecified jaundice: Secondary | ICD-10-CM | POA: Diagnosis not present

## 2018-07-21 DIAGNOSIS — E119 Type 2 diabetes mellitus without complications: Secondary | ICD-10-CM | POA: Diagnosis not present

## 2018-07-21 DIAGNOSIS — Z5181 Encounter for therapeutic drug level monitoring: Secondary | ICD-10-CM | POA: Diagnosis not present

## 2018-07-21 LAB — HEPATIC FUNCTION PANEL
ALK PHOS: 652 — AB (ref 25–125)
ALT: 82 — AB (ref 7–35)
AST: 79 — AB (ref 13–35)
BILIRUBIN, TOTAL: 2.2

## 2018-07-21 LAB — BASIC METABOLIC PANEL
BUN: 15 (ref 4–21)
CREATININE: 0.9 (ref 0.5–1.1)
Glucose: 132
Potassium: 3.4 (ref 3.4–5.3)
Sodium: 141 (ref 137–147)

## 2018-07-21 NOTE — Progress Notes (Signed)
Location:   [facility] Room Number: 111 B Place of Service:  SNF (31)   CODE STATUS: Full Code  Allergies  Allergen Reactions  . Hydrocodone Other (See Comments)    GI upset    Chief Complaint  Patient presents with  . Acute Visit    UTI    HPI:  Her urine culture has returned with e-coli able to be treated with amoxicillin. She is confused; no reports of fevers; her urine is more concentrated with foul odor. She continues to need increased assistance with all her adl's. She is unable to fully participate in the hpi or ros today. Her ultrasound results are pending.   Past Medical History:  Diagnosis Date  . Allergy   . Anxiety   . Arthritis   . Asthma   . Cataract   . Dementia   . Depression   . Difficulty walking   . Dysphagia, oral phase   . Fibromyalgia   . GERD (gastroesophageal reflux disease)   . Hallucinations   . Hyperlipidemia   . Hypertension   . Lower extremity edema   . Parkinson disease (HCC)   . Repeated falls   . Rhabdomyolysis   . Stroke (HCC)   . TIA (transient ischemic attack)   . Urinary incontinence     Past Surgical History:  Procedure Laterality Date  . ABDOMINAL HYSTERECTOMY    . CATARACT EXTRACTION    . CHOLECYSTECTOMY    . TONSILLECTOMY AND ADENOIDECTOMY      Social History   Socioeconomic History  . Marital status: Divorced    Spouse name: Not on file  . Number of children: 4  . Years of education: 18  . Highest education level: High school graduate  Occupational History  . Not on file  Social Needs  . Financial resource strain: Not hard at all  . Food insecurity:    Worry: Never true    Inability: Never true  . Transportation needs:    Medical: No    Non-medical: No  Tobacco Use  . Smoking status: Never Smoker  . Smokeless tobacco: Never Used  Substance and Sexual Activity  . Alcohol use: No  . Drug use: No  . Sexual activity: Never  Lifestyle  . Physical activity:    Days per week: 0 days     Minutes per session: 0 min  . Stress: Not at all  Relationships  . Social connections:    Talks on phone: Three times a week    Gets together: Three times a week    Attends religious service: Never    Active member of club or organization: No    Attends meetings of clubs or organizations: Never    Relationship status: Divorced  . Intimate partner violence:    Fear of current or ex partner: No    Emotionally abused: No    Physically abused: No    Forced sexual activity: No  Other Topics Concern  . Not on file  Social History Narrative   Lives at [facility]   Right-handed.   2 cups caffeine per day.   Family History  Problem Relation Age of Onset  . Non-Hodgkin's lymphoma Mother   . Stroke Mother   . Heart attack Father       VITAL SIGNS BP 135/72   Pulse 79   Temp 97.7 F (36.5 C)   Resp 20   Ht 5\' 5"  (1.651 m)   Wt 171 lb 9.6 oz (  77.8 kg)   SpO2 91%   BMI 28.56 kg/m   Outpatient Encounter Medications as of 07/21/2018  Medication Sig  . albuterol (PROVENTIL HFA;VENTOLIN HFA) 108 (90 Base) MCG/ACT inhaler Inhale 2 puffs into the lungs every 6 (six) hours as needed for wheezing or shortness of breath.  Marland Kitchen aluminum-magnesium hydroxide-simethicone (MAALOX) 200-200-20 MG/5ML SUSP Take 30 mLs by mouth every 6 (six) hours as needed. Constipation  . amLODipine (NORVASC) 10 MG tablet Take 10 mg by mouth daily.  Marland Kitchen aspirin EC 81 MG tablet Take 81 mg by mouth daily.  Marland Kitchen atorvastatin (LIPITOR) 10 MG tablet Take 10 mg by mouth daily.  . busPIRone (BUSPAR) 5 MG tablet Take 5 mg by mouth 2 (two) times daily.  . calcium carbonate (TUMS - DOSED IN MG ELEMENTAL CALCIUM) 500 MG chewable tablet Chew 1 tablet by mouth every 6 (six) hours as needed for indigestion or heartburn.  . carbidopa-levodopa (SINEMET IR) 25-100 MG tablet Take 1 tablet by mouth 3 (three) times daily.   . cholecalciferol (VITAMIN D) 1000 units tablet Take 1,000 Units by mouth daily.  . diphenhydrAMINE  (BENADRYL) 25 MG tablet Take 25 mg by mouth every 8 (eight) hours as needed for itching.  . donepezil (ARICEPT) 10 MG tablet Take 10 mg by mouth at bedtime.  . DULoxetine (CYMBALTA) 30 MG capsule Give 3 tablets (90mg ) by mouth daily  . fluticasone (FLONASE) 50 MCG/ACT nasal spray Place 2 sprays into both nostrils daily.  Marland Kitchen gabapentin (NEURONTIN) 100 MG capsule Take 100 mg by mouth at bedtime.  . Lidocaine 4 % PTCH Apply to lower back topically one time a day every AM and remove every PM for pain management  . lithium carbonate 150 MG capsule Take 150 mg by mouth at bedtime.  Marland Kitchen loperamide (IMODIUM) 2 MG capsule Take 2 mg by mouth every 6 (six) hours as needed for diarrhea or loose stools.  Marland Kitchen loratadine (CLARITIN) 10 MG tablet Take 10 mg by mouth daily as needed.   Marland Kitchen LORazepam (ATIVAN) 1 MG tablet Give 1 tablet by mouth every 6 hours routinely  . Melatonin 3 MG TABS Take 1 tablet by mouth at bedtime.   . meloxicam (MOBIC) 15 MG tablet Take 15 mg by mouth daily.   . memantine (NAMENDA) 10 MG tablet Take 10 mg by mouth 2 (two) times daily.  . Menthol, Topical Analgesic, (BIOFREEZE) 4 % GEL Apply 1 application topically 2 (two) times daily. For posterior neck pain and bilateral knees  . methocarbamol (ROBAXIN) 500 MG tablet Take 500 mg by mouth at bedtime.  . Nutritional Supplements (NUTRITIONAL SUPPLEMENT PO) Regular Diet - Regular Texture  . ondansetron (ZOFRAN) 4 MG tablet Take 4 mg by mouth every 6 (six) hours as needed for nausea or vomiting.  . pantoprazole (PROTONIX) 40 MG tablet Take 40 mg by mouth daily.  . polyethylene glycol (MIRALAX / GLYCOLAX) packet Take 17 g by mouth daily as needed.   . traMADol (ULTRAM) 50 MG tablet Take 1 tablet (50 mg total) by mouth 4 (four) times daily.  . ziprasidone (GEODON) 20 MG capsule Take 20 mg by mouth 2 (two) times daily with a meal.    No facility-administered encounter medications on file as of 07/21/2018.      SIGNIFICANT DIAGNOSTIC  EXAMS  PREVIOUS;   07-19-18: KUB: modest amount stool in colon and rectum.   NO NEW EXAMS.   LABS REVIEWED:PREVIOUS   06-19-17: glucose 122; bun 17.3; creat 0.71; k+ 4.0; na++ 141; ca  9.4; liver normal 4.8 11-03-17: wbc 9.5; hgb 12.0; hct 34.6; mcv 90.7; plt 214; glucose 105; bun 16.1; creat 0.59 ;k+ 3.6; na++ 142; ca 8.9; liver normal albumin 4.2 03-15-18: wbc 6.4; hgb 12.9; hct 37.3; mcv 88.;5 plt 270; glucose 97; bun 12.6; creat 0.71; k+ 3.4; na++ 144; ca 8.9; liver normal albumin 4.0; chol 139; ldl 74; trig 97; hdl 46; hgb a1c 5.4    TODAY:   07-02-18: wbc 5.2; hgb 12.8; hct 36.4; mcv 98.4; plt 260; glucose 105; bun 15.9; creat 0.84; k+ 3.6; na++ 145; ca 9.8; liver albumin 4.3; urine culture: <10,000; lithium <0.10   07-19-18: wbc 4.5; hg 13.8; hct 39.6; mcv 89.;6 plt 185; glucose 113; bun 15.2; creat 0.74; k+ 3.5; na++ 138; ca 9.1  07-19-18: urine culture: e-coli: amoxicillin     Review of Systems  Unable to perform ROS: Dementia (confused )    Physical Exam  Constitutional: She appears well-developed and well-nourished. No distress.  Eyes:  Mild jaundice in sclera   Neck: No thyromegaly present.  Cardiovascular: Normal rate, regular rhythm and intact distal pulses.  Murmur heard. 1/6  Pulmonary/Chest: Effort normal and breath sounds normal. No respiratory distress.  Abdominal: Soft. Bowel sounds are normal. She exhibits no distension. There is no tenderness.  Musculoskeletal: Normal range of motion. She exhibits no edema.  Bilateral hand tremor   Lymphadenopathy:    She has no cervical adenopathy.  Neurological: She is alert.  Confused   Skin: Skin is warm and dry. She is not diaphoretic.  Psychiatric: She has a normal mood and affect.      ASSESSMENT/ PLAN:  TODAY:   1. E-coli UTI; is new: will begin amoxicillin 875 mg twice daily for 10 days with probiotic twice daily for 20 days and will monitor her status.   MD is aware of resident's narcotic use and is in  agreement with current plan of care. We will attempt to wean resident as apropriate   Synthia Innocent NP [facility]  Contact [phone] Monday through Friday 8am- 5pm  After hours call [phone]

## 2018-07-22 ENCOUNTER — Encounter: Payer: Self-pay | Admitting: Adult Health

## 2018-07-22 ENCOUNTER — Non-Acute Institutional Stay (SKILLED_NURSING_FACILITY): Payer: Medicare Other | Admitting: Adult Health

## 2018-07-22 ENCOUNTER — Other Ambulatory Visit: Payer: Self-pay | Admitting: Adult Health

## 2018-07-22 ENCOUNTER — Other Ambulatory Visit: Payer: Self-pay

## 2018-07-22 DIAGNOSIS — F064 Anxiety disorder due to known physiological condition: Secondary | ICD-10-CM | POA: Diagnosis not present

## 2018-07-22 DIAGNOSIS — Z8673 Personal history of transient ischemic attack (TIA), and cerebral infarction without residual deficits: Secondary | ICD-10-CM

## 2018-07-22 DIAGNOSIS — R945 Abnormal results of liver function studies: Secondary | ICD-10-CM

## 2018-07-22 DIAGNOSIS — B9620 Unspecified Escherichia coli [E. coli] as the cause of diseases classified elsewhere: Secondary | ICD-10-CM | POA: Diagnosis not present

## 2018-07-22 DIAGNOSIS — K716 Toxic liver disease with hepatitis, not elsewhere classified: Secondary | ICD-10-CM

## 2018-07-22 DIAGNOSIS — N390 Urinary tract infection, site not specified: Secondary | ICD-10-CM

## 2018-07-22 DIAGNOSIS — R17 Unspecified jaundice: Secondary | ICD-10-CM

## 2018-07-22 DIAGNOSIS — R410 Disorientation, unspecified: Secondary | ICD-10-CM | POA: Diagnosis not present

## 2018-07-22 DIAGNOSIS — F0281 Dementia in other diseases classified elsewhere with behavioral disturbance: Secondary | ICD-10-CM | POA: Diagnosis not present

## 2018-07-22 DIAGNOSIS — G3183 Dementia with Lewy bodies: Secondary | ICD-10-CM | POA: Diagnosis not present

## 2018-07-22 DIAGNOSIS — F250 Schizoaffective disorder, bipolar type: Secondary | ICD-10-CM | POA: Diagnosis not present

## 2018-07-22 DIAGNOSIS — F5105 Insomnia due to other mental disorder: Secondary | ICD-10-CM | POA: Diagnosis not present

## 2018-07-22 DIAGNOSIS — F331 Major depressive disorder, recurrent, moderate: Secondary | ICD-10-CM | POA: Diagnosis not present

## 2018-07-22 DIAGNOSIS — T50905A Adverse effect of unspecified drugs, medicaments and biological substances, initial encounter: Principal | ICD-10-CM

## 2018-07-22 DIAGNOSIS — R7989 Other specified abnormal findings of blood chemistry: Secondary | ICD-10-CM

## 2018-07-22 MED ORDER — LORAZEPAM 1 MG PO TABS: 1.0000 mg | ORAL_TABLET | Freq: Three times a day (TID) | ORAL | 0 refills | Status: DC

## 2018-07-22 MED ORDER — TRAMADOL HCL 50 MG PO TABS: 50.0000 mg | ORAL_TABLET | Freq: Three times a day (TID) | ORAL | 0 refills | Status: DC

## 2018-07-22 NOTE — Progress Notes (Signed)
Location:   [facility] Room Number: [place] of Service:  SNF (31)   CODE STATUS: Full Code  Allergies  Allergen Reactions  . Hydrocodone Other (See Comments)    GI upset    Chief Complaint  Patient presents with  . Acute Visit    Lab follow up    HPI:  Her liver enzymes ast, alt, alk phos are all elevated. Her renal function remains stable. She continues to periods of increased confusion; she is weak; but is able to use bathroom today; she requires assistance with her feeding.  Her abdominal ultraounds did not demonstrate any acute disease process. I have spoken with Dr. Eulas Post regarding her status; she will need further blood work and will need ct of abdomen pelvis and brain. We have both spoken with psych NP; she has been started on lithium. This medication will need to be stopped.  I have spoken with her daughter with the DON of the facility present for the discussion. Her daughter does not want the lithium stopped. She tells me that her mother is doing better on this medication; she is no longer having any hallucinations and is not complaining of any anxiety or pain. I did educate that her mother's liver is failing at this time; that she will need to have lithium stopped due to her liver enzymes. That this could be life threatening. Her daughter did verbalized understanding but continues to demand that the lithium not be stopped. We will need to stop her lipitor. We ill need to lower her ultram and ativan. She is in agreement with those medication changes.  We did discuss hepatitis and even possible cancer as concerns.  At the end of the conversation she did state that she did not want to damage her mother's liver; but insists on keeping the lithium on board.   Past Medical History:  Diagnosis Date  . Allergy   . Anxiety   . Arthritis   . Asthma   . Cataract   . Dementia   . Depression   . Difficulty walking   . Dysphagia, oral phase   . Fibromyalgia   .  GERD (gastroesophageal reflux disease)   . Hallucinations   . Hyperlipidemia   . Hypertension   . Lower extremity edema   . Parkinson disease ([place])   . Repeated falls   . Rhabdomyolysis   . Stroke ([place])   . TIA (transient ischemic attack)   . Urinary incontinence     Past Surgical History:  Procedure Laterality Date  . ABDOMINAL HYSTERECTOMY    . CATARACT EXTRACTION    . CHOLECYSTECTOMY    . TONSILLECTOMY AND ADENOIDECTOMY      Social History   Socioeconomic History  . Marital status: Divorced    Spouse name: Not on file  . Number of children: 4  . Years of education: 68  . Highest education level: High school graduate  Occupational History  . Not on file  Social Needs  . Financial resource strain: Not hard at all  . Food insecurity:    Worry: Never true    Inability: Never true  . Transportation needs:    Medical: No    Non-medical: No  Tobacco Use  . Smoking status: Never Smoker  . Smokeless tobacco: Never Used  Substance and Sexual Activity  . Alcohol use: No  . Drug use: No  . Sexual activity: Never  Lifestyle  . Physical activity:    Days per week:  0 days    Minutes per session: 0 min  . Stress: Not at all  Relationships  . Social connections:    Talks on phone: Three times a week    Gets together: Three times a week    Attends religious service: Never    Active member of club or organization: No    Attends meetings of clubs or organizations: Never    Relationship status: Divorced  . Intimate partner violence:    Fear of current or ex partner: No    Emotionally abused: No    Physically abused: No    Forced sexual activity: No  Other Topics Concern  . Not on file  Social History Narrative   Lives at [facility]   Right-handed.   2 cups caffeine per day.   Family History  Problem Relation Age of Onset  . Non-Hodgkin's lymphoma Mother   . Stroke Mother   . Heart attack Father       VITAL SIGNS BP 136/76   Pulse 79   Temp 97.7 F  (36.5 C)   Resp 20   Ht '5\' 5"'$  (1.651 m)   Wt 171 lb 9.6 oz (77.8 kg)   SpO2 91%   BMI 28.56 kg/m   Outpatient Encounter Medications as of 07/22/2018  Medication Sig  . albuterol (PROVENTIL HFA;VENTOLIN HFA) 108 (90 Base) MCG/ACT inhaler Inhale 2 puffs into the lungs every 6 (six) hours as needed for wheezing or shortness of breath.  Marland Kitchen aluminum-magnesium hydroxide-simethicone (MAALOX) 342-876-81 MG/5ML SUSP Take 30 mLs by mouth every 6 (six) hours as needed. Constipation  . amLODipine (NORVASC) 10 MG tablet Take 10 mg by mouth daily.  Marland Kitchen amoxicillin (AMOXIL) 875 MG tablet Take 875 mg by mouth 2 (two) times daily.  Marland Kitchen aspirin EC 81 MG tablet Take 81 mg by mouth daily.  Marland Kitchen atorvastatin (LIPITOR) 10 MG tablet Take 10 mg by mouth daily.  . busPIRone (BUSPAR) 5 MG tablet Take 5 mg by mouth 2 (two) times daily.  . calcium carbonate (TUMS - DOSED IN MG ELEMENTAL CALCIUM) 500 MG chewable tablet Chew 1 tablet by mouth every 6 (six) hours as needed for indigestion or heartburn.  . carbidopa-levodopa (SINEMET IR) 25-100 MG tablet Take 1 tablet by mouth 3 (three) times daily.   . cholecalciferol (VITAMIN D) 1000 units tablet Take 1,000 Units by mouth daily.  . diphenhydrAMINE (BENADRYL) 25 MG tablet Take 25 mg by mouth every 8 (eight) hours as needed for itching.  . donepezil (ARICEPT) 10 MG tablet Take 10 mg by mouth at bedtime.  . DULoxetine (CYMBALTA) 30 MG capsule Give 3 tablets ('90mg'$ ) by mouth daily  . fluticasone (FLONASE) 50 MCG/ACT nasal spray Place 2 sprays into both nostrils daily.  Marland Kitchen gabapentin (NEURONTIN) 100 MG capsule Take 100 mg by mouth at bedtime.  . Lidocaine 4 % PTCH Apply 2 patches to lower back topically one time a day every AM and remove every PM for pain management  . lithium carbonate 150 MG capsule Take 150 mg by mouth at bedtime.  Marland Kitchen loperamide (IMODIUM) 2 MG capsule Take 2 mg by mouth every 6 (six) hours as needed for diarrhea or loose stools.  Marland Kitchen loratadine (CLARITIN) 10 MG  tablet Take 10 mg by mouth daily as needed.   Marland Kitchen LORazepam (ATIVAN) 1 MG tablet Give 1 tablet by mouth every 6 hours routinely  . Melatonin 3 MG TABS Take 1 tablet by mouth at bedtime.   . meloxicam (MOBIC) 15 MG  tablet Take 15 mg by mouth daily.   . memantine (NAMENDA) 10 MG tablet Take 10 mg by mouth 2 (two) times daily.  . Menthol, Topical Analgesic, (BIOFREEZE) 4 % GEL Apply 1 application topically 2 (two) times daily. For posterior neck pain and bilateral knees  . methocarbamol (ROBAXIN) 500 MG tablet Take 500 mg by mouth at bedtime.  . Nutritional Supplements (NUTRITIONAL SUPPLEMENT PO) Regular Diet - Regular Texture  . ondansetron (ZOFRAN) 4 MG tablet Take 4 mg by mouth every 6 (six) hours as needed for nausea or vomiting.  . pantoprazole (PROTONIX) 40 MG tablet Take 40 mg by mouth daily.  . polyethylene glycol (MIRALAX / GLYCOLAX) packet Take 17 g by mouth daily as needed.   . saccharomyces boulardii (FLORASTOR) 250 MG capsule Take 250 mg by mouth 2 (two) times daily.  . traMADol (ULTRAM) 50 MG tablet Take 1 tablet (50 mg total) by mouth 4 (four) times daily.  . ziprasidone (GEODON) 20 MG capsule Take 20 mg by mouth 2 (two) times daily with a meal.    No facility-administered encounter medications on file as of 07/22/2018.      SIGNIFICANT DIAGNOSTIC EXAMS   PREVIOUS;   07-19-18: KUB: modest amount stool in colon and rectum.   TODAY:   07-21-18: abdominal ultrasound: sequela of prior cholecystomy otherwise unremarkable limited abdominal sonogram examination.     LABS REVIEWED:PREVIOUS   11-03-17: wbc 9.5; hgb 12.0; hct 34.6; mcv 90.7; plt 214; glucose 105; bun 16.1; creat 0.59 ;k+ 3.6; na++ 142; ca 8.9; liver normal albumin 4.2 03-15-18: wbc 6.4; hgb 12.9; hct 37.3; mcv 88.;5 plt 270; glucose 97; bun 12.6; creat 0.71; k+ 3.4; na++ 144; ca 8.9; liver normal albumin 4.0; chol 139; ldl 74; trig 97; hdl 46; hgb a1c 5.4    TODAY:   07-02-18: wbc 5.2; hgb 12.8; hct 36.4; mcv 98.4; plt  260; glucose 105; bun 15.9; creat 0.84; k+ 3.6; na++ 145; ca 9.8; liver albumin 4.3; urine culture: <10,000; lithium <0.10   07-19-18: wbc 4.5; hg 13.8; hct 39.6; mcv 89.;6 plt 185; glucose 113; bun 15.2; creat 0.74; k+ 3.5; na++ 138; ca 9.1  07-19-18: urine culture: e-coli: amoxicillin 07-21-18: glucose 132; bun 15.0; creat 0.87; k+ 3.4;na++ 141; ca 9.4; total bili 2.2; alt 82; ast 79; alk phos 652; albumin 3.8      Review of Systems  Reason unable to perform ROS: confusion.  Constitutional: Negative for malaise/fatigue.  Respiratory: Negative for cough and shortness of breath.   Cardiovascular: Negative for chest pain and leg swelling.  Gastrointestinal: Negative for abdominal pain.  Musculoskeletal: Negative for back pain, joint pain and myalgias.  Skin: Negative.   Neurological: Negative for dizziness.  Psychiatric/Behavioral: The patient is not nervous/anxious.    Physical Exam  Constitutional: She appears well-developed and well-nourished. No distress.  Eyes:  Mild jaundice sclera   Neck: No thyromegaly present.  Cardiovascular: Normal rate, regular rhythm, normal heart sounds and intact distal pulses.  Pulmonary/Chest: Effort normal and breath sounds normal. No respiratory distress.  Abdominal: Soft. Bowel sounds are normal. She exhibits no distension. There is no tenderness.  Musculoskeletal: Normal range of motion. She exhibits no edema.  Bilateral hand tremor   Lymphadenopathy:    She has no cervical adenopathy.  Neurological: She is alert.  Confused   Skin: Skin is warm and dry. She is not diaphoretic.  Jaundice present   Psychiatric: She has a normal mood and affect.    ASSESSMENT/ PLAN:  TODAY:  1. Jaundice 2. Episodic confusion 3. E-coli UTI 4. Elevated liver function tests   Will setup ct of abdomen and pelvis with contrast Ct of head without contrast Will get GGT and hepatitis panel Will stop lipitor vit D Will lower her ativan to 1 mg every 8 hours  routinely Will lower her ultram to 50 mg every 8 hours routinely Will continue lithium at 150 mg daily  She will continue her abt.    Time spent with patient and family 45 minutes: discussed her medical issues; need for further testing and medications verbalized understanding.     MD is aware of resident's narcotic use and is in agreement with current plan of care. We will attempt to wean resident as apropriate   Ok Edwards NP [facility]  Contact [phone] Monday through Friday 8am- 5pm  After hours call [phone]

## 2018-07-22 NOTE — Progress Notes (Signed)
07/21/18

## 2018-07-22 NOTE — Telephone Encounter (Signed)
Rx faxed to [facility] (P) [phone], (F) [phone] 

## 2018-07-23 DIAGNOSIS — B1710 Acute hepatitis C without hepatic coma: Secondary | ICD-10-CM | POA: Diagnosis not present

## 2018-07-23 DIAGNOSIS — Z0189 Encounter for other specified special examinations: Secondary | ICD-10-CM | POA: Diagnosis not present

## 2018-07-25 DIAGNOSIS — B9620 Unspecified Escherichia coli [E. coli] as the cause of diseases classified elsewhere: Secondary | ICD-10-CM | POA: Insufficient documentation

## 2018-07-25 DIAGNOSIS — R7989 Other specified abnormal findings of blood chemistry: Secondary | ICD-10-CM | POA: Insufficient documentation

## 2018-07-25 DIAGNOSIS — R17 Unspecified jaundice: Secondary | ICD-10-CM | POA: Insufficient documentation

## 2018-07-25 DIAGNOSIS — R410 Disorientation, unspecified: Secondary | ICD-10-CM | POA: Insufficient documentation

## 2018-07-25 DIAGNOSIS — N390 Urinary tract infection, site not specified: Principal | ICD-10-CM | POA: Insufficient documentation

## 2018-07-25 DIAGNOSIS — R945 Abnormal results of liver function studies: Secondary | ICD-10-CM

## 2018-07-26 DIAGNOSIS — F339 Major depressive disorder, recurrent, unspecified: Secondary | ICD-10-CM | POA: Diagnosis not present

## 2018-07-26 DIAGNOSIS — F411 Generalized anxiety disorder: Secondary | ICD-10-CM | POA: Diagnosis not present

## 2018-07-28 ENCOUNTER — Ambulatory Visit (HOSPITAL_COMMUNITY)
Admission: RE | Admit: 2018-07-28 | Discharge: 2018-07-28 | Disposition: A | Discharge status: Home / Self Care | Payer: Medicare Other | Source: Ambulatory Visit | Attending: Adult Health | Admitting: Adult Health

## 2018-07-28 DIAGNOSIS — K5730 Diverticulosis of large intestine without perforation or abscess without bleeding: Secondary | ICD-10-CM | POA: Insufficient documentation

## 2018-07-28 DIAGNOSIS — R410 Disorientation, unspecified: Secondary | ICD-10-CM

## 2018-07-28 DIAGNOSIS — T50905A Adverse effect of unspecified drugs, medicaments and biological substances, initial encounter: Principal | ICD-10-CM

## 2018-07-28 DIAGNOSIS — Z90710 Acquired absence of both cervix and uterus: Secondary | ICD-10-CM | POA: Insufficient documentation

## 2018-07-28 DIAGNOSIS — Z9049 Acquired absence of other specified parts of digestive tract: Secondary | ICD-10-CM | POA: Insufficient documentation

## 2018-07-28 DIAGNOSIS — K716 Toxic liver disease with hepatitis, not elsewhere classified: Secondary | ICD-10-CM | POA: Diagnosis not present

## 2018-07-28 DIAGNOSIS — K449 Diaphragmatic hernia without obstruction or gangrene: Secondary | ICD-10-CM | POA: Insufficient documentation

## 2018-07-28 DIAGNOSIS — G459 Transient cerebral ischemic attack, unspecified: Secondary | ICD-10-CM | POA: Insufficient documentation

## 2018-07-28 DIAGNOSIS — I700 Atherosclerosis of aorta: Secondary | ICD-10-CM | POA: Diagnosis not present

## 2018-07-28 DIAGNOSIS — Z8673 Personal history of transient ischemic attack (TIA), and cerebral infarction without residual deficits: Secondary | ICD-10-CM

## 2018-07-28 MED ORDER — IOHEXOL 300 MG/ML  SOLN
100.0000 mL | Freq: Once | INTRAMUSCULAR | Status: AC | PRN
  Administered 2018-07-28: 100 mL via INTRAVENOUS

## 2018-07-29 ENCOUNTER — Encounter: Payer: Self-pay | Admitting: Internal Medicine

## 2018-07-29 ENCOUNTER — Telehealth: Payer: Self-pay | Admitting: Internal Medicine

## 2018-07-29 DIAGNOSIS — F250 Schizoaffective disorder, bipolar type: Secondary | ICD-10-CM | POA: Diagnosis not present

## 2018-07-29 DIAGNOSIS — F5105 Insomnia due to other mental disorder: Secondary | ICD-10-CM | POA: Diagnosis not present

## 2018-07-29 DIAGNOSIS — F331 Major depressive disorder, recurrent, moderate: Secondary | ICD-10-CM | POA: Diagnosis not present

## 2018-07-29 DIAGNOSIS — F0281 Dementia in other diseases classified elsewhere with behavioral disturbance: Secondary | ICD-10-CM | POA: Diagnosis not present

## 2018-07-29 DIAGNOSIS — G3183 Dementia with Lewy bodies: Secondary | ICD-10-CM | POA: Diagnosis not present

## 2018-07-29 DIAGNOSIS — F064 Anxiety disorder due to known physiological condition: Secondary | ICD-10-CM | POA: Diagnosis not present

## 2018-07-29 NOTE — Telephone Encounter (Signed)
S/w pt's daughter, Altha HarmMindy Ramsey, via phone call. Discussed CT head, abdomen/pelvis results. Discussed plan of care going forward. Will continue psych meds as ordered. Reduce meloxicam to 7.5mg  daily. Continue other meds as ordered. Elevated LFTs possibly due to dehydration. Will follow LFTs closely. Daughter agreed.

## 2018-07-29 NOTE — Progress Notes (Signed)
This encounter was created in error - please disregard.

## 2018-08-03 DIAGNOSIS — F411 Generalized anxiety disorder: Secondary | ICD-10-CM | POA: Diagnosis not present

## 2018-08-03 DIAGNOSIS — F339 Major depressive disorder, recurrent, unspecified: Secondary | ICD-10-CM | POA: Diagnosis not present

## 2018-08-05 DIAGNOSIS — Z79899 Other long term (current) drug therapy: Secondary | ICD-10-CM | POA: Diagnosis not present

## 2018-08-05 LAB — HEPATIC FUNCTION PANEL
ALK PHOS: 228 — AB (ref 25–125)
ALT: 7 (ref 7–35)
AST: 22 (ref 13–35)
Bilirubin, Total: 0.6

## 2018-08-09 DIAGNOSIS — F411 Generalized anxiety disorder: Secondary | ICD-10-CM | POA: Diagnosis not present

## 2018-08-09 DIAGNOSIS — F339 Major depressive disorder, recurrent, unspecified: Secondary | ICD-10-CM | POA: Diagnosis not present

## 2018-08-10 ENCOUNTER — Encounter: Payer: Self-pay | Admitting: Adult Health

## 2018-08-10 ENCOUNTER — Non-Acute Institutional Stay (SKILLED_NURSING_FACILITY): Payer: Medicare Other | Admitting: Adult Health

## 2018-08-10 DIAGNOSIS — M150 Primary generalized (osteo)arthritis: Secondary | ICD-10-CM

## 2018-08-10 DIAGNOSIS — E785 Hyperlipidemia, unspecified: Secondary | ICD-10-CM

## 2018-08-10 DIAGNOSIS — K5901 Slow transit constipation: Secondary | ICD-10-CM | POA: Diagnosis not present

## 2018-08-10 DIAGNOSIS — I10 Essential (primary) hypertension: Secondary | ICD-10-CM | POA: Diagnosis not present

## 2018-08-10 DIAGNOSIS — M159 Polyosteoarthritis, unspecified: Secondary | ICD-10-CM

## 2018-08-10 NOTE — Progress Notes (Signed)
Location:   [facility] Room Number: [place] of Service:  SNF (31)   CODE STATUS: Full Code  Allergies  Allergen Reactions  . Hydrocodone Other (See Comments)    GI upset    Chief Complaint  Patient presents with  . Medical Management of Chronic Issues    Constipation; osteoarthritis; dyslipidemia; hypertension     HPI:  She is a 72 year old long term resident of this facility being seen for the management of her chronic illnesses: constipation; osteoarthritis; dyslipidemia hypertension. Her liver enzymes are improving. She is getting out of bed daily now and is participating in therapy. She is drinking more fluids. She is complaining of increased shaking and tremors. There are no nursing concerns at this time.   Past Medical History:  Diagnosis Date  . Allergy   . Anxiety   . Arthritis   . Asthma   . Cataract   . Dementia   . Depression   . Difficulty walking   . Dysphagia, oral phase   . Fibromyalgia   . GERD (gastroesophageal reflux disease)   . Hallucinations   . Hyperlipidemia   . Hypertension   . Lower extremity edema   . Parkinson disease ([place])   . Repeated falls   . Rhabdomyolysis   . Stroke ([place])   . TIA (transient ischemic attack)   . Urinary incontinence     Past Surgical History:  Procedure Laterality Date  . ABDOMINAL HYSTERECTOMY    . CATARACT EXTRACTION    . CHOLECYSTECTOMY    . TONSILLECTOMY AND ADENOIDECTOMY      Social History   Socioeconomic History  . Marital status: Divorced    Spouse name: Not on file  . Number of children: 4  . Years of education: 66  . Highest education level: High school graduate  Occupational History  . Not on file  Social Needs  . Financial resource strain: Not hard at all  . Food insecurity:    Worry: Never true    Inability: Never true  . Transportation needs:    Medical: No    Non-medical: No  Tobacco Use  . Smoking status: Never Smoker  . Smokeless tobacco: Never Used    Substance and Sexual Activity  . Alcohol use: No  . Drug use: No  . Sexual activity: Never  Lifestyle  . Physical activity:    Days per week: 0 days    Minutes per session: 0 min  . Stress: Not at all  Relationships  . Social connections:    Talks on phone: Three times a week    Gets together: Three times a week    Attends religious service: Never    Active member of club or organization: No    Attends meetings of clubs or organizations: Never    Relationship status: Divorced  . Intimate partner violence:    Fear of current or ex partner: No    Emotionally abused: No    Physically abused: No    Forced sexual activity: No  Other Topics Concern  . Not on file  Social History Narrative   Lives at [facility]   Right-handed.   2 cups caffeine per day.   Family History  Problem Relation Age of Onset  . Non-Hodgkin's lymphoma Mother   . Stroke Mother   . Heart attack Father       VITAL SIGNS BP 138/70   Pulse 72   Temp (!) 96.8 F (36 C)  Resp 20   Ht '5\' 5"'$  (1.651 m)   Wt 171 lb 9.6 oz (77.8 kg)   SpO2 98%   BMI 28.56 kg/m   Outpatient Encounter Medications as of 08/10/2018  Medication Sig  . albuterol (PROVENTIL HFA;VENTOLIN HFA) 108 (90 Base) MCG/ACT inhaler Inhale 2 puffs into the lungs every 6 (six) hours as needed for wheezing or shortness of breath.  Marland Kitchen aluminum-magnesium hydroxide-simethicone (MAALOX) 086-761-95 MG/5ML SUSP Take 30 mLs by mouth every 6 (six) hours as needed. Constipation  . amLODipine (NORVASC) 10 MG tablet Take 10 mg by mouth daily.  Marland Kitchen aspirin EC 81 MG tablet Take 81 mg by mouth daily.  . busPIRone (BUSPAR) 5 MG tablet Take 5 mg by mouth 2 (two) times daily.  . calcium carbonate (TUMS - DOSED IN MG ELEMENTAL CALCIUM) 500 MG chewable tablet Chew 1 tablet by mouth every 6 (six) hours as needed for indigestion or heartburn.  . carbidopa-levodopa (SINEMET IR) 25-100 MG tablet Take 1 tablet by mouth 3 (three) times daily.   .  cholecalciferol (VITAMIN D) 1000 units tablet Take 1,000 Units by mouth daily.  . cyclobenzaprine (FLEXERIL) 10 MG tablet Take 10 mg by mouth at bedtime.  . diphenhydrAMINE (BENADRYL) 25 MG tablet Take 25 mg by mouth every 8 (eight) hours as needed for itching.  . donepezil (ARICEPT) 10 MG tablet Take 10 mg by mouth at bedtime.  . DULoxetine (CYMBALTA) 30 MG capsule Give 3 tablets ('90mg'$ ) by mouth daily  . fluticasone (FLONASE) 50 MCG/ACT nasal spray Place 2 sprays into both nostrils daily.  Marland Kitchen gabapentin (NEURONTIN) 100 MG capsule Take 100 mg by mouth at bedtime.  . Lidocaine 4 % PTCH Apply 2 patches to lower back topically one time a day every AM and remove every PM for pain management  . lithium carbonate 150 MG capsule Take 150 mg by mouth at bedtime.  Marland Kitchen loperamide (IMODIUM) 2 MG capsule Take 2 mg by mouth every 6 (six) hours as needed for diarrhea or loose stools.  Marland Kitchen loratadine (CLARITIN) 10 MG tablet Take 10 mg by mouth daily as needed.   Marland Kitchen LORazepam (ATIVAN) 1 MG tablet Take 1 tablet (1 mg total) by mouth every 8 (eight) hours.  . Melatonin 3 MG TABS Take 1 tablet by mouth at bedtime.   . meloxicam (MOBIC) 15 MG tablet Take 15 mg by mouth daily.   . memantine (NAMENDA) 10 MG tablet Take 10 mg by mouth 2 (two) times daily.  . Menthol, Topical Analgesic, (BIOFREEZE) 4 % GEL Apply 1 application topically 2 (two) times daily. For posterior neck pain and bilateral knees  . Nutritional Supplements (NUTRITIONAL SUPPLEMENT PO) Regular Diet - Regular Texture  . ondansetron (ZOFRAN) 4 MG tablet Take 4 mg by mouth every 6 (six) hours as needed for nausea or vomiting.  . pantoprazole (PROTONIX) 40 MG tablet Take 40 mg by mouth daily.  . polyethylene glycol (MIRALAX / GLYCOLAX) packet Take 17 g by mouth daily as needed.   . traMADol (ULTRAM) 50 MG tablet Take 1 tablet (50 mg total) by mouth every 8 (eight) hours.  . ziprasidone (GEODON) 20 MG capsule Take 20 mg by mouth 2 (two) times daily with a meal.    . [DISCONTINUED] atorvastatin (LIPITOR) 10 MG tablet Take 10 mg by mouth daily.  . [DISCONTINUED] saccharomyces boulardii (FLORASTOR) 250 MG capsule Take 250 mg by mouth 2 (two) times daily.   No facility-administered encounter medications on file as of 08/10/2018.  SIGNIFICANT DIAGNOSTIC EXAMS  PREVIOUS;   07-19-18: KUB: modest amount stool in colon and rectum.  07-21-18: abdominal ultrasound: sequela of prior cholecystomy otherwise unremarkable limited abdominal sonogram examination.     TODAY:   07-28-18: ct of head:  Atrophy with extensive supratentorial small vessel disease. No acute infarct evident. No mass or hemorrhage. Foci of arterial vascular calcification noted. There is ethmoid sinus disease.  07-28-18: ct of abdomen and pelvis:  1. No acute abnormality of the abdomen or pelvis. 2. Colonic diverticulosis without acute diverticulitis. 3. Cholecystectomy, hysterectomy. 4. Small hiatal hernia. 5.  Aortic atherosclerosis.   LABS REVIEWED:PREVIOUS   11-03-17: wbc 9.5; hgb 12.0; hct 34.6; mcv 90.7; plt 214; glucose 105; bun 16.1; creat 0.59 ;k+ 3.6; na++ 142; ca 8.9; liver normal albumin 4.2 03-15-18: wbc 6.4; hgb 12.9; hct 37.3; mcv 88.;5 plt 270; glucose 97; bun 12.6; creat 0.71; k+ 3.4; na++ 144; ca 8.9; liver normal albumin 4.0; chol 139; ldl 74; trig 97; hdl 46; hgb a1c 5.4   07-02-18: wbc 5.2; hgb 12.8; hct 36.4; mcv 98.4; plt 260; glucose 105; bun 15.9; creat 0.84; k+ 3.6; na++ 145; ca 9.8; liver albumin 4.3; urine culture: <10,000; lithium <0.10   07-19-18: wbc 4.5; hg 13.8; hct 39.6; mcv 89.;6 plt 185; glucose 113; bun 15.2; creat 0.74; k+ 3.5; na++ 138; ca 9.1  07-19-18: urine culture: e-coli: amoxicillin 07-21-18: glucose 132; bun 15.0; creat 0.87; k+ 3.4;na++ 141; ca 9.4; total bili 2.2; alt 82; ast 79; alk phos 652; albumin 3.8    TODAY:   07-23-18: GGT 213; hepatitis panel: neg 08-05-18: alt 7 ast 22 alk phos 228 albumin 4.2    Review of Systems  Constitutional:  Negative for malaise/fatigue.  Respiratory: Negative for cough and shortness of breath.   Cardiovascular: Negative for chest pain, palpitations and leg swelling.  Gastrointestinal: Negative for abdominal pain, constipation and heartburn.  Musculoskeletal: Negative for back pain, joint pain and myalgias.  Skin: Negative.   Neurological: Positive for tremors. Negative for dizziness.  Psychiatric/Behavioral: The patient is not nervous/anxious.     Physical Exam  Constitutional: She is oriented to person, place, and time. She appears well-developed and well-nourished. No distress.  Neck: No thyromegaly present.  Cardiovascular: Normal rate, regular rhythm, normal heart sounds and intact distal pulses.  Pulmonary/Chest: Effort normal and breath sounds normal. No respiratory distress.  Abdominal: Soft. Bowel sounds are normal. She exhibits no distension. There is no tenderness.  Musculoskeletal: Normal range of motion. She exhibits no edema.  Bilateral hand tremor   Lymphadenopathy:    She has no cervical adenopathy.  Neurological: She is alert and oriented to person, place, and time.  Skin: Skin is warm and dry. She is not diaphoretic.  Psychiatric: She has a normal mood and affect.     ASSESSMENT/ PLAN:  TODAY:   1. Primary osteoarthritis involving multiple joints: stable will continue mobic 15 mg daily biofreeze twice daily to posterior neck  ultram 50 mg every 8 hours    2. Slow transit constipation: stable will continue  miralax 17 gm daily as needed    3. Benign essential hypertension: stable b/p 138/70 will continue norvasc 10 mg daily   4. Dyslipidemia: stable  ldl 74:her lipitor was stopped due to elevated liver enzymes   PREVIOUS   5.  Asthma with allergic rhinitis without complication:without change  will continue flonase daily; claritin 10 mg daily as needed and albuterol 2 puffs every 6 hours as needed  6. Jerrye Bushy without  esophagitis: stable  will continue protonix 40 mg  daily   7. Elevated liver enzymes: are improving will continue to monitor her status.   8. Vascular dementia without behavioral disturbance: stable her weight is 171 pounds; will continue namenda 10 mg twice daily and aricept 10 mg daily  will continue to monitor her status.  9. Severe episode of recurrent major depressive disorder with  Is stable will continue cymbalta 90 mg daily  geodon 20 mg twice ativan 1 mg every 8 hours will continue lithium 150 mg daily  She is tolerating the lower dose of ativan   10. Parkinsonism: without change will continue sinemet IR 25/100 mg three times daily   11. Chronic bilateral lower back pain without sciatica: worse ; will continue ultram 50 mg every 8 hours  mobic 15 mg daily  lidoderm patch to back and flexeril 10 mg nightly; biofreeze to neck and knees twice daily and neurontin 100 mg nightly       MD is aware of resident's narcotic use and is in agreement with current plan of care. We will attempt to wean resident as apropriate   Ok Edwards NP [facility]  Contact [phone] Monday through Friday 8am- 5pm  After hours call [phone]

## 2018-08-13 DIAGNOSIS — F250 Schizoaffective disorder, bipolar type: Secondary | ICD-10-CM | POA: Diagnosis not present

## 2018-08-13 DIAGNOSIS — F0281 Dementia in other diseases classified elsewhere with behavioral disturbance: Secondary | ICD-10-CM | POA: Diagnosis not present

## 2018-08-13 DIAGNOSIS — F064 Anxiety disorder due to known physiological condition: Secondary | ICD-10-CM | POA: Diagnosis not present

## 2018-08-13 DIAGNOSIS — G3183 Dementia with Lewy bodies: Secondary | ICD-10-CM | POA: Diagnosis not present

## 2018-08-13 DIAGNOSIS — F331 Major depressive disorder, recurrent, moderate: Secondary | ICD-10-CM | POA: Diagnosis not present

## 2018-08-13 DIAGNOSIS — F5105 Insomnia due to other mental disorder: Secondary | ICD-10-CM | POA: Diagnosis not present

## 2018-08-17 DIAGNOSIS — G20 Parkinson's disease: Secondary | ICD-10-CM | POA: Diagnosis not present

## 2018-08-17 DIAGNOSIS — M797 Fibromyalgia: Secondary | ICD-10-CM | POA: Diagnosis not present

## 2018-08-17 DIAGNOSIS — Z23 Encounter for immunization: Secondary | ICD-10-CM | POA: Diagnosis not present

## 2018-08-17 DIAGNOSIS — M6281 Muscle weakness (generalized): Secondary | ICD-10-CM | POA: Diagnosis not present

## 2018-08-17 DIAGNOSIS — M545 Low back pain: Secondary | ICD-10-CM | POA: Diagnosis not present

## 2018-08-17 DIAGNOSIS — M179 Osteoarthritis of knee, unspecified: Secondary | ICD-10-CM | POA: Diagnosis not present

## 2018-08-18 DIAGNOSIS — G20 Parkinson's disease: Secondary | ICD-10-CM | POA: Diagnosis not present

## 2018-08-18 DIAGNOSIS — Z23 Encounter for immunization: Secondary | ICD-10-CM | POA: Diagnosis not present

## 2018-08-18 DIAGNOSIS — M797 Fibromyalgia: Secondary | ICD-10-CM | POA: Diagnosis not present

## 2018-08-18 DIAGNOSIS — M179 Osteoarthritis of knee, unspecified: Secondary | ICD-10-CM | POA: Diagnosis not present

## 2018-08-18 DIAGNOSIS — M545 Low back pain: Secondary | ICD-10-CM | POA: Diagnosis not present

## 2018-08-18 DIAGNOSIS — M6281 Muscle weakness (generalized): Secondary | ICD-10-CM | POA: Diagnosis not present

## 2018-08-19 DIAGNOSIS — M179 Osteoarthritis of knee, unspecified: Secondary | ICD-10-CM | POA: Diagnosis not present

## 2018-08-19 DIAGNOSIS — M6281 Muscle weakness (generalized): Secondary | ICD-10-CM | POA: Diagnosis not present

## 2018-08-19 DIAGNOSIS — Z23 Encounter for immunization: Secondary | ICD-10-CM | POA: Diagnosis not present

## 2018-08-19 DIAGNOSIS — M545 Low back pain: Secondary | ICD-10-CM | POA: Diagnosis not present

## 2018-08-19 DIAGNOSIS — G20 Parkinson's disease: Secondary | ICD-10-CM | POA: Diagnosis not present

## 2018-08-19 DIAGNOSIS — M797 Fibromyalgia: Secondary | ICD-10-CM | POA: Diagnosis not present

## 2018-08-20 DIAGNOSIS — M179 Osteoarthritis of knee, unspecified: Secondary | ICD-10-CM | POA: Diagnosis not present

## 2018-08-20 DIAGNOSIS — M545 Low back pain: Secondary | ICD-10-CM | POA: Diagnosis not present

## 2018-08-20 DIAGNOSIS — G20 Parkinson's disease: Secondary | ICD-10-CM | POA: Diagnosis not present

## 2018-08-20 DIAGNOSIS — M797 Fibromyalgia: Secondary | ICD-10-CM | POA: Diagnosis not present

## 2018-08-20 DIAGNOSIS — M6281 Muscle weakness (generalized): Secondary | ICD-10-CM | POA: Diagnosis not present

## 2018-08-20 DIAGNOSIS — Z23 Encounter for immunization: Secondary | ICD-10-CM | POA: Diagnosis not present

## 2018-08-21 ENCOUNTER — Encounter: Payer: Self-pay | Admitting: Adult Health

## 2018-08-23 DIAGNOSIS — M179 Osteoarthritis of knee, unspecified: Secondary | ICD-10-CM | POA: Diagnosis not present

## 2018-08-23 DIAGNOSIS — F411 Generalized anxiety disorder: Secondary | ICD-10-CM | POA: Diagnosis not present

## 2018-08-23 DIAGNOSIS — G20 Parkinson's disease: Secondary | ICD-10-CM | POA: Diagnosis not present

## 2018-08-23 DIAGNOSIS — M797 Fibromyalgia: Secondary | ICD-10-CM | POA: Diagnosis not present

## 2018-08-23 DIAGNOSIS — M6281 Muscle weakness (generalized): Secondary | ICD-10-CM | POA: Diagnosis not present

## 2018-08-23 DIAGNOSIS — Z23 Encounter for immunization: Secondary | ICD-10-CM | POA: Diagnosis not present

## 2018-08-23 DIAGNOSIS — M545 Low back pain: Secondary | ICD-10-CM | POA: Diagnosis not present

## 2018-08-23 DIAGNOSIS — F339 Major depressive disorder, recurrent, unspecified: Secondary | ICD-10-CM | POA: Diagnosis not present

## 2018-08-24 DIAGNOSIS — G20 Parkinson's disease: Secondary | ICD-10-CM | POA: Diagnosis not present

## 2018-08-24 DIAGNOSIS — M179 Osteoarthritis of knee, unspecified: Secondary | ICD-10-CM | POA: Diagnosis not present

## 2018-08-24 DIAGNOSIS — M797 Fibromyalgia: Secondary | ICD-10-CM | POA: Diagnosis not present

## 2018-08-24 DIAGNOSIS — M545 Low back pain: Secondary | ICD-10-CM | POA: Diagnosis not present

## 2018-08-24 DIAGNOSIS — M6281 Muscle weakness (generalized): Secondary | ICD-10-CM | POA: Diagnosis not present

## 2018-08-24 DIAGNOSIS — Z23 Encounter for immunization: Secondary | ICD-10-CM | POA: Diagnosis not present

## 2018-08-25 DIAGNOSIS — Z23 Encounter for immunization: Secondary | ICD-10-CM | POA: Diagnosis not present

## 2018-08-25 DIAGNOSIS — M797 Fibromyalgia: Secondary | ICD-10-CM | POA: Diagnosis not present

## 2018-08-25 DIAGNOSIS — M6281 Muscle weakness (generalized): Secondary | ICD-10-CM | POA: Diagnosis not present

## 2018-08-25 DIAGNOSIS — G20 Parkinson's disease: Secondary | ICD-10-CM | POA: Diagnosis not present

## 2018-08-25 DIAGNOSIS — M179 Osteoarthritis of knee, unspecified: Secondary | ICD-10-CM | POA: Diagnosis not present

## 2018-08-25 DIAGNOSIS — M545 Low back pain: Secondary | ICD-10-CM | POA: Diagnosis not present

## 2018-08-26 DIAGNOSIS — G3183 Dementia with Lewy bodies: Secondary | ICD-10-CM | POA: Diagnosis not present

## 2018-08-26 DIAGNOSIS — F331 Major depressive disorder, recurrent, moderate: Secondary | ICD-10-CM | POA: Diagnosis not present

## 2018-08-26 DIAGNOSIS — M6281 Muscle weakness (generalized): Secondary | ICD-10-CM | POA: Diagnosis not present

## 2018-08-26 DIAGNOSIS — F064 Anxiety disorder due to known physiological condition: Secondary | ICD-10-CM | POA: Diagnosis not present

## 2018-08-26 DIAGNOSIS — M797 Fibromyalgia: Secondary | ICD-10-CM | POA: Diagnosis not present

## 2018-08-26 DIAGNOSIS — G20 Parkinson's disease: Secondary | ICD-10-CM | POA: Diagnosis not present

## 2018-08-26 DIAGNOSIS — M545 Low back pain: Secondary | ICD-10-CM | POA: Diagnosis not present

## 2018-08-26 DIAGNOSIS — F5105 Insomnia due to other mental disorder: Secondary | ICD-10-CM | POA: Diagnosis not present

## 2018-08-26 DIAGNOSIS — M179 Osteoarthritis of knee, unspecified: Secondary | ICD-10-CM | POA: Diagnosis not present

## 2018-08-26 DIAGNOSIS — F0281 Dementia in other diseases classified elsewhere with behavioral disturbance: Secondary | ICD-10-CM | POA: Diagnosis not present

## 2018-08-26 DIAGNOSIS — F250 Schizoaffective disorder, bipolar type: Secondary | ICD-10-CM | POA: Diagnosis not present

## 2018-08-26 DIAGNOSIS — Z23 Encounter for immunization: Secondary | ICD-10-CM | POA: Diagnosis not present

## 2018-08-27 DIAGNOSIS — M6281 Muscle weakness (generalized): Secondary | ICD-10-CM | POA: Diagnosis not present

## 2018-08-27 DIAGNOSIS — G20 Parkinson's disease: Secondary | ICD-10-CM | POA: Diagnosis not present

## 2018-08-27 DIAGNOSIS — Z23 Encounter for immunization: Secondary | ICD-10-CM | POA: Diagnosis not present

## 2018-08-27 DIAGNOSIS — M797 Fibromyalgia: Secondary | ICD-10-CM | POA: Diagnosis not present

## 2018-08-27 DIAGNOSIS — M545 Low back pain: Secondary | ICD-10-CM | POA: Diagnosis not present

## 2018-08-27 DIAGNOSIS — M179 Osteoarthritis of knee, unspecified: Secondary | ICD-10-CM | POA: Diagnosis not present

## 2018-08-30 DIAGNOSIS — M179 Osteoarthritis of knee, unspecified: Secondary | ICD-10-CM | POA: Diagnosis not present

## 2018-08-30 DIAGNOSIS — M545 Low back pain: Secondary | ICD-10-CM | POA: Diagnosis not present

## 2018-08-30 DIAGNOSIS — M6281 Muscle weakness (generalized): Secondary | ICD-10-CM | POA: Diagnosis not present

## 2018-08-30 DIAGNOSIS — G20 Parkinson's disease: Secondary | ICD-10-CM | POA: Diagnosis not present

## 2018-08-30 DIAGNOSIS — M797 Fibromyalgia: Secondary | ICD-10-CM | POA: Diagnosis not present

## 2018-08-30 DIAGNOSIS — Z23 Encounter for immunization: Secondary | ICD-10-CM | POA: Diagnosis not present

## 2018-08-31 DIAGNOSIS — G20 Parkinson's disease: Secondary | ICD-10-CM | POA: Diagnosis not present

## 2018-08-31 DIAGNOSIS — M179 Osteoarthritis of knee, unspecified: Secondary | ICD-10-CM | POA: Diagnosis not present

## 2018-08-31 DIAGNOSIS — M6281 Muscle weakness (generalized): Secondary | ICD-10-CM | POA: Diagnosis not present

## 2018-08-31 DIAGNOSIS — Z23 Encounter for immunization: Secondary | ICD-10-CM | POA: Diagnosis not present

## 2018-08-31 DIAGNOSIS — M797 Fibromyalgia: Secondary | ICD-10-CM | POA: Diagnosis not present

## 2018-08-31 DIAGNOSIS — M545 Low back pain: Secondary | ICD-10-CM | POA: Diagnosis not present

## 2018-09-02 ENCOUNTER — Encounter: Payer: Self-pay | Admitting: Internal Medicine

## 2018-09-02 ENCOUNTER — Non-Acute Institutional Stay (SKILLED_NURSING_FACILITY): Payer: Medicare Other | Admitting: Internal Medicine

## 2018-09-02 DIAGNOSIS — M545 Low back pain: Secondary | ICD-10-CM | POA: Diagnosis not present

## 2018-09-02 DIAGNOSIS — I10 Essential (primary) hypertension: Secondary | ICD-10-CM | POA: Diagnosis not present

## 2018-09-02 DIAGNOSIS — F0150 Vascular dementia without behavioral disturbance: Secondary | ICD-10-CM | POA: Diagnosis not present

## 2018-09-02 DIAGNOSIS — M159 Polyosteoarthritis, unspecified: Secondary | ICD-10-CM

## 2018-09-02 DIAGNOSIS — M150 Primary generalized (osteo)arthritis: Secondary | ICD-10-CM | POA: Diagnosis not present

## 2018-09-02 DIAGNOSIS — G20 Parkinson's disease: Secondary | ICD-10-CM | POA: Diagnosis not present

## 2018-09-02 DIAGNOSIS — K5901 Slow transit constipation: Secondary | ICD-10-CM

## 2018-09-02 DIAGNOSIS — M179 Osteoarthritis of knee, unspecified: Secondary | ICD-10-CM | POA: Diagnosis not present

## 2018-09-02 DIAGNOSIS — M6281 Muscle weakness (generalized): Secondary | ICD-10-CM | POA: Diagnosis not present

## 2018-09-02 DIAGNOSIS — Z23 Encounter for immunization: Secondary | ICD-10-CM | POA: Diagnosis not present

## 2018-09-02 DIAGNOSIS — M797 Fibromyalgia: Secondary | ICD-10-CM | POA: Diagnosis not present

## 2018-09-02 NOTE — Progress Notes (Signed)
Patient ID: Jennifer Garner, female   DOB: 1946/08/23, 72 y.o.   MRN: [id number]   Location:  [facility] Room Number: [address] Place of Service:  SNF [phone]) Provider:  DR Elena Davia Ivan Croft, DO  Patient Care Team: Kirt Boys, DO as PCP - General (Internal Medicine) Center, [facility] (Skilled Nursing Facility)  Extended Emergency Contact Information Primary Emergency Contact: [name] [place] Home Phone: [phone] Relation: Daughter Secondary Emergency Contact: [name],[name]  [place] Home Phone: [phone] Relation: Daughter  Code Status:  Full Code Goals of care: Advanced Directive information Advanced Directives 09/02/2018  Does Patient Have a Medical Advance Directive? Yes  Type of Advance Directive Out of facility DNR (pink MOST or yellow form)  Does patient want to make changes to medical advance directive? No - Patient declined  Would patient like information on creating a medical advance directive? -  Pre-existing out of facility DNR order (yellow form or pink MOST form) Pink MOST form placed in chart (order not valid for inpatient use)     Chief Complaint  Patient presents with  . Medical Management of Chronic Issues        HPI:  Pt is a 72 y.o. female seen today for medical management of chronic diseases.  She c/o constipation. She had some relief with rectal suppository yesterday. She is now drinking glass of miralax. She has gas and bloating. No f/c. No bloody stools. She is a poor historian due to dementia; hx obtained from chart  Primary osteoarthritis involving multiple joints: stable will continue mobic 15 mg daily biofreeze twice daily to posterior neck  ultram 50 mg every 8 hours    Constipation - stable on miralax 17 gm daily as needed    HTN - BP stable on norvasc 10 mg daily   Dyslipidemia - stable; she is off lipitor due to elevated liver enzymes; LDL 74   Asthma with  allergic rhinitis - stable on flonase daily; claritin 10 mg daily as needed and albuterol 2 puffs every 6 hours as needed  GERD - stable on protonix 40 mg daily   Vascular dementia - stable on namenda 10 mg twice daily and aricept 10 mg daily.  Recurrent MDD - mood stable on cymbalta 90 mg daily;  geodon 20 mg;  ativan 1 mg every 8 hours; lithium 150 mg daily  Parkinsonism - stable on sinemet IR 25/100 mg three times daily   Chronic bilateral lower back pain without sciatica - stable on ultram 50 mg every 8 hours;  mobic 15 mg daily;  lidoderm patch to back; flexeril 10 mg nightly; biofreeze to neck and knees twice daily; neurontin 100 mg nightly     Past Medical History:  Diagnosis Date  . Allergy   . Anxiety   . Arthritis   . Asthma   . Benign essential hypertension 03/18/2017  . Cataract   . Dementia ([facility])   . Depression   . Difficulty walking   . Dysphagia, oral phase   . Fibromyalgia   . GERD (gastroesophageal reflux disease)   . Hallucinations   . Hyperlipidemia   . Hypertension   . Lower extremity edema   . Parkinson disease ([facility])   . Repeated falls   . Rhabdomyolysis   . Stroke ([facility])   . TIA (transient ischemic attack)   . Urinary incontinence    Past Surgical History:  Procedure Laterality Date  . ABDOMINAL HYSTERECTOMY    .  CATARACT EXTRACTION    . CHOLECYSTECTOMY    . TONSILLECTOMY AND ADENOIDECTOMY      Allergies  Allergen Reactions  . Hydrocodone Other (See Comments)    GI upset    Outpatient Encounter Medications as of 09/02/2018  Medication Sig  . albuterol (PROVENTIL HFA;VENTOLIN HFA) 108 (90 Base) MCG/ACT inhaler Inhale 2 puffs into the lungs every 6 (six) hours as needed for wheezing or shortness of breath.  Marland Kitchen aluminum-magnesium hydroxide-simethicone (MAALOX) 200-200-20 MG/5ML SUSP Take 30 mLs by mouth every 6 (six) hours as needed. Constipation  . amLODipine (NORVASC) 10 MG tablet Take 10 mg by mouth daily.  Marland Kitchen aspirin EC 81 MG tablet Take 81 mg  by mouth daily.  . bisacodyl (BISACODYL LAXATIVE) 10 MG suppository Place 10 mg rectally as needed for moderate constipation.  . busPIRone (BUSPAR) 5 MG tablet Take 5 mg by mouth 2 (two) times daily.  . calcium carbonate (TUMS - DOSED IN MG ELEMENTAL CALCIUM) 500 MG chewable tablet Chew 1 tablet by mouth every 6 (six) hours as needed for indigestion or heartburn.  . carbidopa-levodopa (SINEMET IR) 25-100 MG tablet Take 1 tablet by mouth 3 (three) times daily.   . cyclobenzaprine (FLEXERIL) 10 MG tablet Take 10 mg by mouth at bedtime.  . diphenhydrAMINE (BENADRYL) 25 MG tablet Take 25 mg by mouth every 8 (eight) hours as needed for itching.  . donepezil (ARICEPT) 10 MG tablet Take 10 mg by mouth at bedtime.  . DULoxetine (CYMBALTA) 30 MG capsule Give 3 tablets (90mg ) by mouth daily  . fluticasone (FLONASE) 50 MCG/ACT nasal spray Place 2 sprays into both nostrils daily.  Marland Kitchen gabapentin (NEURONTIN) 100 MG capsule Take 100 mg by mouth at bedtime.  . Lidocaine 4 % PTCH Apply 2 patches to lower back topically one time a day every AM and remove every PM for pain management  . lithium carbonate 150 MG capsule Take 150 mg by mouth at bedtime.  Marland Kitchen loperamide (IMODIUM) 2 MG capsule Take 2 mg by mouth every 6 (six) hours as needed for diarrhea or loose stools.  Marland Kitchen loratadine (CLARITIN) 10 MG tablet Take 10 mg by mouth daily as needed.   Marland Kitchen LORazepam (ATIVAN) 1 MG tablet Take 1 tablet (1 mg total) by mouth every 8 (eight) hours.  . Melatonin 3 MG TABS Take 1 tablet by mouth at bedtime.   . meloxicam (MOBIC) 15 MG tablet Take 15 mg by mouth daily.   . memantine (NAMENDA) 10 MG tablet Take 10 mg by mouth 2 (two) times daily.  . Menthol, Topical Analgesic, (BIOFREEZE) 4 % GEL Apply 1 application topically 2 (two) times daily. For posterior neck pain and bilateral knees  . methocarbamol (ROBAXIN) 500 MG tablet Take 500 mg by mouth at bedtime.  . Nutritional Supplements (NUTRITIONAL SUPPLEMENT PO) Regular Diet -  Regular Texture  . ondansetron (ZOFRAN) 4 MG tablet Take 4 mg by mouth every 6 (six) hours as needed for nausea or vomiting.  . pantoprazole (PROTONIX) 40 MG tablet Take 40 mg by mouth daily.  . polyethylene glycol (MIRALAX / GLYCOLAX) packet Take 17 g by mouth daily as needed.   . traMADol (ULTRAM) 50 MG tablet Take 1 tablet (50 mg total) by mouth every 8 (eight) hours.  . ziprasidone (GEODON) 20 MG capsule Take 20 mg by mouth 2 (two) times daily with a meal.   . cholecalciferol (VITAMIN D) 1000 units tablet Take 1,000 Units by mouth daily.   No facility-administered encounter medications on file  as of 09/02/2018.     Review of Systems  Unable to perform ROS: Dementia    Immunization History  Administered Date(s) Administered  . Influenza-Unspecified 03/17/2017, 09/09/2017  . PPD Test 03/24/2017, 06/02/2018, 06/08/2018  . Pneumococcal Polysaccharide-23 03/17/2017, 06/12/2018   Pertinent  Health Maintenance Due  Topic Date Due  . INFLUENZA VACCINE  09/29/2018 (Originally 06/17/2018)  . MAMMOGRAM  Discontinued  . DEXA SCAN  Discontinued  . COLONOSCOPY  Discontinued  . PNA vac Low Risk Adult  Discontinued   Fall Risk  05/27/2018 05/14/2017  Falls in the past year? No Yes  Number falls in past yr: - 2 or more  Injury with Fall? - No   Functional Status Survey:    Vitals:   09/02/18 1045  BP: 122/74  Pulse: 75  Resp: 20  Temp: 97.9 F (36.6 C)  SpO2: 97%  Weight: 165 lb 12.8 oz (75.2 kg)  Height: 5\' 5"  (1.651 m)   Body mass index is 27.59 kg/m. Physical Exam  Constitutional: She is oriented to person, place, and time. She appears well-developed and well-nourished.  Lying in bed in NAD  HENT:  Mouth/Throat: Oropharynx is clear and moist. No oropharyngeal exudate.  MMM; no oral thrush  Eyes: Pupils are equal, round, and reactive to light. No scleral icterus.  Neck: Neck supple. Carotid bruit is not present. No tracheal deviation present. No thyromegaly present.    Cardiovascular: Normal rate, regular rhythm and intact distal pulses. Exam reveals no gallop and no friction rub.  Murmur (1/6 SEM) heard. No LE edema b/l. no calf TTP.   Pulmonary/Chest: Effort normal and breath sounds normal. No stridor. No respiratory distress. She has no wheezes. She has no rales.  Abdominal: Soft. Normal appearance and bowel sounds are normal. She exhibits distension. She exhibits no mass. There is no hepatomegaly. There is tenderness (epigastric). There is no rigidity, no rebound and no guarding. No hernia.  obese  Musculoskeletal: She exhibits edema (small joints).  Lymphadenopathy:    She has no cervical adenopathy.  Neurological: She is alert and oriented to person, place, and time. She has normal reflexes.  Skin: Skin is warm and dry. No rash noted.  Psychiatric: She has a normal mood and affect. Her behavior is normal. Thought content normal.    Labs reviewed: Recent Labs    07/02/18 07/19/18 07/21/18  NA 145 138 141  K 3.6 3.5 3.4  BUN 16 15 15   CREATININE 0.8 0.7 0.9   Recent Labs    07/02/18 07/21/18 08/05/18  AST 10* 79* 22  ALT 5* 82* 7  ALKPHOS 114 652* 228*   Recent Labs    03/15/18 07/02/18 07/19/18  WBC 6.4 5.2 4.5  NEUTROABS 4 3 3   HGB 12.9 12.8 13.8  HCT 37 36 40  PLT 270 260 185   Lab Results  Component Value Date   TSH 2.86 07/02/2018   Lab Results  Component Value Date   HGBA1C 5.4 03/15/2018   Lab Results  Component Value Date   CHOL 139 03/15/2018   HDL 46 03/15/2018   LDLCALC 74 03/15/2018   TRIG 97 03/15/2018    Significant Diagnostic Results in last 30 days:  No results found.  Assessment/Plan   ICD-10-CM   1. Slow transit constipation K59.01   2. Vascular dementia without behavioral disturbance (HCC) F01.50   3. Benign essential hypertension I10   4. Primary osteoarthritis involving multiple joints M15.0      Cont current meds as ordered  PT/OT/ST as ordered  [facility] NP to follow  Will  follow  Labs/tests ordered: none   Ronnae Kaser S. Ancil Linsey  [facility] [address] [place], [place] [address] [phone] Cell (Monday-Friday 8 AM - 5 PM) [phone] After 5 PM and follow prompts

## 2018-09-03 DIAGNOSIS — Z23 Encounter for immunization: Secondary | ICD-10-CM | POA: Diagnosis not present

## 2018-09-03 DIAGNOSIS — M545 Low back pain: Secondary | ICD-10-CM | POA: Diagnosis not present

## 2018-09-03 DIAGNOSIS — G20 Parkinson's disease: Secondary | ICD-10-CM | POA: Diagnosis not present

## 2018-09-03 DIAGNOSIS — M179 Osteoarthritis of knee, unspecified: Secondary | ICD-10-CM | POA: Diagnosis not present

## 2018-09-03 DIAGNOSIS — M6281 Muscle weakness (generalized): Secondary | ICD-10-CM | POA: Diagnosis not present

## 2018-09-03 DIAGNOSIS — M797 Fibromyalgia: Secondary | ICD-10-CM | POA: Diagnosis not present

## 2018-09-04 DIAGNOSIS — Z23 Encounter for immunization: Secondary | ICD-10-CM | POA: Diagnosis not present

## 2018-09-06 ENCOUNTER — Other Ambulatory Visit: Payer: Self-pay

## 2018-09-06 DIAGNOSIS — G20 Parkinson's disease: Secondary | ICD-10-CM | POA: Diagnosis not present

## 2018-09-06 DIAGNOSIS — M545 Low back pain: Secondary | ICD-10-CM | POA: Diagnosis not present

## 2018-09-06 DIAGNOSIS — M6281 Muscle weakness (generalized): Secondary | ICD-10-CM | POA: Diagnosis not present

## 2018-09-06 DIAGNOSIS — Z23 Encounter for immunization: Secondary | ICD-10-CM | POA: Diagnosis not present

## 2018-09-06 DIAGNOSIS — M179 Osteoarthritis of knee, unspecified: Secondary | ICD-10-CM | POA: Diagnosis not present

## 2018-09-06 DIAGNOSIS — M797 Fibromyalgia: Secondary | ICD-10-CM | POA: Diagnosis not present

## 2018-09-06 MED ORDER — LORAZEPAM 1 MG PO TABS: 1.0000 mg | ORAL_TABLET | Freq: Three times a day (TID) | ORAL | 0 refills | Status: DC

## 2018-09-06 MED ORDER — TRAMADOL HCL 50 MG PO TABS: 50.0000 mg | ORAL_TABLET | Freq: Three times a day (TID) | ORAL | 0 refills | Status: DC

## 2018-09-06 NOTE — Telephone Encounter (Signed)
Rx faxed to [facility] (P) [phone], (F) [phone] 

## 2018-09-07 DIAGNOSIS — M545 Low back pain: Secondary | ICD-10-CM | POA: Diagnosis not present

## 2018-09-07 DIAGNOSIS — M179 Osteoarthritis of knee, unspecified: Secondary | ICD-10-CM | POA: Diagnosis not present

## 2018-09-07 DIAGNOSIS — M6281 Muscle weakness (generalized): Secondary | ICD-10-CM | POA: Diagnosis not present

## 2018-09-07 DIAGNOSIS — M797 Fibromyalgia: Secondary | ICD-10-CM | POA: Diagnosis not present

## 2018-09-07 DIAGNOSIS — G20 Parkinson's disease: Secondary | ICD-10-CM | POA: Diagnosis not present

## 2018-09-07 DIAGNOSIS — Z23 Encounter for immunization: Secondary | ICD-10-CM | POA: Diagnosis not present

## 2018-09-08 DIAGNOSIS — G20 Parkinson's disease: Secondary | ICD-10-CM | POA: Diagnosis not present

## 2018-09-08 DIAGNOSIS — Z23 Encounter for immunization: Secondary | ICD-10-CM | POA: Diagnosis not present

## 2018-09-08 DIAGNOSIS — M6281 Muscle weakness (generalized): Secondary | ICD-10-CM | POA: Diagnosis not present

## 2018-09-08 DIAGNOSIS — M797 Fibromyalgia: Secondary | ICD-10-CM | POA: Diagnosis not present

## 2018-09-08 DIAGNOSIS — M179 Osteoarthritis of knee, unspecified: Secondary | ICD-10-CM | POA: Diagnosis not present

## 2018-09-08 DIAGNOSIS — M545 Low back pain: Secondary | ICD-10-CM | POA: Diagnosis not present

## 2018-09-09 DIAGNOSIS — M6281 Muscle weakness (generalized): Secondary | ICD-10-CM | POA: Diagnosis not present

## 2018-09-09 DIAGNOSIS — M545 Low back pain: Secondary | ICD-10-CM | POA: Diagnosis not present

## 2018-09-09 DIAGNOSIS — M797 Fibromyalgia: Secondary | ICD-10-CM | POA: Diagnosis not present

## 2018-09-09 DIAGNOSIS — G20 Parkinson's disease: Secondary | ICD-10-CM | POA: Diagnosis not present

## 2018-09-09 DIAGNOSIS — M179 Osteoarthritis of knee, unspecified: Secondary | ICD-10-CM | POA: Diagnosis not present

## 2018-09-09 DIAGNOSIS — Z23 Encounter for immunization: Secondary | ICD-10-CM | POA: Diagnosis not present

## 2018-09-10 DIAGNOSIS — M179 Osteoarthritis of knee, unspecified: Secondary | ICD-10-CM | POA: Diagnosis not present

## 2018-09-10 DIAGNOSIS — M6281 Muscle weakness (generalized): Secondary | ICD-10-CM | POA: Diagnosis not present

## 2018-09-10 DIAGNOSIS — M797 Fibromyalgia: Secondary | ICD-10-CM | POA: Diagnosis not present

## 2018-09-10 DIAGNOSIS — G20 Parkinson's disease: Secondary | ICD-10-CM | POA: Diagnosis not present

## 2018-09-10 DIAGNOSIS — Z23 Encounter for immunization: Secondary | ICD-10-CM | POA: Diagnosis not present

## 2018-09-10 DIAGNOSIS — M545 Low back pain: Secondary | ICD-10-CM | POA: Diagnosis not present

## 2018-09-13 DIAGNOSIS — G20 Parkinson's disease: Secondary | ICD-10-CM | POA: Diagnosis not present

## 2018-09-13 DIAGNOSIS — Z23 Encounter for immunization: Secondary | ICD-10-CM | POA: Diagnosis not present

## 2018-09-13 DIAGNOSIS — M545 Low back pain: Secondary | ICD-10-CM | POA: Diagnosis not present

## 2018-09-13 DIAGNOSIS — M797 Fibromyalgia: Secondary | ICD-10-CM | POA: Diagnosis not present

## 2018-09-13 DIAGNOSIS — M179 Osteoarthritis of knee, unspecified: Secondary | ICD-10-CM | POA: Diagnosis not present

## 2018-09-13 DIAGNOSIS — M6281 Muscle weakness (generalized): Secondary | ICD-10-CM | POA: Diagnosis not present

## 2018-09-14 DIAGNOSIS — F419 Anxiety disorder, unspecified: Secondary | ICD-10-CM | POA: Diagnosis not present

## 2018-09-14 DIAGNOSIS — M179 Osteoarthritis of knee, unspecified: Secondary | ICD-10-CM | POA: Diagnosis not present

## 2018-09-14 DIAGNOSIS — Z23 Encounter for immunization: Secondary | ICD-10-CM | POA: Diagnosis not present

## 2018-09-14 DIAGNOSIS — G20 Parkinson's disease: Secondary | ICD-10-CM | POA: Diagnosis not present

## 2018-09-14 DIAGNOSIS — M545 Low back pain: Secondary | ICD-10-CM | POA: Diagnosis not present

## 2018-09-14 DIAGNOSIS — M797 Fibromyalgia: Secondary | ICD-10-CM | POA: Diagnosis not present

## 2018-09-14 DIAGNOSIS — F339 Major depressive disorder, recurrent, unspecified: Secondary | ICD-10-CM | POA: Diagnosis not present

## 2018-09-14 DIAGNOSIS — M6281 Muscle weakness (generalized): Secondary | ICD-10-CM | POA: Diagnosis not present

## 2018-09-15 DIAGNOSIS — Z23 Encounter for immunization: Secondary | ICD-10-CM | POA: Diagnosis not present

## 2018-09-15 DIAGNOSIS — M6281 Muscle weakness (generalized): Secondary | ICD-10-CM | POA: Diagnosis not present

## 2018-09-15 DIAGNOSIS — M179 Osteoarthritis of knee, unspecified: Secondary | ICD-10-CM | POA: Diagnosis not present

## 2018-09-15 DIAGNOSIS — G20 Parkinson's disease: Secondary | ICD-10-CM | POA: Diagnosis not present

## 2018-09-15 DIAGNOSIS — M545 Low back pain: Secondary | ICD-10-CM | POA: Diagnosis not present

## 2018-09-15 DIAGNOSIS — M797 Fibromyalgia: Secondary | ICD-10-CM | POA: Diagnosis not present

## 2018-09-16 DIAGNOSIS — M179 Osteoarthritis of knee, unspecified: Secondary | ICD-10-CM | POA: Diagnosis not present

## 2018-09-16 DIAGNOSIS — R262 Difficulty in walking, not elsewhere classified: Secondary | ICD-10-CM | POA: Diagnosis not present

## 2018-09-16 DIAGNOSIS — M545 Low back pain: Secondary | ICD-10-CM | POA: Diagnosis not present

## 2018-09-16 DIAGNOSIS — M6281 Muscle weakness (generalized): Secondary | ICD-10-CM | POA: Diagnosis not present

## 2018-09-16 DIAGNOSIS — M797 Fibromyalgia: Secondary | ICD-10-CM | POA: Diagnosis not present

## 2018-09-16 DIAGNOSIS — I739 Peripheral vascular disease, unspecified: Secondary | ICD-10-CM | POA: Diagnosis not present

## 2018-09-16 DIAGNOSIS — Z23 Encounter for immunization: Secondary | ICD-10-CM | POA: Diagnosis not present

## 2018-09-16 DIAGNOSIS — G20 Parkinson's disease: Secondary | ICD-10-CM | POA: Diagnosis not present

## 2018-09-16 DIAGNOSIS — B351 Tinea unguium: Secondary | ICD-10-CM | POA: Diagnosis not present

## 2018-09-17 DIAGNOSIS — G20 Parkinson's disease: Secondary | ICD-10-CM | POA: Diagnosis not present

## 2018-09-17 DIAGNOSIS — M797 Fibromyalgia: Secondary | ICD-10-CM | POA: Diagnosis not present

## 2018-09-17 DIAGNOSIS — M179 Osteoarthritis of knee, unspecified: Secondary | ICD-10-CM | POA: Diagnosis not present

## 2018-09-17 DIAGNOSIS — M6281 Muscle weakness (generalized): Secondary | ICD-10-CM | POA: Diagnosis not present

## 2018-09-17 DIAGNOSIS — M545 Low back pain: Secondary | ICD-10-CM | POA: Diagnosis not present

## 2018-09-20 DIAGNOSIS — F411 Generalized anxiety disorder: Secondary | ICD-10-CM | POA: Diagnosis not present

## 2018-09-20 DIAGNOSIS — F339 Major depressive disorder, recurrent, unspecified: Secondary | ICD-10-CM | POA: Diagnosis not present

## 2018-09-21 DIAGNOSIS — M797 Fibromyalgia: Secondary | ICD-10-CM | POA: Diagnosis not present

## 2018-09-21 DIAGNOSIS — M6281 Muscle weakness (generalized): Secondary | ICD-10-CM | POA: Diagnosis not present

## 2018-09-21 DIAGNOSIS — M545 Low back pain: Secondary | ICD-10-CM | POA: Diagnosis not present

## 2018-09-21 DIAGNOSIS — M179 Osteoarthritis of knee, unspecified: Secondary | ICD-10-CM | POA: Diagnosis not present

## 2018-09-21 DIAGNOSIS — G20 Parkinson's disease: Secondary | ICD-10-CM | POA: Diagnosis not present

## 2018-09-22 DIAGNOSIS — M545 Low back pain: Secondary | ICD-10-CM | POA: Diagnosis not present

## 2018-09-22 DIAGNOSIS — M6281 Muscle weakness (generalized): Secondary | ICD-10-CM | POA: Diagnosis not present

## 2018-09-22 DIAGNOSIS — M797 Fibromyalgia: Secondary | ICD-10-CM | POA: Diagnosis not present

## 2018-09-22 DIAGNOSIS — M179 Osteoarthritis of knee, unspecified: Secondary | ICD-10-CM | POA: Diagnosis not present

## 2018-09-22 DIAGNOSIS — G20 Parkinson's disease: Secondary | ICD-10-CM | POA: Diagnosis not present

## 2018-09-23 DIAGNOSIS — G8929 Other chronic pain: Secondary | ICD-10-CM | POA: Diagnosis not present

## 2018-09-23 DIAGNOSIS — F331 Major depressive disorder, recurrent, moderate: Secondary | ICD-10-CM | POA: Diagnosis not present

## 2018-09-23 DIAGNOSIS — M6281 Muscle weakness (generalized): Secondary | ICD-10-CM | POA: Diagnosis not present

## 2018-09-23 DIAGNOSIS — F5105 Insomnia due to other mental disorder: Secondary | ICD-10-CM | POA: Diagnosis not present

## 2018-09-23 DIAGNOSIS — M545 Low back pain: Secondary | ICD-10-CM | POA: Diagnosis not present

## 2018-09-23 DIAGNOSIS — F0280 Dementia in other diseases classified elsewhere without behavioral disturbance: Secondary | ICD-10-CM | POA: Diagnosis not present

## 2018-09-23 DIAGNOSIS — G3183 Dementia with Lewy bodies: Secondary | ICD-10-CM | POA: Diagnosis not present

## 2018-09-23 DIAGNOSIS — F250 Schizoaffective disorder, bipolar type: Secondary | ICD-10-CM | POA: Diagnosis not present

## 2018-09-23 DIAGNOSIS — F064 Anxiety disorder due to known physiological condition: Secondary | ICD-10-CM | POA: Diagnosis not present

## 2018-09-23 DIAGNOSIS — M179 Osteoarthritis of knee, unspecified: Secondary | ICD-10-CM | POA: Diagnosis not present

## 2018-09-23 DIAGNOSIS — F0281 Dementia in other diseases classified elsewhere with behavioral disturbance: Secondary | ICD-10-CM | POA: Diagnosis not present

## 2018-09-23 DIAGNOSIS — M797 Fibromyalgia: Secondary | ICD-10-CM | POA: Diagnosis not present

## 2018-09-23 DIAGNOSIS — G20 Parkinson's disease: Secondary | ICD-10-CM | POA: Diagnosis not present

## 2018-09-23 DIAGNOSIS — R262 Difficulty in walking, not elsewhere classified: Secondary | ICD-10-CM | POA: Diagnosis not present

## 2018-09-24 DIAGNOSIS — M6281 Muscle weakness (generalized): Secondary | ICD-10-CM | POA: Diagnosis not present

## 2018-09-24 DIAGNOSIS — M179 Osteoarthritis of knee, unspecified: Secondary | ICD-10-CM | POA: Diagnosis not present

## 2018-09-24 DIAGNOSIS — M797 Fibromyalgia: Secondary | ICD-10-CM | POA: Diagnosis not present

## 2018-09-24 DIAGNOSIS — M545 Low back pain: Secondary | ICD-10-CM | POA: Diagnosis not present

## 2018-09-24 DIAGNOSIS — G20 Parkinson's disease: Secondary | ICD-10-CM | POA: Diagnosis not present

## 2018-09-27 DIAGNOSIS — M179 Osteoarthritis of knee, unspecified: Secondary | ICD-10-CM | POA: Diagnosis not present

## 2018-09-27 DIAGNOSIS — M545 Low back pain: Secondary | ICD-10-CM | POA: Diagnosis not present

## 2018-09-27 DIAGNOSIS — M797 Fibromyalgia: Secondary | ICD-10-CM | POA: Diagnosis not present

## 2018-09-27 DIAGNOSIS — F339 Major depressive disorder, recurrent, unspecified: Secondary | ICD-10-CM | POA: Diagnosis not present

## 2018-09-27 DIAGNOSIS — F411 Generalized anxiety disorder: Secondary | ICD-10-CM | POA: Diagnosis not present

## 2018-09-27 DIAGNOSIS — G20 Parkinson's disease: Secondary | ICD-10-CM | POA: Diagnosis not present

## 2018-09-27 DIAGNOSIS — M6281 Muscle weakness (generalized): Secondary | ICD-10-CM | POA: Diagnosis not present

## 2018-09-28 DIAGNOSIS — M6281 Muscle weakness (generalized): Secondary | ICD-10-CM | POA: Diagnosis not present

## 2018-09-28 DIAGNOSIS — M179 Osteoarthritis of knee, unspecified: Secondary | ICD-10-CM | POA: Diagnosis not present

## 2018-09-28 DIAGNOSIS — G20 Parkinson's disease: Secondary | ICD-10-CM | POA: Diagnosis not present

## 2018-09-28 DIAGNOSIS — M797 Fibromyalgia: Secondary | ICD-10-CM | POA: Diagnosis not present

## 2018-09-28 DIAGNOSIS — M545 Low back pain: Secondary | ICD-10-CM | POA: Diagnosis not present

## 2018-09-29 DIAGNOSIS — G20 Parkinson's disease: Secondary | ICD-10-CM | POA: Diagnosis not present

## 2018-09-29 DIAGNOSIS — M6281 Muscle weakness (generalized): Secondary | ICD-10-CM | POA: Diagnosis not present

## 2018-09-29 DIAGNOSIS — M797 Fibromyalgia: Secondary | ICD-10-CM | POA: Diagnosis not present

## 2018-09-29 DIAGNOSIS — M545 Low back pain: Secondary | ICD-10-CM | POA: Diagnosis not present

## 2018-09-29 DIAGNOSIS — M179 Osteoarthritis of knee, unspecified: Secondary | ICD-10-CM | POA: Diagnosis not present

## 2018-09-30 DIAGNOSIS — M545 Low back pain: Secondary | ICD-10-CM | POA: Diagnosis not present

## 2018-09-30 DIAGNOSIS — M797 Fibromyalgia: Secondary | ICD-10-CM | POA: Diagnosis not present

## 2018-09-30 DIAGNOSIS — M6281 Muscle weakness (generalized): Secondary | ICD-10-CM | POA: Diagnosis not present

## 2018-09-30 DIAGNOSIS — G20 Parkinson's disease: Secondary | ICD-10-CM | POA: Diagnosis not present

## 2018-09-30 DIAGNOSIS — M179 Osteoarthritis of knee, unspecified: Secondary | ICD-10-CM | POA: Diagnosis not present

## 2018-10-03 DIAGNOSIS — M797 Fibromyalgia: Secondary | ICD-10-CM | POA: Diagnosis not present

## 2018-10-03 DIAGNOSIS — M6281 Muscle weakness (generalized): Secondary | ICD-10-CM | POA: Diagnosis not present

## 2018-10-03 DIAGNOSIS — M545 Low back pain: Secondary | ICD-10-CM | POA: Diagnosis not present

## 2018-10-03 DIAGNOSIS — M179 Osteoarthritis of knee, unspecified: Secondary | ICD-10-CM | POA: Diagnosis not present

## 2018-10-03 DIAGNOSIS — G20 Parkinson's disease: Secondary | ICD-10-CM | POA: Diagnosis not present

## 2018-10-04 DIAGNOSIS — M179 Osteoarthritis of knee, unspecified: Secondary | ICD-10-CM | POA: Diagnosis not present

## 2018-10-04 DIAGNOSIS — G20 Parkinson's disease: Secondary | ICD-10-CM | POA: Diagnosis not present

## 2018-10-04 DIAGNOSIS — M797 Fibromyalgia: Secondary | ICD-10-CM | POA: Diagnosis not present

## 2018-10-04 DIAGNOSIS — M6281 Muscle weakness (generalized): Secondary | ICD-10-CM | POA: Diagnosis not present

## 2018-10-04 DIAGNOSIS — M545 Low back pain: Secondary | ICD-10-CM | POA: Diagnosis not present

## 2018-10-05 DIAGNOSIS — G20 Parkinson's disease: Secondary | ICD-10-CM | POA: Diagnosis not present

## 2018-10-05 DIAGNOSIS — M6281 Muscle weakness (generalized): Secondary | ICD-10-CM | POA: Diagnosis not present

## 2018-10-05 DIAGNOSIS — M545 Low back pain: Secondary | ICD-10-CM | POA: Diagnosis not present

## 2018-10-05 DIAGNOSIS — M797 Fibromyalgia: Secondary | ICD-10-CM | POA: Diagnosis not present

## 2018-10-05 DIAGNOSIS — M179 Osteoarthritis of knee, unspecified: Secondary | ICD-10-CM | POA: Diagnosis not present

## 2018-10-06 DIAGNOSIS — F339 Major depressive disorder, recurrent, unspecified: Secondary | ICD-10-CM | POA: Diagnosis not present

## 2018-10-06 DIAGNOSIS — M545 Low back pain: Secondary | ICD-10-CM | POA: Diagnosis not present

## 2018-10-06 DIAGNOSIS — F419 Anxiety disorder, unspecified: Secondary | ICD-10-CM | POA: Diagnosis not present

## 2018-10-06 DIAGNOSIS — M797 Fibromyalgia: Secondary | ICD-10-CM | POA: Diagnosis not present

## 2018-10-06 DIAGNOSIS — M6281 Muscle weakness (generalized): Secondary | ICD-10-CM | POA: Diagnosis not present

## 2018-10-06 DIAGNOSIS — M179 Osteoarthritis of knee, unspecified: Secondary | ICD-10-CM | POA: Diagnosis not present

## 2018-10-06 DIAGNOSIS — G20 Parkinson's disease: Secondary | ICD-10-CM | POA: Diagnosis not present

## 2018-10-07 DIAGNOSIS — M797 Fibromyalgia: Secondary | ICD-10-CM | POA: Diagnosis not present

## 2018-10-07 DIAGNOSIS — M179 Osteoarthritis of knee, unspecified: Secondary | ICD-10-CM | POA: Diagnosis not present

## 2018-10-07 DIAGNOSIS — M6281 Muscle weakness (generalized): Secondary | ICD-10-CM | POA: Diagnosis not present

## 2018-10-07 DIAGNOSIS — G20 Parkinson's disease: Secondary | ICD-10-CM | POA: Diagnosis not present

## 2018-10-07 DIAGNOSIS — M545 Low back pain: Secondary | ICD-10-CM | POA: Diagnosis not present

## 2018-10-08 DIAGNOSIS — M797 Fibromyalgia: Secondary | ICD-10-CM | POA: Diagnosis not present

## 2018-10-08 DIAGNOSIS — M545 Low back pain: Secondary | ICD-10-CM | POA: Diagnosis not present

## 2018-10-08 DIAGNOSIS — M6281 Muscle weakness (generalized): Secondary | ICD-10-CM | POA: Diagnosis not present

## 2018-10-08 DIAGNOSIS — G20 Parkinson's disease: Secondary | ICD-10-CM | POA: Diagnosis not present

## 2018-10-08 DIAGNOSIS — M179 Osteoarthritis of knee, unspecified: Secondary | ICD-10-CM | POA: Diagnosis not present

## 2018-10-11 DIAGNOSIS — M797 Fibromyalgia: Secondary | ICD-10-CM | POA: Diagnosis not present

## 2018-10-11 DIAGNOSIS — M179 Osteoarthritis of knee, unspecified: Secondary | ICD-10-CM | POA: Diagnosis not present

## 2018-10-11 DIAGNOSIS — M6281 Muscle weakness (generalized): Secondary | ICD-10-CM | POA: Diagnosis not present

## 2018-10-11 DIAGNOSIS — M545 Low back pain: Secondary | ICD-10-CM | POA: Diagnosis not present

## 2018-10-11 DIAGNOSIS — G20 Parkinson's disease: Secondary | ICD-10-CM | POA: Diagnosis not present

## 2018-10-12 DIAGNOSIS — M797 Fibromyalgia: Secondary | ICD-10-CM | POA: Diagnosis not present

## 2018-10-12 DIAGNOSIS — M545 Low back pain: Secondary | ICD-10-CM | POA: Diagnosis not present

## 2018-10-12 DIAGNOSIS — G20 Parkinson's disease: Secondary | ICD-10-CM | POA: Diagnosis not present

## 2018-10-12 DIAGNOSIS — M179 Osteoarthritis of knee, unspecified: Secondary | ICD-10-CM | POA: Diagnosis not present

## 2018-10-12 DIAGNOSIS — M6281 Muscle weakness (generalized): Secondary | ICD-10-CM | POA: Diagnosis not present

## 2018-10-13 DIAGNOSIS — M179 Osteoarthritis of knee, unspecified: Secondary | ICD-10-CM | POA: Diagnosis not present

## 2018-10-13 DIAGNOSIS — M545 Low back pain: Secondary | ICD-10-CM | POA: Diagnosis not present

## 2018-10-13 DIAGNOSIS — M797 Fibromyalgia: Secondary | ICD-10-CM | POA: Diagnosis not present

## 2018-10-13 DIAGNOSIS — M6281 Muscle weakness (generalized): Secondary | ICD-10-CM | POA: Diagnosis not present

## 2018-10-13 DIAGNOSIS — G20 Parkinson's disease: Secondary | ICD-10-CM | POA: Diagnosis not present

## 2018-10-15 DIAGNOSIS — M545 Low back pain: Secondary | ICD-10-CM | POA: Diagnosis not present

## 2018-10-15 DIAGNOSIS — G20 Parkinson's disease: Secondary | ICD-10-CM | POA: Diagnosis not present

## 2018-10-15 DIAGNOSIS — E785 Hyperlipidemia, unspecified: Secondary | ICD-10-CM | POA: Diagnosis not present

## 2018-10-15 DIAGNOSIS — I10 Essential (primary) hypertension: Secondary | ICD-10-CM | POA: Diagnosis not present

## 2018-10-15 DIAGNOSIS — M797 Fibromyalgia: Secondary | ICD-10-CM | POA: Diagnosis not present

## 2018-10-15 DIAGNOSIS — M6281 Muscle weakness (generalized): Secondary | ICD-10-CM | POA: Diagnosis not present

## 2018-10-15 DIAGNOSIS — M179 Osteoarthritis of knee, unspecified: Secondary | ICD-10-CM | POA: Diagnosis not present

## 2018-10-15 DIAGNOSIS — J45909 Unspecified asthma, uncomplicated: Secondary | ICD-10-CM | POA: Diagnosis not present

## 2018-10-16 DIAGNOSIS — M6281 Muscle weakness (generalized): Secondary | ICD-10-CM | POA: Diagnosis not present

## 2018-10-16 DIAGNOSIS — M545 Low back pain: Secondary | ICD-10-CM | POA: Diagnosis not present

## 2018-10-16 DIAGNOSIS — M797 Fibromyalgia: Secondary | ICD-10-CM | POA: Diagnosis not present

## 2018-10-16 DIAGNOSIS — M179 Osteoarthritis of knee, unspecified: Secondary | ICD-10-CM | POA: Diagnosis not present

## 2018-10-16 DIAGNOSIS — G20 Parkinson's disease: Secondary | ICD-10-CM | POA: Diagnosis not present

## 2018-10-18 DIAGNOSIS — M179 Osteoarthritis of knee, unspecified: Secondary | ICD-10-CM | POA: Diagnosis not present

## 2018-10-18 DIAGNOSIS — M797 Fibromyalgia: Secondary | ICD-10-CM | POA: Diagnosis not present

## 2018-10-18 DIAGNOSIS — M545 Low back pain: Secondary | ICD-10-CM | POA: Diagnosis not present

## 2018-10-18 DIAGNOSIS — M6281 Muscle weakness (generalized): Secondary | ICD-10-CM | POA: Diagnosis not present

## 2018-10-19 DIAGNOSIS — M6281 Muscle weakness (generalized): Secondary | ICD-10-CM | POA: Diagnosis not present

## 2018-10-19 DIAGNOSIS — M797 Fibromyalgia: Secondary | ICD-10-CM | POA: Diagnosis not present

## 2018-10-19 DIAGNOSIS — M545 Low back pain: Secondary | ICD-10-CM | POA: Diagnosis not present

## 2018-10-19 DIAGNOSIS — M179 Osteoarthritis of knee, unspecified: Secondary | ICD-10-CM | POA: Diagnosis not present

## 2018-10-20 DIAGNOSIS — M6281 Muscle weakness (generalized): Secondary | ICD-10-CM | POA: Diagnosis not present

## 2018-10-20 DIAGNOSIS — M545 Low back pain: Secondary | ICD-10-CM | POA: Diagnosis not present

## 2018-10-20 DIAGNOSIS — M179 Osteoarthritis of knee, unspecified: Secondary | ICD-10-CM | POA: Diagnosis not present

## 2018-10-20 DIAGNOSIS — M797 Fibromyalgia: Secondary | ICD-10-CM | POA: Diagnosis not present

## 2018-10-21 DIAGNOSIS — I10 Essential (primary) hypertension: Secondary | ICD-10-CM | POA: Diagnosis not present

## 2018-10-21 DIAGNOSIS — M6281 Muscle weakness (generalized): Secondary | ICD-10-CM | POA: Diagnosis not present

## 2018-10-21 DIAGNOSIS — F419 Anxiety disorder, unspecified: Secondary | ICD-10-CM | POA: Diagnosis not present

## 2018-10-21 DIAGNOSIS — M545 Low back pain: Secondary | ICD-10-CM | POA: Diagnosis not present

## 2018-10-21 DIAGNOSIS — J45909 Unspecified asthma, uncomplicated: Secondary | ICD-10-CM | POA: Diagnosis not present

## 2018-10-21 DIAGNOSIS — M797 Fibromyalgia: Secondary | ICD-10-CM | POA: Diagnosis not present

## 2018-10-21 DIAGNOSIS — G20 Parkinson's disease: Secondary | ICD-10-CM | POA: Diagnosis not present

## 2018-10-21 DIAGNOSIS — E785 Hyperlipidemia, unspecified: Secondary | ICD-10-CM | POA: Diagnosis not present

## 2018-10-21 DIAGNOSIS — M179 Osteoarthritis of knee, unspecified: Secondary | ICD-10-CM | POA: Diagnosis not present

## 2018-10-21 DIAGNOSIS — F339 Major depressive disorder, recurrent, unspecified: Secondary | ICD-10-CM | POA: Diagnosis not present

## 2018-10-25 DIAGNOSIS — Z79899 Other long term (current) drug therapy: Secondary | ICD-10-CM | POA: Diagnosis not present

## 2018-10-25 DIAGNOSIS — D649 Anemia, unspecified: Secondary | ICD-10-CM | POA: Diagnosis not present

## 2018-10-25 DIAGNOSIS — E785 Hyperlipidemia, unspecified: Secondary | ICD-10-CM | POA: Diagnosis not present

## 2018-10-25 DIAGNOSIS — Z5181 Encounter for therapeutic drug level monitoring: Secondary | ICD-10-CM | POA: Diagnosis not present

## 2018-10-25 DIAGNOSIS — E559 Vitamin D deficiency, unspecified: Secondary | ICD-10-CM | POA: Diagnosis not present

## 2018-10-25 DIAGNOSIS — E039 Hypothyroidism, unspecified: Secondary | ICD-10-CM | POA: Diagnosis not present

## 2018-10-25 DIAGNOSIS — E119 Type 2 diabetes mellitus without complications: Secondary | ICD-10-CM | POA: Diagnosis not present

## 2018-10-27 DIAGNOSIS — F419 Anxiety disorder, unspecified: Secondary | ICD-10-CM | POA: Diagnosis not present

## 2018-10-27 DIAGNOSIS — F339 Major depressive disorder, recurrent, unspecified: Secondary | ICD-10-CM | POA: Diagnosis not present

## 2018-11-01 DIAGNOSIS — F419 Anxiety disorder, unspecified: Secondary | ICD-10-CM | POA: Diagnosis not present

## 2018-11-01 DIAGNOSIS — F339 Major depressive disorder, recurrent, unspecified: Secondary | ICD-10-CM | POA: Diagnosis not present

## 2018-11-02 DIAGNOSIS — F5105 Insomnia due to other mental disorder: Secondary | ICD-10-CM | POA: Diagnosis not present

## 2018-11-02 DIAGNOSIS — F250 Schizoaffective disorder, bipolar type: Secondary | ICD-10-CM | POA: Diagnosis not present

## 2018-11-02 DIAGNOSIS — F064 Anxiety disorder due to known physiological condition: Secondary | ICD-10-CM | POA: Diagnosis not present

## 2018-11-02 DIAGNOSIS — F331 Major depressive disorder, recurrent, moderate: Secondary | ICD-10-CM | POA: Diagnosis not present

## 2018-11-02 DIAGNOSIS — G3183 Dementia with Lewy bodies: Secondary | ICD-10-CM | POA: Diagnosis not present

## 2018-11-02 DIAGNOSIS — F0281 Dementia in other diseases classified elsewhere with behavioral disturbance: Secondary | ICD-10-CM | POA: Diagnosis not present

## 2018-11-15 DIAGNOSIS — I10 Essential (primary) hypertension: Secondary | ICD-10-CM | POA: Diagnosis not present

## 2018-11-15 DIAGNOSIS — J45909 Unspecified asthma, uncomplicated: Secondary | ICD-10-CM | POA: Diagnosis not present

## 2018-11-15 DIAGNOSIS — E785 Hyperlipidemia, unspecified: Secondary | ICD-10-CM | POA: Diagnosis not present

## 2018-11-15 DIAGNOSIS — G20 Parkinson's disease: Secondary | ICD-10-CM | POA: Diagnosis not present

## 2018-11-18 DIAGNOSIS — F339 Major depressive disorder, recurrent, unspecified: Secondary | ICD-10-CM | POA: Diagnosis not present

## 2018-11-18 DIAGNOSIS — F419 Anxiety disorder, unspecified: Secondary | ICD-10-CM | POA: Diagnosis not present

## 2018-11-18 DIAGNOSIS — E785 Hyperlipidemia, unspecified: Secondary | ICD-10-CM | POA: Diagnosis not present

## 2018-11-18 DIAGNOSIS — G20 Parkinson's disease: Secondary | ICD-10-CM | POA: Diagnosis not present

## 2018-11-18 DIAGNOSIS — I10 Essential (primary) hypertension: Secondary | ICD-10-CM | POA: Diagnosis not present

## 2018-11-18 DIAGNOSIS — G3183 Dementia with Lewy bodies: Secondary | ICD-10-CM | POA: Diagnosis not present

## 2018-11-22 DIAGNOSIS — F339 Major depressive disorder, recurrent, unspecified: Secondary | ICD-10-CM | POA: Diagnosis not present

## 2018-11-22 DIAGNOSIS — F419 Anxiety disorder, unspecified: Secondary | ICD-10-CM | POA: Diagnosis not present

## 2018-11-25 DIAGNOSIS — G3183 Dementia with Lewy bodies: Secondary | ICD-10-CM | POA: Diagnosis not present

## 2018-11-25 DIAGNOSIS — F331 Major depressive disorder, recurrent, moderate: Secondary | ICD-10-CM | POA: Diagnosis not present

## 2018-11-25 DIAGNOSIS — F5105 Insomnia due to other mental disorder: Secondary | ICD-10-CM | POA: Diagnosis not present

## 2018-11-25 DIAGNOSIS — F250 Schizoaffective disorder, bipolar type: Secondary | ICD-10-CM | POA: Diagnosis not present

## 2018-11-25 DIAGNOSIS — F0281 Dementia in other diseases classified elsewhere with behavioral disturbance: Secondary | ICD-10-CM | POA: Diagnosis not present

## 2018-11-25 DIAGNOSIS — F064 Anxiety disorder due to known physiological condition: Secondary | ICD-10-CM | POA: Diagnosis not present

## 2018-12-02 DIAGNOSIS — F331 Major depressive disorder, recurrent, moderate: Secondary | ICD-10-CM | POA: Diagnosis not present

## 2018-12-02 DIAGNOSIS — F339 Major depressive disorder, recurrent, unspecified: Secondary | ICD-10-CM | POA: Diagnosis not present

## 2018-12-02 DIAGNOSIS — F064 Anxiety disorder due to known physiological condition: Secondary | ICD-10-CM | POA: Diagnosis not present

## 2018-12-02 DIAGNOSIS — F0281 Dementia in other diseases classified elsewhere with behavioral disturbance: Secondary | ICD-10-CM | POA: Diagnosis not present

## 2018-12-02 DIAGNOSIS — F419 Anxiety disorder, unspecified: Secondary | ICD-10-CM | POA: Diagnosis not present

## 2018-12-02 DIAGNOSIS — G3183 Dementia with Lewy bodies: Secondary | ICD-10-CM | POA: Diagnosis not present

## 2018-12-02 DIAGNOSIS — F250 Schizoaffective disorder, bipolar type: Secondary | ICD-10-CM | POA: Diagnosis not present

## 2018-12-02 DIAGNOSIS — F5105 Insomnia due to other mental disorder: Secondary | ICD-10-CM | POA: Diagnosis not present

## 2018-12-09 DIAGNOSIS — F250 Schizoaffective disorder, bipolar type: Secondary | ICD-10-CM | POA: Diagnosis not present

## 2018-12-09 DIAGNOSIS — F419 Anxiety disorder, unspecified: Secondary | ICD-10-CM | POA: Diagnosis not present

## 2018-12-09 DIAGNOSIS — G3183 Dementia with Lewy bodies: Secondary | ICD-10-CM | POA: Diagnosis not present

## 2018-12-09 DIAGNOSIS — F5105 Insomnia due to other mental disorder: Secondary | ICD-10-CM | POA: Diagnosis not present

## 2018-12-09 DIAGNOSIS — F331 Major depressive disorder, recurrent, moderate: Secondary | ICD-10-CM | POA: Diagnosis not present

## 2018-12-09 DIAGNOSIS — F339 Major depressive disorder, recurrent, unspecified: Secondary | ICD-10-CM | POA: Diagnosis not present

## 2018-12-09 DIAGNOSIS — F064 Anxiety disorder due to known physiological condition: Secondary | ICD-10-CM | POA: Diagnosis not present

## 2018-12-09 DIAGNOSIS — F0281 Dementia in other diseases classified elsewhere with behavioral disturbance: Secondary | ICD-10-CM | POA: Diagnosis not present

## 2018-12-10 DIAGNOSIS — N390 Urinary tract infection, site not specified: Secondary | ICD-10-CM | POA: Diagnosis not present

## 2018-12-10 DIAGNOSIS — B1710 Acute hepatitis C without hepatic coma: Secondary | ICD-10-CM | POA: Diagnosis not present

## 2018-12-10 DIAGNOSIS — E119 Type 2 diabetes mellitus without complications: Secondary | ICD-10-CM | POA: Diagnosis not present

## 2018-12-10 DIAGNOSIS — Z79899 Other long term (current) drug therapy: Secondary | ICD-10-CM | POA: Diagnosis not present

## 2018-12-12 DIAGNOSIS — B1710 Acute hepatitis C without hepatic coma: Secondary | ICD-10-CM | POA: Diagnosis not present

## 2018-12-13 DIAGNOSIS — J45909 Unspecified asthma, uncomplicated: Secondary | ICD-10-CM | POA: Diagnosis not present

## 2018-12-13 DIAGNOSIS — G20 Parkinson's disease: Secondary | ICD-10-CM | POA: Diagnosis not present

## 2018-12-13 DIAGNOSIS — F419 Anxiety disorder, unspecified: Secondary | ICD-10-CM | POA: Diagnosis not present

## 2018-12-13 DIAGNOSIS — F339 Major depressive disorder, recurrent, unspecified: Secondary | ICD-10-CM | POA: Diagnosis not present

## 2018-12-13 DIAGNOSIS — E785 Hyperlipidemia, unspecified: Secondary | ICD-10-CM | POA: Diagnosis not present

## 2018-12-13 DIAGNOSIS — I10 Essential (primary) hypertension: Secondary | ICD-10-CM | POA: Diagnosis not present

## 2018-12-16 DIAGNOSIS — G20 Parkinson's disease: Secondary | ICD-10-CM | POA: Diagnosis not present

## 2018-12-16 DIAGNOSIS — E785 Hyperlipidemia, unspecified: Secondary | ICD-10-CM | POA: Diagnosis not present

## 2018-12-16 DIAGNOSIS — F331 Major depressive disorder, recurrent, moderate: Secondary | ICD-10-CM | POA: Diagnosis not present

## 2018-12-16 DIAGNOSIS — F0281 Dementia in other diseases classified elsewhere with behavioral disturbance: Secondary | ICD-10-CM | POA: Diagnosis not present

## 2018-12-16 DIAGNOSIS — F250 Schizoaffective disorder, bipolar type: Secondary | ICD-10-CM | POA: Diagnosis not present

## 2018-12-16 DIAGNOSIS — I10 Essential (primary) hypertension: Secondary | ICD-10-CM | POA: Diagnosis not present

## 2018-12-16 DIAGNOSIS — J45909 Unspecified asthma, uncomplicated: Secondary | ICD-10-CM | POA: Diagnosis not present

## 2018-12-16 DIAGNOSIS — F5105 Insomnia due to other mental disorder: Secondary | ICD-10-CM | POA: Diagnosis not present

## 2018-12-16 DIAGNOSIS — F064 Anxiety disorder due to known physiological condition: Secondary | ICD-10-CM | POA: Diagnosis not present

## 2018-12-16 DIAGNOSIS — G3183 Dementia with Lewy bodies: Secondary | ICD-10-CM | POA: Diagnosis not present

## 2018-12-17 ENCOUNTER — Emergency Department (HOSPITAL_COMMUNITY)
Admission: EM | Admit: 2018-12-17 | Discharge: 2018-12-17 | Disposition: A | Discharge status: Home / Self Care | Payer: Medicare Other | Attending: Emergency Medicine | Admitting: Emergency Medicine

## 2018-12-17 ENCOUNTER — Emergency Department (HOSPITAL_COMMUNITY): Payer: Medicare Other

## 2018-12-17 ENCOUNTER — Encounter (HOSPITAL_COMMUNITY): Payer: Self-pay

## 2018-12-17 ENCOUNTER — Other Ambulatory Visit: Payer: Self-pay

## 2018-12-17 DIAGNOSIS — J45909 Unspecified asthma, uncomplicated: Secondary | ICD-10-CM | POA: Diagnosis not present

## 2018-12-17 DIAGNOSIS — F0280 Dementia in other diseases classified elsewhere without behavioral disturbance: Secondary | ICD-10-CM | POA: Insufficient documentation

## 2018-12-17 DIAGNOSIS — Y92129 Unspecified place in nursing home as the place of occurrence of the external cause: Secondary | ICD-10-CM

## 2018-12-17 DIAGNOSIS — Z7401 Bed confinement status: Secondary | ICD-10-CM | POA: Diagnosis not present

## 2018-12-17 DIAGNOSIS — R4182 Altered mental status, unspecified: Secondary | ICD-10-CM | POA: Diagnosis not present

## 2018-12-17 DIAGNOSIS — G308 Other Alzheimer's disease: Secondary | ICD-10-CM | POA: Diagnosis not present

## 2018-12-17 DIAGNOSIS — Z7982 Long term (current) use of aspirin: Secondary | ICD-10-CM | POA: Insufficient documentation

## 2018-12-17 DIAGNOSIS — G8929 Other chronic pain: Secondary | ICD-10-CM | POA: Diagnosis not present

## 2018-12-17 DIAGNOSIS — R296 Repeated falls: Secondary | ICD-10-CM | POA: Diagnosis not present

## 2018-12-17 DIAGNOSIS — M2550 Pain in unspecified joint: Secondary | ICD-10-CM | POA: Diagnosis not present

## 2018-12-17 DIAGNOSIS — S0990XA Unspecified injury of head, initial encounter: Secondary | ICD-10-CM | POA: Diagnosis not present

## 2018-12-17 DIAGNOSIS — F331 Major depressive disorder, recurrent, moderate: Secondary | ICD-10-CM | POA: Diagnosis not present

## 2018-12-17 DIAGNOSIS — R531 Weakness: Secondary | ICD-10-CM | POA: Diagnosis not present

## 2018-12-17 DIAGNOSIS — S199XXA Unspecified injury of neck, initial encounter: Secondary | ICD-10-CM | POA: Diagnosis not present

## 2018-12-17 DIAGNOSIS — I10 Essential (primary) hypertension: Secondary | ICD-10-CM | POA: Diagnosis not present

## 2018-12-17 DIAGNOSIS — R52 Pain, unspecified: Secondary | ICD-10-CM | POA: Diagnosis not present

## 2018-12-17 DIAGNOSIS — E785 Hyperlipidemia, unspecified: Secondary | ICD-10-CM | POA: Diagnosis not present

## 2018-12-17 DIAGNOSIS — W19XXXA Unspecified fall, initial encounter: Secondary | ICD-10-CM | POA: Insufficient documentation

## 2018-12-17 DIAGNOSIS — G20 Parkinson's disease: Secondary | ICD-10-CM | POA: Diagnosis not present

## 2018-12-17 DIAGNOSIS — R410 Disorientation, unspecified: Secondary | ICD-10-CM | POA: Diagnosis not present

## 2018-12-17 DIAGNOSIS — M549 Dorsalgia, unspecified: Secondary | ICD-10-CM | POA: Diagnosis not present

## 2018-12-17 DIAGNOSIS — M542 Cervicalgia: Secondary | ICD-10-CM | POA: Diagnosis not present

## 2018-12-17 DIAGNOSIS — Z79899 Other long term (current) drug therapy: Secondary | ICD-10-CM | POA: Insufficient documentation

## 2018-12-17 DIAGNOSIS — F0281 Dementia in other diseases classified elsewhere with behavioral disturbance: Secondary | ICD-10-CM | POA: Diagnosis not present

## 2018-12-17 DIAGNOSIS — G3183 Dementia with Lewy bodies: Secondary | ICD-10-CM | POA: Diagnosis not present

## 2018-12-17 DIAGNOSIS — R404 Transient alteration of awareness: Secondary | ICD-10-CM | POA: Diagnosis not present

## 2018-12-17 DIAGNOSIS — F064 Anxiety disorder due to known physiological condition: Secondary | ICD-10-CM | POA: Diagnosis not present

## 2018-12-17 LAB — CBC WITH DIFFERENTIAL/PLATELET
Abs Immature Granulocytes: 0.04 10*3/uL (ref 0.00–0.07)
Basophils Absolute: 0.1 10*3/uL (ref 0.0–0.1)
Basophils Relative: 1 %
EOS ABS: 0.2 10*3/uL (ref 0.0–0.5)
Eosinophils Relative: 3 %
HEMATOCRIT: 39 % (ref 36.0–46.0)
Hemoglobin: 12.9 g/dL (ref 12.0–15.0)
Immature Granulocytes: 0 %
Lymphocytes Relative: 26 %
Lymphs Abs: 2.3 10*3/uL (ref 0.7–4.0)
MCH: 30.9 pg (ref 26.0–34.0)
MCHC: 33.1 g/dL (ref 30.0–36.0)
MCV: 93.5 fL (ref 80.0–100.0)
Monocytes Absolute: 0.6 10*3/uL (ref 0.1–1.0)
Monocytes Relative: 6 %
Neutro Abs: 5.7 10*3/uL (ref 1.7–7.7)
Neutrophils Relative %: 64 %
Platelets: 271 10*3/uL (ref 150–400)
RBC: 4.17 MIL/uL (ref 3.87–5.11)
RDW: 12.4 % (ref 11.5–15.5)
WBC: 8.9 10*3/uL (ref 4.0–10.5)
nRBC: 0 % (ref 0.0–0.2)

## 2018-12-17 LAB — COMPREHENSIVE METABOLIC PANEL
ALK PHOS: 77 U/L (ref 38–126)
ALT: 9 U/L (ref 0–44)
AST: 17 U/L (ref 15–41)
Albumin: 4.4 g/dL (ref 3.5–5.0)
Anion gap: 8 (ref 5–15)
BUN: 25 mg/dL — ABNORMAL HIGH (ref 8–23)
CO2: 27 mmol/L (ref 22–32)
Calcium: 9.3 mg/dL (ref 8.9–10.3)
Chloride: 104 mmol/L (ref 98–111)
Creatinine, Ser: 1.14 mg/dL — ABNORMAL HIGH (ref 0.44–1.00)
GFR calc non Af Amer: 47 mL/min — ABNORMAL LOW (ref >60)
GFR, EST AFRICAN AMERICAN: 55 mL/min — AB (ref >60)
Glucose, Bld: 98 mg/dL (ref 70–99)
Potassium: 3.4 mmol/L — ABNORMAL LOW (ref 3.5–5.1)
SODIUM: 139 mmol/L (ref 135–145)
Total Bilirubin: 0.6 mg/dL (ref 0.3–1.2)
Total Protein: 7.3 g/dL (ref 6.5–8.1)

## 2018-12-17 LAB — LITHIUM LEVEL: Lithium Lvl: 0.9 mmol/L (ref 0.60–1.20)

## 2018-12-17 NOTE — ED Provider Notes (Signed)
Received care of patient from Dr. Lynelle Doctor at 8 AM.  Briefly this is a 73 year old female with a history of dementia, Parkinson's who presented with concern for fall from her skilled nursing facility.  CT head and cervical spine were done without acute abnormalities.  It was noted she had a recent increase in her lithium dose, and labs have been ordered to evaluate lithium levels.  If normal, patient may be discharged.  Labs obtained show creatinine of 1.1, do not feel significant enough change to warrant inpatient admission, but feel outpatient monitoring is appropriate.  Lithium level within normal limits.  No other significant abnormalities.  Patient discharged in stable condition with understanding of reasons to return.    Jennifer Monday, MD 12/17/18 [phone]

## 2018-12-17 NOTE — ED Notes (Signed)
Bed: WA20 Expected date:  Expected time:  Means of arrival:  Comments: EMS - Unwitness fall

## 2018-12-17 NOTE — ED Notes (Signed)
Attempted I & O cath, pt resistant and would clamp tightly not allowing cath to be inserted. Pur wick placed.

## 2018-12-17 NOTE — ED Triage Notes (Signed)
Per EMS, patient coming from [place] [facility] with complaints of an unwitnessed fall. Patient reports that she was trying to get up to change her brief when she lost balance. Patient does not remember if she hit head or loss consciousness. Not currently on any blood thinners.   Patient has a history of MS and schizophrenia. Dose of lithium was recently increased.

## 2018-12-17 NOTE — ED Provider Notes (Signed)
[facility]-EMERGENCY DEPT Provider Note   CSN: [id number] Arrival date & time: 12/17/18  0447  Time seen 5:40 AM   History   Chief Complaint Chief Complaint  Patient presents with  . Fall   Level 5 caveat for dementia  HPI Jennifer CuretSherry Maheu is a 73 y.o. female.  HPI patient presents from her nursing facility where she had an unwitnessed fall.  When I talked to the patient she is giving me a very inconsistent history.  She states she has chronic neck and back pain.  At one point she told me it was the same and then she said it was worse and then when I examined her she said it was about the same.  She denies headache.  She denies any new pain in her arms or legs.  PCP Sherron Mondayejan-Sie, S Ahmed, MD   Past Medical History:  Diagnosis Date  . Allergy   . Anxiety   . Arthritis   . Asthma   . Benign essential hypertension 03/18/2017  . Cataract   . Dementia (HCC)   . Depression   . Difficulty walking   . Dysphagia, oral phase   . Fibromyalgia   . GERD (gastroesophageal reflux disease)   . Hallucinations   . Hyperlipidemia   . Hypertension   . Lower extremity edema   . Parkinson disease (HCC)   . Repeated falls   . Rhabdomyolysis   . Stroke (HCC)   . TIA (transient ischemic attack)   . Urinary incontinence     Patient Active Problem List   Diagnosis Date Noted  . Jaundice 07/25/2018  . E-coli UTI 07/25/2018  . Episodic confusion 07/25/2018  . Elevated liver function tests 07/25/2018  . Slow transit constipation 01/06/2018  . Chronic bilateral low back pain without sciatica 10/14/2017  . Gait abnormality 10/14/2017  . Primary osteoarthritis involving multiple joints 06/18/2017  . History of CVA in adulthood 06/18/2017  . Benign essential hypertension 03/18/2017  . Dyslipidemia 03/18/2017  . Osteoarthritis 03/18/2017  . Parkinson disease (HCC) 03/18/2017  . Vascular dementia without behavioral disturbance (HCC) 03/18/2017  . Asthma with allergic  rhinitis without complication 03/18/2017  . Vitamin D deficiency 03/18/2017  . GERD without esophagitis 03/18/2017  . Recurrent major depression (HCC) 03/18/2017  . Dysphagia, oral phase 03/18/2017  . TIA (transient ischemic attack) 03/18/2017    Past Surgical History:  Procedure Laterality Date  . ABDOMINAL HYSTERECTOMY    . CATARACT EXTRACTION    . CHOLECYSTECTOMY    . TONSILLECTOMY AND ADENOIDECTOMY       OB History   No obstetric history on file.      Home Medications    Prior to Admission medications   Medication Sig Start Date End Date Taking? Authorizing Provider  albuterol (PROVENTIL HFA;VENTOLIN HFA) 108 (90 Base) MCG/ACT inhaler Inhale 2 puffs into the lungs every 6 (six) hours as needed for wheezing or shortness of breath.   Yes [provider]  aluminum-magnesium hydroxide-simethicone (MAALOX) 200-200-20 MG/5ML SUSP Take 30 mLs by mouth every 6 (six) hours as needed. Constipation   Yes [provider]  amLODipine (NORVASC) 10 MG tablet Take 10 mg by mouth daily.   Yes [provider]  aspirin EC 81 MG tablet Take 81 mg by mouth daily.   Yes [provider]  bisacodyl (BISACODYL LAXATIVE) 10 MG suppository Place 10 mg rectally as needed for moderate constipation. 08/12/18  Yes [provider]  busPIRone (BUSPAR) 5 MG tablet Take 15 mg  by mouth 2 (two) times daily.    Yes [provider]  calcium carbonate (TUMS - DOSED IN MG ELEMENTAL CALCIUM) 500 MG chewable tablet Chew 1 tablet by mouth every 6 (six) hours as needed for indigestion or heartburn.   Yes [provider]  carbidopa-levodopa (SINEMET IR) 25-100 MG tablet Take 1 tablet by mouth 3 (three) times daily.    Yes [provider]  cholecalciferol (VITAMIN D) 1000 units tablet Take 1,000 Units by mouth daily.   Yes [provider]  cyclobenzaprine (FLEXERIL) 10 MG tablet Take 10 mg by mouth at bedtime. 07/28/18  Yes [provider]  diphenhydrAMINE (BENADRYL) 25 MG tablet Take 25 mg by mouth every 8 (eight) hours as needed for itching. 07/23/17  Yes [provider]  donepezil (ARICEPT) 10 MG tablet Take 10 mg by mouth at bedtime.   Yes [provider]  DULoxetine (CYMBALTA) 30 MG capsule Give 3 tablets (90mg ) by mouth daily 03/01/18  Yes [provider]  fluticasone (FLONASE) 50 MCG/ACT nasal spray Place 2 sprays into both nostrils daily.   Yes [provider]  gabapentin (NEURONTIN) 100 MG capsule Take 100 mg by mouth at bedtime. 02/15/18  Yes [provider]  Lidocaine 4 % PTCH Apply 2 patches to lower back topically one time a day every AM and remove every PM for pain management 02/09/18  Yes [provider]  lithium carbonate 150 MG capsule Take 150 mg by mouth at bedtime. 07/02/18  Yes [provider]  loperamide (IMODIUM) 2 MG capsule Take 2 mg by mouth every 6 (six) hours as needed for diarrhea or loose stools. 02/15/18  Yes [provider]  loratadine (CLARITIN) 10 MG tablet Take 10 mg by mouth daily as needed for allergies.    Yes [provider]  LORazepam (ATIVAN) 1 MG tablet Take 1 tablet (1 mg total) by mouth every 8 (eight) hours. 09/06/18  Yes Kirt Boysarter, Monica, DO  Melatonin 3 MG TABS Take 1 tablet by mouth at bedtime.  03/25/18  Yes [provider]  meloxicam (MOBIC) 15 MG tablet Take 15 mg by mouth daily.  07/10/18  Yes [provider]  memantine (NAMENDA) 10 MG tablet Take 10 mg by mouth 2 (two) times daily.   Yes [provider]  Menthol, Topical Analgesic, (BIOFREEZE) 4 % GEL Apply 1 application topically 2 (two) times daily. For posterior neck pain and bilateral knees   Yes [provider]  methocarbamol (ROBAXIN) 500 MG tablet Take 500 mg by mouth at bedtime. 05/28/18  Yes [provider]  pantoprazole (PROTONIX) 40 MG tablet Take 40 mg by mouth daily.   Yes [provider]    polyethylene glycol (MIRALAX / GLYCOLAX) packet Take 17 g by mouth daily as needed.    Yes [provider]  traMADol (ULTRAM) 50 MG tablet Take 1 tablet (50 mg total) by mouth every 8 (eight) hours. 09/06/18  Yes Montez Moritaarter, Monica, DO  ziprasidone (GEODON) 20 MG capsule Take 60 mg by mouth 2 (two) times daily with a meal.  06/20/18  Yes [provider]  Nutritional Supplements (NUTRITIONAL SUPPLEMENT PO) Regular Diet - Regular Texture    [provider]  ondansetron (ZOFRAN) 4 MG tablet Take 4 mg by mouth every 6 (six) hours as needed for nausea or vomiting.    [provider]    Family History Family History  Problem Relation Age of Onset  . Non-Hodgkin's lymphoma Mother   .  Stroke Mother   . Heart attack Father     Social History Social History   Tobacco Use  . Smoking status: Never Smoker  . Smokeless tobacco: Never Used  Substance Use Topics  . Alcohol use: No  . Drug use: No  Lives in a nursing home   Allergies   Hydrocodone   Review of Systems Review of Systems  Unable to perform ROS: Dementia     Physical Exam Updated Vital Signs BP (!) 123/58   Pulse 76   Temp 97.7 F (36.5 C)   Resp 17   SpO2 97%   Vital signs normal    Physical Exam Vitals signs and nursing note reviewed.  Constitutional:      General: She is not in acute distress.    Appearance: Normal appearance. She is well-developed. She is not ill-appearing or toxic-appearing.  HENT:     Head: Normocephalic and atraumatic.     Right Ear: External ear normal.     Left Ear: External ear normal.     Nose: Nose normal. No mucosal edema or rhinorrhea.     Mouth/Throat:     Mouth: Mucous membranes are moist.     Dentition: No dental abscesses.     Pharynx: No uvula swelling.  Eyes:     Conjunctiva/sclera: Conjunctivae normal.     Pupils: Pupils are equal, round, and reactive to light.  Neck:     Musculoskeletal: Full passive range of motion without pain.      Comments: Patient is complaining about her c-collar Cardiovascular:     Rate and Rhythm: Normal rate and regular rhythm.     Heart sounds: Normal heart sounds. No murmur. No friction rub. No gallop.   Pulmonary:     Effort: Pulmonary effort is normal. No respiratory distress.     Breath sounds: Normal breath sounds. No wheezing, rhonchi or rales.  Chest:     Chest wall: No tenderness or crepitus.  Abdominal:     General: Bowel sounds are normal. There is no distension.     Palpations: Abdomen is soft.     Tenderness: There is no abdominal tenderness. There is no guarding or rebound.  Musculoskeletal: Normal range of motion.        General: No tenderness.     Comments: Patient has no obvious deformity of her arms or legs.  She moves her arms well without pain.  She has some tremor in her feet when she tries to move them and her legs are very stiff.  However she has no focal pain in her legs.  When I palpate her spine she states it does not hurt when I palpate it.  Skin:    General: Skin is warm and dry.     Coloration: Skin is not pale.     Findings: No erythema or rash.     Comments: No bruising seen  Neurological:     General: No focal deficit present.     Mental Status: She is alert.     Cranial Nerves: No cranial nerve deficit.  Psychiatric:        Mood and Affect: Affect is flat.        Speech: Speech is delayed.        Behavior: Behavior is slowed.      ED Treatments / Results  Labs (all labs ordered are listed, but only abnormal results are displayed) Labs Reviewed  LITHIUM LEVEL  COMPREHENSIVE METABOLIC PANEL  CBC WITH DIFFERENTIAL/PLATELET  URINALYSIS, ROUTINE W REFLEX MICROSCOPIC    EKG None  Radiology Ct Head Wo Contrast Ct Cervical Spine Wo Contrast  Result Date: 12/17/2018 CLINICAL DATA:  Unwitnessed fall. EXAM: CT HEAD WITHOUT CONTRAST CT CERVICAL SPINE WITHOUT CONTRAST TECHNIQUE: Multidetector CT imaging of the head and cervical spine was performed  following the standard protocol without intravenous contrast. Multiplanar CT image reconstructions of the cervical spine were also generated. COMPARISON:  CT scan of July 28, 2018. FINDINGS: CT HEAD FINDINGS Brain: Mild diffuse cortical atrophy is noted. Mild chronic ischemic white matter disease is noted. No mass effect or midline shift is noted. Ventricular size is within normal limits. There is no evidence of mass lesion, hemorrhage or acute infarction. Vascular: No hyperdense vessel or unexpected calcification. Skull: Normal. Negative for fracture or focal lesion. Sinuses/Orbits: No acute finding. Other: None. CT CERVICAL SPINE FINDINGS Alignment: Normal. Skull base and vertebrae: No acute fracture. No primary bone lesion or focal pathologic process. Soft tissues and spinal canal: No prevertebral fluid or swelling. No visible canal hematoma. Disc levels: Moderate degenerative disc disease is noted at C5-6 with posterior osteophyte formation. Upper chest: Negative. Other: Mild degenerative changes seen involving the posterior facet joints on the right. IMPRESSION: Mild diffuse cortical atrophy. Mild chronic ischemic white matter disease. No acute intracranial abnormality seen. Moderate degenerative disc disease is noted at C5-6. No acute abnormality seen in the cervical spine Electronically Signed   By: Lupita Raider, M.D.   On: 12/17/2018 07:32    Procedures Procedures (including critical care time)  Medications Ordered in ED Medications - No data to display   Initial Impression / Assessment and Plan / ED Course  I have reviewed the triage vital signs and the nursing notes.  Pertinent labs & imaging results that were available during my care of the patient were reviewed by me and considered in my medical decision making (see chart for details).     CT of head and cervical spine was done just because she had an unwitnessed fall and she is a very unreliable historian.  Patient CT scans do  not show any acute changes.  I was going to send patient home however then looked at the nursing notes again and they state the nursing home recently increased her lithium level.  That will be checked to make sure that is not why she fell easier.  Patient left a change of shift with Dr. Dalene Seltzer to check her laboratory test results.  Final Clinical Impressions(s) / ED Diagnoses   Final diagnoses:  Fall at nursing home, initial encounter    ED Discharge Orders    None     Disposition pending  Devoria Albe, MD, Concha Pyo, MD 12/17/18 [phone]

## 2018-12-17 NOTE — ED Notes (Signed)
Attempted to call report to facility, no one would answer phone

## 2018-12-17 NOTE — ED Notes (Signed)
[facility] present to transport pt back to facility 

## 2018-12-17 NOTE — ED Notes (Signed)
[facility] has been called to transport pt to facility. 

## 2018-12-21 DIAGNOSIS — Z741 Need for assistance with personal care: Secondary | ICD-10-CM | POA: Diagnosis not present

## 2018-12-21 DIAGNOSIS — R531 Weakness: Secondary | ICD-10-CM | POA: Diagnosis not present

## 2018-12-21 DIAGNOSIS — R262 Difficulty in walking, not elsewhere classified: Secondary | ICD-10-CM | POA: Diagnosis not present

## 2018-12-21 DIAGNOSIS — R278 Other lack of coordination: Secondary | ICD-10-CM | POA: Diagnosis not present

## 2018-12-21 DIAGNOSIS — M6281 Muscle weakness (generalized): Secondary | ICD-10-CM | POA: Diagnosis not present

## 2018-12-21 DIAGNOSIS — G20 Parkinson's disease: Secondary | ICD-10-CM | POA: Diagnosis not present

## 2018-12-22 DIAGNOSIS — R531 Weakness: Secondary | ICD-10-CM | POA: Diagnosis not present

## 2018-12-22 DIAGNOSIS — G20 Parkinson's disease: Secondary | ICD-10-CM | POA: Diagnosis not present

## 2018-12-22 DIAGNOSIS — Z741 Need for assistance with personal care: Secondary | ICD-10-CM | POA: Diagnosis not present

## 2018-12-22 DIAGNOSIS — R262 Difficulty in walking, not elsewhere classified: Secondary | ICD-10-CM | POA: Diagnosis not present

## 2018-12-22 DIAGNOSIS — R278 Other lack of coordination: Secondary | ICD-10-CM | POA: Diagnosis not present

## 2018-12-22 DIAGNOSIS — M6281 Muscle weakness (generalized): Secondary | ICD-10-CM | POA: Diagnosis not present

## 2018-12-23 DIAGNOSIS — R278 Other lack of coordination: Secondary | ICD-10-CM | POA: Diagnosis not present

## 2018-12-23 DIAGNOSIS — M6281 Muscle weakness (generalized): Secondary | ICD-10-CM | POA: Diagnosis not present

## 2018-12-23 DIAGNOSIS — G20 Parkinson's disease: Secondary | ICD-10-CM | POA: Diagnosis not present

## 2018-12-23 DIAGNOSIS — R262 Difficulty in walking, not elsewhere classified: Secondary | ICD-10-CM | POA: Diagnosis not present

## 2018-12-23 DIAGNOSIS — Z741 Need for assistance with personal care: Secondary | ICD-10-CM | POA: Diagnosis not present

## 2018-12-23 DIAGNOSIS — R531 Weakness: Secondary | ICD-10-CM | POA: Diagnosis not present

## 2018-12-24 DIAGNOSIS — R262 Difficulty in walking, not elsewhere classified: Secondary | ICD-10-CM | POA: Diagnosis not present

## 2018-12-24 DIAGNOSIS — R531 Weakness: Secondary | ICD-10-CM | POA: Diagnosis not present

## 2018-12-24 DIAGNOSIS — Z741 Need for assistance with personal care: Secondary | ICD-10-CM | POA: Diagnosis not present

## 2018-12-24 DIAGNOSIS — G20 Parkinson's disease: Secondary | ICD-10-CM | POA: Diagnosis not present

## 2018-12-24 DIAGNOSIS — R278 Other lack of coordination: Secondary | ICD-10-CM | POA: Diagnosis not present

## 2018-12-24 DIAGNOSIS — M6281 Muscle weakness (generalized): Secondary | ICD-10-CM | POA: Diagnosis not present

## 2018-12-27 DIAGNOSIS — H04123 Dry eye syndrome of bilateral lacrimal glands: Secondary | ICD-10-CM | POA: Diagnosis not present

## 2018-12-27 DIAGNOSIS — Z741 Need for assistance with personal care: Secondary | ICD-10-CM | POA: Diagnosis not present

## 2018-12-27 DIAGNOSIS — R278 Other lack of coordination: Secondary | ICD-10-CM | POA: Diagnosis not present

## 2018-12-27 DIAGNOSIS — R262 Difficulty in walking, not elsewhere classified: Secondary | ICD-10-CM | POA: Diagnosis not present

## 2018-12-27 DIAGNOSIS — G20 Parkinson's disease: Secondary | ICD-10-CM | POA: Diagnosis not present

## 2018-12-27 DIAGNOSIS — Z961 Presence of intraocular lens: Secondary | ICD-10-CM | POA: Diagnosis not present

## 2018-12-27 DIAGNOSIS — R531 Weakness: Secondary | ICD-10-CM | POA: Diagnosis not present

## 2018-12-27 DIAGNOSIS — M6281 Muscle weakness (generalized): Secondary | ICD-10-CM | POA: Diagnosis not present

## 2018-12-28 DIAGNOSIS — F339 Major depressive disorder, recurrent, unspecified: Secondary | ICD-10-CM | POA: Diagnosis not present

## 2018-12-28 DIAGNOSIS — Z741 Need for assistance with personal care: Secondary | ICD-10-CM | POA: Diagnosis not present

## 2018-12-28 DIAGNOSIS — R531 Weakness: Secondary | ICD-10-CM | POA: Diagnosis not present

## 2018-12-28 DIAGNOSIS — F419 Anxiety disorder, unspecified: Secondary | ICD-10-CM | POA: Diagnosis not present

## 2018-12-28 DIAGNOSIS — R278 Other lack of coordination: Secondary | ICD-10-CM | POA: Diagnosis not present

## 2018-12-28 DIAGNOSIS — G20 Parkinson's disease: Secondary | ICD-10-CM | POA: Diagnosis not present

## 2018-12-28 DIAGNOSIS — M6281 Muscle weakness (generalized): Secondary | ICD-10-CM | POA: Diagnosis not present

## 2018-12-28 DIAGNOSIS — R262 Difficulty in walking, not elsewhere classified: Secondary | ICD-10-CM | POA: Diagnosis not present

## 2018-12-29 DIAGNOSIS — R278 Other lack of coordination: Secondary | ICD-10-CM | POA: Diagnosis not present

## 2018-12-29 DIAGNOSIS — Z741 Need for assistance with personal care: Secondary | ICD-10-CM | POA: Diagnosis not present

## 2018-12-29 DIAGNOSIS — R531 Weakness: Secondary | ICD-10-CM | POA: Diagnosis not present

## 2018-12-29 DIAGNOSIS — M6281 Muscle weakness (generalized): Secondary | ICD-10-CM | POA: Diagnosis not present

## 2018-12-29 DIAGNOSIS — R262 Difficulty in walking, not elsewhere classified: Secondary | ICD-10-CM | POA: Diagnosis not present

## 2018-12-29 DIAGNOSIS — G20 Parkinson's disease: Secondary | ICD-10-CM | POA: Diagnosis not present

## 2018-12-30 DIAGNOSIS — G20 Parkinson's disease: Secondary | ICD-10-CM | POA: Diagnosis not present

## 2018-12-30 DIAGNOSIS — R278 Other lack of coordination: Secondary | ICD-10-CM | POA: Diagnosis not present

## 2018-12-30 DIAGNOSIS — R262 Difficulty in walking, not elsewhere classified: Secondary | ICD-10-CM | POA: Diagnosis not present

## 2018-12-30 DIAGNOSIS — R531 Weakness: Secondary | ICD-10-CM | POA: Diagnosis not present

## 2018-12-30 DIAGNOSIS — M6281 Muscle weakness (generalized): Secondary | ICD-10-CM | POA: Diagnosis not present

## 2018-12-30 DIAGNOSIS — Z741 Need for assistance with personal care: Secondary | ICD-10-CM | POA: Diagnosis not present

## 2018-12-31 DIAGNOSIS — Z741 Need for assistance with personal care: Secondary | ICD-10-CM | POA: Diagnosis not present

## 2018-12-31 DIAGNOSIS — R262 Difficulty in walking, not elsewhere classified: Secondary | ICD-10-CM | POA: Diagnosis not present

## 2018-12-31 DIAGNOSIS — G20 Parkinson's disease: Secondary | ICD-10-CM | POA: Diagnosis not present

## 2018-12-31 DIAGNOSIS — R278 Other lack of coordination: Secondary | ICD-10-CM | POA: Diagnosis not present

## 2018-12-31 DIAGNOSIS — R531 Weakness: Secondary | ICD-10-CM | POA: Diagnosis not present

## 2018-12-31 DIAGNOSIS — M6281 Muscle weakness (generalized): Secondary | ICD-10-CM | POA: Diagnosis not present

## 2019-01-01 DIAGNOSIS — R262 Difficulty in walking, not elsewhere classified: Secondary | ICD-10-CM | POA: Diagnosis not present

## 2019-01-01 DIAGNOSIS — Z741 Need for assistance with personal care: Secondary | ICD-10-CM | POA: Diagnosis not present

## 2019-01-01 DIAGNOSIS — M6281 Muscle weakness (generalized): Secondary | ICD-10-CM | POA: Diagnosis not present

## 2019-01-01 DIAGNOSIS — G20 Parkinson's disease: Secondary | ICD-10-CM | POA: Diagnosis not present

## 2019-01-01 DIAGNOSIS — R531 Weakness: Secondary | ICD-10-CM | POA: Diagnosis not present

## 2019-01-01 DIAGNOSIS — R278 Other lack of coordination: Secondary | ICD-10-CM | POA: Diagnosis not present

## 2019-01-03 DIAGNOSIS — G20 Parkinson's disease: Secondary | ICD-10-CM | POA: Diagnosis not present

## 2019-01-03 DIAGNOSIS — Z741 Need for assistance with personal care: Secondary | ICD-10-CM | POA: Diagnosis not present

## 2019-01-03 DIAGNOSIS — R531 Weakness: Secondary | ICD-10-CM | POA: Diagnosis not present

## 2019-01-03 DIAGNOSIS — R278 Other lack of coordination: Secondary | ICD-10-CM | POA: Diagnosis not present

## 2019-01-03 DIAGNOSIS — M6281 Muscle weakness (generalized): Secondary | ICD-10-CM | POA: Diagnosis not present

## 2019-01-03 DIAGNOSIS — R262 Difficulty in walking, not elsewhere classified: Secondary | ICD-10-CM | POA: Diagnosis not present

## 2019-01-04 DIAGNOSIS — G20 Parkinson's disease: Secondary | ICD-10-CM | POA: Diagnosis not present

## 2019-01-04 DIAGNOSIS — R531 Weakness: Secondary | ICD-10-CM | POA: Diagnosis not present

## 2019-01-04 DIAGNOSIS — Z741 Need for assistance with personal care: Secondary | ICD-10-CM | POA: Diagnosis not present

## 2019-01-04 DIAGNOSIS — R262 Difficulty in walking, not elsewhere classified: Secondary | ICD-10-CM | POA: Diagnosis not present

## 2019-01-04 DIAGNOSIS — R278 Other lack of coordination: Secondary | ICD-10-CM | POA: Diagnosis not present

## 2019-01-04 DIAGNOSIS — M6281 Muscle weakness (generalized): Secondary | ICD-10-CM | POA: Diagnosis not present

## 2019-01-05 DIAGNOSIS — F419 Anxiety disorder, unspecified: Secondary | ICD-10-CM | POA: Diagnosis not present

## 2019-01-05 DIAGNOSIS — R531 Weakness: Secondary | ICD-10-CM | POA: Diagnosis not present

## 2019-01-05 DIAGNOSIS — F339 Major depressive disorder, recurrent, unspecified: Secondary | ICD-10-CM | POA: Diagnosis not present

## 2019-01-05 DIAGNOSIS — Z741 Need for assistance with personal care: Secondary | ICD-10-CM | POA: Diagnosis not present

## 2019-01-05 DIAGNOSIS — R262 Difficulty in walking, not elsewhere classified: Secondary | ICD-10-CM | POA: Diagnosis not present

## 2019-01-05 DIAGNOSIS — M6281 Muscle weakness (generalized): Secondary | ICD-10-CM | POA: Diagnosis not present

## 2019-01-05 DIAGNOSIS — R278 Other lack of coordination: Secondary | ICD-10-CM | POA: Diagnosis not present

## 2019-01-05 DIAGNOSIS — G20 Parkinson's disease: Secondary | ICD-10-CM | POA: Diagnosis not present

## 2019-01-06 DIAGNOSIS — Z741 Need for assistance with personal care: Secondary | ICD-10-CM | POA: Diagnosis not present

## 2019-01-06 DIAGNOSIS — F250 Schizoaffective disorder, bipolar type: Secondary | ICD-10-CM | POA: Diagnosis not present

## 2019-01-06 DIAGNOSIS — M6281 Muscle weakness (generalized): Secondary | ICD-10-CM | POA: Diagnosis not present

## 2019-01-06 DIAGNOSIS — F064 Anxiety disorder due to known physiological condition: Secondary | ICD-10-CM | POA: Diagnosis not present

## 2019-01-06 DIAGNOSIS — G20 Parkinson's disease: Secondary | ICD-10-CM | POA: Diagnosis not present

## 2019-01-06 DIAGNOSIS — R278 Other lack of coordination: Secondary | ICD-10-CM | POA: Diagnosis not present

## 2019-01-06 DIAGNOSIS — F5105 Insomnia due to other mental disorder: Secondary | ICD-10-CM | POA: Diagnosis not present

## 2019-01-06 DIAGNOSIS — F0281 Dementia in other diseases classified elsewhere with behavioral disturbance: Secondary | ICD-10-CM | POA: Diagnosis not present

## 2019-01-06 DIAGNOSIS — G3183 Dementia with Lewy bodies: Secondary | ICD-10-CM | POA: Diagnosis not present

## 2019-01-06 DIAGNOSIS — R262 Difficulty in walking, not elsewhere classified: Secondary | ICD-10-CM | POA: Diagnosis not present

## 2019-01-06 DIAGNOSIS — F331 Major depressive disorder, recurrent, moderate: Secondary | ICD-10-CM | POA: Diagnosis not present

## 2019-01-06 DIAGNOSIS — R531 Weakness: Secondary | ICD-10-CM | POA: Diagnosis not present

## 2019-01-08 DIAGNOSIS — G20 Parkinson's disease: Secondary | ICD-10-CM | POA: Diagnosis not present

## 2019-01-08 DIAGNOSIS — R278 Other lack of coordination: Secondary | ICD-10-CM | POA: Diagnosis not present

## 2019-01-08 DIAGNOSIS — M6281 Muscle weakness (generalized): Secondary | ICD-10-CM | POA: Diagnosis not present

## 2019-01-08 DIAGNOSIS — Z741 Need for assistance with personal care: Secondary | ICD-10-CM | POA: Diagnosis not present

## 2019-01-08 DIAGNOSIS — R531 Weakness: Secondary | ICD-10-CM | POA: Diagnosis not present

## 2019-01-08 DIAGNOSIS — R262 Difficulty in walking, not elsewhere classified: Secondary | ICD-10-CM | POA: Diagnosis not present

## 2019-01-10 DIAGNOSIS — G20 Parkinson's disease: Secondary | ICD-10-CM | POA: Diagnosis not present

## 2019-01-10 DIAGNOSIS — R262 Difficulty in walking, not elsewhere classified: Secondary | ICD-10-CM | POA: Diagnosis not present

## 2019-01-10 DIAGNOSIS — M6281 Muscle weakness (generalized): Secondary | ICD-10-CM | POA: Diagnosis not present

## 2019-01-10 DIAGNOSIS — F339 Major depressive disorder, recurrent, unspecified: Secondary | ICD-10-CM | POA: Diagnosis not present

## 2019-01-10 DIAGNOSIS — R531 Weakness: Secondary | ICD-10-CM | POA: Diagnosis not present

## 2019-01-10 DIAGNOSIS — R278 Other lack of coordination: Secondary | ICD-10-CM | POA: Diagnosis not present

## 2019-01-10 DIAGNOSIS — Z741 Need for assistance with personal care: Secondary | ICD-10-CM | POA: Diagnosis not present

## 2019-01-10 DIAGNOSIS — F419 Anxiety disorder, unspecified: Secondary | ICD-10-CM | POA: Diagnosis not present

## 2019-01-11 DIAGNOSIS — Z79899 Other long term (current) drug therapy: Secondary | ICD-10-CM | POA: Diagnosis not present

## 2019-01-11 DIAGNOSIS — G20 Parkinson's disease: Secondary | ICD-10-CM | POA: Diagnosis not present

## 2019-01-11 DIAGNOSIS — R278 Other lack of coordination: Secondary | ICD-10-CM | POA: Diagnosis not present

## 2019-01-11 DIAGNOSIS — N390 Urinary tract infection, site not specified: Secondary | ICD-10-CM | POA: Diagnosis not present

## 2019-01-11 DIAGNOSIS — R262 Difficulty in walking, not elsewhere classified: Secondary | ICD-10-CM | POA: Diagnosis not present

## 2019-01-11 DIAGNOSIS — Z741 Need for assistance with personal care: Secondary | ICD-10-CM | POA: Diagnosis not present

## 2019-01-11 DIAGNOSIS — R531 Weakness: Secondary | ICD-10-CM | POA: Diagnosis not present

## 2019-01-11 DIAGNOSIS — M6281 Muscle weakness (generalized): Secondary | ICD-10-CM | POA: Diagnosis not present

## 2019-01-11 DIAGNOSIS — R319 Hematuria, unspecified: Secondary | ICD-10-CM | POA: Diagnosis not present

## 2019-01-12 DIAGNOSIS — R278 Other lack of coordination: Secondary | ICD-10-CM | POA: Diagnosis not present

## 2019-01-12 DIAGNOSIS — R262 Difficulty in walking, not elsewhere classified: Secondary | ICD-10-CM | POA: Diagnosis not present

## 2019-01-12 DIAGNOSIS — R531 Weakness: Secondary | ICD-10-CM | POA: Diagnosis not present

## 2019-01-12 DIAGNOSIS — Z741 Need for assistance with personal care: Secondary | ICD-10-CM | POA: Diagnosis not present

## 2019-01-12 DIAGNOSIS — M6281 Muscle weakness (generalized): Secondary | ICD-10-CM | POA: Diagnosis not present

## 2019-01-12 DIAGNOSIS — G20 Parkinson's disease: Secondary | ICD-10-CM | POA: Diagnosis not present

## 2019-01-13 DIAGNOSIS — M6281 Muscle weakness (generalized): Secondary | ICD-10-CM | POA: Diagnosis not present

## 2019-01-13 DIAGNOSIS — R262 Difficulty in walking, not elsewhere classified: Secondary | ICD-10-CM | POA: Diagnosis not present

## 2019-01-13 DIAGNOSIS — Z741 Need for assistance with personal care: Secondary | ICD-10-CM | POA: Diagnosis not present

## 2019-01-13 DIAGNOSIS — R278 Other lack of coordination: Secondary | ICD-10-CM | POA: Diagnosis not present

## 2019-01-13 DIAGNOSIS — R531 Weakness: Secondary | ICD-10-CM | POA: Diagnosis not present

## 2019-01-13 DIAGNOSIS — G20 Parkinson's disease: Secondary | ICD-10-CM | POA: Diagnosis not present

## 2019-01-14 DIAGNOSIS — G20 Parkinson's disease: Secondary | ICD-10-CM | POA: Diagnosis not present

## 2019-01-14 DIAGNOSIS — M6281 Muscle weakness (generalized): Secondary | ICD-10-CM | POA: Diagnosis not present

## 2019-01-14 DIAGNOSIS — R262 Difficulty in walking, not elsewhere classified: Secondary | ICD-10-CM | POA: Diagnosis not present

## 2019-01-14 DIAGNOSIS — R531 Weakness: Secondary | ICD-10-CM | POA: Diagnosis not present

## 2019-01-14 DIAGNOSIS — R278 Other lack of coordination: Secondary | ICD-10-CM | POA: Diagnosis not present

## 2019-01-14 DIAGNOSIS — Z741 Need for assistance with personal care: Secondary | ICD-10-CM | POA: Diagnosis not present

## 2019-01-15 DIAGNOSIS — G20 Parkinson's disease: Secondary | ICD-10-CM | POA: Diagnosis not present

## 2019-01-15 DIAGNOSIS — I10 Essential (primary) hypertension: Secondary | ICD-10-CM | POA: Diagnosis not present

## 2019-01-15 DIAGNOSIS — R440 Auditory hallucinations: Secondary | ICD-10-CM | POA: Diagnosis not present

## 2019-01-15 DIAGNOSIS — E785 Hyperlipidemia, unspecified: Secondary | ICD-10-CM | POA: Diagnosis not present

## 2019-01-16 DIAGNOSIS — M6281 Muscle weakness (generalized): Secondary | ICD-10-CM | POA: Diagnosis not present

## 2019-01-16 DIAGNOSIS — G20 Parkinson's disease: Secondary | ICD-10-CM | POA: Diagnosis not present

## 2019-01-16 DIAGNOSIS — R531 Weakness: Secondary | ICD-10-CM | POA: Diagnosis not present

## 2019-01-16 DIAGNOSIS — R262 Difficulty in walking, not elsewhere classified: Secondary | ICD-10-CM | POA: Diagnosis not present

## 2019-01-16 DIAGNOSIS — R278 Other lack of coordination: Secondary | ICD-10-CM | POA: Diagnosis not present

## 2019-01-16 DIAGNOSIS — Z741 Need for assistance with personal care: Secondary | ICD-10-CM | POA: Diagnosis not present

## 2019-01-17 DIAGNOSIS — G20 Parkinson's disease: Secondary | ICD-10-CM | POA: Diagnosis not present

## 2019-01-17 DIAGNOSIS — R531 Weakness: Secondary | ICD-10-CM | POA: Diagnosis not present

## 2019-01-17 DIAGNOSIS — Z741 Need for assistance with personal care: Secondary | ICD-10-CM | POA: Diagnosis not present

## 2019-01-17 DIAGNOSIS — R262 Difficulty in walking, not elsewhere classified: Secondary | ICD-10-CM | POA: Diagnosis not present

## 2019-01-17 DIAGNOSIS — M6281 Muscle weakness (generalized): Secondary | ICD-10-CM | POA: Diagnosis not present

## 2019-01-17 DIAGNOSIS — R278 Other lack of coordination: Secondary | ICD-10-CM | POA: Diagnosis not present

## 2019-01-19 DIAGNOSIS — M6281 Muscle weakness (generalized): Secondary | ICD-10-CM | POA: Diagnosis not present

## 2019-01-19 DIAGNOSIS — G20 Parkinson's disease: Secondary | ICD-10-CM | POA: Diagnosis not present

## 2019-01-19 DIAGNOSIS — R278 Other lack of coordination: Secondary | ICD-10-CM | POA: Diagnosis not present

## 2019-01-19 DIAGNOSIS — Z741 Need for assistance with personal care: Secondary | ICD-10-CM | POA: Diagnosis not present

## 2019-01-19 DIAGNOSIS — R262 Difficulty in walking, not elsewhere classified: Secondary | ICD-10-CM | POA: Diagnosis not present

## 2019-01-19 DIAGNOSIS — R531 Weakness: Secondary | ICD-10-CM | POA: Diagnosis not present

## 2019-01-20 DIAGNOSIS — E785 Hyperlipidemia, unspecified: Secondary | ICD-10-CM | POA: Diagnosis not present

## 2019-01-20 DIAGNOSIS — R278 Other lack of coordination: Secondary | ICD-10-CM | POA: Diagnosis not present

## 2019-01-20 DIAGNOSIS — I10 Essential (primary) hypertension: Secondary | ICD-10-CM | POA: Diagnosis not present

## 2019-01-20 DIAGNOSIS — G20 Parkinson's disease: Secondary | ICD-10-CM | POA: Diagnosis not present

## 2019-01-20 DIAGNOSIS — M6281 Muscle weakness (generalized): Secondary | ICD-10-CM | POA: Diagnosis not present

## 2019-01-20 DIAGNOSIS — R531 Weakness: Secondary | ICD-10-CM | POA: Diagnosis not present

## 2019-01-20 DIAGNOSIS — F339 Major depressive disorder, recurrent, unspecified: Secondary | ICD-10-CM | POA: Diagnosis not present

## 2019-01-20 DIAGNOSIS — F419 Anxiety disorder, unspecified: Secondary | ICD-10-CM | POA: Diagnosis not present

## 2019-01-20 DIAGNOSIS — Z741 Need for assistance with personal care: Secondary | ICD-10-CM | POA: Diagnosis not present

## 2019-01-20 DIAGNOSIS — R262 Difficulty in walking, not elsewhere classified: Secondary | ICD-10-CM | POA: Diagnosis not present

## 2019-01-20 DIAGNOSIS — R440 Auditory hallucinations: Secondary | ICD-10-CM | POA: Diagnosis not present

## 2019-01-21 DIAGNOSIS — B9620 Unspecified Escherichia coli [E. coli] as the cause of diseases classified elsewhere: Secondary | ICD-10-CM | POA: Diagnosis not present

## 2019-01-21 DIAGNOSIS — K5900 Constipation, unspecified: Secondary | ICD-10-CM | POA: Diagnosis not present

## 2019-01-21 DIAGNOSIS — M545 Low back pain: Secondary | ICD-10-CM | POA: Diagnosis not present

## 2019-01-21 DIAGNOSIS — G20 Parkinson's disease: Secondary | ICD-10-CM | POA: Diagnosis not present

## 2019-01-21 DIAGNOSIS — I10 Essential (primary) hypertension: Secondary | ICD-10-CM | POA: Diagnosis not present

## 2019-01-21 DIAGNOSIS — G629 Polyneuropathy, unspecified: Secondary | ICD-10-CM | POA: Diagnosis not present

## 2019-01-21 DIAGNOSIS — F419 Anxiety disorder, unspecified: Secondary | ICD-10-CM | POA: Diagnosis not present

## 2019-01-21 DIAGNOSIS — F251 Schizoaffective disorder, depressive type: Secondary | ICD-10-CM | POA: Diagnosis not present

## 2019-01-21 DIAGNOSIS — R531 Weakness: Secondary | ICD-10-CM | POA: Diagnosis not present

## 2019-01-21 DIAGNOSIS — R32 Unspecified urinary incontinence: Secondary | ICD-10-CM | POA: Diagnosis not present

## 2019-01-21 DIAGNOSIS — Z741 Need for assistance with personal care: Secondary | ICD-10-CM | POA: Diagnosis not present

## 2019-01-21 DIAGNOSIS — J45909 Unspecified asthma, uncomplicated: Secondary | ICD-10-CM | POA: Diagnosis not present

## 2019-01-21 DIAGNOSIS — I952 Hypotension due to drugs: Secondary | ICD-10-CM | POA: Diagnosis not present

## 2019-01-21 DIAGNOSIS — F0280 Dementia in other diseases classified elsewhere without behavioral disturbance: Secondary | ICD-10-CM | POA: Diagnosis not present

## 2019-01-21 DIAGNOSIS — Z7189 Other specified counseling: Secondary | ICD-10-CM | POA: Diagnosis not present

## 2019-01-21 DIAGNOSIS — R1312 Dysphagia, oropharyngeal phase: Secondary | ICD-10-CM | POA: Diagnosis not present

## 2019-01-21 DIAGNOSIS — F0150 Vascular dementia without behavioral disturbance: Secondary | ICD-10-CM | POA: Diagnosis not present

## 2019-01-21 DIAGNOSIS — R1311 Dysphagia, oral phase: Secondary | ICD-10-CM | POA: Diagnosis not present

## 2019-01-21 DIAGNOSIS — F329 Major depressive disorder, single episode, unspecified: Secondary | ICD-10-CM | POA: Diagnosis not present

## 2019-01-21 DIAGNOSIS — M6281 Muscle weakness (generalized): Secondary | ICD-10-CM | POA: Diagnosis not present

## 2019-01-21 DIAGNOSIS — R251 Tremor, unspecified: Secondary | ICD-10-CM | POA: Diagnosis not present

## 2019-01-21 DIAGNOSIS — R262 Difficulty in walking, not elsewhere classified: Secondary | ICD-10-CM | POA: Diagnosis not present

## 2019-01-21 DIAGNOSIS — M797 Fibromyalgia: Secondary | ICD-10-CM | POA: Diagnosis not present

## 2019-01-21 DIAGNOSIS — K219 Gastro-esophageal reflux disease without esophagitis: Secondary | ICD-10-CM | POA: Diagnosis not present

## 2019-01-21 DIAGNOSIS — J309 Allergic rhinitis, unspecified: Secondary | ICD-10-CM | POA: Diagnosis not present

## 2019-01-21 DIAGNOSIS — N390 Urinary tract infection, site not specified: Secondary | ICD-10-CM | POA: Diagnosis not present

## 2019-01-21 DIAGNOSIS — R440 Auditory hallucinations: Secondary | ICD-10-CM | POA: Diagnosis not present

## 2019-01-21 DIAGNOSIS — G3183 Dementia with Lewy bodies: Secondary | ICD-10-CM | POA: Diagnosis not present

## 2019-01-21 DIAGNOSIS — E785 Hyperlipidemia, unspecified: Secondary | ICD-10-CM | POA: Diagnosis not present

## 2019-01-21 DIAGNOSIS — M2560 Stiffness of unspecified joint, not elsewhere classified: Secondary | ICD-10-CM | POA: Diagnosis not present

## 2019-01-21 DIAGNOSIS — F339 Major depressive disorder, recurrent, unspecified: Secondary | ICD-10-CM | POA: Diagnosis not present

## 2019-01-21 DIAGNOSIS — Z8673 Personal history of transient ischemic attack (TIA), and cerebral infarction without residual deficits: Secondary | ICD-10-CM | POA: Diagnosis not present

## 2019-01-21 DIAGNOSIS — M1990 Unspecified osteoarthritis, unspecified site: Secondary | ICD-10-CM | POA: Diagnosis not present

## 2019-01-21 DIAGNOSIS — R278 Other lack of coordination: Secondary | ICD-10-CM | POA: Diagnosis not present

## 2019-01-21 DIAGNOSIS — G8929 Other chronic pain: Secondary | ICD-10-CM | POA: Diagnosis not present

## 2019-01-21 DIAGNOSIS — M150 Primary generalized (osteo)arthritis: Secondary | ICD-10-CM | POA: Diagnosis not present

## 2019-01-21 DIAGNOSIS — Z9189 Other specified personal risk factors, not elsewhere classified: Secondary | ICD-10-CM | POA: Diagnosis not present

## 2019-01-21 DIAGNOSIS — W19XXXA Unspecified fall, initial encounter: Secondary | ICD-10-CM | POA: Diagnosis not present

## 2019-01-21 DIAGNOSIS — G4700 Insomnia, unspecified: Secondary | ICD-10-CM | POA: Diagnosis not present

## 2019-01-24 DIAGNOSIS — F419 Anxiety disorder, unspecified: Secondary | ICD-10-CM | POA: Diagnosis not present

## 2019-01-24 DIAGNOSIS — F339 Major depressive disorder, recurrent, unspecified: Secondary | ICD-10-CM | POA: Diagnosis not present

## 2019-01-25 ENCOUNTER — Encounter: Payer: Self-pay | Admitting: Internal Medicine

## 2019-01-25 ENCOUNTER — Non-Acute Institutional Stay (SKILLED_NURSING_FACILITY): Payer: Medicare Other | Admitting: Internal Medicine

## 2019-01-25 DIAGNOSIS — G20 Parkinson's disease: Secondary | ICD-10-CM

## 2019-01-25 DIAGNOSIS — Z9189 Other specified personal risk factors, not elsewhere classified: Secondary | ICD-10-CM | POA: Diagnosis not present

## 2019-01-25 DIAGNOSIS — G20A1 Parkinson's disease without dyskinesia, without mention of fluctuations: Secondary | ICD-10-CM

## 2019-01-25 DIAGNOSIS — F0150 Vascular dementia without behavioral disturbance: Secondary | ICD-10-CM

## 2019-01-25 NOTE — Patient Instructions (Addendum)
See assessment and plan under each diagnosis in the problem list and acutely for this visit Attempt to reach daughter, Loma Boston ([phone]) to clarify whether she has been diagnosed with Bipolar Disorder as on Lithium.

## 2019-01-25 NOTE — Progress Notes (Signed)
NURSING HOME LOCATION:  [facility] ROOM NUMBER: [id number]    CODE STATUS: Full Code    PCP:  Sherron Monday MD  This is a comprehensive admission note to [facility] performed on this date less than 30 days from date of admission. Included are preadmission medical/surgical history; reconciled medication list; family history; social history and comprehensive review of systems.  Corrections and additions to the records were documented. Comprehensive physical exam was also performed. Additionally a clinical summary was entered for each active diagnosis pertinent to this admission in the Problem List to enhance continuity of care.  HPI: Patient had been a resident at [facility] in [place] with multiple medical diagnoses.  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 Medications include albuterol, amlodipine, low-dose aspirin, as needed Ativan, topical Biofreeze, Buspiprone, cyclobenzaprine, donepezil, Geodon, lithium, methocarbamol, Imdur, Neurontin, pantoprazole, Sinemet, and Zofran.  Past medical and surgical history: Surgeries include cholecystectomy and hysterectomy.  Social history: Non-smoker, nondrinker  Family history: Noncontributory due to age   Review of systems:  Could not be completed due to dementia.  She stated it was Friday and she knew she was in [place].  She could not give me the name of her prior SNF.  She stated that she moved there 3 years ago but could not tell me what year.  She does validate that she has Parkinson's but could not give me the name of her Neurologist.  She denies history of schizophrenia or bipolar disorder.  She does validate that she previously heard  voices.  She has had intermittent constipation.  She denies any other significant cardiac, pulmonary, GI, or GU symptoms.  She basically said "I am fine".  She does state that her "legs are giving out again". PT/OT states that she requires [facility] lift and two-person assist.  Constitutional: No fever, significant weight change, fatigue  Eyes: No redness, discharge, pain, vision change ENT/mouth: No nasal congestion, purulent discharge, earache, change in hearing, sore throat  Cardiovascular: No chest pain, palpitations, paroxysmal nocturnal dyspnea, claudication, edema  Respiratory: No cough, sputum production, hemoptysis, DOE, significant snoring, apnea Gastrointestinal: No heartburn, dysphagia, abdominal pain, nausea /vomiting, rectal bleeding, melena Genitourinary: No dysuria, hematuria, pyuria, incontinence, nocturia Musculoskeletal: No joint stiffness, joint swelling, weakness, pain Dermatologic: No rash, pruritus, change in appearance of skin Neurologic: No dizziness, headache, syncope, seizures, numbness, tingling Psychiatric: No significant anxiety, depression, insomnia, anorexia Endocrine: No change in hair/skin/nails, excessive thirst, excessive hunger, excessive urination  Hematologic/lymphatic: No significant bruising, lymphadenopathy, abnormal bleeding Allergy/immunology: No itchy/watery eyes, significant sneezing, urticaria, angioedema  Physical exam:  Pertinent or positive findings: Affect is markedly flat.  Masked facies are present.  Heart sounds are distant.  She is weak bilaterally.  She has intermittent tremor of her hands which is not a pill-rolling tremor of Parkinson's.  With range of motion testing there is minimal "ratcheting".  There is slight hyperpigmentation over the right anterior ankle which does not blanch to pressure.  She has slight edema left lower extremity.  General appearance: Adequately nourished; no acute distress, increased work of breathing is present.     Lymphatic: No lymphadenopathy about the head, neck, axilla. Eyes: No conjunctival inflammation or lid edema is present. There is no scleral icterus. Ears:  External ear exam shows no significant lesions or deformities.  Nose:  External nasal examination shows no deformity or inflammation. Nasal mucosa are pink and moist without lesions, exudates Oral exam: Lips and gums are healthy appearing.There is no oropharyngeal erythema or exudate. Neck:  No thyromegaly, masses, tenderness noted.    Heart:  Normal rate and regular rhythm. S1 and S2 normal without gallop, murmur, click, rub.  Lungs: Chest clear to auscultation without wheezes, rhonchi, rales, rubs. Abdomen: Bowel sounds are normal.  Abdomen is soft and nontender with no organomegaly, hernias, masses. GU: Deferred  Extremities:  No cyanosis, clubbing. Neurologic exam: Balance, Rhomberg, finger to nose testing could not be completed due to clinical state Skin: Warm & dry w/o tenting. No significant  rash.  See clinical summary under each active problem in the Problem List with associated updated therapeutic plan

## 2019-01-25 NOTE — Assessment & Plan Note (Addendum)
Pysch NP consultation to help manage multiple psychotropic drugs D/C both muscle relaxants

## 2019-01-25 NOTE — Assessment & Plan Note (Addendum)
Reassess need for Aricept and Namenda in the context of diagnosis of vascular dementia

## 2019-01-25 NOTE — Assessment & Plan Note (Signed)
Neurology reassessment as per Dr. Terrace Arabia

## 2019-01-26 ENCOUNTER — Encounter: Payer: Self-pay | Admitting: Internal Medicine

## 2019-01-26 DIAGNOSIS — F259 Schizoaffective disorder, unspecified: Secondary | ICD-10-CM | POA: Insufficient documentation

## 2019-01-31 ENCOUNTER — Other Ambulatory Visit: Payer: Self-pay

## 2019-01-31 MED ORDER — TRAMADOL HCL 50 MG PO TABS: 50.0000 mg | ORAL_TABLET | Freq: Three times a day (TID) | ORAL | 0 refills | Status: DC | PRN

## 2019-02-07 ENCOUNTER — Other Ambulatory Visit: Payer: Self-pay

## 2019-02-07 MED ORDER — TRAMADOL HCL 50 MG PO TABS: 50.0000 mg | ORAL_TABLET | Freq: Three times a day (TID) | ORAL | 0 refills | Status: AC | PRN

## 2019-02-15 ENCOUNTER — Non-Acute Institutional Stay (SKILLED_NURSING_FACILITY): Payer: Medicare Other | Admitting: Internal Medicine

## 2019-02-15 ENCOUNTER — Encounter: Payer: Self-pay | Admitting: Internal Medicine

## 2019-02-15 DIAGNOSIS — W19XXXA Unspecified fall, initial encounter: Secondary | ICD-10-CM

## 2019-02-15 DIAGNOSIS — R251 Tremor, unspecified: Secondary | ICD-10-CM | POA: Diagnosis not present

## 2019-02-15 NOTE — Patient Instructions (Signed)
See assessment and plan under each diagnosis acutely for this visit  

## 2019-02-15 NOTE — Progress Notes (Signed)
   NURSING HOME LOCATION:  [facility] ROOM NUMBER:    CODE STATUS:  DNR Room: 225-B  PCP:  Douglass Rivers MD   This is a nursing facility follow up for specific acute issue of recurrent fall with abrasions to extremities.  Interim medical record and care since last [facility] visit was updated with review of diagnostic studies and change in clinical status since last visit were documented.  HPI: Change in condition form was received stating that the patient was found on the floor, lying on her right side without obvious clinical musculoskeletal injury.  Range of motion was clinically normal.  The patient denied any pain to the staff but did exhibit abrasions to the right forearm and right knee.  Review of systems: Dementia invalidated responses. Date given as April 20,????. When asked the president's name her response was "the same old 1". She did deny any cardiac or pulmonary prodrome prior to the fall.  She states that she simply "tripped over my own feet".   Cardiovascular: No chest pain, palpitations, paroxysmal nocturnal dyspnea, claudication, edema  Musculoskeletal: No joint stiffness, joint swelling, weakness, pain Neurologic: No dizziness, headache, syncope, seizures, numbness, tingling  Physical exam:  Pertinent or positive findings: She sits in the chair with her head down avoiding eye contact.  She speaks in a low voice.  Ptosis is greater on the left than the right. In spite of her demeanor she is much more alert than when last seen.  Heart rhythm and rate are regular but heart sounds are distant.  She has minor rales at the bases.  Abdomen is protuberant.  Pedal pulses are decreased especially posterior tibial pulses.  She is symmetrically weak to opposition in all limbs.  She has intermittent non-parkinsonian tremor of the right hand as well as intermittent jerking of her hands and head. There is a minor abrasion over the right knee with some tenderness to palpation.  No effusion or ecchymosis present. There is adequate range of motion.  She has minor linear abrasions over the right elbow.  Again range of motion appears normal.  General appearance: Adequately nourished; no acute distress, increased work of breathing is present.   Lymphatic: No lymphadenopathy about the head, neck, axilla. Eyes: No conjunctival inflammation or lid edema is present. There is no scleral icterus. Ears:  External ear exam shows no significant lesions or deformities.   Nose:  External nasal examination shows no deformity or inflammation. Nasal mucosa are pink and moist without lesions, exudates Oral exam:  Lips and gums are healthy appearing. Neck:  No thyromegaly, masses, tenderness noted.    Heart:  No gallop, murmur, click, rub .  Lungs:  without wheezes, rhonchi, rubs. Abdomen: Bowel sounds are normal. Abdomen is soft and nontender with no organomegaly, hernias, masses. GU: Deferred  Extremities:  No cyanosis, clubbing, edema  Neurologic exam : Balance, Rhomberg, finger to nose testing could not be completed due to clinical state Skin: Warm & dry w/o tenting. No significant lesions or rash.  See summary under each active problem in the Problem List with associated updated therapeutic plan

## 2019-02-16 ENCOUNTER — Other Ambulatory Visit: Payer: Self-pay | Admitting: Internal Medicine

## 2019-02-16 MED ORDER — TRAMADOL HCL 50 MG PO TABS: 50.0000 mg | ORAL_TABLET | Freq: Three times a day (TID) | ORAL | 0 refills | Status: AC | PRN

## 2019-02-16 NOTE — Progress Notes (Signed)
Because of multiple potential contraindications to tramadol and the potential for drug:drug interactions (duloxetine and lithium); tramadol will be weaned and discontinued.

## 2019-02-18 ENCOUNTER — Encounter: Payer: Self-pay | Admitting: Internal Medicine

## 2019-02-21 ENCOUNTER — Encounter: Payer: Self-pay | Admitting: Internal Medicine

## 2019-02-21 NOTE — Progress Notes (Signed)
Location:    [facility] Room Number: 110/A Place of Service:  SNF (31) Provider: Edmon Crape PA-C  Sherron Monday, MD  Patient Care Team: Sherron Monday, MD as PCP - General (Internal Medicine) Center, [facility] (Skilled Nursing Facility)  Extended Emergency Contact Information Primary Emergency Contact: [name] of [place] Home Phone: [phone] Relation: Daughter Secondary Emergency Contact: [name] [place] of [place] Home Phone: [phone] Relation: Daughter  Code Status:  Full Code Goals of care: Advanced Directive information Advanced Directives 02/21/2019  Does Patient Have a Medical Advance Directive? Yes  Type of Advance Directive (No Data)  Does patient want to make changes to medical advance directive? No - Patient declined  Would patient like information on creating a medical advance directive? -  Pre-existing out of facility DNR order (yellow form or pink MOST form) -     Chief Complaint  Patient presents with  . Medical Management of Chronic Issues    Routine visit of medical management     HPI:  Pt is a 73 y.o. female seen today for medical management of chronic diseases.     Past Medical History:  Diagnosis Date  . Allergy   . Anxiety   . Arthritis   . Asthma   . Benign essential hypertension 03/18/2017  . Cataract   . Dementia (HCC)   . Depression   . Difficulty walking   . Dysphagia, oral phase   . Fibromyalgia   . GERD (gastroesophageal reflux disease)   . Hallucinations   . Hyperlipidemia   . Hypertension   . Lower extremity edema   . Parkinson disease (HCC)   . Repeated falls   . Rhabdomyolysis   . Stroke (HCC)   . TIA (transient ischemic attack)   . Urinary incontinence    Past Surgical History:  Procedure Laterality Date  . ABDOMINAL HYSTERECTOMY    . CATARACT EXTRACTION    . CHOLECYSTECTOMY    . TONSILLECTOMY AND ADENOIDECTOMY       Allergies  Allergen Reactions  . Hydrocodone Other (See Comments)    GI upset    Allergies as of 02/21/2019      Reactions   Hydrocodone Other (See Comments)   GI upset      Medication List       Accurate as of February 21, 2019  3:28 PM. Always use your most recent med list.        albuterol 108 (90 Base) MCG/ACT inhaler Commonly known as:  PROVENTIL HFA;VENTOLIN HFA Inhale 2 puffs into the lungs every 6 (six) hours as needed for wheezing or shortness of breath.   amLODipine 10 MG tablet Commonly known as:  NORVASC Take 10 mg by mouth daily.   aspirin EC 81 MG tablet Take 81 mg by mouth daily.   Biofreeze 4 % Gel Generic drug:  Menthol (Topical Analgesic) Apply 1 application topically 2 (two) times daily. For posterior neck pain and bilateral knees   Bisacodyl Laxative 10 MG suppository Generic drug:  bisacodyl Place 10 mg rectally as needed for moderate constipation.   busPIRone 5 MG tablet Commonly known as:  BUSPAR Take 15 mg by mouth 3 (three) times daily. For anxiety   calcium carbonate 500 MG chewable tablet Commonly known as:  TUMS - dosed in mg elemental calcium Chew 1 tablet by mouth every 6 (six) hours as needed for indigestion or heartburn.   cholecalciferol 1000 units tablet  Commonly known as:  VITAMIN D Take 1,000 Units by mouth daily.   cyclobenzaprine 10 MG tablet Commonly known as:  FLEXERIL Take 10 mg by mouth at bedtime.   donepezil 10 MG tablet Commonly known as:  ARICEPT Take 10 mg by mouth at bedtime.   DULoxetine 30 MG capsule Commonly known as:  CYMBALTA Give 3 tablets (90mg ) by mouth daily   fluticasone 50 MCG/ACT nasal spray Commonly known as:  FLONASE Place 2 sprays into both nostrils daily.   gabapentin 100 MG capsule Commonly known as:  NEURONTIN Take 100 mg by mouth at bedtime.   lidocaine 5 % Commonly known as:  LIDODERM Place 2 patches onto the skin daily. Apply two patches tyo lower back daily for pain   lithium  carbonate 150 MG capsule Take 300 mg by mouth at bedtime.   loperamide 2 MG capsule Commonly known as:  IMODIUM Take 2 mg by mouth every 6 (six) hours as needed for diarrhea or loose stools.   loratadine 10 MG tablet Commonly known as:  CLARITIN Take 10 mg by mouth daily as needed for allergies.   LORazepam 1 MG tablet Commonly known as:  ATIVAN Take 1 tablet (1 mg total) by mouth every 8 (eight) hours.   Melatonin 3 MG Tabs Take 1 tablet by mouth at bedtime.   meloxicam 15 MG tablet Commonly known as:  MOBIC Take 15 mg by mouth daily.   memantine 10 MG tablet Commonly known as:  NAMENDA Take 10 mg by mouth 2 (two) times daily.   ondansetron 4 MG tablet Commonly known as:  ZOFRAN Take 4 mg by mouth every 6 (six) hours as needed for nausea or vomiting.   pantoprazole 40 MG tablet Commonly known as:  PROTONIX Take 40 mg by mouth daily.   polyethylene glycol packet Commonly known as:  MIRALAX / GLYCOLAX Take 17 g by mouth daily as needed.   Robaxin 500 MG tablet Generic drug:  methocarbamol Take 500 mg by mouth at bedtime.   traMADol 50 MG tablet Commonly known as:  ULTRAM Take 1 tablet (50 mg total) by mouth every 8 (eight) hours as needed for up to 7 days.   ziprasidone 80 MG capsule Commonly known as:  GEODON Give one by mouth twice a day for Psychosis       Review of Systems  Immunization History  Administered Date(s) Administered  . Influenza-Unspecified 03/17/2017, 09/09/2017  . PPD Test 03/24/2017, 06/02/2018, 06/08/2018  . Pneumococcal Polysaccharide-23 03/17/2017, 06/12/2018   Pertinent  Health Maintenance Due  Topic Date Due  . INFLUENZA VACCINE  06/18/2019  . DEXA SCAN  Discontinued  . COLONOSCOPY  Discontinued  . PNA vac Low Risk Adult  Discontinued   Fall Risk  05/27/2018 05/14/2017  Falls in the past year? No Yes  Number falls in past yr: - 2 or more  Injury with Fall? - No   Functional Status Survey:    Vitals:   02/21/19 1515   BP: 116/71  Pulse: 63  Resp: 18  Temp: (!) 97.2 F (36.2 C)  TempSrc: Oral  Weight: 159 lb 6.4 oz (72.3 kg)  Height: 5\' 5"  (1.651 m)   Body mass index is 26.53 kg/m. Physical Exam  Labs reviewed: Recent Labs    07/19/18 07/21/18 12/17/18 0800  NA 138 141 139  K 3.5 3.4 3.4*  CL  --   --  104  CO2  --   --  27  GLUCOSE  --   --  98  BUN 15 15 25*  CREATININE 0.7 0.9 1.14*  CALCIUM  --   --  9.3   Recent Labs    07/21/18 08/05/18 12/17/18 0800  AST 79* 22 17  ALT 82* 7 9  ALKPHOS 652* 228* 77  BILITOT  --   --  0.6  PROT  --   --  7.3  ALBUMIN  --   --  4.4   Recent Labs    07/02/18 07/19/18 12/17/18 0800  WBC 5.2 4.5 8.9  NEUTROABS 3 3 5.7  HGB 12.8 13.8 12.9  HCT 36 40 39.0  MCV  --   --  93.5  PLT 260 185 271   Lab Results  Component Value Date   TSH 2.86 07/02/2018   Lab Results  Component Value Date   HGBA1C 5.4 03/15/2018   Lab Results  Component Value Date   CHOL 139 03/15/2018   HDL 46 03/15/2018   LDLCALC 74 03/15/2018   TRIG 97 03/15/2018    Significant Diagnostic Results in last 30 days:  No results found.  Assessment/Plan There are no diagnoses linked to this encounter.   Family/ staff Communication:   Labs/tests ordered:      This encounter was created in error - please disregard.

## 2019-02-22 ENCOUNTER — Encounter: Payer: Self-pay | Admitting: Nurse Practitioner

## 2019-02-22 ENCOUNTER — Encounter: Payer: Self-pay | Admitting: Adult Health

## 2019-02-22 ENCOUNTER — Non-Acute Institutional Stay (SKILLED_NURSING_FACILITY): Payer: Medicare Other | Admitting: Adult Health

## 2019-02-22 DIAGNOSIS — F251 Schizoaffective disorder, depressive type: Secondary | ICD-10-CM

## 2019-02-22 DIAGNOSIS — I10 Essential (primary) hypertension: Secondary | ICD-10-CM | POA: Diagnosis not present

## 2019-02-22 DIAGNOSIS — F0150 Vascular dementia without behavioral disturbance: Secondary | ICD-10-CM

## 2019-02-22 DIAGNOSIS — F419 Anxiety disorder, unspecified: Secondary | ICD-10-CM

## 2019-02-22 DIAGNOSIS — M159 Polyosteoarthritis, unspecified: Secondary | ICD-10-CM

## 2019-02-22 DIAGNOSIS — G629 Polyneuropathy, unspecified: Secondary | ICD-10-CM

## 2019-02-22 DIAGNOSIS — M150 Primary generalized (osteo)arthritis: Secondary | ICD-10-CM

## 2019-02-22 NOTE — Progress Notes (Signed)
This encounter was created in error - please disregard.

## 2019-02-22 NOTE — Progress Notes (Signed)
Location:  [facility] Room Number: 110/A Place of Service:  SNF (31) Provider:  Kenard GowerMedina-Vargas, , NP  Patient Care Team: Sherron Mondayejan-Sie, S Ahmed, MD as PCP - General (Internal Medicine) Center, [facility] ([facility])  Extended Emergency Contact Information Primary Emergency Contact: Levy Pupaamsy,[name]  [place] Home Phone: [phone] Relation: Daughter Secondary Emergency Contact: [name] PaymentMiller,Amy  [place] Home Phone: [phone] Relation: Daughter  Code Status:  Full Code  Goals of care: Advanced Directive information Advanced Directives 02/22/2019  Does Patient Have a Medical Advance Directive? Yes  Type of Advance Directive (No Data)  Does patient want to make changes to medical advance directive? No - Patient declined  Would patient like information on creating a medical advance directive? -  Pre-existing out of facility DNR order (yellow form or pink MOST form) -     Chief Complaint  Patient presents with  . Medical Management of Chronic Issues    Routine visit of medical management    HPI:  Pt is a 73 y.o. female seen today for medical management of chronic diseases. She has PMH of Parkinson's disease, fibromyalgia, dementia, TIA and GERD.  She raised her hand when she heard her name got called out. She is verbally responsive. She is confused to time and place. She denies any concerns. She was recently weaned off from Sinemet. No tremors noted. She has a flat affect. Noted BPs were low -100/64, 116/71, 93/54 and 90/60.   Past Medical History:  Diagnosis Date  . Allergy   . Anxiety   . Arthritis   . Asthma   . Benign essential hypertension 03/18/2017  . Cataract   . Dementia (HCC)   . Depression   . Difficulty walking   . Dysphagia, oral phase   . Fibromyalgia   . GERD (gastroesophageal reflux disease)   . Hallucinations   . Hyperlipidemia   . Hypertension   . Lower extremity edema   .  Parkinson disease (HCC)   . Repeated falls   . Rhabdomyolysis   . Stroke (HCC)   . TIA (transient ischemic attack)   . Urinary incontinence    Past Surgical History:  Procedure Laterality Date  . ABDOMINAL HYSTERECTOMY    . CATARACT EXTRACTION    . CHOLECYSTECTOMY    . TONSILLECTOMY AND ADENOIDECTOMY      Allergies  Allergen Reactions  . Hydrocodone Other (See Comments)    GI upset    Outpatient Encounter Medications as of 02/22/2019  Medication Sig  . albuterol (PROVENTIL HFA;VENTOLIN HFA) 108 (90 Base) MCG/ACT inhaler Inhale 2 puffs into the lungs every 6 (six) hours as needed for wheezing or shortness of breath.  Marland Kitchen. amLODipine (NORVASC) 10 MG tablet Take 10 mg by mouth daily.  Marland Kitchen. aspirin EC 81 MG tablet Take 81 mg by mouth daily.  . bisacodyl (BISACODYL LAXATIVE) 10 MG suppository Place 10 mg rectally as needed for moderate constipation.  . busPIRone (BUSPAR) 5 MG tablet Take 15 mg by mouth 3 (three) times daily. For anxiety  . calcium carbonate (TUMS - DOSED IN MG ELEMENTAL CALCIUM) 500 MG chewable tablet Chew 1 tablet by mouth every 6 (six) hours as needed for indigestion or heartburn.  . cholecalciferol (VITAMIN D) 1000 units tablet Take 1,000 Units by mouth daily.  . cyclobenzaprine (FLEXERIL) 10 MG tablet Take 10 mg by mouth at bedtime.  . donepezil (ARICEPT) 10 MG tablet Take 10 mg by mouth at bedtime.  . DULoxetine (CYMBALTA) 30  MG capsule Give 3 tablets ( ) by mouth daily  . fluticasone (FLONASE) 50 MCG/ACT nasal spray Place 2 sprays into both nostrils daily.  Marland Kitchen gabapentin (NEURONTIN) 100 MG capsule Take 100 mg by mouth at bedtime.  . lidocaine (LIDODERM) 5 % Place 2 patches onto the skin daily. Apply two patches tyo lower back daily for pain  . lithium carbonate 150 MG capsule Take 300 mg by mouth at bedtime.   Marland Kitchen loperamide (IMODIUM) 2 MG capsule Take 2 mg by mouth every 6 (six) hours as needed for diarrhea or loose stools.  Marland Kitchen loratadine (CLARITIN) 10 MG tablet Take  10 mg by mouth daily as needed for allergies.   Marland Kitchen LORazepam (ATIVAN) 1 MG tablet Take 1 tablet (1 mg total) by mouth every 8 (eight) hours.  . Melatonin 3 MG TABS Take 1 tablet by mouth at bedtime.   . meloxicam (MOBIC) 15 MG tablet Take 15 mg by mouth daily.   . memantine (NAMENDA) 10 MG tablet Take 10 mg by mouth 2 (two) times daily.  . Menthol, Topical Analgesic, (BIOFREEZE) 4 % GEL Apply 1 application topically 2 (two) times daily. For posterior neck pain and bilateral knees  . methocarbamol (ROBAXIN) 500 MG tablet Take 500 mg by mouth at bedtime.  . ondansetron (ZOFRAN) 4 MG tablet Take 4 mg by mouth every 6 (six) hours as needed for nausea or vomiting.  . pantoprazole (PROTONIX) 40 MG tablet Take 40 mg by mouth daily.  . polyethylene glycol (MIRALAX / GLYCOLAX) packet Take 17 g by mouth daily as needed.   . traMADol (ULTRAM) 50 MG tablet Take 1 tablet (50 mg total) by mouth every 8 (eight) hours as needed for up to 7 days.  . ziprasidone (GEODON) 80 MG capsule Give one by mouth twice a day for Psychosis   No facility-administered encounter medications on file as of 02/22/2019.     Review of Systems  Unable to obtain due to dementia    Immunization History  Administered Date(s) Administered  . Influenza-Unspecified 03/17/2017, 09/09/2017  . PPD Test 03/24/2017, 06/02/2018, 06/08/2018  . Pneumococcal Polysaccharide-23 03/17/2017, 06/12/2018   Pertinent  Health Maintenance Due  Topic Date Due  . INFLUENZA VACCINE  06/18/2019  . DEXA SCAN  Discontinued  . COLONOSCOPY  Discontinued  . PNA vac Low Risk Adult  Discontinued   Fall Risk  05/27/2018 05/14/2017  Falls in the past year? No Yes  Number falls in past yr: - 2 or more  Injury with Fall? - No     Vitals:   02/22/19 1007  BP: 116/71  Pulse: 63  Resp: 18  Temp: (!) 97.2 F (36.2 C)  TempSrc: Oral  Weight: 159 lb 6.4 oz (72.3 kg)  Height:  (1.651 m)   Body mass index is 26.53 kg/m.  Physical Exam  GENERAL  APPEARANCE: Well nourished. In no acute distress. Normal body habitus SKIN:  Skin is warm and dry.  MOUTH and THROAT: Lips are without lesions. Oral mucosa is moist and without lesions. Tongue is normal in shape, size, and color and without lesions RESPIRATORY: Breathing is even & unlabored, BS CTAB CARDIAC: RRR, no murmur,no extra heart sounds, BLE 1+ edema GI: Abdomen soft, normal BS, no masses, no tenderness EXTREMITIES:  Able to move X 4 extremities NEUROLOGICAL: There is no tremor. Speech is clear. Alert to self, disoriented to time and place. PSYCHIATRIC:  Affect is flat.   Labs reviewed: Recent Labs    07/19/18 07/21/18 12/17/18 0800  NA 138 141 139  K 3.5 3.4 3.4*  CL  --   --  104  CO2  --   --  27  GLUCOSE  --   --  98  BUN 15 15 25*  CREATININE 0.7 0.9 1.14*  CALCIUM  --   --  9.3   Recent Labs    07/21/18 08/05/18 12/17/18 0800  AST 79* 22 17  ALT 82* 7 9  ALKPHOS 652* 228* 77  BILITOT  --   --  0.6  PROT  --   --  7.3  ALBUMIN  --   --  4.4   Recent Labs    07/02/18 07/19/18 12/17/18 0800  WBC 5.2 4.5 8.9  NEUTROABS 3 3 5.7  HGB 12.8 13.8 12.9  HCT 36 40 39.0  MCV  --   --  93.5  PLT 260 185 271   Lab Results  Component Value Date   TSH 2.86 07/02/2018   Lab Results  Component Value Date   HGBA1C 5.4 03/15/2018   Lab Results  Component Value Date   CHOL 139 03/15/2018   HDL 46 03/15/2018   LDLCALC 74 03/15/2018   TRIG 97 03/15/2018     Assessment/Plan  1. Schizoaffective disorder, depressive type (HCC) - stable, has a flat affect, will continue Geodon 80 mg 1 capsule twice a day, lithium carbonate 300 mg 1 capsule at bedtime, duloxetine 30 mg 3 capsules daily  2. Primary osteoarthritis involving multiple joints -Continue Biofreeze 4% gel to posterior neck and bilateral knees twice a day and as needed, tramadol 50 mg 1 tab every 8 hours, discontinue Meloxicam, cyclobenzaprine and methocarbamol, Lidoderm 5% 1 patch apply 2 patches to lower  back daily  3. Benign essential hypertension - BPs note to be low, will decrease amlodipine from 10 mg daily to 5 mg daily and to hold for systolic BP <105, BP twice daily x1 week  4. Neuropathy -Stable, continue Neurontin 100 mg 1 capsule at bedtime   5. Anxiety  -Mood is stable, buspirone 15 mg 1 tab 3 times a day  6. Vascular dementia without behavioral disturbance (HCC) -Continue Aricept 10 mg 1 tablet at bedtime, supportive care and fall precautions   Family/ staff Communication: Discussed plan of care with resident and charge nurse.  Labs/tests ordered:  None  Goals of care: Long-term care   Kenard Gower, NP [facility] [phone] (Monday-Friday 8:00 a.m. - 5:00 p.m.) [phone] (after hours)

## 2019-02-28 ENCOUNTER — Other Ambulatory Visit: Payer: Self-pay | Admitting: Internal Medicine

## 2019-02-28 MED ORDER — LORAZEPAM 1 MG PO TABS: 1.0000 mg | ORAL_TABLET | Freq: Three times a day (TID) | ORAL | 0 refills | Status: DC

## 2019-02-28 MED ORDER — TRAMADOL HCL 50 MG PO TABS: 50.0000 mg | ORAL_TABLET | Freq: Three times a day (TID) | ORAL | 0 refills | Status: DC

## 2019-03-10 ENCOUNTER — Encounter: Payer: Self-pay | Admitting: Internal Medicine

## 2019-03-10 ENCOUNTER — Non-Acute Institutional Stay (SKILLED_NURSING_FACILITY): Payer: Medicare Other | Admitting: Internal Medicine

## 2019-03-10 DIAGNOSIS — M545 Low back pain: Secondary | ICD-10-CM | POA: Diagnosis not present

## 2019-03-10 DIAGNOSIS — G20 Parkinson's disease: Secondary | ICD-10-CM | POA: Diagnosis not present

## 2019-03-10 DIAGNOSIS — G8929 Other chronic pain: Secondary | ICD-10-CM | POA: Diagnosis not present

## 2019-03-10 DIAGNOSIS — F251 Schizoaffective disorder, depressive type: Secondary | ICD-10-CM

## 2019-03-10 NOTE — Assessment & Plan Note (Signed)
Off Sinemet she is not exhibiting definite parkinsonian features.  Neurology follow-up once the [facility] is lifted.

## 2019-03-10 NOTE — Telephone Encounter (Signed)
This encounter was created in error - please disregard. This encounter was created in error - please disregard. This encounter was created in error - please disregard. 

## 2019-03-10 NOTE — Assessment & Plan Note (Addendum)
Patient exhibits more evidence of dementia rather than schizoaffective disorder at this time.  She is a very poor historian, but is not exhibiting active hallucinations at this time.  Overall her mental status appears to have improved with deprescribing of multiple agents. Psychiatry follow-up as soon as possible after [employer] is terminated.

## 2019-03-10 NOTE — Progress Notes (Signed)
NURSING HOME LOCATION:  [facility] ROOM NUMBER:[id number]    CODE STATUS: Full Code   PCP:  Gillermo Murdoch MD  This is a nursing facility follow up for specific acute issue of back pain.  Interim medical record and care since last [facility] visit was updated with review of diagnostic studies and change in clinical status since last visit were documented.  HPI: The patient complains of back pain but is unable to quantitate it or qualitate it.  She describes it as "back crumbles, guess it is back pain".  Lidocaine patches are prescribed for the chronic low back pain.  Because of possible risk of UTI ,urinalysis and culture and sensitivity were collected 4/21.  The urine was positive for nitrates and 2+ bacteria but only 0-5 white cells.  Final culture revealed 60,000 colonies of Proteus mirabilis.  Empirically she had been started on nitrofurantoin 100 mg twice a day; final sensitivities reveal the organism is resistant to this.  Because the colony count was less than 100,000; no true urinary tract infection was felt to be present and the Macrobid was discontinued and not replaced. Her dementia severely impacts ability to obtain adequate history.  She gave the date as September 12, 20??.  She denied pain in the flank areas which I designated by placing my palms in those areas.  She also denies any active GU symptoms.  She did describe both constipation and diarrhea.  She then validated she does not have any watery stool.  Review of systems: Overall she is "not very good".  She describes diffuse numbness and tingling if she sits for a while.  States that my feet and legs "do not want to work".  Constitutional: No fever, significant weight change, fatigue  Eyes: No redness, discharge, pain, vision change ENT/mouth: No nasal congestion,  purulent discharge, earache, change in hearing, sore throat  Cardiovascular: No chest pain, palpitations, paroxysmal nocturnal dyspnea, claudication,  edema  Respiratory: No cough, sputum production, hemoptysis, DOE, significant snoring, apnea   Gastrointestinal: No heartburn, dysphagia, abdominal pain, nausea /vomiting, rectal bleeding, melena Genitourinary: No dysuria, hematuria, pyuria Dermatologic: No rash, pruritus, change in appearance of skin Neurologic: No dizziness, headache, syncope, seizures Psychiatric: No significant anxiety, depression, insomnia, anorexia Endocrine: No change in hair/skin/nails, excessive thirst, excessive hunger, excessive urination  Hematologic/lymphatic: No significant bruising, lymphadenopathy, abnormal bleeding Allergy/immunology: No itchy/watery eyes, significant sneezing, urticaria, angioedema  Physical exam:  Pertinent or positive findings: She sits in the wheelchair with her head and trunk leaning to the right.  She is able to voluntarily sit up straight and straighten her neck.  Facies are blank.  She has bilateral ptosis.  Affect is markedly flat.  Minimal eschar at the corner of the mouth on the left.  Heart rhythm and rate are slow but regular.  Posterior tibial pulses are decreased compared to dorsalis pedis pulses.  Reflexes in the upper extremities are 1&1/2+.  She is symmetrically weak in all extremities to opposition.  She has intermittent side to side tremor of the right hand which worsens with purposeful activity of the right hand.  Follows commands poorly.  When she yawned I asked if I were boring her and she laughed. Compared to her prior mental state she is significantly improved.  There is some fusiform enlargement of the knees without effusion.  There is some resistant or increased tone with range of motion of the upper and lower extremities without definite cogwheeling.  She describes some tenderness with light percussion over  the mid lumbosacral spine.  There was no pain with elevation of the lower extremities.  General appearance: Adequately nourished; no acute distress, increased work of  breathing is present.   Lymphatic: No lymphadenopathy about the head, neck, axilla. Eyes: No conjunctival inflammation or lid edema is present. There is no scleral icterus. Ears:  External ear exam shows no significant lesions or deformities.   Nose:  External nasal examination shows no deformity or inflammation. Nasal mucosa are pink and moist without lesions, exudates Oral exam:  Lips and gums are healthy appearing. There is no oropharyngeal erythema or exudate. Neck:  No thyromegaly, masses, tenderness noted.    Heart:  No gallop, murmur, click, rub .  Lungs: Chest clear to auscultation without wheezes, rhonchi, rales, rubs. Abdomen: Bowel sounds are normal. Abdomen is soft and nontender with no organomegaly, hernias, masses. GU: Deferred  Extremities:  No cyanosis, clubbing, edema  Neurologic exam : Balance, Rhomberg, finger to nose testing could not be completed due to clinical state Skin: Warm & dry w/o tenting. No significant lesions or rash.  See summary under each active problem in the Problem List with associated updated therapeutic plan

## 2019-03-10 NOTE — Patient Instructions (Signed)
See assessment and plan under each diagnosis in the problem list and acutely for this visit 

## 2019-03-10 NOTE — Assessment & Plan Note (Addendum)
Denies actual flank pain.  Urine culture reveals less than 100,000 colonies of Proteus; UTI not documented. Some tenderness to percussion over the mid lumbosacral spine.  Negative straight leg raising.  Lidocaine patches will be continued.

## 2019-03-15 ENCOUNTER — Other Ambulatory Visit: Payer: Self-pay | Admitting: Adult Health

## 2019-03-15 ENCOUNTER — Non-Acute Institutional Stay (SKILLED_NURSING_FACILITY): Payer: Medicare Other | Admitting: Adult Health

## 2019-03-15 ENCOUNTER — Encounter: Payer: Self-pay | Admitting: Adult Health

## 2019-03-15 DIAGNOSIS — I10 Essential (primary) hypertension: Secondary | ICD-10-CM

## 2019-03-15 DIAGNOSIS — F0150 Vascular dementia without behavioral disturbance: Secondary | ICD-10-CM

## 2019-03-15 DIAGNOSIS — G8929 Other chronic pain: Secondary | ICD-10-CM

## 2019-03-15 DIAGNOSIS — K219 Gastro-esophageal reflux disease without esophagitis: Secondary | ICD-10-CM

## 2019-03-15 DIAGNOSIS — J45909 Unspecified asthma, uncomplicated: Secondary | ICD-10-CM

## 2019-03-15 DIAGNOSIS — M545 Low back pain: Secondary | ICD-10-CM

## 2019-03-15 DIAGNOSIS — F251 Schizoaffective disorder, depressive type: Secondary | ICD-10-CM

## 2019-03-15 DIAGNOSIS — F419 Anxiety disorder, unspecified: Secondary | ICD-10-CM

## 2019-03-15 MED ORDER — TRAMADOL HCL 50 MG PO TABS: 50.0000 mg | ORAL_TABLET | Freq: Three times a day (TID) | ORAL | 0 refills | Status: DC | PRN

## 2019-03-15 NOTE — Progress Notes (Signed)
Location:  [facility] Room Number: 225/B Place of Service:  SNF (31) Provider:  Kenard Gower, NP  Patient Care Team: Sherron Monday, MD as PCP - General (Internal Medicine) Center, [facility] (Skilled [facility] Facility)  Extended Emergency Contact Information Primary Emergency Contact: [name] of [place] Home Phone: [phone] Relation: Daughter Secondary Emergency Contact: [name] [place] of [place] Home Phone: [phone] Relation: Daughter  Code Status:  Full Code  Goals of care: Advanced Directive information Advanced Directives 03/15/2019  Does Patient Have a Medical Advance Directive? Yes  Type of Advance Directive (No Data)  Does patient want to make changes to medical advance directive? No - Patient declined  Would patient like information on creating a medical advance directive? -  Pre-existing out of facility DNR order (yellow form or pink MOST form) -     Chief Complaint  Patient presents with  . Medical Management of Chronic Issues    Routine visit of medical management    HPI:  Pt is a 73 y.o. female seen today for medical management of chronic diseases.  She has a PMH of Parkinson's disease, fibromyalgia, dementia, TIA and GERD.  She was seen in her room today.  She denies having hallucinations.  No reported agitation. BPs 130/64, 128/67, 113/68, 115/68, 128/72.  She denies having pain on her legs.   Past Medical History:  Diagnosis Date  . Allergy   . Anxiety   . Arthritis   . Asthma   . Benign essential hypertension 03/18/2017  . Cataract   . Dementia (HCC)   . Depression   . Difficulty walking   . Dysphagia, oral phase   . Fibromyalgia   . GERD (gastroesophageal reflux disease)   . Hallucinations   . Hyperlipidemia   . Hypertension   . Lower extremity edema   . Parkinson disease (HCC)   . Repeated falls   . Rhabdomyolysis   . Stroke (HCC)   . TIA (transient ischemic  attack)   . Urinary incontinence    Past Surgical History:  Procedure Laterality Date  . ABDOMINAL HYSTERECTOMY    . CATARACT EXTRACTION    . CHOLECYSTECTOMY    . TONSILLECTOMY AND ADENOIDECTOMY      Allergies  Allergen Reactions  . Hydrocodone Other (See Comments)    GI upset    Outpatient Encounter Medications as of 03/15/2019  Medication Sig  . albuterol (PROVENTIL HFA;VENTOLIN HFA) 108 (90 Base) MCG/ACT inhaler Inhale 2 puffs into the lungs every 6 (six) hours as needed for wheezing or shortness of breath.  Marland Kitchen amLODipine (NORVASC) 10 MG tablet Take 10 mg by mouth daily.  Marland Kitchen aspirin EC 81 MG tablet Take 81 mg by mouth daily.  . bisacodyl (BISACODYL LAXATIVE) 10 MG suppository Place 10 mg rectally as needed for moderate constipation.  . busPIRone (BUSPAR) 5 MG tablet Take 15 mg by mouth 3 (three) times daily. For anxiety  . calcium carbonate (TUMS - DOSED IN MG ELEMENTAL CALCIUM) 500 MG chewable tablet Chew 1 tablet by mouth every 6 (six) hours as needed for indigestion or heartburn.  . cholecalciferol (VITAMIN D) 1000 units tablet Take 1,000 Units by mouth daily.  Marland Kitchen donepezil (ARICEPT) 10 MG tablet Take 10 mg by mouth at bedtime.  . DULoxetine (CYMBALTA) 30 MG capsule Give 3 tablets ( ) by mouth daily  . fluticasone (FLONASE) 50 MCG/ACT nasal spray Place 2 sprays into both nostrils daily.  Marland Kitchen gabapentin (NEURONTIN) 100 MG capsule Take 100  mg by mouth at bedtime.  . lidocaine (LIDODERM) 5 % Place 2 patches onto the skin daily. Apply two patches tyo lower back daily for pain  . lithium carbonate 150 MG capsule Take 300 mg by mouth at bedtime.   Marland Kitchen loperamide (IMODIUM) 2 MG capsule Take 2 mg by mouth every 6 (six) hours as needed for diarrhea or loose stools.  Marland Kitchen loratadine (CLARITIN) 10 MG tablet Take 10 mg by mouth daily as needed for allergies.   Marland Kitchen LORazepam (ATIVAN) 1 MG tablet Take 1 tablet (1 mg total) by mouth every 8 (eight) hours.  . Melatonin 3 MG TABS Take 1 tablet by mouth  at bedtime.   . memantine (NAMENDA) 10 MG tablet Take 10 mg by mouth 2 (two) times daily.  . Menthol, Topical Analgesic, (BIOFREEZE) 4 % GEL Apply 1 application topically 2 (two) times daily. For posterior neck pain and bilateral knees  . ondansetron (ZOFRAN) 4 MG tablet Take 4 mg by mouth every 6 (six) hours as needed for nausea or vomiting.  . pantoprazole (PROTONIX) 40 MG tablet Take 40 mg by mouth daily.  . polyethylene glycol (MIRALAX / GLYCOLAX) packet Take 17 g by mouth daily as needed.   . traMADol (ULTRAM) 50 MG tablet Take 1 tablet (50 mg total) by mouth every 8 (eight) hours.  . ziprasidone (GEODON) 80 MG capsule Give one by mouth twice a day for Psychosis   No facility-administered encounter medications on file as of 03/15/2019.     Review of Systems unable to obtain due to dementia    Immunization History  Administered Date(s) Administered  . Influenza-Unspecified 03/17/2017, 09/09/2017  . PPD Test 03/24/2017, 06/02/2018, 06/08/2018  . Pneumococcal Polysaccharide-23 03/17/2017, 06/12/2018   Pertinent  Health Maintenance Due  Topic Date Due  . INFLUENZA VACCINE  06/18/2019  . DEXA SCAN  Discontinued  . COLONOSCOPY  Discontinued  . PNA vac Low Risk Adult  Discontinued   Fall Risk  05/27/2018 05/14/2017  Falls in the past year? No Yes  Number falls in past yr: - 2 or more  Injury with Fall? - No     Vitals:   03/15/19 1508  BP: 130/64  Pulse: 87  Resp: 20  Temp: 98 F (36.7 C)  TempSrc: Oral  Weight: 161 lb 12.8 oz (73.4 kg)  Height: 5\' 5"  (1.651 m)   Body mass index is 26.92 kg/m.  Physical Exam  GENERAL APPEARANCE: Well nourished. In no acute distress. Normal body habitus SKIN:  Skin is warm and dry.  MOUTH and THROAT: Lips are without lesions. Oral mucosa is moist and without lesions. Tongue is normal in shape, size, and color and without lesions RESPIRATORY: Breathing is even & unlabored, BS CTAB CARDIAC: RRR, no murmur,no extra heart sounds, no  edema GI: Abdomen soft, normal BS, no masses, no tenderness EXTREMITIES:  Able to move X 4 extremities  NEUROLOGICAL: There is no tremor. Speech is clear. Alert to self, disoriented to time and place. PSYCHIATRIC:  Affect and behavior are appropriate   Labs reviewed: Recent Labs    07/19/18 07/21/18 12/17/18 0800  NA 138 141 139  K 3.5 3.4 3.4*  CL  --   --  104  CO2  --   --  27  GLUCOSE  --   --  98  BUN 15 15 25*  CREATININE 0.7 0.9 1.14*  CALCIUM  --   --  9.3   Recent Labs    07/21/18 08/05/18 12/17/18 0800  AST 79* 22 17  ALT 82* 7 9  ALKPHOS 652* 228* 77  BILITOT  --   --  0.6  PROT  --   --  7.3  ALBUMIN  --   --  4.4   Recent Labs    07/02/18 07/19/18 12/17/18 0800  WBC 5.2 4.5 8.9  NEUTROABS 3 3 5.7  HGB 12.8 13.8 12.9  HCT 36 40 39.0  MCV  --   --  93.5  PLT 260 185 271   Lab Results  Component Value Date   TSH 2.86 07/02/2018   Lab Results  Component Value Date   HGBA1C 5.4 03/15/2018   Lab Results  Component Value Date   CHOL 139 03/15/2018   HDL 46 03/15/2018   LDLCALC 74 03/15/2018   TRIG 97 03/15/2018     Assessment/Plan  1. Chronic bilateral low back pain without sciatica -She denies having pain on her lower back, will change tramadol 50 mg from every 8 hours to to PRN, continue Biofreeze 4% gel twice a day as needed, lidocaine 5% 2 patches to lower back daily, gabapentin 100 mg 1 capsule at bedtime  2. Benign essential hypertension -Well-controlled, continue amlodipine 5 mg daily  3. Schizoaffective disorder, depressive type (HCC) -Stable, continue ziprasidone 80 mg 1 capsule twice a day, duloxetine 30 mg 3 capsules = 90 mg daily, lithium carbonate 300 mg 1 capsule at bedtime  4. Asthma with allergic rhinitis without complication, unspecified asthma severity -No wheezing no SOB, continue albuterol as needed, fluticasone 50 mcg spray 2 sprays in each nostril daily  5. GERD without esophagitis -Stable, continue pantoprazole 40 mg  1 tab daily  6. Anxiety -Mood is stable, continue Ativan 1 mg 1 tab every 8 hours and buspirone 15 mg 1 tab 3 times a day  7. Vascular dementia without behavioral disturbance (HCC) -Stable, continue memantine 10 mg 1 tab twice a day and donepezil 10 mg 1 tab at bedtime, fall precautions and supportive care    Family/ staff Communication: Discussed plan of care with resident and charge nurse.  Labs/tests ordered: None  Goals of care:   Long-term care   Kenard GowerMonina Medina-Vargas, NP [facility] [phone] (Monday-Friday 8:00 a.m. - 5:00 p.m.) [phone] (after hours)

## 2019-03-16 ENCOUNTER — Other Ambulatory Visit: Payer: Self-pay | Admitting: Internal Medicine

## 2019-03-16 MED ORDER — TRAMADOL HCL 50 MG PO TABS: 50.0000 mg | ORAL_TABLET | Freq: Three times a day (TID) | ORAL | 0 refills | Status: DC | PRN

## 2019-03-18 ENCOUNTER — Non-Acute Institutional Stay (SKILLED_NURSING_FACILITY): Payer: Medicare Other | Admitting: Internal Medicine

## 2019-03-18 ENCOUNTER — Encounter: Payer: Self-pay | Admitting: Internal Medicine

## 2019-03-18 DIAGNOSIS — F0150 Vascular dementia without behavioral disturbance: Secondary | ICD-10-CM | POA: Diagnosis not present

## 2019-03-18 DIAGNOSIS — M2560 Stiffness of unspecified joint, not elsewhere classified: Secondary | ICD-10-CM | POA: Diagnosis not present

## 2019-03-18 NOTE — Progress Notes (Signed)
Location:    [facility] Room Number: 225/B Place of Service:  SNF (31) Provider:  Lillie Columbia, MD  Patient Care Team: Sherron Monday, MD as PCP - General (Internal Medicine) Center, [facility] ([facility])  Extended Emergency Contact Information Primary Emergency Contact: [name] of [place] Home Phone: [phone] Relation: Daughter Secondary Emergency Contact: [name] [place] of [place] Home Phone: [phone] Relation: Daughter  Code Status:  Full Code Goals of care: Advanced Directive information Advanced Directives 03/18/2019  Does Patient Have a Medical Advance Directive? Yes  Type of Advance Directive (No Data)  Does patient want to make changes to medical advance directive? No - Patient declined  Would patient like information on creating a medical advance directive? -  Pre-existing out of facility DNR order (yellow form or pink MOST form) -    Chief complaint-acute visit secondary to stiffness   HPI:  Pt is a 73 y.o. female seen today for an acute visit secondary to  therapy is concerned with stiffness during physical therapy. She does have a history of Parkinson's disease  Dementia  chronic back pain hypertension schizoaffective disorder   Physical therapy feels she is has some increased stiffness aggressive hindered her progression with physical therapy   She does have a history of Parkinson's but is no longer on Sinemet  Which has been discontinued secondary to concerns of overmedication with numerous medications and thus medications were trimmed with hopes that her mental status would improve somewhat   She does continue on tramadol which was recently made as needed she also has Biofreeze and lidocaine patch Neurontin at night  Currently she is lying in bed she appears to be comfortable did does state her legs do hurt at times.  Apparently  she does take her tramadol some per discussion with nursing   Her vital signs appear to be stable other than leg discomfort she does not really have complaints today.  She is a poor historian secondary to dementia   Past Medical History:  Diagnosis Date  . Allergy   . Anxiety   . Arthritis   . Asthma   . Benign essential hypertension 03/18/2017  . Cataract   . Dementia (HCC)   . Depression   . Difficulty walking   . Dysphagia, oral phase   . Fibromyalgia   . GERD (gastroesophageal reflux disease)   . Hallucinations   . Hyperlipidemia   . Hypertension   . Lower extremity edema   . Parkinson disease (HCC)   . Repeated falls   . Rhabdomyolysis   . Stroke (HCC)   . TIA (transient ischemic attack)   . Urinary incontinence    Past Surgical History:  Procedure Laterality Date  . ABDOMINAL HYSTERECTOMY    . CATARACT EXTRACTION    . CHOLECYSTECTOMY    . TONSILLECTOMY AND ADENOIDECTOMY      Allergies  Allergen Reactions  . Hydrocodone Other (See Comments)    GI upset    Outpatient Encounter Medications as of 03/18/2019  Medication Sig  . albuterol (PROVENTIL HFA;VENTOLIN HFA) 108 (90 Base) MCG/ACT inhaler Inhale 2 puffs into the lungs every 6 (six) hours as needed for wheezing or shortness of breath.  Marland Kitchen amLODipine (NORVASC) 10 MG tablet Take 5 mg by mouth daily.   Marland Kitchen aspirin EC 81 MG tablet Take 81 mg by mouth daily.  . bisacodyl (BISACODYL LAXATIVE) 10 MG suppository Place 10  mg rectally as needed for moderate constipation.  . busPIRone (BUSPAR) 5 MG tablet Take 15 mg by mouth 3 (three) times daily. For anxiety  . calcium carbonate (TUMS - DOSED IN MG ELEMENTAL CALCIUM) 500 MG chewable tablet Chew 1 tablet by mouth every 6 (six) hours as needed for indigestion or heartburn.  . cholecalciferol (VITAMIN D) 1000 units tablet Take 1,000 Units by mouth daily.  Marland Kitchen donepezil (ARICEPT) 10 MG tablet Take 10 mg by mouth at bedtime.  . DULoxetine (CYMBALTA) 30 MG capsule Give 3 tablets  ( ) by mouth daily  . fluticasone (FLONASE) 50 MCG/ACT nasal spray Place 2 sprays into both nostrils daily.  Marland Kitchen gabapentin (NEURONTIN) 100 MG capsule Take 100 mg by mouth at bedtime.  . lidocaine (LIDODERM) 5 % Place 2 patches onto the skin daily. Apply two patches tyo lower back daily for pain  . lithium carbonate 150 MG capsule Take 300 mg by mouth at bedtime.   Marland Kitchen loperamide (IMODIUM) 2 MG capsule Take 2 mg by mouth every 6 (six) hours as needed for diarrhea or loose stools.  Marland Kitchen loratadine (CLARITIN) 10 MG tablet Take 10 mg by mouth daily as needed for allergies.   Marland Kitchen LORazepam (ATIVAN) 1 MG tablet Take 1 tablet (1 mg total) by mouth every 8 (eight) hours.  . Melatonin 3 MG TABS Take 1 tablet by mouth at bedtime.   . memantine (NAMENDA) 10 MG tablet Take 10 mg by mouth 2 (two) times daily.  . Menthol, Topical Analgesic, (BIOFREEZE) 4 % GEL Apply 1 application topically 2 (two) times daily. For posterior neck pain and bilateral knees  . ondansetron (ZOFRAN) 4 MG tablet Take 4 mg by mouth every 6 (six) hours as needed for nausea or vomiting.  . pantoprazole (PROTONIX) 40 MG tablet Take 40 mg by mouth daily.  . polyethylene glycol (MIRALAX / GLYCOLAX) packet Take 17 g by mouth daily as needed.   . traMADol (ULTRAM) 50 MG tablet Take 1 tablet (50 mg total) by mouth every 8 (eight) hours as needed.  . ziprasidone (GEODON) 80 MG capsule Give one by mouth twice a day for Psychosis   No facility-administered encounter medications on file as of 03/18/2019.     Review of Systems  This is limited secondary to dementia  General she is not complaining of fever chills  Skin does not complain of rashes or itching  Head ears eyes nose mouth and throat does not complain of sore throat or visual changes  Respiratory does not complain of a cough or shortness of breath  Cardiac does not complain of chest pain or edema.  GI does not complain of abdominal pain nausea or vomiting.  GU no complaints of  dysuria.  Musculoskeletal does complain of leg pain bilateral this is somewhat generalized pain- also has complained of back pain- per therapy there is some concern about stiffness.  Neurologic does not complain of dizziness headache or numbness at this point-she does continue on Neurontin at night  for previous complaints of numbness  Psych she does have history of schizoaffective disorder with dementia nursing does not report recent behaviors      Immunization History  Administered Date(s) Administered  . Influenza-Unspecified 03/17/2017, 09/09/2017  . PPD Test 03/24/2017, 06/02/2018, 06/08/2018  . Pneumococcal Polysaccharide-23 03/17/2017, 06/12/2018   Pertinent  Health Maintenance Due  Topic Date Due  . INFLUENZA VACCINE  06/18/2019  . DEXA SCAN  Discontinued  . COLONOSCOPY  Discontinued  . PNA vac Low Risk Adult  Discontinued   Fall Risk  05/27/2018 05/14/2017  Falls in the past year? No Yes  Number falls in past yr: - 2 or more  Injury with Fall? - No   Functional Status Survey:    Vitals:   03/18/19 1125  BP: 134/74  Pulse: 70  Resp: 18  Temp: 98 F (36.7 C)  TempSrc: Oral  SpO2: 94%  Weight: 161 lb 12.8 oz (73.4 kg)  Height: 5\' 5"  (1.651 m)   Body mass index is 26.92 kg/m. Physical Exam In general this is a pleasant elderly female in no distress-she appears to be comfortable lying in bed  Skin is warm and dry   Eyes visual acuity appears to be intact sclera and conjunctive are clear   Chest is clear to auscultation there is no labored breathing  Heart is regular rate and rhythm without murmur gallop or rub she has scant lower extremity edema pedal pulses are palpable bilaterally   Abdomen is soft nontender with positive bowel sounds   Musculoskeletal does have stiffness of her lower extremities bilaterally she is able to lift her legs to some extent- and does have some flexion and extension at the knee with passive range of motion but this is  limited secondary to the stiffness  Cannot really appreciate tenderness to palpation of the hips   Neurologic she appears to have an intentional tremor when asked her to hold my pen tremor became evident a bit more on the right upper extremity versus the left.  Cranial nerves appear to be grossly intact her speech is clear   Psych she is oriented to self she is pleasant follows simple verbal commands without difficulty- Labs reviewed: Recent Labs    07/19/18 07/21/18 12/17/18 0800  NA 138 141 139  K 3.5 3.4 3.4*  CL  --   --  104  CO2  --   --  27  GLUCOSE  --   --  98  BUN 15 15 25*  CREATININE 0.7 0.9 1.14*  CALCIUM  --   --  9.3   Recent Labs    07/21/18 08/05/18 12/17/18 0800  AST 79* 22 17  ALT 82* 7 9  ALKPHOS 652* 228* 77  BILITOT  --   --  0.6  PROT  --   --  7.3  ALBUMIN  --   --  4.4   Recent Labs    07/02/18 07/19/18 12/17/18 0800  WBC 5.2 4.5 8.9  NEUTROABS 3 3 5.7  HGB 12.8 13.8 12.9  HCT 36 40 39.0  MCV  --   --  93.5  PLT 260 185 271   Lab Results  Component Value Date   TSH 2.86 07/02/2018   Lab Results  Component Value Date   HGBA1C 5.4 03/15/2018   Lab Results  Component Value Date   CHOL 139 03/15/2018   HDL 46 03/15/2018   LDLCALC 74 03/15/2018   TRIG 97 03/15/2018     Significant Diagnostic Results in last 30 days:  No results found.  Assessment/Plan  #1 history of stiffness history of Parkinson's disease-I did discuss this with Dr. Alwyn RenHopper via phone-at this point hesitant to bring back Sinemet secondary attempts to wean off medications since she has been on an extensive number of them in the past  Dr. Alwyn RenHopper feels her mental status has improved significantly since medications were weaned so there is some concern with bringing back medications.  At this point will make tramadol routine before therapy-but  this will have to be monitored I did discuss this with therapy x2 today- and will haver follow-up with Dr. Alwyn Ren next week   Dr.  Alwyn Ren would prefer that she sees her neurologist for follow-up on this there are orders for neurology follow-up but this has been complicated by the coronavirus issue which has delayed her seeing her neurologist --there are  orders for followup  She does continue on topical Lido Derm patch as well as Biofreeze she is also on Cymbalta for depression as well as help with pain management --and has Neurontin at night In addition to tramadol 50 mg every 8 hours as needed  At this point continue to monitor continues to be somewhat of a challenge concerns for increased confusion with increasing medication versus stiffness challenges with therapy.  [id number] of note greater than 25 minutes spent assessing patient discussing her status with Dr. Alwyn Ren as well as with therapy- and coordinating a plan of care-of note greater than 50% of time spent coordinating a plan of care with input as noted above

## 2019-03-23 ENCOUNTER — Encounter: Payer: Self-pay | Admitting: Internal Medicine

## 2019-03-23 ENCOUNTER — Non-Acute Institutional Stay (SKILLED_NURSING_FACILITY): Payer: Medicare Other | Admitting: Internal Medicine

## 2019-03-23 DIAGNOSIS — M545 Low back pain, unspecified: Secondary | ICD-10-CM

## 2019-03-23 DIAGNOSIS — G8929 Other chronic pain: Secondary | ICD-10-CM | POA: Diagnosis not present

## 2019-03-23 DIAGNOSIS — F0150 Vascular dementia without behavioral disturbance: Secondary | ICD-10-CM | POA: Diagnosis not present

## 2019-03-23 DIAGNOSIS — G20 Parkinson's disease: Secondary | ICD-10-CM | POA: Diagnosis not present

## 2019-03-23 NOTE — Assessment & Plan Note (Addendum)
Patient cannot provide date.  Aricept and Namenda will be continued as dementia may be multifactorial.  Discontinuing these may result in progression and deterioration.

## 2019-03-23 NOTE — Assessment & Plan Note (Addendum)
03/23/2019 clinically she does appear parkinsonian and this appears to be a major component to her inability to participate with PT/OT.  The patient has changed dramatically since I took her off the Sinemet.  It will be renewed.

## 2019-03-23 NOTE — Progress Notes (Signed)
This encounter was created in error - please disregard.

## 2019-03-23 NOTE — Progress Notes (Deleted)
   NURSING HOME LOCATION:  [facility] ROOM NUMBER:  [id number]  CODE STATUS:  Full Code  PCP:  Sherron Monday, MD  [address] [place] [address]   This is a nursing facility follow up for specific acute issue of    Interim medical record and care since last [facility] visit was updated with review of diagnostic studies and change in clinical status since last visit were documented.  HPI:  Review of systems: Dementia invalidated responses. Date given as   Constitutional: No fever, significant weight change, fatigue  Eyes: No redness, discharge, pain, vision change ENT/mouth: No nasal congestion,  purulent discharge, earache, change in hearing, sore throat  Cardiovascular: No chest pain, palpitations, paroxysmal nocturnal dyspnea, claudication, edema  Respiratory: No cough, sputum production, hemoptysis, DOE, significant snoring, apnea   Gastrointestinal: No heartburn, dysphagia, abdominal pain, nausea /vomiting, rectal bleeding, melena, change in bowels Genitourinary: No dysuria, hematuria, pyuria, incontinence, nocturia Musculoskeletal: No joint stiffness, joint swelling, weakness, pain Dermatologic: No rash, pruritus, change in appearance of skin Neurologic: No dizziness, headache, syncope, seizures, numbness, tingling Psychiatric: No significant anxiety, depression, insomnia, anorexia Endocrine: No change in hair/skin/nails, excessive thirst, excessive hunger, excessive urination  Hematologic/lymphatic: No significant bruising, lymphadenopathy, abnormal bleeding Allergy/immunology: No itchy/watery eyes, significant sneezing, urticaria, angioedema  Physical exam:  Pertinent or positive findings: General appearance: Adequately nourished; no acute distress, increased work of breathing is present.   Lymphatic: No lymphadenopathy about the head, neck, axilla. Eyes: No conjunctival inflammation or lid edema is present. There is no scleral icterus. Ears:   External ear exam shows no significant lesions or deformities.   Nose:  External nasal examination shows no deformity or inflammation. Nasal mucosa are pink and moist without lesions, exudates Oral exam:  Lips and gums are healthy appearing. There is no oropharyngeal erythema or exudate. Neck:  No thyromegaly, masses, tenderness noted.    Heart:  Normal rate and regular rhythm. S1 and S2 normal without gallop, murmur, click, rub .  Lungs: Chest clear to auscultation without wheezes, rhonchi, rales, rubs. Abdomen: Bowel sounds are normal. Abdomen is soft and nontender with no organomegaly, hernias, masses. GU: Deferred  Extremities:  No cyanosis, clubbing, edema  Neurologic exam : Cn 2-7 intact Strength equal  in upper & lower extremities Balance, Rhomberg, finger to nose testing could not be completed due to clinical state Deep tendon reflexes are equal Skin: Warm & dry w/o tenting. No significant lesions or rash.  See summary under each active problem in the Problem List with associated updated therapeutic plan

## 2019-03-23 NOTE — Progress Notes (Signed)
NURSING HOME LOCATION:  [facility] ROOM NUMBER:  225-B  CODE STATUS:  Full Code  PCP:  Sherron Mondayejan-Sie, S Ahmed, MD  [address] [place] [id number]  This is a nursing facility follow up for specific issue of LBP.  Interim medical record and care since last [facility] visit was updated with review of diagnostic studies and change in clinical status since last visit were documented.  HPI: [facility] PT and OT have been hesitant to work with the patient because complaints of low back pain in the context of generalized weakness and increased rigidity.  This is in the context of my having weaned and discontinued the Sinemet.My concern & that of her daughter had been that the parkinsonian picture was iatrogenic related to her polypharmacy including multiple psychotropic drugs. Problem list includes a diagnosis of chronic bilateral low back pain without sciatica as well as primary osteoarthritis involving multiple joints.  Remotely the patient had a laminectomy at L4- 5.  This was documented on an MRI in 2018 performed by Dr. Terrace ArabiaYan.  Also present was convex to the right with facet hypertrophy, degenerative disc disease, minimal spondylolisthesis, left greater than right lateral recess foraminal narrowing. She had previously been on Mobic 7.5 mg daily as needed.  She receives 2 lidocaine patches to the lower back daily.  She is also on Biofreeze topically to the neck as needed for pain. She had CT of the cervical spine 12/17/2018 following an unwitnessed fall.  This revealed moderate degenerative disc disease at C5 - C6 but no acute changes.  Also on 02/05/2018 films of the left hip revealed mild osteoarthritis.  She has a history of vitamin D deficiency and is on 1000 units daily; remotely she had been on 50,000 units weekly. Significant past history reveals remote rhabdomyolysis, fibromyalgia, schizophrenia with hallucinations, dementia and depression. As noted she is on multiple  psychotropic drugs.  Review of systems: Dementia invalidated responses. Date given as "third or fourth".  She then said "I hate this question".  She states that she feels "bad" because of "so much pain".  This is chiefly in the "lower body and hips" but she also describes pain from the top of her head to the bottom of her feet.  She has associated numbness in her feet.  She denies incontinence of stool or urine.  She seems to indicate the most severe pain is in the lower sacral area.  She states that she will have pain down both lower extremities, "both in the front and back".  Physical exam:  Pertinent or positive findings: Facies are blank.  Responses are slow.  She sat in the wheelchair leaning 15 degrees to the left.  There was a support pad on the right but not on the left.  She seemed to have an intermittent jerking tremor of the right hand at rest.  With movement she had a tremor of the left hand as well.  At rest with the right hand on the right thigh it did really resemble more of a parkinsonian tremor.  Cogwheeling of the upper extremities was suggested.  Deep tendon reflexes in the knees are 0+.  Straight leg raising was negative bilaterally.  She was weak to opposition in all extremities.  Posterior tibial pulses were decreased.  General appearance: Adequately nourished; no acute distress, increased work of breathing is present.   Lymphatic: No lymphadenopathy about the head, neck, axilla. Eyes: No conjunctival inflammation or lid edema is present. There is no scleral icterus.  Ears:  External ear exam shows no significant lesions or deformities.   Nose:  External nasal examination shows no deformity or inflammation. Nasal mucosa are pink and moist without lesions, exudates Oral exam:  Lips and gums are healthy appearing. There is no oropharyngeal erythema or exudate. Neck:  No thyromegaly, masses, tenderness noted.    Heart:  Normal rate and regular rhythm. S1 and S2 normal without gallop,  murmur, click, rub .  Lungs: Chest clear to auscultation without wheezes, rhonchi, rales, rubs. Abdomen: Bowel sounds are normal. Abdomen is soft and nontender with no organomegaly, hernias, masses. GU: Deferred  Extremities:  No cyanosis, clubbing, edema  Skin: Warm & dry w/o tenting. No significant lesions or rash.  See summary under each active problem in the Problem List with associated updated therapeutic plan

## 2019-03-23 NOTE — Assessment & Plan Note (Addendum)
She basically hurts from head to toe in the context of history of fibromyalgia.Negative SLR on exam.  Mobic will be Rxed bid X 7 days as Sinemet restarted. Posturing in the wheelchair may be a component; this was discussed with PT.

## 2019-03-23 NOTE — Patient Instructions (Signed)
See assessment and plan under each diagnosis in the problem list and acutely for this visit 

## 2019-04-04 ENCOUNTER — Other Ambulatory Visit: Payer: Self-pay | Admitting: Internal Medicine

## 2019-04-04 MED ORDER — LORAZEPAM 1 MG PO TABS: 1.0000 mg | ORAL_TABLET | Freq: Three times a day (TID) | ORAL | 0 refills | Status: DC

## 2019-04-05 ENCOUNTER — Non-Acute Institutional Stay (SKILLED_NURSING_FACILITY): Payer: Medicare Other | Admitting: Adult Health

## 2019-04-05 ENCOUNTER — Encounter: Payer: Self-pay | Admitting: Adult Health

## 2019-04-05 DIAGNOSIS — F251 Schizoaffective disorder, depressive type: Secondary | ICD-10-CM | POA: Diagnosis not present

## 2019-04-05 DIAGNOSIS — M545 Low back pain: Secondary | ICD-10-CM

## 2019-04-05 DIAGNOSIS — F0150 Vascular dementia without behavioral disturbance: Secondary | ICD-10-CM | POA: Diagnosis not present

## 2019-04-05 DIAGNOSIS — G8929 Other chronic pain: Secondary | ICD-10-CM | POA: Diagnosis not present

## 2019-04-05 DIAGNOSIS — J45909 Unspecified asthma, uncomplicated: Secondary | ICD-10-CM | POA: Diagnosis not present

## 2019-04-05 DIAGNOSIS — M150 Primary generalized (osteo)arthritis: Secondary | ICD-10-CM | POA: Diagnosis not present

## 2019-04-05 DIAGNOSIS — I10 Essential (primary) hypertension: Secondary | ICD-10-CM | POA: Diagnosis not present

## 2019-04-05 DIAGNOSIS — M159 Polyosteoarthritis, unspecified: Secondary | ICD-10-CM

## 2019-04-05 DIAGNOSIS — F419 Anxiety disorder, unspecified: Secondary | ICD-10-CM | POA: Diagnosis not present

## 2019-04-05 DIAGNOSIS — K219 Gastro-esophageal reflux disease without esophagitis: Secondary | ICD-10-CM

## 2019-04-05 DIAGNOSIS — G20 Parkinson's disease: Secondary | ICD-10-CM | POA: Diagnosis not present

## 2019-04-05 NOTE — Progress Notes (Signed)
Location:  [facility] Room Number: 225-B Place of Service:  SNF (31) Provider:  Kenard Gower, DNP, FNP-BC  Patient Care Team: Pecola Lawless, MD as PCP - General (Internal Medicine) Center, [facility] ([facility]) Synetta Shadow as Physician Assistant (Internal Medicine)  Extended Emergency Contact Information Primary Emergency Contact: [name] of [place] Home Phone: [phone] Relation: Daughter Secondary Emergency Contact: [name] [place] of [place] Home Phone: [phone] Relation: Daughter  Code Status:  Full Code  Goals of care: Advanced Directive information Advanced Directives 03/18/2019  Does Patient Have a Medical Advance Directive? Yes  Type of Advance Directive (No Data)  Does patient want to make changes to medical advance directive? No - Patient declined  Would patient like information on creating a medical advance directive? -  Pre-existing out of facility DNR order (yellow form or pink MOST form) -     Chief Complaint  Patient presents with  . Medical Management of Chronic Issues    Routine [facility] visit    HPI:  Pt is a 73 y.o. female seen today for medical management of chronic diseases.  She is a long-term care resident of [facility]. She has a PMH of Parkinson's disease, fibromyalgia, dementia, TIA, and GERD. She was seen in the room today. She was reported to be having confusion. Charge nurse reported that she recently completed antibiotic treatment for UTI. No reported fever, wheezing nor SOB. BPs noted to be low -84/52, 110/60, 108/58. She denies dizziness.   Past Medical History:  Diagnosis Date  . Allergy   . Anxiety   . Arthritis   . Asthma   . Benign essential hypertension 03/18/2017  . Cataract   . Dementia (HCC)   . Depression   . Difficulty walking   . Dysphagia, oral phase   . Fibromyalgia   . GERD  (gastroesophageal reflux disease)   . Hallucinations   . Hyperlipidemia   . Hypertension   . Lower extremity edema   . Parkinson disease (HCC)   . Repeated falls   . Rhabdomyolysis   . Stroke (HCC)   . TIA (transient ischemic attack)   . Urinary incontinence    Past Surgical History:  Procedure Laterality Date  . ABDOMINAL HYSTERECTOMY    . CATARACT EXTRACTION    . CHOLECYSTECTOMY    . TONSILLECTOMY AND ADENOIDECTOMY      Allergies  Allergen Reactions  . Hydrocodone Other (See Comments)    GI upset    Outpatient Encounter Medications as of 04/05/2019  Medication Sig  . albuterol (PROVENTIL HFA;VENTOLIN HFA) 108 (90 Base) MCG/ACT inhaler Inhale 2 puffs into the lungs every 6 (six) hours as needed for wheezing or shortness of breath.  Marland Kitchen amLODipine (NORVASC) 5 MG tablet Take 5 mg by mouth daily. Hold for SBP <105  . aspirin EC 81 MG tablet Take 81 mg by mouth daily.  . bisacodyl (BISACODYL LAXATIVE) 10 MG suppository Place 10 mg rectally as needed for moderate constipation.  . busPIRone (BUSPAR) 15 MG tablet Take 15 mg by mouth 3 (three) times daily.  . calcium carbonate (TUMS - DOSED IN MG ELEMENTAL CALCIUM) 500 MG chewable tablet Chew 1 tablet by mouth every 6 (six) hours as needed for indigestion or heartburn.  . carbidopa-levodopa (SINEMET IR) 25-100 MG tablet Take 1 tablet by mouth 3 (three) times daily.  . cholecalciferol (VITAMIN D) 1000 units tablet Take 1,000 Units by mouth daily.   Marland Kitchen  donepezil (ARICEPT) 10 MG tablet Take 10 mg by mouth at bedtime.   . DULoxetine (CYMBALTA) 30 MG capsule Give 3 tablets ( ) by mouth daily  . fluticasone (FLONASE) 50 MCG/ACT nasal spray Place 2 sprays into both nostrils daily.   Marland Kitchen gabapentin (NEURONTIN) 100 MG capsule Take 100 mg by mouth at bedtime.   . lidocaine (LIDODERM) 5 % Place 2 patches onto the skin daily. Apply two patches tyo lower back daily for pain  . lithium carbonate 300 MG capsule Take 300 mg by mouth at bedtime.  Marland Kitchen  loperamide (IMODIUM) 2 MG capsule Take 2 mg by mouth every 6 (six) hours as needed for diarrhea or loose stools.  Marland Kitchen loratadine (CLARITIN) 10 MG tablet Take 10 mg by mouth daily as needed for allergies.   Marland Kitchen LORazepam (ATIVAN) 1 MG tablet Take 1 tablet (1 mg total) by mouth every 8 (eight) hours.  . Melatonin 3 MG TABS Take 1 tablet by mouth at bedtime.   . memantine (NAMENDA) 10 MG tablet Take 10 mg by mouth 2 (two) times daily.   . Menthol, Topical Analgesic, (BIOFREEZE) 4 % GEL Apply 1 application topically 2 (two) times daily. For posterior neck pain, bilateral knees, and left arm.  . ondansetron (ZOFRAN) 4 MG tablet Take 4 mg by mouth every 6 (six) hours as needed for nausea or vomiting.  . pantoprazole (PROTONIX) 40 MG tablet Take 40 mg by mouth daily.   . polyethylene glycol (MIRALAX / GLYCOLAX) packet Take 17 g by mouth daily as needed.   . traMADol (ULTRAM) 50 MG tablet Take 1 tablet (50 mg total) by mouth every 8 (eight) hours as needed.  . traMADol (ULTRAM) 50 MG tablet Take 50 mg by mouth See admin instructions. Give one hour prior to PT, hold for sedation or respiratory depression  . ziprasidone (GEODON) 80 MG capsule Give one by mouth twice a day for Psychosis   No facility-administered encounter medications on file as of 04/05/2019.     Review of Systems  Unable to obtain due to dementia      Immunization History  Administered Date(s) Administered  . Influenza-Unspecified 03/17/2017, 09/09/2017, 08/17/2018  . PPD Test 03/24/2017, 06/02/2018, 06/08/2018  . Pneumococcal Polysaccharide-23 03/17/2017, 06/12/2018   Pertinent  Health Maintenance Due  Topic Date Due  . INFLUENZA VACCINE  06/18/2019  . DEXA SCAN  Discontinued  . COLONOSCOPY  Discontinued  . PNA vac Low Risk Adult  Discontinued   Fall Risk  05/27/2018 05/14/2017  Falls in the past year? No Yes  Number falls in past yr: - 2 or more  Injury with Fall? - No     Vitals:   04/05/19 1042  BP: 110/60  Pulse: 74   Resp: 20  Temp: 98 F (36.7 C)  TempSrc: Oral  SpO2: 94%  Weight: 158 lb (71.7 kg)  Height:  (1.651 m)   Body mass index is 26.29 kg/m.  Physical Exam  GENERAL APPEARANCE: Well nourished. In no acute distress. Normal body habitus SKIN:  Skin is warm and dry.  MOUTH and THROAT: Lips are without lesions. Oral mucosa is moist and without lesions.  RESPIRATORY: Breathing is even & unlabored, BS CTAB CARDIAC: RRR, no murmur,no extra heart sounds, no edema GI: Abdomen soft, normal BS, no masses, no tenderness EXTREMITIES:  Able to move X 4 extremities NEUROLOGICAL: There is no tremor. Speech is clear. Alert to self, disoriented to time and place. PSYCHIATRIC:  Affect and behavior are appropriate  Labs reviewed: Recent Labs    07/19/18 07/21/18 12/17/18 0800  NA 138 141 139  K 3.5 3.4 3.4*  CL  --   --  104  CO2  --   --  27  GLUCOSE  --   --  98  BUN 15 15 25*  CREATININE 0.7 0.9 1.14*  CALCIUM  --   --  9.3   Recent Labs    07/21/18 08/05/18 12/17/18 0800  AST 79* 22 17  ALT 82* 7 9  ALKPHOS 652* 228* 77  BILITOT  --   --  0.6  PROT  --   --  7.3  ALBUMIN  --   --  4.4   Recent Labs    07/02/18 07/19/18 12/17/18 0800  WBC 5.2 4.5 8.9  NEUTROABS 3 3 5.7  HGB 12.8 13.8 12.9  HCT 36 40 39.0  MCV  --   --  93.5  PLT 260 185 271   Lab Results  Component Value Date   TSH 2.86 07/02/2018   Lab Results  Component Value Date   HGBA1C 5.4 03/15/2018   Lab Results  Component Value Date   CHOL 139 03/15/2018   HDL 46 03/15/2018   LDLCALC 74 03/15/2018   TRIG 97 03/15/2018    Assessment/Plan  1. Benign essential hypertension - since BPs are low, will discontinue Amlodipine, check BP BID X 1 week  2. Asthma with allergic rhinitis without complication, unspecified asthma severity - no SOB nor wheezing, continue albuterol nebs as needed, loratadine as needed and fluticasone 50 mcg spray 2 sprays to each nostril daily  3. Schizoaffective disorder,  depressive type (HCC) -Stable, continue ziprasidone 80 mg 1 capsule twice a day, duloxetine 30 mg 3 capsules = 90 mg daily, lithium carbonate 300 mg 1 capsule at bedtime  4. GERD without esophagitis -Continue pantoprazole 40 mg 1 tab daily  5. Parkinson disease (HCC) -Stable, continue carbidopa-levodopa 25- 100 tab 3 times a day  6. Anxiety -Stable, continue Ativan 1 mg 1 tab every 8 hours and buspirone 15 mg 1 tab 3 times a day  7. Primary osteoarthritis involving multiple joints -Continue Biofreeze 4% gel twice a day to neck and bilateral knees and PRN and lidocaine 5% patch 2 patches to lower back daily  8. Vascular dementia without behavioral disturbance (HCC) -Continue memantine 10 mg 1 tab twice a day and donepezil 10 mg 1 tab at bedtime  9.  Chronic bilateral low back pain without sciatica -Well-controlled, continue tramadol 50 mg 1 tab every 8 hours PRN and daily prior to physical therapy, gabapentin 100 mg 1 capsule at bedtime   Family/ staff Communication: Discussed plan of care with resident and charge nurse.  Labs/tests ordered:  UA with CS  Goals of care:   Long-term care.   Kenard GowerMonina Medina-Vargas, DNP, FNP-BC [facility] [phone] (Monday-Friday 8:00 a.m. - 5:00 p.m.) [phone] (after hours)

## 2019-04-06 ENCOUNTER — Encounter: Payer: Self-pay | Admitting: Internal Medicine

## 2019-04-06 ENCOUNTER — Non-Acute Institutional Stay (SKILLED_NURSING_FACILITY): Payer: Medicare Other | Admitting: Internal Medicine

## 2019-04-06 DIAGNOSIS — K5900 Constipation, unspecified: Secondary | ICD-10-CM

## 2019-04-06 DIAGNOSIS — I952 Hypotension due to drugs: Secondary | ICD-10-CM

## 2019-04-06 NOTE — Progress Notes (Signed)
Location:    [facility] Room Number: 225/B Place of Service:  SNF [id number]) Provider:  Adrian Prows, MD  Patient Care Team: Pecola Lawless, MD as PCP - General (Internal Medicine) [facility]) Synetta Shadow as Physician Assistant (Internal Medicine)  Extended Emergency Contact Information Primary Emergency Contact: [name] of [place] Home Phone: [phone] Relation: Daughter Secondary Emergency Contact: [name] [place] of [place] Home Phone: [phone] Relation: Daughter  Code Status:  Full Code Goals of care: Advanced Directive information Advanced Directives 04/06/2019  Does Patient Have a Medical Advance Directive? Yes  Type of Advance Directive (No Data)  Does patient want to make changes to medical advance directive? No - Patient declined  Would patient like information on creating a medical advance directive? -  Pre-existing out of facility DNR order (yellow form or pink MOST form) -     Chief Complaint  Patient presents with  . Acute Visit    Possible Constipation     HPI:  Pt is a 73 y.o. female seen today for an acute visit for nursing note left about possible constipation.  Also follow-up of hypertension.  He is a long-term resident of facility with a history of Parkinson's disease fibromyalgia history of TIA as well as GERD and dementia.  She was seen yesterday for routine visit and her blood pressure was noted to be low her Norvasc was discontinued  I see a listed blood pressure of 94/56 this morning when I took it later in the day is 110/64 took it manually  There is a nursing note about possible constipation.  However I spoke with her nurse today who is quite familiar with her and she was not aware of any constipation  issues apparently had a good bowel movement on Monday  She is on as needed MiraLAX.  Patient  is a poor historian secondary to dementia but she is actually working some today with speech therapy and according to them she is doing better actually feeding herself today which is progress.      Past Medical History:  Diagnosis Date  . Allergy   . Anxiety   . Arthritis   . Asthma   . Benign essential hypertension 03/18/2017  . Cataract   . Dementia (HCC)   . Depression   . Difficulty walking   . Dysphagia, oral phase   . Fibromyalgia   . GERD (gastroesophageal reflux disease)   . Hallucinations   . Hyperlipidemia   . Hypertension   . Lower extremity edema   . Parkinson disease (HCC)   . Repeated falls   . Rhabdomyolysis   . Stroke (HCC)   . TIA (transient ischemic attack)   . Urinary incontinence    Past Surgical History:  Procedure Laterality Date  . ABDOMINAL HYSTERECTOMY    . CATARACT EXTRACTION    . CHOLECYSTECTOMY    . TONSILLECTOMY AND ADENOIDECTOMY      Allergies  Allergen Reactions  . Hydrocodone Other (See Comments)    GI upset    Outpatient Encounter Medications as of 04/06/2019  Medication Sig  . albuterol (PROVENTIL HFA;VENTOLIN HFA) 108 (90 Base) MCG/ACT inhaler Inhale 2 puffs into the lungs every 6 (six) hours as needed for wheezing or shortness of breath.  Marland Kitchen aspirin EC 81 MG tablet Take 81 mg by mouth daily.  . bisacodyl (BISACODYL LAXATIVE) 10 MG suppository Place 10  mg rectally as needed for moderate constipation.  . busPIRone (BUSPAR) 15 MG tablet Take 15 mg by mouth 3 (three) times daily.  . calcium carbonate (TUMS - DOSED IN MG ELEMENTAL CALCIUM) 500 MG chewable tablet Chew 1 tablet by mouth every 6 (six) hours as needed for indigestion or heartburn.  . carbidopa-levodopa (SINEMET IR) 25-100 MG tablet Take 1 tablet by mouth 3 (three) times daily.  . cholecalciferol (VITAMIN D) 1000 units tablet Take 1,000 Units by mouth daily.   Marland Kitchen. donepezil (ARICEPT) 10 MG tablet Take 10 mg by mouth at bedtime.   . DULoxetine (CYMBALTA) 30 MG capsule Give 3  tablets (90mg ) by mouth daily  . fluticasone (FLONASE) 50 MCG/ACT nasal spray Place 2 sprays into both nostrils daily.   Marland Kitchen. gabapentin (NEURONTIN) 100 MG capsule Take 100 mg by mouth at bedtime.   . lidocaine (LIDODERM) 5 % Place 2 patches onto the skin daily. Apply two patches tyo lower back daily for pain  . lithium carbonate 300 MG capsule Take 300 mg by mouth at bedtime.  Marland Kitchen. loperamide (IMODIUM) 2 MG capsule Take 2 mg by mouth every 6 (six) hours as needed for diarrhea or loose stools.  Marland Kitchen. loratadine (CLARITIN) 10 MG tablet Take 10 mg by mouth daily as needed for allergies.   Marland Kitchen. LORazepam (ATIVAN) 2 MG tablet Take 2 mg by mouth every 12 (twelve) hours.  . Melatonin 3 MG TABS Take 1 tablet by mouth at bedtime.   . memantine (NAMENDA) 10 MG tablet Take 10 mg by mouth 2 (two) times daily.   . Menthol, Topical Analgesic, (BIOFREEZE) 4 % GEL Apply 1 application topically 2 (two) times daily. For posterior neck pain, bilateral knees, and left arm.  . ondansetron (ZOFRAN) 4 MG tablet Take 4 mg by mouth every 6 (six) hours as needed for nausea or vomiting.  . pantoprazole (PROTONIX) 40 MG tablet Take 40 mg by mouth daily.   . polyethylene glycol (MIRALAX / GLYCOLAX) packet Take 17 g by mouth daily as needed.   . traMADol (ULTRAM) 50 MG tablet Take 1 tablet (50 mg total) by mouth every 8 (eight) hours as needed.  . traMADol (ULTRAM) 50 MG tablet Take 50 mg by mouth See admin instructions. Give one hour prior to PT, hold for sedation or respiratory depression  . ziprasidone (GEODON) 80 MG capsule Give one by mouth twice a day for Psychosis  . [DISCONTINUED] amLODipine (NORVASC) 5 MG tablet Take 5 mg by mouth daily. Hold for SBP <105  . [DISCONTINUED] LORazepam (ATIVAN) 1 MG tablet Take 1 tablet (1 mg total) by mouth every 8 (eight) hours. (Patient taking differently: Take 1 mg by mouth every 12 (twelve) hours. )   No facility-administered encounter medications on file as of 04/06/2019.     Review of  Systems   Is largely unobtainable secondary to dementia please see HPI-nursing does not report any acute issues- patient is not complaining of any discomfort or pain at this time.    Immunization History  Administered Date(s) Administered  . Influenza-Unspecified 03/17/2017, 09/09/2017, 08/17/2018  . PPD Test 03/24/2017, 06/02/2018, 06/08/2018  . Pneumococcal Polysaccharide-23 03/17/2017, 06/12/2018   Pertinent  Health Maintenance Due  Topic Date Due  . INFLUENZA VACCINE  06/18/2019  . DEXA SCAN  Discontinued  . COLONOSCOPY  Discontinued  . PNA vac Low Risk Adult  Discontinued   Fall Risk  05/27/2018 05/14/2017  Falls in the past year? No Yes  Number falls in past yr: - 2  or more  Injury with Fall? - No   Functional Status Survey:    Vitals:   04/06/19 1055  BP: (!) 94/56  Pulse: 68  Resp: 19  Temp: 97.6 F (36.4 C)  TempSrc: Oral  SpO2: 94%  Weight: 158 lb (71.7 kg)  Height:  (1.651 m)  Updated manual blood pressure is 110/64 Body mass index is 26.29 kg/m. Physical Exam In general this is a pleasant elderly female in no distress sitting comfortably in her wheelchair.  Her skin is warm and dry.  Eyes visual acuity appears to be intact pupils appear to be reactive to light sclera and conjunctive are clear   oropharynx is clear mucous membranes moist.  Chest is clear to auscultation with somewhat poor respiratory effort  Abdomen is soft nontender with positive bowel sounds  Muscleskeletal-limited exam since she is in a wheelchair does have generalized stiffness of her lower extremities with a history of Parkinson's   she actually is feeding herself today which shows somewhat improved use of her upper extremities  Neurologic upper extremity intentional tremorappears somewhat improved today-- alert does make eye contact does not speak very much Appears relatively at baseline.  Psych she is oriented to self she does follow simple verbal commands speaks  minimally  Labs reviewed: Recent Labs    07/19/18 07/21/18 12/17/18 0800  NA 138 141 139  K 3.5 3.4 3.4*  CL  --   --  104  CO2  --   --  27  GLUCOSE  --   --  98  BUN 15 15 25*  CREATININE 0.7 0.9 1.14*  CALCIUM  --   --  9.3   Recent Labs    07/21/18 08/05/18 12/17/18 0800  AST 79* 22 17  ALT 82* 7 9  ALKPHOS 652* 228* 77  BILITOT  --   --  0.6  PROT  --   --  7.3  ALBUMIN  --   --  4.4   Recent Labs    07/02/18 07/19/18 12/17/18 0800  WBC 5.2 4.5 8.9  NEUTROABS 3 3 5.7  HGB 12.8 13.8 12.9  HCT 36 40 39.0  MCV  --   --  93.5  PLT 260 185 271   Lab Results  Component Value Date   TSH 2.86 07/02/2018   Lab Results  Component Value Date   HGBA1C 5.4 03/15/2018   Lab Results  Component Value Date   CHOL 139 03/15/2018   HDL 46 03/15/2018   LDLCALC 74 03/15/2018   TRIG 97 03/15/2018    Significant Diagnostic Results in last 30 days:  No results found.  Assessment/Plan  #1 constipation I did speak to her nurse today-at this point will make MiraLAX routine and monitor- according to her nurse today she has not really noted persistent constipation but this will have to be watched.  2.  Hypertension her Norvasc has been discontinued manual reading today was reassuring at 110/64 at this point continue to monitor she does not appear to be symptomatic of any hypotension symptoms   [id number].

## 2019-04-08 ENCOUNTER — Non-Acute Institutional Stay (SKILLED_NURSING_FACILITY): Payer: Medicare Other | Admitting: Adult Health

## 2019-04-08 ENCOUNTER — Encounter: Payer: Self-pay | Admitting: Adult Health

## 2019-04-08 DIAGNOSIS — M150 Primary generalized (osteo)arthritis: Secondary | ICD-10-CM | POA: Diagnosis not present

## 2019-04-08 DIAGNOSIS — N390 Urinary tract infection, site not specified: Secondary | ICD-10-CM

## 2019-04-08 DIAGNOSIS — M159 Polyosteoarthritis, unspecified: Secondary | ICD-10-CM

## 2019-04-08 DIAGNOSIS — F419 Anxiety disorder, unspecified: Secondary | ICD-10-CM

## 2019-04-08 DIAGNOSIS — B9620 Unspecified Escherichia coli [E. coli] as the cause of diseases classified elsewhere: Secondary | ICD-10-CM

## 2019-04-08 NOTE — Progress Notes (Signed)
Location:  [facility] Room Number: 225/B Place of Service:  SNF (31) Provider:  Kenard GowerMedina-Vargas, Monina, DNP, FNP-BC  Patient Care Team: Pecola LawlessHopper, William F, MD as PCP - General (Internal Medicine) Center, [facility] (Skilled Nursing Facility) Synetta ShadowLassen, Arlo C, PA-C as Physician Assistant (Internal Medicine)  Extended Emergency Contact Information Primary Emergency Contact: Levy Pupaamsy,[name]  [place] Home Phone: [phone] Relation: Daughter Secondary Emergency Contact: [name] PaymentMiller,Amy  [place] Home Phone: [phone] Relation: Daughter  Code Status: Full Code   Goals of care: Advanced Directive information Advanced Directives 04/08/2019  Does Patient Have a Medical Advance Directive? Yes  Type of Advance Directive (No Data)  Does patient want to make changes to medical advance directive? No - Patient declined  Would patient like information on creating a medical advance directive? -  Pre-existing out of facility DNR order (yellow form or pink MOST form) -     Chief Complaint  Patient presents with  . Acute Visit    UTI    HPI:  Pt is a 73 y.o. female seen today for an abnormal urine culture. She was noted to be more confused. Ativan was recently decreased from Q 8 hours to Q 12 hours. Urine culture showed >100,000 CFU/ml fermenting gram negative rods, E. Coli, positive nitrites, urine wbc 5-10 and urine bacteria 2+. No reported fever. She was seen in her room today. She complained of pain on bilateral knees. No edema noted. No bruising.   Past Medical History:  Diagnosis Date  . Allergy   . Anxiety   . Arthritis   . Asthma   . Benign essential hypertension 03/18/2017  . Cataract   . Dementia (HCC)   . Depression   . Difficulty walking   . Dysphagia, oral phase   . Fibromyalgia   . GERD (gastroesophageal reflux disease)   . Hallucinations   . Hyperlipidemia   . Hypertension   . Lower extremity edema   .  Parkinson disease (HCC)   . Repeated falls   . Rhabdomyolysis   . Stroke (HCC)   . TIA (transient ischemic attack)   . Urinary incontinence    Past Surgical History:  Procedure Laterality Date  . ABDOMINAL HYSTERECTOMY    . CATARACT EXTRACTION    . CHOLECYSTECTOMY    . TONSILLECTOMY AND ADENOIDECTOMY      Allergies  Allergen Reactions  . Hydrocodone Other (See Comments)    GI upset    Outpatient Encounter Medications as of 04/08/2019  Medication Sig  . albuterol (PROVENTIL HFA;VENTOLIN HFA) 108 (90 Base) MCG/ACT inhaler Inhale 2 puffs into the lungs every 6 (six) hours as needed for wheezing or shortness of breath.  Marland Kitchen. aspirin EC 81 MG tablet Take 81 mg by mouth daily.  . bisacodyl (BISACODYL LAXATIVE) 10 MG suppository Place 10 mg rectally as needed for moderate constipation.  . busPIRone (BUSPAR) 15 MG tablet Take 15 mg by mouth 3 (three) times daily.  . calcium carbonate (TUMS - DOSED IN MG ELEMENTAL CALCIUM) 500 MG chewable tablet Chew 1 tablet by mouth every 6 (six) hours as needed for indigestion or heartburn.  . carbidopa-levodopa (SINEMET IR) 25-100 MG tablet Take 1 tablet by mouth 3 (three) times daily.  . cholecalciferol (VITAMIN D) 1000 units tablet Take 1,000 Units by mouth daily.   Marland Kitchen. donepezil (ARICEPT) 10 MG tablet Take 10 mg by mouth at bedtime.   . DULoxetine (CYMBALTA) 30 MG capsule Give 3 tablets (90mg ) by mouth daily  .  fluticasone (FLONASE) 50 MCG/ACT nasal spray Place 2 sprays into both nostrils daily.   Marland Kitchen gabapentin (NEURONTIN) 100 MG capsule Take 100 mg by mouth at bedtime.   . lidocaine (LIDODERM) 5 % Place 2 patches onto the skin daily. Apply two patches tyo lower back daily for pain  . lithium carbonate 300 MG capsule Take 300 mg by mouth at bedtime.  Marland Kitchen loperamide (IMODIUM) 2 MG capsule Take 2 mg by mouth every 6 (six) hours as needed for diarrhea or loose stools.  Marland Kitchen loratadine (CLARITIN) 10 MG tablet Take 10 mg by mouth daily as needed for allergies.   Marland Kitchen  LORazepam (ATIVAN) 2 MG tablet Take 2 mg by mouth every 12 (twelve) hours.  . Melatonin 3 MG TABS Take 1 tablet by mouth at bedtime.   . memantine (NAMENDA) 10 MG tablet Take 10 mg by mouth 2 (two) times daily.   . Menthol, Topical Analgesic, (BIOFREEZE) 4 % GEL Apply 1 application topically 2 (two) times daily. For posterior neck pain, bilateral knees, and left arm.  . ondansetron (ZOFRAN) 4 MG tablet Take 4 mg by mouth every 6 (six) hours as needed for nausea or vomiting.  . pantoprazole (PROTONIX) 40 MG tablet Take 40 mg by mouth daily.   . polyethylene glycol (MIRALAX / GLYCOLAX) packet Take 17 g by mouth daily as needed.   . traMADol (ULTRAM) 50 MG tablet Take 1 tablet (50 mg total) by mouth every 8 (eight) hours as needed.  . traMADol (ULTRAM) 50 MG tablet Take 50 mg by mouth See admin instructions. Give one hour prior to PT, hold for sedation or respiratory depression  . ziprasidone (GEODON) 80 MG capsule Give one by mouth twice a day for Psychosis   No facility-administered encounter medications on file as of 04/08/2019.     Review of Systems   Unable to obtain due to dementia   Immunization History  Administered Date(s) Administered  . Influenza-Unspecified 03/17/2017, 09/09/2017, 08/17/2018  . PPD Test 03/24/2017, 06/02/2018, 06/08/2018  . Pneumococcal Polysaccharide-23 03/17/2017, 06/12/2018   Pertinent  Health Maintenance Due  Topic Date Due  . INFLUENZA VACCINE  06/18/2019  . DEXA SCAN  Discontinued  . COLONOSCOPY  Discontinued  . PNA vac Low Risk Adult  Discontinued   Fall Risk  05/27/2018 05/14/2017  Falls in the past year? No Yes  Number falls in past yr: - 2 or more  Injury with Fall? - No     Vitals:   04/08/19 1514  BP: 133/81  Pulse: 64  Resp: 20  Temp: (!) 97.4 F (36.3 C)  Weight: 158 lb (71.7 kg)  Height: 5\' 5"  (1.651 m)   Body mass index is 26.29 kg/m.  Physical Exam  GENERAL APPEARANCE: Well nourished. In no acute distress. Normal body habitus  SKIN:  Skin is warm and dry.  MOUTH and THROAT: Lips are without lesions. Oral mucosa is moist and without lesions. Tongue is normal in shape, size, and color and without lesions RESPIRATORY: Breathing is even & unlabored, BS CTAB CARDIAC: RRR, no murmur,no extra heart sounds, no edema GI: Abdomen soft, normal BS, no masses, no tenderness EXTREMITIES: Able to move X 4 extremities NEUROLOGICAL: There is no tremor. Speech is clear. Alert to self, disoriented to time and place. PSYCHIATRIC:  Affect and behavior are appropriate   Labs reviewed: Recent Labs    07/19/18 07/21/18 12/17/18 0800  NA 138 141 139  K 3.5 3.4 3.4*  CL  --   --  104  CO2  --   --  27  GLUCOSE  --   --  98  BUN 15 15 25*  CREATININE 0.7 0.9 1.14*  CALCIUM  --   --  9.3   Recent Labs    07/21/18 08/05/18 12/17/18 0800  AST 79* 22 17  ALT 82* 7 9  ALKPHOS 652* 228* 77  BILITOT  --   --  0.6  PROT  --   --  7.3  ALBUMIN  --   --  4.4   Recent Labs    07/02/18 07/19/18 12/17/18 0800  WBC 5.2 4.5 8.9  NEUTROABS 3 3 5.7  HGB 12.8 13.8 12.9  HCT 36 40 39.0  MCV  --   --  93.5  PLT 260 185 271   Lab Results  Component Value Date   TSH 2.86 07/02/2018   Lab Results  Component Value Date   HGBA1C 5.4 03/15/2018   Lab Results  Component Value Date   CHOL 139 03/15/2018   HDL 46 03/15/2018   LDLCALC 74 03/15/2018   TRIG 97 03/15/2018     Assessment/Plan  1. E-coli UTI - will start on Cipro 500 mg BID X 7 days and Florastor 250 mg 1 capsule BID X 10 days  2. Anxiety - mood is stable, continue Ativan 1 mg 1 tab Q 12 hours  3. Primary osteoarthritis involving multiple joints - start Tylenol Arthritis 650 mg 1 tab Q 6 hours PRN and Biofreeze apply topically to bilateral knees BID    Family/ staff Communication:  Discussed plan of care with resident and charge nurse.  Labs/tests ordered:  None  Goals of care:  Long-term care   Kenard Gower, DNP, FNP-BC [facility] [phone] (Monday-Friday 8:00 a.m. - 5:00 p.m.) [phone] (after hours)

## 2019-04-10 ENCOUNTER — Encounter: Payer: Self-pay | Admitting: Adult Health

## 2019-04-13 ENCOUNTER — Non-Acute Institutional Stay (SKILLED_NURSING_FACILITY): Payer: Medicare Other | Admitting: Adult Health

## 2019-04-13 ENCOUNTER — Other Ambulatory Visit: Payer: Self-pay | Admitting: Internal Medicine

## 2019-04-13 ENCOUNTER — Encounter: Payer: Self-pay | Admitting: Adult Health

## 2019-04-13 DIAGNOSIS — F0150 Vascular dementia without behavioral disturbance: Secondary | ICD-10-CM | POA: Diagnosis not present

## 2019-04-13 DIAGNOSIS — G4700 Insomnia, unspecified: Secondary | ICD-10-CM | POA: Diagnosis not present

## 2019-04-13 DIAGNOSIS — F419 Anxiety disorder, unspecified: Secondary | ICD-10-CM

## 2019-04-13 DIAGNOSIS — M545 Low back pain: Secondary | ICD-10-CM

## 2019-04-13 DIAGNOSIS — F251 Schizoaffective disorder, depressive type: Secondary | ICD-10-CM | POA: Diagnosis not present

## 2019-04-13 DIAGNOSIS — K219 Gastro-esophageal reflux disease without esophagitis: Secondary | ICD-10-CM | POA: Diagnosis not present

## 2019-04-13 DIAGNOSIS — G20 Parkinson's disease: Secondary | ICD-10-CM | POA: Diagnosis not present

## 2019-04-13 DIAGNOSIS — I10 Essential (primary) hypertension: Secondary | ICD-10-CM

## 2019-04-13 DIAGNOSIS — Z7189 Other specified counseling: Secondary | ICD-10-CM | POA: Diagnosis not present

## 2019-04-13 DIAGNOSIS — G8929 Other chronic pain: Secondary | ICD-10-CM

## 2019-04-13 DIAGNOSIS — J309 Allergic rhinitis, unspecified: Secondary | ICD-10-CM

## 2019-04-13 DIAGNOSIS — G20A1 Parkinson's disease without dyskinesia, without mention of fluctuations: Secondary | ICD-10-CM

## 2019-04-13 MED ORDER — TRAMADOL HCL 50 MG PO TABS: 50.0000 mg | ORAL_TABLET | Freq: Three times a day (TID) | ORAL | 0 refills | Status: DC | PRN

## 2019-04-13 NOTE — Progress Notes (Signed)
Location:  [facility] Room Number: 225B Place of Service:  SNF (31) Provider:  Kenard Gower, DNP, FNP-BC  Patient Care Team: Pecola Lawless, MD as PCP - General (Internal Medicine) Center, [facility] (Skilled Nursing [facility]) Synetta Shadow as Physician Assistant (Internal Medicine)  Extended Emergency Contact Information Primary Emergency Contact: [name] of [place] Home Phone: [phone] Relation: Daughter Secondary Emergency Contact: [name] [place] Home Phone: [phone] Relation: Daughter  Code Status:  Full Code  Goals of care: Advanced Directive information Advanced Directives 04/08/2019  Does Patient Have a Medical Advance Directive? Yes  Type of Advance Directive (No Data)  Does patient want to make changes to medical advance directive? No - Patient declined  Would patient like information on creating a medical advance directive? -  Pre-existing out of facility DNR order (yellow form or pink MOST form) -     Chief Complaint  Patient presents with  . Acute Visit    Advance Care Planning    HPI:  Pt is a 73 y.o. female seen today for a careplan meeting. The meeting was attended by NP, MDS coordinator, social worker and Ms. Reed Pandy (daughter) who attended via teleconference. Daughter was upset that the resident's psychotropic medications were decreased without telling her in advance. She said that her mother had a long history of psychiatric medications. Her Ativan dosage were lowered before but she started to become anxious, like things crawling out of her skin and not able to sleep. Informed her that she was noted to be sleepy so Ativan dosage was decreased from 1 mg Q 8 hours to Q 12 hours. So far, no reported anxiety episodes. She is sleeping at night. Daughter said that she talked to her on the phone and told her that she was just moved to another room and a man passed by her  (patient) and just dropped dead. Discussed that resident has an order for psych NP to see her when quarantine is over and if there is an urgent need then she will be seen immediately. Daughter would like resident to be resuscitated if they find her pulseless. She is currently having OT. Meeting lasted for 30 minutes.  Latest BP is 101/66, low, and Amlodipine was recently discontinued. She denies having dizziness. No increased in tremors noted.   Past Medical History:  Diagnosis Date  . Allergy   . Anxiety   . Arthritis   . Asthma   . Benign essential hypertension 03/18/2017  . Cataract   . Dementia (HCC)   . Depression   . Difficulty walking   . Dysphagia, oral phase   . Fibromyalgia   . GERD (gastroesophageal reflux disease)   . Hallucinations   . Hyperlipidemia   . Hypertension   . Lower extremity edema   . Parkinson disease (HCC)   . Repeated falls   . Rhabdomyolysis   . Stroke (HCC)   . TIA (transient ischemic attack)   . Urinary incontinence    Past Surgical History:  Procedure Laterality Date  . ABDOMINAL HYSTERECTOMY    . CATARACT EXTRACTION    . CHOLECYSTECTOMY    . TONSILLECTOMY AND ADENOIDECTOMY      Allergies  Allergen Reactions  . Hydrocodone Other (See Comments)    GI upset    Outpatient Encounter Medications as of 04/13/2019  Medication Sig  . albuterol (PROVENTIL HFA;VENTOLIN HFA) 108 (90 Base) MCG/ACT inhaler Inhale 2 puffs into the lungs every 6 (six)  hours as needed for wheezing or shortness of breath.  Marland Kitchen aspirin EC 81 MG tablet Take 81 mg by mouth daily.  . bisacodyl (BISACODYL LAXATIVE) 10 MG suppository Place 10 mg rectally as needed for moderate constipation.  . busPIRone (BUSPAR) 15 MG tablet Take 15 mg by mouth 3 (three) times daily.  . calcium carbonate (TUMS - DOSED IN MG ELEMENTAL CALCIUM) 500 MG chewable tablet Chew 1 tablet by mouth every 6 (six) hours as needed for indigestion or heartburn.  . carbidopa-levodopa (SINEMET IR) 25-100 MG  tablet Take 1 tablet by mouth 3 (three) times daily.  . cholecalciferol (VITAMIN D) 1000 units tablet Take 1,000 Units by mouth daily.   Marland Kitchen donepezil (ARICEPT) 10 MG tablet Take 10 mg by mouth at bedtime.   . DULoxetine (CYMBALTA) 30 MG capsule Give 3 tablets ( ) by mouth daily  . fluticasone (FLONASE) 50 MCG/ACT nasal spray Place 2 sprays into both nostrils daily.   Marland Kitchen gabapentin (NEURONTIN) 100 MG capsule Take 100 mg by mouth at bedtime.   . lidocaine (LIDODERM) 5 % Place 2 patches onto the skin daily. Apply two patches tyo lower back daily for pain  . lithium carbonate 300 MG capsule Take 300 mg by mouth at bedtime.  Marland Kitchen loperamide (IMODIUM) 2 MG capsule Take 2 mg by mouth every 6 (six) hours as needed for diarrhea or loose stools.  Marland Kitchen loratadine (CLARITIN) 10 MG tablet Take 10 mg by mouth daily as needed for allergies.   Marland Kitchen LORazepam (ATIVAN) 2 MG tablet Take 2 mg by mouth every 12 (twelve) hours.  . Melatonin 3 MG TABS Take 1 tablet by mouth at bedtime.   . memantine (NAMENDA) 10 MG tablet Take 10 mg by mouth 2 (two) times daily.   . Menthol, Topical Analgesic, (BIOFREEZE) 4 % GEL Apply 1 application topically 2 (two) times daily. For posterior neck pain, bilateral knees, and left arm.  . ondansetron (ZOFRAN) 4 MG tablet Take 4 mg by mouth every 6 (six) hours as needed for nausea or vomiting.  . pantoprazole (PROTONIX) 40 MG tablet Take 40 mg by mouth daily.   . polyethylene glycol (MIRALAX / GLYCOLAX) packet Take 17 g by mouth daily as needed.   . traMADol (ULTRAM) 50 MG tablet Take 1 tablet (50 mg total) by mouth every 8 (eight) hours as needed.  . traMADol (ULTRAM) 50 MG tablet Take 50 mg by mouth See admin instructions. Give one hour prior to PT, hold for sedation or respiratory depression  . ziprasidone (GEODON) 80 MG capsule Give one by mouth twice a day for Psychosis   No facility-administered encounter medications on file as of 04/13/2019.     Review of Systems  Unable to obtain due  to dementia      Immunization History  Administered Date(s) Administered  . Influenza-Unspecified 03/17/2017, 09/09/2017, 08/17/2018  . PPD Test 03/24/2017, 06/02/2018, 06/08/2018  . Pneumococcal Polysaccharide-23 03/17/2017, 06/12/2018   Pertinent  Health Maintenance Due  Topic Date Due  . INFLUENZA VACCINE  06/18/2019  . DEXA SCAN  Discontinued  . COLONOSCOPY  Discontinued  . PNA vac Low Risk Adult  Discontinued   Fall Risk  05/27/2018 05/14/2017  Falls in the past year? No Yes  Number falls in past yr: - 2 or more  Injury with Fall? - No     Vitals:   04/13/19 1536  BP: 101/66  Pulse: 72  Resp: 20  Temp: (!) 97.5 F (36.4 C)  Weight: 158 lb (71.7 kg)  Height: 5\' 5"  (1.651 m)   Body mass index is 26.29 kg/m.  Physical Exam  GENERAL APPEARANCE: Well nourished. In no acute distress. Normal body habitus SKIN:  Skin is warm and dry. MOUTH and THROAT: Lips are without lesions. Oral mucosa is moist and without lesions. Tongue is normal in shape, size, and color and without lesions RESPIRATORY: Breathing is even & unlabored, BS CTAB CARDIAC: RRR, no murmur,no extra heart sounds, no edema GI: Abdomen soft, normal BS, no masses, no tenderness EXTREMITIES:  Able to move X 4 extremities NEUROLOGICAL: There is no tremor. Speech is clear. Alert to self, disoriented to time and place. PSYCHIATRIC:  Affect and behavior are appropriate  Labs reviewed: Recent Labs    07/19/18 07/21/18 12/17/18 0800  NA 138 141 139  K 3.5 3.4 3.4*  CL  --   --  104  CO2  --   --  27  GLUCOSE  --   --  98  BUN 15 15 25*  CREATININE 0.7 0.9 1.14*  CALCIUM  --   --  9.3   Recent Labs    07/21/18 08/05/18 12/17/18 0800  AST 79* 22 17  ALT 82* 7 9  ALKPHOS 652* 228* 77  BILITOT  --   --  0.6  PROT  --   --  7.3  ALBUMIN  --   --  4.4   Recent Labs    07/02/18 07/19/18 12/17/18 0800  WBC 5.2 4.5 8.9  NEUTROABS 3 3 5.7  HGB 12.8 13.8 12.9  HCT 36 40 39.0  MCV  --   --  93.5  PLT  260 185 271   Lab Results  Component Value Date   TSH 2.86 07/02/2018   Lab Results  Component Value Date   HGBA1C 5.4 03/15/2018   Lab Results  Component Value Date   CHOL 139 03/15/2018   HDL 46 03/15/2018   LDLCALC 74 03/15/2018   TRIG 97 03/15/2018      Assessment/Plan  1. Benign essential hypertension - BP 101/66, will change amlodipine was recently discontinued due to hypotension, not on any antihypertensive medication   2. Parkinson disease (HCC) - no tremors noted, continue carbidopa-levodopa 25-100 mg 1 tab 3 times daily, follows up with Dr. Terrace ArabiaYan, neurology  3. GERD without esophagitis -Stable, continue pantoprazole 40 mg  daily  4. Chronic bilateral low back pain without sciatica -Stable, continue tramadol 50 mg every 8 hours PRN and Neurontin 100 mg 1 capsule Q HS  5. Schizoaffective disorder, depressive type (HCC) -No reported hallucinations no delusions-at this time, will continue Cymbalta 30 mg 3 capsules = 90 mg daily, Geodon 80 mg 1 capsule twice daily and lithium carbonate 300 mg 1 capsule daily, Psych consult   6. Insomnia, unspecified type - Sleeps well at night, continue Melatonin 3 mg Q HS  7. Alergic rhinitis, unspecified seasonality, unspecified trigger - no sneezing noted during the visit,  Continue Fluticasone 50 mcg 2 sprays into each nostril daily  8. Vascular dementia without behavioral disturbance (HCC) - continue Memantine 10 mg BID and Aricept 10 mg Q HS, fall precautions, continue supportive care  9. Advance care planning - discussed code status, medications, behavior, with IDT and daughter via teleconference  10. Anxiety  - Ativan was recently decreased from 1 mg Q 8 hours to Q 12 hours, mood is stable, will monitor behavior   Family/ staff Communication: Discussed plan of care with daughter and IDT.  Labs/tests ordered:  None  Goals of care:   Long-term care  Kenard Gower, DNP, FNP-BC [facility] [phone] (Monday-Friday 8:00 a.m. - 5:00 p.m.) [phone] (after hours)

## 2019-04-21 ENCOUNTER — Other Ambulatory Visit: Payer: Self-pay | Admitting: Internal Medicine

## 2019-04-21 ENCOUNTER — Encounter: Payer: Self-pay | Admitting: Internal Medicine

## 2019-04-21 ENCOUNTER — Non-Acute Institutional Stay (SKILLED_NURSING_FACILITY): Payer: Medicare Other | Admitting: Internal Medicine

## 2019-04-21 DIAGNOSIS — I959 Hypotension, unspecified: Secondary | ICD-10-CM

## 2019-04-21 DIAGNOSIS — F251 Schizoaffective disorder, depressive type: Secondary | ICD-10-CM

## 2019-04-21 DIAGNOSIS — F0151 Vascular dementia with behavioral disturbance: Secondary | ICD-10-CM | POA: Diagnosis not present

## 2019-04-21 DIAGNOSIS — G20 Parkinson's disease: Secondary | ICD-10-CM

## 2019-04-21 DIAGNOSIS — Z8659 Personal history of other mental and behavioral disorders: Secondary | ICD-10-CM

## 2019-04-21 DIAGNOSIS — F01518 Vascular dementia, unspecified severity, with other behavioral disturbance: Secondary | ICD-10-CM

## 2019-04-21 MED ORDER — LORAZEPAM 2 MG PO TABS: 1.0000 mg | ORAL_TABLET | Freq: Two times a day (BID) | ORAL | 0 refills | Status: DC

## 2019-04-21 NOTE — Progress Notes (Signed)
Location:    [facility] Home Room Number: 225/B Place of Service:  SNF [id number]) Provider:  Adrian Prows, MD  Patient Care Team: Pecola Lawless, MD as PCP - General (Internal Medicine) Center, [facility] (Skilled Nursing Facility) Synetta Shadow as Physician Assistant (Internal Medicine)  Extended Emergency Contact Information Primary Emergency Contact: [name] of [place] Home Phone: [phone] Relation: Daughter Secondary Emergency Contact: [name] [place] of [place] Home Phone: [phone] Relation: Daughter  Code Status:  Full Code Goals of care: Advanced Directive information Advanced Directives 04/21/2019  Does Patient Have a Medical Advance Directive? Yes  Type of Advance Directive (No Data)  Does patient want to make changes to medical advance directive? No - Patient declined  Would patient like information on creating a medical advance directive? -  Pre-existing out of facility DNR order (yellow form or pink MOST form) -    Chief complaint acute visit follow-up dementia with anxiety- on Ativan   HPI:  Pt is a 73 y.o. female seen today for an acute visit for follow-up of dementia with anxiety.  Patient is due for an Ativan refill- I have spoken with nursing staff and they feel that she does benefit significantly from this with a significant history of anxiety as well as psychosis.  Per review of previous notes it appears she does have an extensive history of this and her daughter feels strongly as well she should be on Ativan.  And apparently dose reductions were not tolerated well in the past  It appears the Ativan was reduced to twice daily recently secondary to some sedation concerns-.  She does not appear to be sedated today she is up in the wheelchair she is bright alert confused but pleasantly so.  Per nursing the Ativan has been pretty effective and  well-tolerated  She does have a history of psychosis and has a psychiatric nurse practitioner consult pending when the quarantine for coronavirus ends- she does continue on lithium 300 mg a day as well as Namenda 10 mg twice daily and Geodon 80 mg twice a day in addition to BuSpar 50 mg 3 times daily she is also on Aricept 10 mg a day   Past Medical History:  Diagnosis Date  . Allergy   . Anxiety   . Arthritis   . Asthma   . Benign essential hypertension 03/18/2017  . Cataract   . Dementia (HCC)   . Depression   . Difficulty walking   . Dysphagia, oral phase   . Fibromyalgia   . GERD (gastroesophageal reflux disease)   . Hallucinations   . Hyperlipidemia   . Hypertension   . Lower extremity edema   . Parkinson disease (HCC)   . Repeated falls   . Rhabdomyolysis   . Stroke (HCC)   . TIA (transient ischemic attack)   . Urinary incontinence    Past Surgical History:  Procedure Laterality Date  . ABDOMINAL HYSTERECTOMY    . CATARACT EXTRACTION    . CHOLECYSTECTOMY    . TONSILLECTOMY AND ADENOIDECTOMY      Allergies  Allergen Reactions  . Hydrocodone Other (See Comments)    GI upset    Outpatient Encounter Medications as of 04/21/2019  Medication Sig  . albuterol (PROVENTIL HFA;VENTOLIN HFA) 108 (90 Base) MCG/ACT inhaler Inhale 2 puffs into the lungs every 6 (six) hours as needed for wheezing or shortness of breath.  Marland Kitchen aspirin  EC 81 MG tablet Take 81 mg by mouth daily.  . bisacodyl (BISACODYL LAXATIVE) 10 MG suppository Place 10 mg rectally as needed for moderate constipation.  . busPIRone (BUSPAR) 15 MG tablet Take 15 mg by mouth 3 (three) times daily.  . calcium carbonate (TUMS - DOSED IN MG ELEMENTAL CALCIUM) 500 MG chewable tablet Chew 1 tablet by mouth every 6 (six) hours as needed for indigestion or heartburn.  . carbidopa-levodopa (SINEMET IR) 25-100 MG tablet Take 1 tablet by mouth 3 (three) times daily.  . cholecalciferol (VITAMIN D) 1000 units tablet Take 1,000  Units by mouth daily.   Marland Kitchen. donepezil (ARICEPT) 10 MG tablet Take 10 mg by mouth at bedtime.   . DULoxetine (CYMBALTA) 30 MG capsule Give 3 tablets (90mg ) by mouth daily  . fluticasone (FLONASE) 50 MCG/ACT nasal spray Place 2 sprays into both nostrils daily.   Marland Kitchen. gabapentin (NEURONTIN) 100 MG capsule Take 100 mg by mouth at bedtime.   . lidocaine (LIDODERM) 5 % Place 2 patches onto the skin daily. Apply two patches tyo lower back daily for pain  . lithium carbonate 300 MG capsule Take 300 mg by mouth at bedtime.  Marland Kitchen. loperamide (IMODIUM) 2 MG capsule Take 2 mg by mouth every 6 (six) hours as needed for diarrhea or loose stools.  Marland Kitchen. loratadine (CLARITIN) 10 MG tablet Take 10 mg by mouth daily as needed for allergies.   Marland Kitchen. LORazepam (ATIVAN) 2 MG tablet Take 0.5 tablets (1 mg total) by mouth every 12 (twelve) hours.  . Melatonin 3 MG TABS Take 1 tablet by mouth at bedtime.   . memantine (NAMENDA) 10 MG tablet Take 10 mg by mouth 2 (two) times daily.   . ondansetron (ZOFRAN) 4 MG tablet Take 4 mg by mouth every 6 (six) hours as needed for nausea or vomiting.  . pantoprazole (PROTONIX) 40 MG tablet Take 40 mg by mouth daily.   . polyethylene glycol (MIRALAX / GLYCOLAX) packet Take 17 g by mouth daily as needed.   . traMADol (ULTRAM) 50 MG tablet Take 50 mg by mouth See admin instructions. Give one hour prior to PT, hold for sedation or respiratory depression  . traMADol (ULTRAM) 50 MG tablet Take 1 tablet (50 mg total) by mouth every 8 (eight) hours as needed.  . ziprasidone (GEODON) 80 MG capsule Give one by mouth twice a day for Psychosis  . [DISCONTINUED] Menthol, Topical Analgesic, (BIOFREEZE) 4 % GEL Apply 1 application topically 2 (two) times daily. For posterior neck pain, bilateral knees, and left arm.   No facility-administered encounter medications on file as of 04/21/2019.     Review of Systems  Is largely unobtainable secondary to dementia please see HPI nursing is not really were noted any  significant behaviors or increased anxiety or increased sedation to my knowledge    Immunization History  Administered Date(s) Administered  . Influenza-Unspecified 03/17/2017, 09/09/2017, 08/17/2018  . PPD Test 03/24/2017, 06/02/2018, 06/08/2018  . Pneumococcal Polysaccharide-23 03/17/2017, 06/12/2018   Pertinent  Health Maintenance Due  Topic Date Due  . INFLUENZA VACCINE  06/18/2019  . DEXA SCAN  Discontinued  . COLONOSCOPY  Discontinued  . PNA vac Low Risk Adult  Discontinued   Fall Risk  05/27/2018 05/14/2017  Falls in the past year? No Yes  Number falls in past yr: - 2 or more  Injury with Fall? - No   Functional Status Survey:    Temperature is 97.7 pulse 80 respirations 20 blood pressure 112/64  Physical  Exam   In general this is a fairly well-nourished elderly female no distress sitting comfortably in her wheelchair.  Her skin is warm and dry.  Eyes visual acuity appears to be intact sclera and conjunctive are clear.  Oropharynx is clear mucous membranes moist.  Chest is clear to auscultation there is no labored breathing.  Heart is regular rate and rhythm without murmur gallop or rub she has trace mild lower extremity edema bilaterally.  Her abdomen is soft nontender with positive bowel sounds.  Musculoskeletal does have movement of her extremities x4 but does have significant stiffness of her lower extremities.  Neurologic appears grossly intact her speech is clear but she does not speak a lot she does have what appears to be an intentional tremor.  Psych she is oriented to self she is pleasant does follow simple verbal commands.    Labs reviewed: Recent Labs    07/19/18 07/21/18 12/17/18 0800  NA 138 141 139  K 3.5 3.4 3.4*  CL  --   --  104  CO2  --   --  27  GLUCOSE  --   --  98  BUN 15 15 25*  CREATININE 0.7 0.9 1.14*  CALCIUM  --   --  9.3   Recent Labs    07/21/18 08/05/18 12/17/18 0800  AST 79* 22 17  ALT 82* 7 9  ALKPHOS 652* 228*  77  BILITOT  --   --  0.6  PROT  --   --  7.3  ALBUMIN  --   --  4.4   Recent Labs    07/02/18 07/19/18 12/17/18 0800  WBC 5.2 4.5 8.9  NEUTROABS 3 3 5.7  HGB 12.8 13.8 12.9  HCT 36 40 39.0  MCV  --   --  93.5  PLT 260 185 271   Lab Results  Component Value Date   TSH 2.86 07/02/2018   Lab Results  Component Value Date   HGBA1C 5.4 03/15/2018   Lab Results  Component Value Date   CHOL 139 03/15/2018   HDL 46 03/15/2018   LDLCALC 74 03/15/2018   TRIG 97 03/15/2018    Significant Diagnostic Results in last 30 days:  No results found.  Assessment/Plan  #1 history of dementia with psychosis schizoaffective disorder-she continues on Namenda 10 mg twice daily as well as Aricept 10 mg a day she also has associated anxiety she is now on Ativan 1 mg every 12 hours and this appears to be well-tolerated and effective.  She also continues on Geodon 80 mg twice daily lithium carbonate 300 mg a day and Cymbalta 90 mg a day For schizoaffective disorder   #2 hypertension-Norvasc was recently discontinued because of hypotensive concerns this appears stable recent blood pressures 112/64- 110/61--do see systolics in the 90s but recently have been more in the lower 100s nursing does not report any syncope or overt evidence of significant hypotension  3 history of Parkinson's continues on Sinemet 25-100  three times a day appears relatively stable she does appear to have an intentional tremor- and lower extremity stiffness-- she has been recently restarted on the Sinemet and apparently this is helping with her work with therapy-she also has an order for tramadol before therapy and every 8 hours as needed for pain.  [id number]  Edmon Crape PA-C [phone]

## 2019-04-22 ENCOUNTER — Encounter: Payer: Self-pay | Admitting: Internal Medicine

## 2019-04-28 ENCOUNTER — Non-Acute Institutional Stay (SKILLED_NURSING_FACILITY): Payer: Medicare Other | Admitting: Internal Medicine

## 2019-04-28 ENCOUNTER — Encounter: Payer: Self-pay | Admitting: Internal Medicine

## 2019-04-28 DIAGNOSIS — G8929 Other chronic pain: Secondary | ICD-10-CM

## 2019-04-28 DIAGNOSIS — M545 Low back pain: Secondary | ICD-10-CM

## 2019-04-28 DIAGNOSIS — F0150 Vascular dementia without behavioral disturbance: Secondary | ICD-10-CM | POA: Diagnosis not present

## 2019-04-28 DIAGNOSIS — F251 Schizoaffective disorder, depressive type: Secondary | ICD-10-CM

## 2019-04-28 DIAGNOSIS — M159 Polyosteoarthritis, unspecified: Secondary | ICD-10-CM

## 2019-04-28 DIAGNOSIS — G20 Parkinson's disease: Secondary | ICD-10-CM | POA: Diagnosis not present

## 2019-04-28 DIAGNOSIS — M150 Primary generalized (osteo)arthritis: Secondary | ICD-10-CM | POA: Diagnosis not present

## 2019-04-28 DIAGNOSIS — R634 Abnormal weight loss: Secondary | ICD-10-CM | POA: Insufficient documentation

## 2019-04-28 NOTE — Progress Notes (Signed)
[facility] Room Number: 225-B Place of Service:  SNF [phone]) Provider: Edmon CrapeArlo Eran Windish PA-C  Pecola LawlessHopper, William F, MD  Patient Care Team: Pecola LawlessHopper, William F, MD as PCP - General (Internal Medicine) Center, [facility] ([facility]) Synetta ShadowLassen, Estella Malatesta C, PA-C as Physician Assistant (Internal Medicine)  Extended Emergency Contact Information Primary Emergency Contact: Levy Pupaamsy,[name]  [place] Home Phone: [phone] Relation: Daughter Secondary Emergency Contact: [name] PaymentMiller,Amy  [place] Home Phone: [phone] Relation: Daughter  Code Status:  Full Code Goals of care: Advanced Directive information Advanced Directives 04/28/2019  Does Patient Have a Medical Advance Directive? Yes  Type of Advance Directive (No Data)  Does patient want to make changes to medical advance directive? No - Guardian declined  Would patient like information on creating a medical advance directive? -  Pre-existing out of facility DNR order (yellow form or pink MOST form) -     Chief Complaint  Patient presents with  . Medical Management of Chronic Issues    Routine visit of medical management   Medical management of chronic medical conditions including history of dementia with psychosis-hypertension-Parkinson's disease- GERD-chronic back pain- and insomnia as well as recent weight loss  HPI:  Pt is a 73 y.o. female seen today for medical management of chronic diseases.  As noted above.  Patient is a long-term resident of facility with the above diagnoses.  Most recent issue appears to have been some weight loss is lost about 8% of her body weight apparently over the past month or so-- she has been started on med Pass supplementation.   I also spoke with speech therapist who will be following up on her tomorrow.  Currently she is not having any complaints of her stomach hurting or difficulty swallowing but  she is a poor historian.  In regards to her history of dementia with psychosis with diagnosis of schizoaffective disorder she is on numerous medications including Cymbalta 90 mg a day-Geodon 80 mg twice a day as well as lithium 300 mg a day.  In regards to dementia she is on Namenda 10 mg twice daily she also has Aricept 10 mg a day.  And has an order for Ativan 1 mg every 12 hours this was recently reduced from every 8 hours because of lethargy concerns   She also is on BuSpar 3 times a day.  Currently she is lying in bed comfortably she is alert responsive is pleasant.  She does have a history of Parkinson's disease complicated with chronic back pain which really hindered her therapy at one point her Sinemet was held but has been restarted and apparently this is helping some she also has orders for PRN tramadol and she has Neurontin routinely at night.  Vital signs appear to be stable she does not really complain of pain today but she is lying in bed and is not really exerting herself much.  Apparently this at times has been an issue as well at times when therapy tries to work with her       Past Medical History:  Diagnosis Date  . Allergy   . Anxiety   . Arthritis   . Asthma   . Benign essential hypertension 03/18/2017  . Cataract   . Dementia (HCC)   . Depression   . Difficulty walking   . Dysphagia, oral phase   . Fibromyalgia   . GERD (gastroesophageal reflux disease)   .  Hallucinations   . Hyperlipidemia   . Hypertension   . Lower extremity edema   . Parkinson disease ([place])   . Repeated falls   . Rhabdomyolysis   . Stroke ([place])   . TIA (transient ischemic attack)   . Urinary incontinence    Past Surgical History:  Procedure Laterality Date  . ABDOMINAL HYSTERECTOMY    . CATARACT EXTRACTION    . CHOLECYSTECTOMY    . TONSILLECTOMY AND ADENOIDECTOMY      Allergies  Allergen Reactions  . Hydrocodone Other (See Comments)    GI upset    Allergies as of  04/28/2019      Reactions   Hydrocodone Other (See Comments)   GI upset      Medication List       Accurate as of April 28, 2019  2:50 PM. If you have any questions, ask your nurse or doctor.        albuterol 108 (90 Base) MCG/ACT inhaler Commonly known as: VENTOLIN HFA Inhale 2 puffs into the lungs every 6 (six) hours as needed for wheezing or shortness of breath.   aspirin EC 81 MG tablet Take 81 mg by mouth daily.   Bisacodyl Laxative 10 MG suppository Generic drug: bisacodyl Place 10 mg rectally as needed for moderate constipation.   busPIRone 15 MG tablet Commonly known as: BUSPAR Take 15 mg by mouth 3 (three) times daily.   calcium carbonate 500 MG chewable tablet Commonly known as: TUMS - dosed in mg elemental calcium Chew 1 tablet by mouth every 6 (six) hours as needed for indigestion or heartburn.   carbidopa-levodopa 25-100 MG tablet Commonly known as: SINEMET IR Take 1 tablet by mouth 3 (three) times daily.   cholecalciferol 1000 units tablet Commonly known as: VITAMIN D Take 1,000 Units by mouth daily.   Claritin Reditabs 5 MG Tbdp Generic drug: Loratadine TAKE 2 TABLETS (10MG ) BY MOUTH EVERY DAY AS NEEDED FOR ALLERGIES   donepezil 10 MG tablet Commonly known as: ARICEPT Take 10 mg by mouth at bedtime.   DULoxetine 30 MG capsule Commonly known as: CYMBALTA Give 3 tablets (90mg ) by mouth daily   fluticasone 50 MCG/ACT nasal spray Commonly known as: FLONASE Place 2 sprays into both nostrils daily.   gabapentin 100 MG capsule Commonly known as: NEURONTIN Take 100 mg by mouth at bedtime.   lidocaine 5 % Commonly known as: LIDODERM Place 2 patches onto the skin daily. Apply two patches tyo lower back daily for pain   lithium carbonate 300 MG capsule Take 300 mg by mouth at bedtime.   loperamide 2 MG capsule Commonly known as: IMODIUM Take 2 mg by mouth every 6 (six) hours as needed for diarrhea or loose stools.   LORazepam 2 MG tablet  Commonly known as: ATIVAN Take 0.5 tablets (1 mg total) by mouth every 12 (twelve) hours.   Melatonin 3 MG Tabs Take 1 tablet by mouth at bedtime.   memantine 10 MG tablet Commonly known as: NAMENDA Take 10 mg by mouth 2 (two) times daily.   NON FORMULARY 171ml Med Pass BID   ondansetron 4 MG tablet Commonly known as: ZOFRAN Take 4 mg by mouth every 6 (six) hours as needed for nausea or vomiting.   pantoprazole 40 MG tablet Commonly known as: PROTONIX Take 40 mg by mouth daily.   polyethylene glycol 17 g packet Commonly known as: MIRALAX / GLYCOLAX Take 17 g by mouth daily as needed.   traMADol 50 MG tablet Commonly  known as: ULTRAM Take 50 mg by mouth See admin instructions. Give one hour prior to PT, hold for sedation or respiratory depression   traMADol 50 MG tablet Commonly known as: ULTRAM Take 1 tablet (50 mg total) by mouth every 8 (eight) hours as needed.   ziprasidone 80 MG capsule Commonly known as: GEODON Give one by mouth twice a day for Psychosis       Review of Systems   This is quite limited secondary to dementia but when I asked if she is hurting at this point she denied-.    Immunization History  Administered Date(s) Administered  . Influenza-Unspecified 03/17/2017, 09/09/2017, 08/17/2018  . PPD Test 03/24/2017, 06/02/2018, 06/08/2018  . Pneumococcal Polysaccharide-23 03/17/2017, 06/12/2018   Pertinent  Health Maintenance Due  Topic Date Due  . INFLUENZA VACCINE  06/18/2019  . DEXA SCAN  Discontinued  . COLONOSCOPY  Discontinued  . PNA vac Low Risk Adult  Discontinued   Fall Risk  05/27/2018 05/14/2017  Falls in the past year? No Yes  Number falls in past yr: - 2 or more  Injury with Fall? - No   Functional Status Survey:    Vitals:   04/28/19 1438  BP: 112/64  Pulse: 80  Resp: 20  Temp: 97.7 F (36.5 C)  TempSrc: Oral  Weight: 144 lb 12.8 oz (65.7 kg)  Height: 5\' 5"  (1.651 m)   Body mass index is 24.1 kg/m. Physical Exam    In general this is a pleasant elderly female no distress she is resting comfortably in bed she is bright and alert.  Her skin is warm and dry.  Eyes visual acuity appears grossly intact sclera and conjunctive are clear.  Oropharynx is clear mucous membranes moist.  Chest is clear to auscultation there is no labored breathing.  Heart is regular rate and rhythm without murmur gallop or rub.  Her abdomen is soft nontender with active bowel sounds.  Musculoskeletal does move her upper extremities it appears at baseline with a hint of a mild intentional tremor on the right.  She has some stiffness of her lower extremities bilaterally.  Exam is somewhat limited because she is lying in bed.  Neurologic as noted above again she appears to have a slight intentional tremor of the right upper extremity she is alert her speech is clear.  Psych she is oriented to self she does follow simple verbal commands she is pleasant and cooperative  Labs reviewed: Recent Labs    07/19/18 07/21/18 12/17/18 0800  NA 138 141 139  K 3.5 3.4 3.4*  CL  --   --  104  CO2  --   --  27  GLUCOSE  --   --  98  BUN 15 15 25*  CREATININE 0.7 0.9 1.14*  CALCIUM  --   --  9.3   Recent Labs    07/21/18 08/05/18 12/17/18 0800  AST 79* 22 17  ALT 82* 7 9  ALKPHOS 652* 228* 77  BILITOT  --   --  0.6  PROT  --   --  7.3  ALBUMIN  --   --  4.4   Recent Labs    07/02/18 07/19/18 12/17/18 0800  WBC 5.2 4.5 8.9  NEUTROABS 3 3 5.7  HGB 12.8 13.8 12.9  HCT 36 40 39.0  MCV  --   --  93.5  PLT 260 185 271   Lab Results  Component Value Date   TSH 2.86 07/02/2018   Lab  Results  Component Value Date   HGBA1C 5.4 03/15/2018   Lab Results  Component Value Date   CHOL 139 03/15/2018   HDL 46 03/15/2018   LDLCALC 74 03/15/2018   TRIG 97 03/15/2018    Significant Diagnostic Results in last 30 days:  No results found.  Assessment/Plan  #1 history of dementia with psychosis with history of  schizoaffective disorder.  She does have a consult with psychiatric nurse practitioner pending this is been complicated with coronavirus quarantine.  She does continue on numerous medications including Cymbalta 90 mg a day- Geodon 80 mg twice a day as well as lithium 300 mg a day.  She also has associated dementia with orders for Namenda 10 mg twice daily and Aricept 10 mg once a day.  In regards to anxiety which has been a significant issue but at this point appears relatively stable she is on Ativan 1 mg every 12 hours this was recently reduced from every 8 hours.  She also has orders for BuSpar 15 mg 3 times daily.  At this point continue supportive care.  2.  History of Parkinson's disease she has been restarted on Sinemet 25-100 she is on this 3 times a day there are hopes this will help with her stiffness and working with therapy and apparently there have been some improvements.  3.  History of chronic back pain she does have orders for tramadol 50 mg every 8 hours as needed she is also on Neurontin 100 mg routinely at night.  At this point she is denying pain she is a somewhat poor historian but she does get the tramadol it appears routinely before therapy.  4.  History of hypertension Norvasc was recently discontinued secondary to some lower blood pressures recent blood pressures 112/64-110/61 102/62- 113/65 at this point will monitor and apparently her been no complaints of syncope or lightheadedness.  5.  History of GERD she continues on Protonix 40 mg a day.  6.  History of weight loss-this was discussed with Dr. Alwyn RenHopper via phone and will update labs including a metabolic panel and TSH and a CBC.  She has been started on med Pass supplementation at this point continue to monitor.  7.  History of insomnia she continues on melatonin nightly.  8.  History of allergic rhinitis she continues on Flonase as well as Claritin this appears to be stable.  She also has albuterol  nebulizers as needed.   [id number]

## 2019-04-29 DIAGNOSIS — G20 Parkinson's disease: Secondary | ICD-10-CM | POA: Diagnosis not present

## 2019-04-29 DIAGNOSIS — R1311 Dysphagia, oral phase: Secondary | ICD-10-CM | POA: Diagnosis not present

## 2019-04-30 ENCOUNTER — Encounter: Payer: Self-pay | Admitting: Internal Medicine

## 2019-04-30 LAB — CBC AND DIFFERENTIAL
HCT: 39 (ref 36–46)
Hemoglobin: 13.4 (ref 12.0–16.0)
Neutrophils Absolute: 4
Platelets: 261 (ref 150–399)
WBC: 6.2

## 2019-04-30 LAB — BASIC METABOLIC PANEL
BUN: 16 (ref 4–21)
Creatinine: 0.7 (ref 0.5–1.1)
Glucose: 96
Potassium: 3.7 (ref 3.4–5.3)
Sodium: 144 (ref 137–147)

## 2019-04-30 LAB — HEPATIC FUNCTION PANEL
ALT: 6 — AB (ref 7–35)
AST: 12 — AB (ref 13–35)
Alkaline Phosphatase: 88 (ref 25–125)
Bilirubin, Total: 0.3

## 2019-04-30 LAB — TSH: TSH: 4.7 (ref 0.41–5.90)

## 2019-05-02 DIAGNOSIS — G20 Parkinson's disease: Secondary | ICD-10-CM | POA: Diagnosis not present

## 2019-05-02 DIAGNOSIS — R1311 Dysphagia, oral phase: Secondary | ICD-10-CM | POA: Diagnosis not present

## 2019-05-03 ENCOUNTER — Other Ambulatory Visit: Payer: Self-pay | Admitting: Adult Health

## 2019-05-03 DIAGNOSIS — G20 Parkinson's disease: Secondary | ICD-10-CM | POA: Diagnosis not present

## 2019-05-03 DIAGNOSIS — R1311 Dysphagia, oral phase: Secondary | ICD-10-CM | POA: Diagnosis not present

## 2019-05-03 MED ORDER — LORAZEPAM 2 MG PO TABS: 1.0000 mg | ORAL_TABLET | Freq: Two times a day (BID) | ORAL | 0 refills | Status: DC

## 2019-05-03 NOTE — Telephone Encounter (Signed)
Refill request completed, pended, and forwarded to NP for approval and transmittal to [facility].

## 2019-05-04 DIAGNOSIS — G20 Parkinson's disease: Secondary | ICD-10-CM | POA: Diagnosis not present

## 2019-05-04 DIAGNOSIS — R1311 Dysphagia, oral phase: Secondary | ICD-10-CM | POA: Diagnosis not present

## 2019-05-05 ENCOUNTER — Other Ambulatory Visit: Payer: Self-pay | Admitting: Internal Medicine

## 2019-05-05 DIAGNOSIS — G20 Parkinson's disease: Secondary | ICD-10-CM | POA: Diagnosis not present

## 2019-05-05 DIAGNOSIS — F419 Anxiety disorder, unspecified: Secondary | ICD-10-CM | POA: Diagnosis not present

## 2019-05-05 DIAGNOSIS — F0280 Dementia in other diseases classified elsewhere without behavioral disturbance: Secondary | ICD-10-CM | POA: Diagnosis not present

## 2019-05-05 DIAGNOSIS — G3183 Dementia with Lewy bodies: Secondary | ICD-10-CM | POA: Diagnosis not present

## 2019-05-05 DIAGNOSIS — F329 Major depressive disorder, single episode, unspecified: Secondary | ICD-10-CM | POA: Diagnosis not present

## 2019-05-05 DIAGNOSIS — R1311 Dysphagia, oral phase: Secondary | ICD-10-CM | POA: Diagnosis not present

## 2019-05-05 MED ORDER — LORAZEPAM 2 MG PO TABS: 1.0000 mg | ORAL_TABLET | Freq: Two times a day (BID) | ORAL | 0 refills | Status: DC

## 2019-05-06 DIAGNOSIS — G20 Parkinson's disease: Secondary | ICD-10-CM | POA: Diagnosis not present

## 2019-05-06 DIAGNOSIS — R1311 Dysphagia, oral phase: Secondary | ICD-10-CM | POA: Diagnosis not present

## 2019-05-09 DIAGNOSIS — R1311 Dysphagia, oral phase: Secondary | ICD-10-CM | POA: Diagnosis not present

## 2019-05-09 DIAGNOSIS — G20 Parkinson's disease: Secondary | ICD-10-CM | POA: Diagnosis not present

## 2019-05-10 DIAGNOSIS — R1311 Dysphagia, oral phase: Secondary | ICD-10-CM | POA: Diagnosis not present

## 2019-05-10 DIAGNOSIS — G20 Parkinson's disease: Secondary | ICD-10-CM | POA: Diagnosis not present

## 2019-05-11 DIAGNOSIS — G20 Parkinson's disease: Secondary | ICD-10-CM | POA: Diagnosis not present

## 2019-05-11 DIAGNOSIS — R1311 Dysphagia, oral phase: Secondary | ICD-10-CM | POA: Diagnosis not present

## 2019-05-12 DIAGNOSIS — G20 Parkinson's disease: Secondary | ICD-10-CM | POA: Diagnosis not present

## 2019-05-12 DIAGNOSIS — R1311 Dysphagia, oral phase: Secondary | ICD-10-CM | POA: Diagnosis not present

## 2019-05-13 DIAGNOSIS — G20 Parkinson's disease: Secondary | ICD-10-CM | POA: Diagnosis not present

## 2019-05-13 DIAGNOSIS — R1311 Dysphagia, oral phase: Secondary | ICD-10-CM | POA: Diagnosis not present

## 2019-05-16 DIAGNOSIS — R1311 Dysphagia, oral phase: Secondary | ICD-10-CM | POA: Diagnosis not present

## 2019-05-16 DIAGNOSIS — G20 Parkinson's disease: Secondary | ICD-10-CM | POA: Diagnosis not present

## 2019-05-17 DIAGNOSIS — R1311 Dysphagia, oral phase: Secondary | ICD-10-CM | POA: Diagnosis not present

## 2019-05-17 DIAGNOSIS — G20 Parkinson's disease: Secondary | ICD-10-CM | POA: Diagnosis not present

## 2019-05-18 DIAGNOSIS — M6281 Muscle weakness (generalized): Secondary | ICD-10-CM | POA: Diagnosis not present

## 2019-05-18 DIAGNOSIS — R1311 Dysphagia, oral phase: Secondary | ICD-10-CM | POA: Diagnosis not present

## 2019-05-18 DIAGNOSIS — G20 Parkinson's disease: Secondary | ICD-10-CM | POA: Diagnosis not present

## 2019-05-19 ENCOUNTER — Other Ambulatory Visit: Payer: Self-pay

## 2019-05-19 DIAGNOSIS — G20 Parkinson's disease: Secondary | ICD-10-CM | POA: Diagnosis not present

## 2019-05-19 DIAGNOSIS — R1311 Dysphagia, oral phase: Secondary | ICD-10-CM | POA: Diagnosis not present

## 2019-05-19 DIAGNOSIS — M6281 Muscle weakness (generalized): Secondary | ICD-10-CM | POA: Diagnosis not present

## 2019-05-19 MED ORDER — TRAMADOL HCL 50 MG PO TABS: 50.0000 mg | ORAL_TABLET | Freq: Three times a day (TID) | ORAL | 0 refills | Status: DC | PRN

## 2019-05-19 MED ORDER — TRAMADOL HCL 50 MG PO TABS: 50.0000 mg | ORAL_TABLET | ORAL | 0 refills | Status: DC

## 2019-05-19 NOTE — Telephone Encounter (Signed)
Refill request completed, pended, and sent to provider for approval, with transmittal to [facility] upon approval.

## 2019-05-20 DIAGNOSIS — M6281 Muscle weakness (generalized): Secondary | ICD-10-CM | POA: Diagnosis not present

## 2019-05-20 DIAGNOSIS — G20 Parkinson's disease: Secondary | ICD-10-CM | POA: Diagnosis not present

## 2019-05-20 DIAGNOSIS — R1311 Dysphagia, oral phase: Secondary | ICD-10-CM | POA: Diagnosis not present

## 2019-05-23 ENCOUNTER — Non-Acute Institutional Stay (SKILLED_NURSING_FACILITY): Payer: Medicare Other | Admitting: Internal Medicine

## 2019-05-23 ENCOUNTER — Encounter: Payer: Self-pay | Admitting: Internal Medicine

## 2019-05-23 ENCOUNTER — Other Ambulatory Visit: Payer: Self-pay | Admitting: Internal Medicine

## 2019-05-23 DIAGNOSIS — R1311 Dysphagia, oral phase: Secondary | ICD-10-CM | POA: Diagnosis not present

## 2019-05-23 DIAGNOSIS — F251 Schizoaffective disorder, depressive type: Secondary | ICD-10-CM

## 2019-05-23 DIAGNOSIS — M6281 Muscle weakness (generalized): Secondary | ICD-10-CM | POA: Diagnosis not present

## 2019-05-23 DIAGNOSIS — R634 Abnormal weight loss: Secondary | ICD-10-CM

## 2019-05-23 DIAGNOSIS — K219 Gastro-esophageal reflux disease without esophagitis: Secondary | ICD-10-CM | POA: Diagnosis not present

## 2019-05-23 DIAGNOSIS — G20 Parkinson's disease: Secondary | ICD-10-CM | POA: Diagnosis not present

## 2019-05-23 DIAGNOSIS — F0150 Vascular dementia without behavioral disturbance: Secondary | ICD-10-CM | POA: Diagnosis not present

## 2019-05-23 DIAGNOSIS — G8929 Other chronic pain: Secondary | ICD-10-CM

## 2019-05-23 DIAGNOSIS — M545 Low back pain, unspecified: Secondary | ICD-10-CM

## 2019-05-23 MED ORDER — LORAZEPAM 2 MG PO TABS: 1.0000 mg | ORAL_TABLET | Freq: Two times a day (BID) | ORAL | 0 refills | Status: DC

## 2019-05-23 NOTE — Progress Notes (Signed)
Location:  [facility] Room Number: 225-B Place of Service:  SNF [phone](31) Provider:  Edmon CrapeArlo Sedona Wenk, PA-C  Pecola LawlessHopper, William F, MD  Patient Care Team: Pecola LawlessHopper, William F, MD as PCP - General (Internal Medicine) Center, [facility]) Synetta ShadowLassen, Zigmond Trela C, PA-C as Physician Assistant (Internal Medicine)  Extended Emergency Contact Information Primary Emergency Contact: Levy Pupaamsy,[name]  [place] Home Phone: [phone] Relation: Daughter Secondary Emergency Contact: [name] PaymentMiller,Amy  [place] Home Phone: [phone] Relation: Daughter  Code Status:  Full Code Goals of care: Advanced Directive information Advanced Directives 04/28/2019  Does Patient Have a Medical Advance Directive? Yes  Type of Advance Directive (No Data)  Does patient want to make changes to medical advance directive? No - Guardian declined  Would patient like information on creating a medical advance directive? -  Pre-existing out of facility DNR order (yellow form or pink MOST form) -     Chief Complaint  Patient presents with   Medical Management of Chronic Issues    Routine [facility] visit   Management of chronic medical conditions including Parkinson's disease-chronic back pain- hypertension-GERD- weight loss-allergic rhinitis.  As well as dementia with psychosis   HPI:  Pt is a 73 y.o. female seen today for medical management of chronic diseases as noted above. Most recent issue has been some weight loss when I saw her last month apparently she had lost about 8% of her body weight over the past previous weeks- she was evaluated by dietary and was started on med Pass supplementation which she continues on  Appears she has actually gained about 3 pounds back over the past month but this will need to be monitored.  We did do labs at that point which appear to be fairly unremarkable her albumin actually was 4.0.  She does have a history  of chronic back pain and does have PRN tramadol as well as routinely apparently before therapy she still continues with some pain at times apparently physical therapy is changing the chair she stays in with hopes this will help with that and help with some of her therapy progress as well.  She also continues on Neurontin at night and has a lidocaine patch in addition to tramadol every 8 hours as needed and routinely before therapy I did encourage her to ask for this if she needs it  In regards to dementia with psychosis she continues on Cymbalta 90 mg a day Geodon 80 mg twice a day and lithium 300 mg a day-.  She also continues on routine Namenda 10 mg twice daily and Aricept 10 mg a day.  She also has orders for BuSpar 3 times a day  She has Ativan scheduled every 12 hours this was reduced from every 8 hours because of lethargy   Regards to Parkinson she does continue on Sinemet 25-100 3 times daily.  Currently she has no acute complaints but when when I spoke with her a bit more she did complain of some long-term dental discomfort and we will order a dental consult-again this has been somewhat complicated coronavirus quarantine.  She also did complain of some continued back pain leg pain and encouraged her to let nursing know when she is having discomfort because she does have medications PRN  Past Medical History:  Diagnosis Date   Allergy    Anxiety    Arthritis    Asthma    Benign essential hypertension 03/18/2017   Cataract    Dementia (  HCC)    Depression    Difficulty walking    Dysphagia, oral phase    Fibromyalgia    GERD (gastroesophageal reflux disease)    Hallucinations    Hyperlipidemia    Hypertension    Lower extremity edema    Parkinson disease (HCC)    Repeated falls    Rhabdomyolysis    Stroke (HCC)    TIA (transient ischemic attack)    Urinary incontinence    Past Surgical History:  Procedure Laterality Date   ABDOMINAL HYSTERECTOMY      CATARACT EXTRACTION     CHOLECYSTECTOMY     TONSILLECTOMY AND ADENOIDECTOMY      Allergies  Allergen Reactions   Hydrocodone Other (See Comments)    GI upset    Outpatient Encounter Medications as of 05/23/2019  Medication Sig   albuterol (PROVENTIL HFA;VENTOLIN HFA) 108 (90 Base) MCG/ACT inhaler Inhale 2 puffs into the lungs every 6 (six) hours as needed for wheezing or shortness of breath.   aspirin EC 81 MG tablet Take 81 mg by mouth daily.   bisacodyl (BISACODYL LAXATIVE) 10 MG suppository Place 10 mg rectally as needed for moderate constipation.   busPIRone (BUSPAR) 15 MG tablet Take 15 mg by mouth 3 (three) times daily.   calcium carbonate (TUMS - DOSED IN MG ELEMENTAL CALCIUM) 500 MG chewable tablet Chew 1 tablet by mouth every 6 (six) hours as needed for indigestion or heartburn.   carbidopa-levodopa (SINEMET IR) 25-100 MG tablet Take 1 tablet by mouth 3 (three) times daily.   cholecalciferol (VITAMIN D) 1000 units tablet Take 1,000 Units by mouth daily.    donepezil (ARICEPT) 10 MG tablet Take 10 mg by mouth at bedtime.    DULoxetine (CYMBALTA) 30 MG capsule Give 3 tablets (90mg ) by mouth daily   fluticasone (FLONASE) 50 MCG/ACT nasal spray Place 2 sprays into both nostrils daily.    gabapentin (NEURONTIN) 100 MG capsule Take 100 mg by mouth at bedtime.    lidocaine (LIDODERM) 5 % Place 2 patches onto the skin daily. Apply two patches tyo lower back daily for pain   lithium carbonate 300 MG capsule Take 300 mg by mouth at bedtime.   loperamide (IMODIUM) 2 MG capsule Take 2 mg by mouth every 6 (six) hours as needed for diarrhea or loose stools.   Loratadine (CLARITIN REDITABS) 5 MG TBDP TAKE 2 TABLETS (10MG ) BY MOUTH EVERY DAY AS NEEDED FOR ALLERGIES   LORazepam (ATIVAN) 2 MG tablet Take 0.5 tablets (1 mg total) by mouth every 12 (twelve) hours.   Melatonin 3 MG TABS Take 1 tablet by mouth at bedtime.    memantine (NAMENDA) 10 MG tablet Take 10 mg by  mouth 2 (two) times daily.    Nutritional Supplement LIQD Take 120 mLs by mouth 2 (two) times a day. MedPass   ondansetron (ZOFRAN) 4 MG tablet Take 4 mg by mouth every 6 (six) hours as needed for nausea or vomiting.   pantoprazole (PROTONIX) 40 MG tablet Take 40 mg by mouth daily.    polyethylene glycol (MIRALAX / GLYCOLAX) packet Take 17 g by mouth daily as needed.    traMADol (ULTRAM) 50 MG tablet Take 1 tablet (50 mg total) by mouth See admin instructions. Give one hour prior to PT, hold for sedation or respiratory depression   traMADol (ULTRAM) 50 MG tablet Take 1 tablet (50 mg total) by mouth every 8 (eight) hours as needed.   ziprasidone (GEODON) 80 MG capsule Give one by mouth  twice a day for Psychosis   [DISCONTINUED] NON FORMULARY Med Pass BID   No facility-administered encounter medications on file as of 05/23/2019.     Review of Systems   This is limited secondary to patient being somewhat poor historian with a history of dementia.  General she is not complaining of fever chills she appears to have gained a small amount of weight.  Skin is not complaining of rashes or itching.  Head ears eyes nose mouth and throat is not complain of visual changes or sore throat.  She has complain of some chronic tooth discomfort again she describes this is more chronic and not acute  Tori does not complain of shortness of breath or cough.  Cardiac does not complain of chest pain GI does not complain of abdominal disc diarrhea or const patient.  GU no complaints of dysuria.  Musculoskeletal does have a history of somewhat chronic back and leg pain with stiffness as noted above.  Neurologic does have a history of Parkinson's with an upper extremity tremor is not complaining of dizziness or headache.  And psych does have a history of dementia with psychosis apparently this is been somewhat stable recently with no acute behaviors noted recently  Immunization History    Administered Date(s) Administered   Influenza-Unspecified 03/17/2017, 09/09/2017, 08/17/2018   PPD Test 03/24/2017, 06/02/2018, 06/08/2018   Pneumococcal Polysaccharide-23 03/17/2017, 06/12/2018   Pertinent  Health Maintenance Due  Topic Date Due   URINE MICROALBUMIN  12/21/1955   DEXA SCAN  12/21/2008   PNA vac Low Risk Adult (2 of 2 - PCV13) 06/13/2019   INFLUENZA VACCINE  06/18/2019   COLONOSCOPY  Discontinued   Fall Risk  05/27/2018 05/14/2017  Falls in the past year? No Yes  Number falls in past yr: - 2 or more  Injury with Fall? - No   Functional Status Survey:    Vitals:   05/23/19 1327  BP: 115/60  Pulse: 75  Resp: 18  Temp: (!) 97.3 F (36.3 C)  TempSrc: Oral  Weight: 147 lb (66.7 kg)  Height:  (1.651 m)   Body mass index is 24.46 kg/m. Physical Exam   General this is a pleasant elderly female in no distress.  Skin is warm and dry.  Eyes visual acuity appears grossly intact sclera and conjunctive oropharynx is clear mucous membranes moist-she does have some dental extractions left upper mouth-I do not really see any sign of bleeding discharge blisters  There does not really appear to be significant tenderness  On  palpation of the gums-I cannot appreciate adenopathy  Chest is clear to auscultation there is no labored breathing.  Heart is regular rate and rhythm without murmur gallop or rub she has trace lower extremity edema.  Abdomen is soft nontender with positive bowel sounds.  Musculoskeletal does have some stiffness most prominently of her lower extremities bilaterally Does appear to have some mild tremors of her upper extremities bilaterally.  Neurologic as noted above she does have some mild upper extremity tremors and some stiffness most prominently of her lower extremities speech is clear she does not speak a whole lot cranial nerves appear grossly intact.  35 she continues with blank facies  Psych she is oriented to self she is  pleasant appropriate does follow simple verbal commands without difficulty  Labs reviewed:  April 30, 2019.  Sodium 144 potassium 3.7 BUN 15.7 creatinine 0.7.  Liver function tests within normal limits.  Albumin was 4.0.  WBC 6.2 hemoglobin  13.4 platelets 261.  TSH was 4.70 Recent Labs    07/19/18 07/21/18 12/17/18 0800  NA 138 141 139  K 3.5 3.4 3.4*  CL  --   --  104  CO2  --   --  27  GLUCOSE  --   --  98  BUN 15 15 25*  CREATININE 0.7 0.9 1.14*  CALCIUM  --   --  9.3   Recent Labs    07/21/18 08/05/18 12/17/18 0800  AST 79* 22 17  ALT 82* 7 9  ALKPHOS 652* 228* 77  BILITOT  --   --  0.6  PROT  --   --  7.3  ALBUMIN  --   --  4.4   Recent Labs    07/02/18 07/19/18 12/17/18 0800 04/30/19  WBC 5.2 4.5 8.9 6.2  NEUTROABS 3 3 5.7  --   HGB 12.8 13.8 12.9 13.4  HCT 36 40 39.0 39  MCV  --   --  93.5  --   PLT 260 185 271 261   Lab Results  Component Value Date   TSH 2.86 07/02/2018   Lab Results  Component Value Date   HGBA1C 5.4 03/15/2018   Lab Results  Component Value Date   CHOL 139 03/15/2018   HDL 46 03/15/2018   LDLCALC 74 03/15/2018   TRIG 97 03/15/2018    Significant Diagnostic Results in last 30 days:  No results found.  Assessment/Plan  #1- history of weight loss-she appears to have gained a small amount of weight which is encouraging she is on med Pass twice daily and has been followed by dietary at this point continue to monitor- labs done last month were reassuring albumin was 4.0 renal function appeared to be stable.  2.  Dementia with psychosis-she appears to be doing fairly well with supportive care she is on numerous medications as noted above including Namenda 10 mg twice daily and Aricept 10 mg a day.  She also continues on Cymbalta 90 mg a day-Geodon 80 mg twice daily and lithium 300 mg daily.  Also has orders for Ativan 1 mg 12 hours reduced from every 8 hours secondary to sedation concerns  She also has orders for BuSpar  15 mg 3 times daily  Not noted any increased behaviors.  3.-  History of Parkinson's disease at one point her Sinemet was discontinued but she had increased stiffness and apparently discomfort this has been restarted she is on Sinemet 25-100 three times a day.  4.  History of chronic back pain- she continues on tramadol milligrams every 8 hours PRN and apparently routine before therapy- unsure if she asked for the tramadol much but I have encouraged her to ask for it.  She is also on Neurontin 100 mg nightly and has a lidocaine patch to back.  At this point will monitor  Apparently physical therapy is working on getting her a different chair and hopes this will help with her discomfort and possibly help with progression with therapy.  5.-  Hypertension this appears to be stable recent blood pressures 115/60-114/68-113/69-128/62-130/72.  She is no longer on any medication her Norvasc was DC'd previously.  6.  History of GERD she continues on Protonix.   .  7.  History of allergic rhinitis she continues on Flonase as well as Claritin this appears to be stabilized she also has albuterol nebulizers if needed.   8- history of chronic dental discomfort will order a dental consult and monitoring for  any changes not really complain of the acute change but describes this as more chronic and long-term   9-insomnia continues on melatonin at night apparently this is effective-with all her other medications would try to avoid at this point anything stronger for concerns of side effects.   [id number]

## 2019-05-24 DIAGNOSIS — R1311 Dysphagia, oral phase: Secondary | ICD-10-CM | POA: Diagnosis not present

## 2019-05-24 DIAGNOSIS — G20 Parkinson's disease: Secondary | ICD-10-CM | POA: Diagnosis not present

## 2019-05-24 DIAGNOSIS — M6281 Muscle weakness (generalized): Secondary | ICD-10-CM | POA: Diagnosis not present

## 2019-05-25 DIAGNOSIS — R1311 Dysphagia, oral phase: Secondary | ICD-10-CM | POA: Diagnosis not present

## 2019-05-25 DIAGNOSIS — G20 Parkinson's disease: Secondary | ICD-10-CM | POA: Diagnosis not present

## 2019-05-25 DIAGNOSIS — M6281 Muscle weakness (generalized): Secondary | ICD-10-CM | POA: Diagnosis not present

## 2019-05-26 DIAGNOSIS — G20 Parkinson's disease: Secondary | ICD-10-CM | POA: Diagnosis not present

## 2019-05-26 DIAGNOSIS — M6281 Muscle weakness (generalized): Secondary | ICD-10-CM | POA: Diagnosis not present

## 2019-05-26 DIAGNOSIS — R1311 Dysphagia, oral phase: Secondary | ICD-10-CM | POA: Diagnosis not present

## 2019-05-27 DIAGNOSIS — M6281 Muscle weakness (generalized): Secondary | ICD-10-CM | POA: Diagnosis not present

## 2019-05-27 DIAGNOSIS — G20 Parkinson's disease: Secondary | ICD-10-CM | POA: Diagnosis not present

## 2019-05-27 DIAGNOSIS — R1311 Dysphagia, oral phase: Secondary | ICD-10-CM | POA: Diagnosis not present

## 2019-05-30 DIAGNOSIS — G20 Parkinson's disease: Secondary | ICD-10-CM | POA: Diagnosis not present

## 2019-05-30 DIAGNOSIS — R1311 Dysphagia, oral phase: Secondary | ICD-10-CM | POA: Diagnosis not present

## 2019-05-30 DIAGNOSIS — M6281 Muscle weakness (generalized): Secondary | ICD-10-CM | POA: Diagnosis not present

## 2019-05-31 DIAGNOSIS — M6281 Muscle weakness (generalized): Secondary | ICD-10-CM | POA: Diagnosis not present

## 2019-05-31 DIAGNOSIS — R1311 Dysphagia, oral phase: Secondary | ICD-10-CM | POA: Diagnosis not present

## 2019-05-31 DIAGNOSIS — G20 Parkinson's disease: Secondary | ICD-10-CM | POA: Diagnosis not present

## 2019-06-01 ENCOUNTER — Non-Acute Institutional Stay (SKILLED_NURSING_FACILITY): Payer: Medicare Other | Admitting: Adult Health

## 2019-06-01 ENCOUNTER — Encounter: Payer: Self-pay | Admitting: Adult Health

## 2019-06-01 DIAGNOSIS — R52 Pain, unspecified: Secondary | ICD-10-CM

## 2019-06-01 DIAGNOSIS — G4700 Insomnia, unspecified: Secondary | ICD-10-CM

## 2019-06-01 DIAGNOSIS — G20 Parkinson's disease: Secondary | ICD-10-CM | POA: Diagnosis not present

## 2019-06-01 DIAGNOSIS — R1311 Dysphagia, oral phase: Secondary | ICD-10-CM | POA: Diagnosis not present

## 2019-06-01 DIAGNOSIS — M6281 Muscle weakness (generalized): Secondary | ICD-10-CM | POA: Diagnosis not present

## 2019-06-01 NOTE — Progress Notes (Signed)
Location:  [facility] [facility] Room Number: 225/B Place of Service:  SNF (31) Provider:  Durenda Age, DNP, FNP-BC  Patient Care Team: Hendricks Limes, MD as PCP - General (Internal Medicine) Center, [place] [facility] Rolm Baptise as Physician Assistant (Internal Medicine)  Extended Emergency Contact Information Primary Emergency Contact: Vivia [place] Phone: [phone] Relation: Daughter Secondary Emergency Contact: Merleen Mill[place] Phone: [phone] Relation: Daughter  Code Status:  Full Code  Goals of care: Advanced Directive information Advanced Directives 06/01/2019  Does Patient Have a Medical Advance Directive? Yes  Type of Advance Directive (No Data)  Does patient want to make changes to medical advance directive? No - Patient declined  Would patient like information on creating a medical advance directive? -  Pre-existing out of facility DNR order (yellow form or pink MOST form) -     Chief Complaint  Patient presents with  . Acute Visit    Medication Management    HPI:  Pt is a 73 y.o. female seen today for medication management. Pharmacy consultant recommended a medication review. She was seen in her room today and noted to be sleeping but easily arousable to verbal greetings. She talked about having four children. She has multiple psychotropics such as Ativan, BuSpar, Cymbalta, Geodon and lithium.  She is currently on tramadol 50 mg every morning and every 6 hours as needed for pain.  He denies having pain at this time.  She is no longer on Robaxin nor Flexeril for muscle spasm. She is currently on Melatonin at bedtime.  BPs 110/68, 109/65, 128/72, 142/76, 102/62. According to pharmacy notes, it is recommended that an ECG in the morning for 20 tramadol is combined with the above psychotropics.   Past Medical History:  Diagnosis Date  . Allergy    . Anxiety   . Arthritis   . Asthma   . Benign essential hypertension 03/18/2017  . Cataract   . Dementia ([place])   . Depression   . Difficulty walking   . Dysphagia, oral phase   . Fibromyalgia   . GERD (gastroesophageal reflux disease)   . Hallucinations   . Hyperlipidemia   . Hypertension   . Lower extremity edema   . Parkinson disease ([place])   . Repeated falls   . Rhabdomyolysis   . Stroke ([place])   . TIA (transient ischemic attack)   . Urinary incontinence    Past Surgical History:  Procedure Laterality Date  . ABDOMINAL HYSTERECTOMY    . CATARACT EXTRACTION    . CHOLECYSTECTOMY    . TONSILLECTOMY AND ADENOIDECTOMY      Allergies  Allergen Reactions  . Hydrocodone Other (See Comments)    GI upset    Outpatient Encounter Medications as of 06/01/2019  Medication Sig  . albuterol (PROVENTIL HFA;VENTOLIN HFA) 108 (90 Base) MCG/ACT inhaler Inhale 2 puffs into the lungs every 6 (six) hours as needed for wheezing or shortness of breath.  Marland Kitchen aspirin EC 81 MG tablet Take 81 mg by mouth daily.  . bisacodyl (BISACODYL LAXATIVE) 10 MG suppository Place 10 mg rectally as needed for moderate constipation.  . busPIRone (BUSPAR) 15 MG tablet Take 15 mg by mouth 3 (three) times daily.  . calcium carbonate (TUMS - DOSED IN MG ELEMENTAL CALCIUM) 500 MG chewable tablet Chew 1 tablet by mouth every 6 (six) hours as needed for indigestion or heartburn.  . carbidopa-levodopa (SINEMET IR) 25-100 MG tablet  Take 1 tablet by mouth 3 (three) times daily.  . cholecalciferol (VITAMIN D) 1000 units tablet Take 1,000 Units by mouth daily.   Marland Kitchen. donepezil (ARICEPT) 10 MG tablet Take 10 mg by mouth at bedtime.   . DULoxetine (CYMBALTA) 30 MG capsule Give 3 tablets (90mg ) by mouth daily  . fluticasone (FLONASE) 50 MCG/ACT nasal spray Place 2 sprays into both nostrils daily.   Marland Kitchen. gabapentin (NEURONTIN) 100 MG capsule Take 100 mg by mouth at bedtime.   . lidocaine (LIDODERM) 5 % Place 2 patches onto the skin  daily. Apply two patches tyo lower back daily for pain  . lithium carbonate 300 MG capsule Take 300 mg by mouth at bedtime.  Marland Kitchen. loperamide (IMODIUM) 2 MG capsule Take 2 mg by mouth every 6 (six) hours as needed for diarrhea or loose stools.  . Loratadine (CLARITIN REDITABS) 5 MG TBDP TAKE 2 TABLETS (10MG ) BY MOUTH EVERY DAY AS NEEDED FOR ALLERGIES  . LORazepam (ATIVAN) 1 MG tablet Take 1 mg by mouth every 12 (twelve) hours.  . Melatonin 3 MG TABS Take 1 tablet by mouth at bedtime.   . memantine (NAMENDA) 10 MG tablet Take 10 mg by mouth 2 (two) times daily.   . Nutritional Supplement LIQD Take 120 mLs by mouth 2 (two) times a day. MedPass  . ondansetron (ZOFRAN) 4 MG tablet Take 4 mg by mouth every 6 (six) hours as needed for nausea or vomiting.  . pantoprazole (PROTONIX) 40 MG tablet Take 40 mg by mouth daily.   . polyethylene glycol (MIRALAX / GLYCOLAX) packet Take 17 g by mouth daily as needed.   . traMADol (ULTRAM) 50 MG tablet Take 1 tablet (50 mg total) by mouth See admin instructions. Give one hour prior to PT, hold for sedation or respiratory depression  . traMADol (ULTRAM) 50 MG tablet Take 1 tablet (50 mg total) by mouth every 8 (eight) hours as needed.  . ziprasidone (GEODON) 80 MG capsule Give one by mouth twice a day for Psychosis  . [DISCONTINUED] LORazepam (ATIVAN) 2 MG tablet Take 0.5 tablets (1 mg total) by mouth every 12 (twelve) hours.   No facility-administered encounter medications on file as of 06/01/2019.     Review of Systems  Unable to obtain due to dementia    Immunization History  Administered Date(s) Administered  . Influenza-Unspecified 03/17/2017, 09/09/2017, 08/17/2018  . PPD Test 03/24/2017, 06/02/2018, 06/08/2018  . Pneumococcal Polysaccharide-23 03/17/2017, 06/12/2018   Pertinent  Health Maintenance Due  Topic Date Due  . URINE MICROALBUMIN  07/02/2019 (Originally 12/21/1955)  . DEXA SCAN  07/02/2019 (Originally 12/21/2008)  . PNA vac Low Risk Adult (2 of  2 - PCV13) 06/13/2019  . INFLUENZA VACCINE  06/18/2019  . COLONOSCOPY  Discontinued   Fall Risk  05/27/2018 05/14/2017  Falls in the past year? No Yes  Number falls in past yr: - 2 or more  Injury with Fall? - No     Vitals:   06/01/19 1242  BP: 110/68  Pulse: 72  Resp: 20  Temp: 97.8 F (36.6 C)  TempSrc: Oral  SpO2: 94%  Weight: 144 lb 3.2 oz (65.4 kg)  Height: 5\' 5"  (1.651 m)   Body mass index is 24 kg/m.  Physical Exam  GENERAL APPEARANCE: Well nourished. In no acute distress. Normal body habitus SKIN:  Skin is warm and dry.  MOUTH and THROAT: Lips are without lesions. Oral mucosa is moist and without lesions. Tongue is normal in shape, size, and color  and without lesions RESPIRATORY: Breathing is even & unlabored, BS CTAB CARDIAC: RRR, no murmur,no extra heart sounds, no edema GI: Abdomen soft, normal BS, no masses, no tenderness EXTREMITIES:  Able to move X 4 extremities NEUROLOGICAL: There is no tremor. Speech is clear. Alert to self, disoriented to time and place. PSYCHIATRIC:  Affect and behavior are appropriate  Labs reviewed: Recent Labs    07/19/18 07/21/18 12/17/18 0800  NA 138 141 139  K 3.5 3.4 3.4*  CL  --   --  104  CO2  --   --  27  GLUCOSE  --   --  98  BUN 15 15 25*  CREATININE 0.7 0.9 1.14*  CALCIUM  --   --  9.3   Recent Labs    07/21/18 08/05/18 12/17/18 0800  AST 79* 22 17  ALT 82* 7 9  ALKPHOS 652* 228* 77  BILITOT  --   --  0.6  PROT  --   --  7.3  ALBUMIN  --   --  4.4   Recent Labs    07/02/18 07/19/18 12/17/18 0800 04/30/19  WBC 5.2 4.5 8.9 6.2  NEUTROABS 3 3 5.7  --   HGB 12.8 13.8 12.9 13.4  HCT 36 40 39.0 39  MCV  --   --  93.5  --   PLT 260 185 271 261   Lab Results  Component Value Date   TSH 2.86 07/02/2018   Lab Results  Component Value Date   HGBA1C 5.4 03/15/2018   Lab Results  Component Value Date   CHOL 139 03/15/2018   HDL 46 03/15/2018   LDLCALC 74 03/15/2018   TRIG 97 03/15/2018     Significant Diagnostic Results in last 30 days:  No results found.  Assessment/Plan  1. Insomnia, unspecified type - noted to be sleeping even during daytime and no reported insomnia, will discontinue Melatonin  2. Generalized pain - due to Tramadol possibly causing serotonin syndrome when combined with her psychotropics, will discontinue tramadol and start acetaminophen 325 mg 2 tabs = 350 mg every 6 hours as needed for pain   Family/ staff Communication: Discussed plan of care with resident and charge nurse.  Labs/tests ordered:  None  Goals of care: Long-term care   Kenard GowerMonina Medina-Vargas, DNP, FNP-BC [facility] [phone] (Monday-Friday 8:00 a.m. - 5:00 p.m.) [phone] (after hours)

## 2019-06-02 DIAGNOSIS — M6281 Muscle weakness (generalized): Secondary | ICD-10-CM | POA: Diagnosis not present

## 2019-06-02 DIAGNOSIS — G20 Parkinson's disease: Secondary | ICD-10-CM | POA: Diagnosis not present

## 2019-06-02 DIAGNOSIS — R1311 Dysphagia, oral phase: Secondary | ICD-10-CM | POA: Diagnosis not present

## 2019-06-03 DIAGNOSIS — R1311 Dysphagia, oral phase: Secondary | ICD-10-CM | POA: Diagnosis not present

## 2019-06-03 DIAGNOSIS — G20 Parkinson's disease: Secondary | ICD-10-CM | POA: Diagnosis not present

## 2019-06-03 DIAGNOSIS — M6281 Muscle weakness (generalized): Secondary | ICD-10-CM | POA: Diagnosis not present

## 2019-06-06 ENCOUNTER — Other Ambulatory Visit: Payer: Self-pay

## 2019-06-06 DIAGNOSIS — R1311 Dysphagia, oral phase: Secondary | ICD-10-CM | POA: Diagnosis not present

## 2019-06-06 DIAGNOSIS — G20 Parkinson's disease: Secondary | ICD-10-CM | POA: Diagnosis not present

## 2019-06-06 DIAGNOSIS — M6281 Muscle weakness (generalized): Secondary | ICD-10-CM | POA: Diagnosis not present

## 2019-06-06 MED ORDER — LORAZEPAM 1 MG PO TABS: 1.0000 mg | ORAL_TABLET | Freq: Two times a day (BID) | ORAL | 0 refills | Status: DC

## 2019-06-07 DIAGNOSIS — G20 Parkinson's disease: Secondary | ICD-10-CM | POA: Diagnosis not present

## 2019-06-07 DIAGNOSIS — M6281 Muscle weakness (generalized): Secondary | ICD-10-CM | POA: Diagnosis not present

## 2019-06-07 DIAGNOSIS — R1311 Dysphagia, oral phase: Secondary | ICD-10-CM | POA: Diagnosis not present

## 2019-06-08 DIAGNOSIS — G20 Parkinson's disease: Secondary | ICD-10-CM | POA: Diagnosis not present

## 2019-06-08 DIAGNOSIS — R1311 Dysphagia, oral phase: Secondary | ICD-10-CM | POA: Diagnosis not present

## 2019-06-08 DIAGNOSIS — M6281 Muscle weakness (generalized): Secondary | ICD-10-CM | POA: Diagnosis not present

## 2019-06-09 DIAGNOSIS — M6281 Muscle weakness (generalized): Secondary | ICD-10-CM | POA: Diagnosis not present

## 2019-06-09 DIAGNOSIS — G20 Parkinson's disease: Secondary | ICD-10-CM | POA: Diagnosis not present

## 2019-06-09 DIAGNOSIS — R1311 Dysphagia, oral phase: Secondary | ICD-10-CM | POA: Diagnosis not present

## 2019-06-10 DIAGNOSIS — G20 Parkinson's disease: Secondary | ICD-10-CM | POA: Diagnosis not present

## 2019-06-10 DIAGNOSIS — B342 Coronavirus infection, unspecified: Secondary | ICD-10-CM | POA: Diagnosis not present

## 2019-06-10 DIAGNOSIS — R1311 Dysphagia, oral phase: Secondary | ICD-10-CM | POA: Diagnosis not present

## 2019-06-10 DIAGNOSIS — M6281 Muscle weakness (generalized): Secondary | ICD-10-CM | POA: Diagnosis not present

## 2019-06-13 DIAGNOSIS — G20 Parkinson's disease: Secondary | ICD-10-CM | POA: Diagnosis not present

## 2019-06-13 DIAGNOSIS — R1311 Dysphagia, oral phase: Secondary | ICD-10-CM | POA: Diagnosis not present

## 2019-06-13 DIAGNOSIS — M6281 Muscle weakness (generalized): Secondary | ICD-10-CM | POA: Diagnosis not present

## 2019-06-14 DIAGNOSIS — M6281 Muscle weakness (generalized): Secondary | ICD-10-CM | POA: Diagnosis not present

## 2019-06-14 DIAGNOSIS — R1311 Dysphagia, oral phase: Secondary | ICD-10-CM | POA: Diagnosis not present

## 2019-06-14 DIAGNOSIS — G20 Parkinson's disease: Secondary | ICD-10-CM | POA: Diagnosis not present

## 2019-06-15 DIAGNOSIS — M6281 Muscle weakness (generalized): Secondary | ICD-10-CM | POA: Diagnosis not present

## 2019-06-15 DIAGNOSIS — G20 Parkinson's disease: Secondary | ICD-10-CM | POA: Diagnosis not present

## 2019-06-15 DIAGNOSIS — R1311 Dysphagia, oral phase: Secondary | ICD-10-CM | POA: Diagnosis not present

## 2019-06-16 DIAGNOSIS — R1311 Dysphagia, oral phase: Secondary | ICD-10-CM | POA: Diagnosis not present

## 2019-06-16 DIAGNOSIS — M6281 Muscle weakness (generalized): Secondary | ICD-10-CM | POA: Diagnosis not present

## 2019-06-16 DIAGNOSIS — G20 Parkinson's disease: Secondary | ICD-10-CM | POA: Diagnosis not present

## 2019-06-17 DIAGNOSIS — M6281 Muscle weakness (generalized): Secondary | ICD-10-CM | POA: Diagnosis not present

## 2019-06-17 DIAGNOSIS — R1311 Dysphagia, oral phase: Secondary | ICD-10-CM | POA: Diagnosis not present

## 2019-06-17 DIAGNOSIS — G20 Parkinson's disease: Secondary | ICD-10-CM | POA: Diagnosis not present

## 2019-06-22 ENCOUNTER — Other Ambulatory Visit: Payer: Self-pay | Admitting: Adult Health

## 2019-06-22 MED ORDER — LORAZEPAM 1 MG PO TABS: 1.0000 mg | ORAL_TABLET | Freq: Two times a day (BID) | ORAL | 0 refills | Status: DC

## 2019-06-28 ENCOUNTER — Non-Acute Institutional Stay (SKILLED_NURSING_FACILITY): Payer: Medicare Other | Admitting: Internal Medicine

## 2019-06-28 ENCOUNTER — Encounter: Payer: Self-pay | Admitting: Internal Medicine

## 2019-06-28 DIAGNOSIS — G20 Parkinson's disease: Secondary | ICD-10-CM

## 2019-06-28 DIAGNOSIS — I10 Essential (primary) hypertension: Secondary | ICD-10-CM

## 2019-06-28 DIAGNOSIS — F0150 Vascular dementia without behavioral disturbance: Secondary | ICD-10-CM

## 2019-06-28 DIAGNOSIS — R1311 Dysphagia, oral phase: Secondary | ICD-10-CM

## 2019-06-28 NOTE — Progress Notes (Signed)
NURSING HOME LOCATION:  [facility] ROOM NUMBER:  225-B  CODE STATUS:  Full Code  PCP:  Hendricks Limes, MD  [place] [address]   This is a nursing facility follow up of chronic medical diagnoses.  Interim medical record and care since last [place] visit was updated with review of diagnostic studies and change in clinical status since last visit were documented.  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  She also has schizoaffective disorder as well as probable Parkinson's.  Other diagnoses include recurrent falls with complication of rhabdomyolysis.  She has essential hypertension, dyslipidemia, GERD, fibromyalgia, vascular dementia, and anxiety/depression.  She is on multiple antipsychotic medications including atypicals.  These include lithium as well as ziprasidone. She has had a hysterectomy as well as cholecystectomy.  Earlier in her residence, I had weaned and discontinued the Parkinson's drugs to assess whether the parkinsonian picture was related to medications.  There was a demonstrable deterioration in her status prompting reinitiation of these agents until neurologic follow-up could be completed. CBC, chemistries and renal function, and TSH are current and normal or therapeutic.   Review of systems: Dementia invalidated responses.  She is unable to provide the date.  Her only validated complaint was that her "tremors are worse".  When asked the location of the tremors she stated "all over".  Constitutional: No fever, significant weight change, fatigue  Eyes: No redness, discharge, pain, vision change ENT/mouth: No nasal congestion,  purulent discharge, earache, change in hearing, sore throat  Cardiovascular: No chest pain, palpitations, paroxysmal nocturnal dyspnea, claudication, edema  Respiratory: No cough, sputum production, hemoptysis, DOE, significant snoring,  apnea   Gastrointestinal: No heartburn, dysphagia, abdominal pain, nausea /vomiting, rectal bleeding, melena, change in bowels Genitourinary: No dysuria, hematuria, pyuria, incontinence, nocturia Musculoskeletal: No joint stiffness, joint swelling, weakness, pain Dermatologic: No rash, pruritus, change in appearance of skin Neurologic: No dizziness, headache, syncope, seizures, numbness, tingling Psychiatric: No significant anxiety, depression, insomnia, anorexia Endocrine: No change in hair/skin/nails, excessive thirst, excessive hunger, excessive urination  Hematologic/lymphatic: No significant bruising, lymphadenopathy, abnormal bleeding Allergy/immunology: No itchy/watery eyes, significant sneezing, urticaria, angioedema  Physical exam:  Pertinent or positive findings: She exhibits masked facies,almost void of any expression.  Heart rhythm and rate were slightly irregular.  Dorsalis pedis pulses are stronger than posterior tibial pulses.  She exhibits a resting tremor of both hands which gets worse with intentional movement.  She also exhibited somewhat of an asterixis pattern of movement of the toes.  The right great toenail was markedly thickened and deformed.  She was weak to opposition in both the upper and lower extremities.  General appearance: Adequately nourished; no acute distress, increased work of breathing is present.   Lymphatic: No lymphadenopathy about the head, neck, axilla. Eyes: No conjunctival inflammation or lid edema is present. There is no scleral icterus. Ears:  External ear exam shows no significant lesions or deformities.   Nose:  External nasal examination shows no deformity or inflammation. Nasal mucosa are pink and moist without lesions, exudates Oral exam:  Lips and gums are healthy appearing.  Neck:  No thyromegaly, masses, tenderness noted.    Heart:  No gallop, murmur, click, rub .  Lungs: Chest clear to auscultation without wheezes, rhonchi, rales, rubs.  Abdomen: Bowel sounds are normal. Abdomen is soft and nontender with no organomegaly, hernias, masses. GU: Deferred  Extremities:  No cyanosis, clubbing, edema  Neurologic exam : Balance, Rhomberg, finger to nose testing could not be completed due to clinical state Skin: Warm & dry w/o tenting. No significant lesions or rash.  See summary under each active problem in the Problem List with associated updated therapeutic plan

## 2019-06-28 NOTE — Assessment & Plan Note (Signed)
Psych NP monitor and follow-up is critical because of her complicated psychotropic polypharmacy.

## 2019-06-28 NOTE — Assessment & Plan Note (Addendum)
There was progression of the parkinsonian picture off Parkinson's drugs prompting reinitiation.  Neurology follow-up will be completed once the [facility] is lifted.

## 2019-06-28 NOTE — Assessment & Plan Note (Addendum)
BP controlled; no change in antihypertensive medications.  With her history of TIAs and stroke slight permissive hypertension may possibly be allowed by Neurology.

## 2019-06-28 NOTE — Assessment & Plan Note (Addendum)
Speech Therapy monitor to continue @ SNF

## 2019-06-29 NOTE — Patient Instructions (Signed)
See assessment and plan under each diagnosis in the problem list and acutely for this visit 

## 2019-06-30 ENCOUNTER — Encounter: Payer: Self-pay | Admitting: Adult Health

## 2019-06-30 ENCOUNTER — Non-Acute Institutional Stay (SKILLED_NURSING_FACILITY): Payer: Medicare Other | Admitting: Adult Health

## 2019-06-30 DIAGNOSIS — F0150 Vascular dementia without behavioral disturbance: Secondary | ICD-10-CM

## 2019-06-30 DIAGNOSIS — F419 Anxiety disorder, unspecified: Secondary | ICD-10-CM | POA: Diagnosis not present

## 2019-06-30 DIAGNOSIS — G20 Parkinson's disease: Secondary | ICD-10-CM

## 2019-06-30 DIAGNOSIS — G8929 Other chronic pain: Secondary | ICD-10-CM

## 2019-06-30 DIAGNOSIS — F251 Schizoaffective disorder, depressive type: Secondary | ICD-10-CM | POA: Diagnosis not present

## 2019-06-30 DIAGNOSIS — J45909 Unspecified asthma, uncomplicated: Secondary | ICD-10-CM

## 2019-06-30 DIAGNOSIS — M545 Low back pain: Secondary | ICD-10-CM

## 2019-06-30 DIAGNOSIS — K219 Gastro-esophageal reflux disease without esophagitis: Secondary | ICD-10-CM | POA: Diagnosis not present

## 2019-06-30 NOTE — Progress Notes (Signed)
Location:  [place] Room Number: 203-A Place of Service:  SNF (31) Provider:  Durenda Age, DNP, FNP-BC  Patient Care Team: Hendricks Limes, MD as PCP - General (Internal Medicine) Center, [place] ([place] Rolm Baptise as Physician Assistant (Internal Medicine)  Extended Emergency Contact Information Primary Emergency Contact: Vivia [place] of [place] Phone: [phone] Relation: Daughter Secondary Emergency Contact: Merleen Milliner [place] of [place] Phone: [phone] Relation: Daughter  Code Status:  Full Code  Goals of care: Advanced Directive information Advanced Directives 06/01/2019  Does Patient Have a Medical Advance Directive? Yes  Type of Advance Directive (No Data)  Does patient want to make changes to medical advance directive? No - Patient declined  Would patient like information on creating a medical advance directive? -  Pre-existing out of facility DNR order (yellow form or pink MOST form) -     Chief Complaint  Patient presents with  . Advanced Directive    Care Plan Meeting    HPI:  Pt is a 73 y.o. female seen today for a care plan meeting. She is a long-term care resident of [facility]. She has a PMH of Parkinson's disease, fibromyalgia, dementia, TIA, and GERD. Care plan meeting was attended by NP, social worker,  MDS coordinator and daughter who attended via telephone conference. She has code status as full code. Daughter does not want her to have feeding tubes.  Current medications were discussed.  Daughter mentioned that resident had a bad reaction to gabapentin at her previous nursing facility so she requested for it to be discontinued. Resident was seen in her room and does not want to participate in the meeting. Daughter requested for her psychotropic medications not to be changed.  She said that it took several trials before  her hallucinations were controlled. No reported hallucinations by staff. She is pleasant and cooperative. The meeting lasted for 30 minutes.   Past Medical History:  Diagnosis Date  . Allergy   . Anxiety   . Arthritis   . Asthma   . Benign essential hypertension 03/18/2017  . Cataract   . Dementia ([place])   . Depression   . Difficulty walking   . Dysphagia, oral phase   . Fibromyalgia   . GERD (gastroesophageal reflux disease)   . Hallucinations   . Hyperlipidemia   . Hypertension   . Lower extremity edema   . Parkinson disease ([place])   . Repeated falls   . Rhabdomyolysis   . Stroke ([place])   . TIA (transient ischemic attack)   . Urinary incontinence    Past Surgical History:  Procedure Laterality Date  . ABDOMINAL HYSTERECTOMY    . CATARACT EXTRACTION    . CHOLECYSTECTOMY    . TONSILLECTOMY AND ADENOIDECTOMY      Allergies  Allergen Reactions  . Hydrocodone Other (See Comments)    GI upset    Outpatient Encounter Medications as of 06/30/2019  Medication Sig  . acetaminophen (TYLENOL) 325 MG tablet Take 650 mg by mouth every 6 (six) hours as needed for mild pain or moderate pain.  Marland Kitchen albuterol (PROVENTIL HFA;VENTOLIN HFA) 108 (90 Base) MCG/ACT inhaler Inhale 2 puffs into the lungs every 6 (six) hours as needed for wheezing or shortness of breath.  Marland Kitchen aspirin EC 81 MG tablet Take 81 mg by mouth daily.  . bisacodyl (BISACODYL LAXATIVE) 10 MG suppository Place 10 mg rectally as needed for moderate constipation.  Marland Kitchen  busPIRone (BUSPAR) 15 MG tablet Take 15 mg by mouth 3 (three) times daily.  . calcium carbonate (TUMS - DOSED IN MG ELEMENTAL CALCIUM) 500 MG chewable tablet Chew 1 tablet by mouth every 6 (six) hours as needed for indigestion or heartburn.  . carbidopa-levodopa (SINEMET IR) 25-100 MG tablet Take 1 tablet by mouth 3 (three) times daily.  . cholecalciferol (VITAMIN D) 1000 units tablet Take 1,000 Units by mouth daily.   Marland Kitchen. donepezil (ARICEPT) 10 MG tablet Take 10 mg by  mouth at bedtime.   . DULoxetine (CYMBALTA) 30 MG capsule Give 3 tablets (90mg ) by mouth daily  . fluticasone (FLONASE) 50 MCG/ACT nasal spray Place 2 sprays into both nostrils daily.   Marland Kitchen. gabapentin (NEURONTIN) 100 MG capsule Take 100 mg by mouth at bedtime.   . lidocaine (LIDODERM) 5 % Place 2 patches onto the skin daily. Apply two patches tyo lower back daily for pain  . lithium carbonate 300 MG capsule Take 300 mg by mouth at bedtime.  Marland Kitchen. loperamide (IMODIUM) 2 MG capsule Take 2 mg by mouth every 6 (six) hours as needed for diarrhea or loose stools.  . Loratadine (CLARITIN REDITABS) 5 MG TBDP Take 10 mg by mouth daily as needed.   Marland Kitchen. LORazepam (ATIVAN) 1 MG tablet Take 1 tablet (1 mg total) by mouth every 12 (twelve) hours.  . memantine (NAMENDA) 10 MG tablet Take 10 mg by mouth 2 (two) times daily.   . Nutritional Supplement LIQD Take 120 mLs by mouth 2 (two) times a day. MedPass  . ondansetron (ZOFRAN) 4 MG tablet Take 4 mg by mouth every 6 (six) hours as needed for nausea or vomiting.  . pantoprazole (PROTONIX) 40 MG tablet Take 40 mg by mouth daily.   . polyethylene glycol (MIRALAX / GLYCOLAX) packet Take 17 g by mouth daily as needed.   . ziprasidone (GEODON) 80 MG capsule Take 80 mg by mouth 2 (two) times daily.    No facility-administered encounter medications on file as of 06/30/2019.     Review of Systems  Unable to obtain due to dementia    Immunization History  Administered Date(s) Administered  . Influenza-Unspecified 03/17/2017, 09/09/2017, 08/17/2018  . PPD Test 03/24/2017, 06/02/2018, 06/08/2018  . Pneumococcal Polysaccharide-23 03/17/2017, 06/12/2018   Pertinent  Health Maintenance Due  Topic Date Due  . PNA vac Low Risk Adult (2 of 2 - PCV13) 06/13/2019  . INFLUENZA VACCINE  06/18/2019  . URINE MICROALBUMIN  07/02/2019 (Originally 12/21/1955)  . DEXA SCAN  07/02/2019 (Originally 12/21/2008)  . COLONOSCOPY  Discontinued   Fall Risk  05/27/2018 05/14/2017  Falls in the  past year? No Yes  Number falls in past yr: - 2 or more  Injury with Fall? - No     Vitals:   06/30/19 0849  BP: 103/62  Pulse: 67  Resp: 18  Temp: (!) 97.4 F (36.3 C)  TempSrc: Oral  Weight: 145 lb 6.4 oz (66 kg)  Height: 5\' 5"  (1.651 m)   Body mass index is 24.2 kg/m.  Physical Exam  GENERAL APPEARANCE: Well nourished. In no acute distress. Normal body habitus SKIN:  Skin is warm and dry.  MOUTH and THROAT: Lips are without lesions. Oral mucosa is moist and without lesions. Tongue is normal in shape, size, and color and without lesions RESPIRATORY: Breathing is even & unlabored, BS CTAB CARDIAC: RRR, no murmur,no extra heart sounds, no edema GI: Abdomen soft, normal BS, no masses, no tenderness EXTREMITIES:  Able to move  X 4 extremities NEUROLOGICAL: There is no tremor. Speech is clear. Alert to self, disoriented to time and place. PSYCHIATRIC:  Affect and behavior are appropriate  Labs reviewed: Recent Labs    07/21/18 12/17/18 0800 04/30/19  NA 141 139 144  K 3.4 3.4* 3.7  CL  --  104  --   CO2  --  27  --   GLUCOSE  --  98  --   BUN 15 25* 16  CREATININE 0.9 1.14* 0.7  CALCIUM  --  9.3  --    Recent Labs    08/05/18 12/17/18 0800 04/30/19  AST 22 17 12*  ALT 7 9 6*  ALKPHOS 228* 77 88  BILITOT  --  0.6  --   PROT  --  7.3  --   ALBUMIN  --  4.4  --    Recent Labs    07/19/18 12/17/18 0800 04/30/19  WBC 4.5 8.9 6.2  NEUTROABS 3 5.7 4  HGB 13.8 12.9 13.4  HCT 40 39.0 39  MCV  --  93.5  --   PLT 185 271 261   Lab Results  Component Value Date   TSH 4.70 04/30/2019   Lab Results  Component Value Date   HGBA1C 5.4 03/15/2018   Lab Results  Component Value Date   CHOL 139 03/15/2018   HDL 46 03/15/2018   LDLCALC 74 03/15/2018   TRIG 97 03/15/2018    Assessment/Plan  1. GERD without esophagitis -No complaints of heartburn, continue pantoprazole 40 mg 1 tab daily  2. Chronic bilateral low back pain without sciatica -Denies pain on  her lower back today, continue lidocaine 5% patches  3. Asthma with allergic rhinitis without complication, unspecified asthma severity -No wheezing, continue albuterol PRN, fluticasone  4. Schizoaffective disorder, depressive type (HCC) -No reported hallucinations no agitation, continue duloxetine, lithium carbonate and ziprasidone  5. Anxiety -Mood this is stable, continue Ativan and buspirone  6. Parkinson disease (HCC) -No tremors noted, continue carbidopa-levodopa  7. Vascular dementia without behavioral disturbance (HCC) -Continue memantine and donepezil, fall precautions and supportive care    Family/ staff Communication: Discussed plan of care with daughter and IDT.  Labs/tests ordered: None  Goals of care:   Long-term care.   Kenard GowerMonina Medina-Vargas, DNP, FNP-BC [facility] [phone] (Monday-Friday 8:00 a.m. - 5:00 p.m.) [phone] (after hours)

## 2019-07-07 ENCOUNTER — Other Ambulatory Visit: Payer: Self-pay | Admitting: Adult Health

## 2019-07-07 MED ORDER — LORAZEPAM 1 MG PO TABS: 1.0000 mg | ORAL_TABLET | Freq: Two times a day (BID) | ORAL | 0 refills | Status: DC

## 2019-07-13 DIAGNOSIS — F419 Anxiety disorder, unspecified: Secondary | ICD-10-CM | POA: Diagnosis not present

## 2019-07-13 DIAGNOSIS — G20 Parkinson's disease: Secondary | ICD-10-CM | POA: Diagnosis not present

## 2019-07-13 DIAGNOSIS — F329 Major depressive disorder, single episode, unspecified: Secondary | ICD-10-CM | POA: Diagnosis not present

## 2019-07-13 DIAGNOSIS — G3183 Dementia with Lewy bodies: Secondary | ICD-10-CM | POA: Diagnosis not present

## 2019-07-14 DIAGNOSIS — G20 Parkinson's disease: Secondary | ICD-10-CM | POA: Diagnosis not present

## 2019-07-14 DIAGNOSIS — M6281 Muscle weakness (generalized): Secondary | ICD-10-CM | POA: Diagnosis not present

## 2019-07-15 DIAGNOSIS — M6281 Muscle weakness (generalized): Secondary | ICD-10-CM | POA: Diagnosis not present

## 2019-07-15 DIAGNOSIS — G20 Parkinson's disease: Secondary | ICD-10-CM | POA: Diagnosis not present

## 2019-07-18 DIAGNOSIS — M6281 Muscle weakness (generalized): Secondary | ICD-10-CM | POA: Diagnosis not present

## 2019-07-18 DIAGNOSIS — G20 Parkinson's disease: Secondary | ICD-10-CM | POA: Diagnosis not present

## 2019-07-19 ENCOUNTER — Non-Acute Institutional Stay (SKILLED_NURSING_FACILITY): Payer: Medicare Other | Admitting: Adult Health

## 2019-07-19 ENCOUNTER — Encounter: Payer: Self-pay | Admitting: Adult Health

## 2019-07-19 DIAGNOSIS — F251 Schizoaffective disorder, depressive type: Secondary | ICD-10-CM

## 2019-07-19 DIAGNOSIS — G20A1 Parkinson's disease without dyskinesia, without mention of fluctuations: Secondary | ICD-10-CM

## 2019-07-19 DIAGNOSIS — K219 Gastro-esophageal reflux disease without esophagitis: Secondary | ICD-10-CM | POA: Diagnosis not present

## 2019-07-19 DIAGNOSIS — F0150 Vascular dementia without behavioral disturbance: Secondary | ICD-10-CM

## 2019-07-19 DIAGNOSIS — G20 Parkinson's disease: Secondary | ICD-10-CM | POA: Diagnosis not present

## 2019-07-19 DIAGNOSIS — F419 Anxiety disorder, unspecified: Secondary | ICD-10-CM

## 2019-07-19 DIAGNOSIS — J309 Allergic rhinitis, unspecified: Secondary | ICD-10-CM | POA: Diagnosis not present

## 2019-07-19 DIAGNOSIS — M6281 Muscle weakness (generalized): Secondary | ICD-10-CM | POA: Diagnosis not present

## 2019-07-19 NOTE — Progress Notes (Signed)
Location:  [facility] Room Number: 225/B Place of Service:  SNF (31) Provider:  Kenard Gower, DNP, FNP-BC  Patient Care Team: Pecola Lawless, MD as PCP - General (Internal Medicine) Center, [facility] Synetta Shadow as Physician Assistant (Internal Medicine)  Extended Emergency Contact Information Primary Emergency Contact: [name] of [place] Home Phone: [phone] Relation: Daughter Secondary Emergency Contact: [name] [place] of [place] Home Phone: [phone] Relation: Daughter  Code Status:  Full Code  Goals of care: Advanced Directive information Advanced Directives 07/19/2019  Does Patient Have a Medical Advance Directive? Yes  Type of Advance Directive (No Data)  Does patient want to make changes to medical advance directive? No - Patient declined  Would patient like information on creating a medical advance directive? -  Pre-existing out of facility DNR order (yellow form or pink MOST form) -     Chief Complaint  Patient presents with  . Medical Management of Chronic Issues    Routine visit of medical management    HPI:  Pt is a 73 y.o. female seen today for medical management of chronic diseases. She has PMH of parkinson's disease, fibromyalgia, dementia, TIA and GERD. She was seen sitting up in a geri-chair while watching television. Noted to have mild tremors on BUE.She is currently having restorative program for eating/swallowing. She needs queing for increased consumption and feeding assistance when needed. No reported hallucination nor agitation. She takes lithium and Ziprasidone for Schizoaffective disorder   Past Medical History:  Diagnosis Date  . Allergy   . Anxiety   . Arthritis   . Asthma   . Benign essential hypertension 03/18/2017  . Cataract   . Dementia (HCC)   . Depression   . Difficulty walking   . Dysphagia, oral phase   .  Fibromyalgia   . GERD (gastroesophageal reflux disease)   . Hallucinations   . Hyperlipidemia   . Hypertension   . Lower extremity edema   . Parkinson disease (HCC)   . Repeated falls   . Rhabdomyolysis   . Stroke (HCC)   . TIA (transient ischemic attack)   . Urinary incontinence    Past Surgical History:  Procedure Laterality Date  . ABDOMINAL HYSTERECTOMY    . CATARACT EXTRACTION    . CHOLECYSTECTOMY    . TONSILLECTOMY AND ADENOIDECTOMY      Allergies  Allergen Reactions  . Hydrocodone Other (See Comments)    GI upset    Outpatient Encounter Medications as of 07/19/2019  Medication Sig  . acetaminophen (TYLENOL) 325 MG tablet Take 650 mg by mouth every 6 (six) hours as needed for mild pain or moderate pain.  Marland Kitchen albuterol (PROVENTIL HFA;VENTOLIN HFA) 108 (90 Base) MCG/ACT inhaler Inhale 2 puffs into the lungs every 6 (six) hours as needed for wheezing or shortness of breath.  Marland Kitchen aspirin EC 81 MG tablet Take 81 mg by mouth daily.  . bisacodyl (BISACODYL LAXATIVE) 10 MG suppository Place 10 mg rectally as needed for moderate constipation.  . busPIRone (BUSPAR) 15 MG tablet Take 15 mg by mouth 3 (three) times daily.  . calcium carbonate (TUMS - DOSED IN MG ELEMENTAL CALCIUM) 500 MG chewable tablet Chew 1 tablet by mouth every 6 (six) hours as needed for indigestion or heartburn.  . carbidopa-levodopa (SINEMET IR) 25-100 MG tablet Take 1 tablet by mouth 3 (three) times daily.  . cholecalciferol (VITAMIN D) 1000 units tablet Take 1,000 Units by mouth daily.   Marland Kitchen  donepezil (ARICEPT) 10 MG tablet Take 10 mg by mouth at bedtime.   . DULoxetine (CYMBALTA) 30 MG capsule Give 3 tablets (90mg ) by mouth daily  . fluticasone (FLONASE) 50 MCG/ACT nasal spray Place 2 sprays into both nostrils daily.   Marland Kitchen lidocaine (LIDODERM) 5 % Place 2 patches onto the skin daily. Apply two patches tyo lower back daily for pain  . lithium carbonate 300 MG capsule Take 300 mg by mouth at bedtime.  Marland Kitchen loperamide  (IMODIUM) 2 MG capsule Take 2 mg by mouth every 6 (six) hours as needed for diarrhea or loose stools.  . Loratadine (CLARITIN REDITABS) 5 MG TBDP Take 10 mg by mouth daily as needed.   Marland Kitchen LORazepam (ATIVAN) 1 MG tablet Take 1 tablet (1 mg total) by mouth every 12 (twelve) hours.  . memantine (NAMENDA) 10 MG tablet Take 10 mg by mouth 2 (two) times daily.   . Nutritional Supplement LIQD Take 120 mLs by mouth 2 (two) times a day. MedPass  . ondansetron (ZOFRAN) 4 MG tablet Take 4 mg by mouth every 6 (six) hours as needed for nausea or vomiting.  . pantoprazole (PROTONIX) 40 MG tablet Take 40 mg by mouth daily.   . polyethylene glycol (MIRALAX / GLYCOLAX) packet Take 17 g by mouth daily as needed.   . ziprasidone (GEODON) 80 MG capsule Take 80 mg by mouth 2 (two) times daily.   . [DISCONTINUED] gabapentin (NEURONTIN) 100 MG capsule Take 100 mg by mouth at bedtime.    No facility-administered encounter medications on file as of 07/19/2019.     Review of Systems  Unable to obtain due to dementia    Immunization History  Administered Date(s) Administered  . Influenza-Unspecified 03/17/2017, 09/09/2017, 08/17/2018  . PPD Test 03/24/2017, 06/02/2018, 06/08/2018  . Pneumococcal Polysaccharide-23 03/17/2017, 06/12/2018   Pertinent  Health Maintenance Due  Topic Date Due  . INFLUENZA VACCINE  08/18/2019 (Originally 06/18/2019)  . URINE MICROALBUMIN  08/18/2019 (Originally 12/21/1955)  . DEXA SCAN  08/18/2019 (Originally 12/21/2008)  . PNA vac Low Risk Adult (2 of 2 - PCV13) 08/18/2019 (Originally 06/13/2019)  . COLONOSCOPY  Discontinued   Fall Risk  05/27/2018 05/14/2017  Falls in the past year? No Yes  Number falls in past yr: - 2 or more  Injury with Fall? - No     Vitals:   07/19/19 1252  BP: 105/64  Pulse: 66  Resp: 18  Temp: (!) 97.2 F (36.2 C)  TempSrc: Oral  SpO2: 94%  Weight: 145 lb 6.4 oz (66 kg)  Height: 5\' 5"  (1.651 m)   Body mass index is 24.2 kg/m.  Physical Exam   GENERAL APPEARANCE: Well nourished. In no acute distress. Normal body habitus SKIN:  Skin is warm and dry.  MOUTH and THROAT: Lips are without lesions. Oral mucosa is moist and without lesions.  RESPIRATORY: Breathing is even & unlabored, BS CTAB CARDIAC: RRR, no murmur,no extra heart sounds, no edema GI: Abdomen soft, normal BS, no masses, no tenderness EXTREMITIES:  Able to move X 4 extremities NEUROLOGICAL: + tremor. Speech is clear. Alert to self, disoriented to time and place. PSYCHIATRIC:. Affect and behavior are appropriate  Labs reviewed: Recent Labs    07/21/18 12/17/18 0800 04/30/19  NA 141 139 144  K 3.4 3.4* 3.7  CL  --  104  --   CO2  --  27  --   GLUCOSE  --  98  --   BUN 15 25* 16  CREATININE 0.9  1.14* 0.7  CALCIUM  --  9.3  --    Recent Labs    08/05/18 12/17/18 0800 04/30/19  AST 22 17 12*  ALT 7 9 6*  ALKPHOS 228* 77 88  BILITOT  --  0.6  --   PROT  --  7.3  --   ALBUMIN  --  4.4  --    Recent Labs    12/17/18 0800 04/30/19  WBC 8.9 6.2  NEUTROABS 5.7 4  HGB 12.9 13.4  HCT 39.0 39  MCV 93.5  --   PLT 271 261   Lab Results  Component Value Date   TSH 4.70 04/30/2019   Lab Results  Component Value Date   HGBA1C 5.4 03/15/2018   Lab Results  Component Value Date   CHOL 139 03/15/2018   HDL 46 03/15/2018   LDLCALC 74 03/15/2018   TRIG 97 03/15/2018      Assessment/Plan  1. Allergic rhinitis, unspecified seasonality, unspecified trigger - no noted sneezing, continue Fluticasone nasal spray  2. Parkinson disease (HCC) - has mild tremors, continue Carbidopa-Levodopa  3. GERD without esophagitis - continue Pantoprazole  4. Schizoaffective disorder, depressive type (HCC) - no reported agitation nor hallucinations, continue Duloxetine, Lithium carbonate and Ziprasidone  5. Anxiety - mood is stable, continue Buspirone and Ativan  6. Vascular dementia without behavioral disturbance (HCC) - continue Donepezil and Memantine, fall  precautions and supportive care   Family/ staff Communication:  Discussed plan of care with resident.  Labs/tests ordered:  None  Goals of care:   Long-term care   Kenard GowerMonina Medina-Vargas, DNP, FNP-BC [facility] [phone] (Monday-Friday 8:00 a.m. - 5:00 p.m.) [phone] (after hours)

## 2019-07-20 DIAGNOSIS — M6281 Muscle weakness (generalized): Secondary | ICD-10-CM | POA: Diagnosis not present

## 2019-07-20 DIAGNOSIS — G20 Parkinson's disease: Secondary | ICD-10-CM | POA: Diagnosis not present

## 2019-07-21 DIAGNOSIS — M6281 Muscle weakness (generalized): Secondary | ICD-10-CM | POA: Diagnosis not present

## 2019-07-21 DIAGNOSIS — G20 Parkinson's disease: Secondary | ICD-10-CM | POA: Diagnosis not present

## 2019-07-21 DIAGNOSIS — J309 Allergic rhinitis, unspecified: Secondary | ICD-10-CM | POA: Insufficient documentation

## 2019-07-22 DIAGNOSIS — M6281 Muscle weakness (generalized): Secondary | ICD-10-CM | POA: Diagnosis not present

## 2019-07-22 DIAGNOSIS — G20 Parkinson's disease: Secondary | ICD-10-CM | POA: Diagnosis not present

## 2019-07-25 DIAGNOSIS — M6281 Muscle weakness (generalized): Secondary | ICD-10-CM | POA: Diagnosis not present

## 2019-07-25 DIAGNOSIS — G20 Parkinson's disease: Secondary | ICD-10-CM | POA: Diagnosis not present

## 2019-07-26 DIAGNOSIS — M6281 Muscle weakness (generalized): Secondary | ICD-10-CM | POA: Diagnosis not present

## 2019-07-26 DIAGNOSIS — G20 Parkinson's disease: Secondary | ICD-10-CM | POA: Diagnosis not present

## 2019-07-27 DIAGNOSIS — M6281 Muscle weakness (generalized): Secondary | ICD-10-CM | POA: Diagnosis not present

## 2019-07-27 DIAGNOSIS — G20 Parkinson's disease: Secondary | ICD-10-CM | POA: Diagnosis not present

## 2019-07-28 DIAGNOSIS — M6281 Muscle weakness (generalized): Secondary | ICD-10-CM | POA: Diagnosis not present

## 2019-07-28 DIAGNOSIS — G20 Parkinson's disease: Secondary | ICD-10-CM | POA: Diagnosis not present

## 2019-07-29 DIAGNOSIS — G20 Parkinson's disease: Secondary | ICD-10-CM | POA: Diagnosis not present

## 2019-07-29 DIAGNOSIS — M6281 Muscle weakness (generalized): Secondary | ICD-10-CM | POA: Diagnosis not present

## 2019-08-01 DIAGNOSIS — M6281 Muscle weakness (generalized): Secondary | ICD-10-CM | POA: Diagnosis not present

## 2019-08-01 DIAGNOSIS — G20 Parkinson's disease: Secondary | ICD-10-CM | POA: Diagnosis not present

## 2019-08-02 DIAGNOSIS — G20 Parkinson's disease: Secondary | ICD-10-CM | POA: Diagnosis not present

## 2019-08-02 DIAGNOSIS — M6281 Muscle weakness (generalized): Secondary | ICD-10-CM | POA: Diagnosis not present

## 2019-08-03 DIAGNOSIS — M6281 Muscle weakness (generalized): Secondary | ICD-10-CM | POA: Diagnosis not present

## 2019-08-03 DIAGNOSIS — G20 Parkinson's disease: Secondary | ICD-10-CM | POA: Diagnosis not present

## 2019-08-04 ENCOUNTER — Other Ambulatory Visit: Payer: Self-pay | Admitting: Internal Medicine

## 2019-08-04 DIAGNOSIS — G20 Parkinson's disease: Secondary | ICD-10-CM | POA: Diagnosis not present

## 2019-08-04 DIAGNOSIS — M6281 Muscle weakness (generalized): Secondary | ICD-10-CM | POA: Diagnosis not present

## 2019-08-04 MED ORDER — LORAZEPAM 1 MG PO TABS: 1.0000 mg | ORAL_TABLET | Freq: Two times a day (BID) | ORAL | 0 refills | Status: DC

## 2019-08-05 DIAGNOSIS — G20 Parkinson's disease: Secondary | ICD-10-CM | POA: Diagnosis not present

## 2019-08-05 DIAGNOSIS — M6281 Muscle weakness (generalized): Secondary | ICD-10-CM | POA: Diagnosis not present

## 2019-08-08 DIAGNOSIS — G20 Parkinson's disease: Secondary | ICD-10-CM | POA: Diagnosis not present

## 2019-08-08 DIAGNOSIS — M6281 Muscle weakness (generalized): Secondary | ICD-10-CM | POA: Diagnosis not present

## 2019-08-09 DIAGNOSIS — G20 Parkinson's disease: Secondary | ICD-10-CM | POA: Diagnosis not present

## 2019-08-09 DIAGNOSIS — M6281 Muscle weakness (generalized): Secondary | ICD-10-CM | POA: Diagnosis not present

## 2019-08-10 DIAGNOSIS — M6281 Muscle weakness (generalized): Secondary | ICD-10-CM | POA: Diagnosis not present

## 2019-08-10 DIAGNOSIS — G20 Parkinson's disease: Secondary | ICD-10-CM | POA: Diagnosis not present

## 2019-08-11 DIAGNOSIS — M6281 Muscle weakness (generalized): Secondary | ICD-10-CM | POA: Diagnosis not present

## 2019-08-11 DIAGNOSIS — G20 Parkinson's disease: Secondary | ICD-10-CM | POA: Diagnosis not present

## 2019-08-12 DIAGNOSIS — M6281 Muscle weakness (generalized): Secondary | ICD-10-CM | POA: Diagnosis not present

## 2019-08-12 DIAGNOSIS — G20 Parkinson's disease: Secondary | ICD-10-CM | POA: Diagnosis not present

## 2019-08-15 DIAGNOSIS — G20 Parkinson's disease: Secondary | ICD-10-CM | POA: Diagnosis not present

## 2019-08-15 DIAGNOSIS — M6281 Muscle weakness (generalized): Secondary | ICD-10-CM | POA: Diagnosis not present

## 2019-08-16 DIAGNOSIS — M6281 Muscle weakness (generalized): Secondary | ICD-10-CM | POA: Diagnosis not present

## 2019-08-16 DIAGNOSIS — G20 Parkinson's disease: Secondary | ICD-10-CM | POA: Diagnosis not present

## 2019-08-17 ENCOUNTER — Non-Acute Institutional Stay (SKILLED_NURSING_FACILITY): Payer: Medicare Other | Admitting: Adult Health

## 2019-08-17 ENCOUNTER — Encounter: Payer: Self-pay | Admitting: Adult Health

## 2019-08-17 DIAGNOSIS — F419 Anxiety disorder, unspecified: Secondary | ICD-10-CM

## 2019-08-17 DIAGNOSIS — K219 Gastro-esophageal reflux disease without esophagitis: Secondary | ICD-10-CM

## 2019-08-17 DIAGNOSIS — M6281 Muscle weakness (generalized): Secondary | ICD-10-CM | POA: Diagnosis not present

## 2019-08-17 DIAGNOSIS — G20 Parkinson's disease: Secondary | ICD-10-CM

## 2019-08-17 DIAGNOSIS — J309 Allergic rhinitis, unspecified: Secondary | ICD-10-CM | POA: Diagnosis not present

## 2019-08-17 DIAGNOSIS — F0150 Vascular dementia without behavioral disturbance: Secondary | ICD-10-CM

## 2019-08-17 DIAGNOSIS — F251 Schizoaffective disorder, depressive type: Secondary | ICD-10-CM

## 2019-08-17 NOTE — Progress Notes (Signed)
Location:  [facility] Room Number: 225/B Place of Service:  SNF (31) Provider:  Kenard GowerMedina-Vargas, Aqsa Sensabaugh, DNP, FNP-BC  Patient Care Team: Pecola LawlessHopper, William F, MD as PCP - General (Internal Medicine) Center, [facility] (Skilled Nursing Facility) Synetta ShadowLassen, Arlo C, PA-C as Physician Assistant (Internal Medicine)  Extended Emergency Contact Information Primary Emergency Contact: Levy Pupaamsy,[name]  [place] Home Phone: [phone] Relation: Daughter Secondary Emergency Contact: [name] PaymentMiller,Amy  [place] Home Phone: [phone] Relation: Daughter  Code Status:  Full Code  Goals of care: Advanced Directive information Advanced Directives 08/17/2019  Does Patient Have a Medical Advance Directive? Yes  Type of Advance Directive (No Data)  Does patient want to make changes to medical advance directive? -  Would patient like information on creating a medical advance directive? -  Pre-existing out of facility DNR order (yellow form or pink MOST form) -     Chief Complaint  Patient presents with  . Medical Management of Chronic Issues    Routine Visit    HPI:  Pt is a 73 y.o. female seen today for medical management of chronic diseases.  She has PMH of Parkinson's disease, fibromyalgia, dementia, TIA and GERD.  She was seen today in her room.  She is currently having restorative training for eating/swallowing.  She refuses to participate in AROM.   Past Medical History:  Diagnosis Date  . Allergy   . Anxiety   . Arthritis   . Asthma   . Benign essential hypertension 03/18/2017  . Cataract   . Dementia (HCC)   . Depression   . Difficulty walking   . Dysphagia, oral phase   . Fibromyalgia   . GERD (gastroesophageal reflux disease)   . Hallucinations   . Hyperlipidemia   . Hypertension   . Lower extremity edema   . Parkinson disease (HCC)   . Repeated falls   . Rhabdomyolysis   . Stroke (HCC)   . TIA (transient ischemic  attack)   . Urinary incontinence    Past Surgical History:  Procedure Laterality Date  . ABDOMINAL HYSTERECTOMY    . CATARACT EXTRACTION    . CHOLECYSTECTOMY    . TONSILLECTOMY AND ADENOIDECTOMY      Allergies  Allergen Reactions  . Hydrocodone Other (See Comments)    GI upset    Outpatient Encounter Medications as of 08/17/2019  Medication Sig  . acetaminophen (TYLENOL) 325 MG tablet Take 650 mg by mouth every 6 (six) hours as needed for mild pain or moderate pain.  Marland Kitchen. albuterol (PROVENTIL HFA;VENTOLIN HFA) 108 (90 Base) MCG/ACT inhaler Inhale 2 puffs into the lungs every 6 (six) hours as needed for wheezing or shortness of breath.  Marland Kitchen. aspirin EC 81 MG tablet Take 81 mg by mouth daily.  . bisacodyl (BISACODYL LAXATIVE) 10 MG suppository Place 10 mg rectally as needed for moderate constipation.  . busPIRone (BUSPAR) 15 MG tablet Take 15 mg by mouth 3 (three) times daily.  . calcium carbonate (TUMS - DOSED IN MG ELEMENTAL CALCIUM) 500 MG chewable tablet Chew 1 tablet by mouth every 6 (six) hours as needed for indigestion or heartburn.  . carbidopa-levodopa (SINEMET IR) 25-100 MG tablet Take 1 tablet by mouth 3 (three) times daily.  . cholecalciferol (VITAMIN D) 1000 units tablet Take 1,000 Units by mouth daily.   Marland Kitchen. donepezil (ARICEPT) 10 MG tablet Take 10 mg by mouth at bedtime.   . DULoxetine (CYMBALTA) 30 MG capsule Give 3 tablets (90mg ) by mouth daily  .  fluticasone (FLONASE) 50 MCG/ACT nasal spray Place 2 sprays into both nostrils daily.   Marland Kitchen lidocaine (LIDODERM) 5 % Place 2 patches onto the skin daily. Apply two patches tyo lower back daily for pain  . lithium carbonate 300 MG capsule Take 300 mg by mouth at bedtime.  Marland Kitchen loperamide (IMODIUM) 2 MG capsule Take 2 mg by mouth every 6 (six) hours as needed for diarrhea or loose stools.  . Loratadine (CLARITIN REDITABS) 5 MG TBDP Take 10 mg by mouth daily as needed.   Marland Kitchen LORazepam (ATIVAN) 1 MG tablet Take 1 tablet (1 mg total) by mouth  every 12 (twelve) hours.  . magnesium hydroxide (MILK OF MAGNESIA) 400 MG/5ML suspension If no BM in 3 days, give 30 cc Milk of Magnesium p.o. x 1 dose in 24 hours as needed (Do not use standing constipation orders for residents with renal failure CFR less than 30. Contact MD for orders) (Physician Order)  . memantine (NAMENDA) 10 MG tablet Take 10 mg by mouth 2 (two) times daily.   . NON FORMULARY Diet Regular with thin liquids  . Nutritional Supplement LIQD Take 120 mLs by mouth 2 (two) times a day. MedPass  . ondansetron (ZOFRAN) 4 MG tablet Take 4 mg by mouth every 6 (six) hours as needed for nausea or vomiting.  . pantoprazole (PROTONIX) 40 MG tablet Take 40 mg by mouth daily.   . polyethylene glycol (MIRALAX / GLYCOLAX) packet Take 17 g by mouth daily as needed.   . Sodium Phosphates (RA SALINE ENEMA RE) If not relieved by Biscodyl suppository, give disposable Saline Enema rectally X 1 dose/24 hrs as needed (Do not use constipation standing orders for residents with renal failure/CFR less than 30. Contact MD for orders)(Physician Or  . ziprasidone (GEODON) 80 MG capsule Take 80 mg by mouth 2 (two) times daily.    No facility-administered encounter medications on file as of 08/17/2019.     Review of Systems  Unable to obtain due to dementia   Immunization History  Administered Date(s) Administered  . Influenza-Unspecified 03/17/2017, 09/09/2017, 08/17/2018  . PPD Test 03/24/2017, 06/02/2018, 06/08/2018  . Pneumococcal Polysaccharide-23 03/17/2017, 06/12/2018   Pertinent  Health Maintenance Due  Topic Date Due  . URINE MICROALBUMIN  12/21/1955  . DEXA SCAN  12/21/2008  . PNA vac Low Risk Adult (2 of 2 - PCV13) 06/13/2019  . INFLUENZA VACCINE  06/18/2019  . COLONOSCOPY  Discontinued   Fall Risk  05/27/2018 05/14/2017  Falls in the past year? No Yes  Number falls in past yr: - 2 or more  Injury with Fall? - No     Vitals:   08/17/19 1419  BP: 97/60  Pulse: 94  Resp: 17   Temp: (!) 97.3 F (36.3 C)  TempSrc: Oral  SpO2: 94%  Weight: 145 lb 9.6 oz (66 kg)  Height: 5\' 5"  (1.651 m)   Body mass index is 24.23 kg/m.  Physical Exam  GENERAL APPEARANCE: Well nourished. In no acute distress. Normal body habitus SKIN:  Skin is warm and dry.  MOUTH and THROAT: Lips are without lesions. Oral mucosa is moist and without lesions.  RESPIRATORY: Breathing is even & unlabored, BS CTAB CARDIAC: RRR, no murmur,no extra heart sounds, no edema GI: Abdomen soft, normal BS, no masses, no tenderness EXTREMITIES:  Able to move X 4 extremities NEUROLOGICAL: There is no tremor. Speech is clear. Alert to self, disoriented to time and place. PSYCHIATRIC:  Affect and behavior are appropriate  Labs reviewed:  Recent Labs    12/17/18 0800 04/30/19  NA 139 144  K 3.4* 3.7  CL 104  --   CO2 27  --   GLUCOSE 98  --   BUN 25* 16  CREATININE 1.14* 0.7  CALCIUM 9.3  --    Recent Labs    12/17/18 0800 04/30/19  AST 17 12*  ALT 9 6*  ALKPHOS 77 88  BILITOT 0.6  --   PROT 7.3  --   ALBUMIN 4.4  --    Recent Labs    12/17/18 0800 04/30/19  WBC 8.9 6.2  NEUTROABS 5.7 4  HGB 12.9 13.4  HCT 39.0 39  MCV 93.5  --   PLT 271 261   Lab Results  Component Value Date   TSH 4.70 04/30/2019   Lab Results  Component Value Date   HGBA1C 5.4 03/15/2018   Lab Results  Component Value Date   CHOL 139 03/15/2018   HDL 46 03/15/2018   LDLCALC 74 03/15/2018   TRIG 97 03/15/2018     Assessment/Plan  1. Parkinson disease (HCC) -Stable, continue carbidopa-levodopa  2. Allergic rhinitis, unspecified seasonality, unspecified trigger -No sneezing noted, continue Claritin as needed and fluticasone nasal spray daily  3. GERD without esophagitis -Continue pantoprazole  4. Schizoaffective disorder, depressive type (HCC) -No inappropriate behavior has been observed nor reported, continue lithium carbonate  5. Anxiety -Mood is a stable, continue Ativan and buspirone,  followed up by psych NP  6. Vascular dementia without behavioral disturbance (HCC) -Continue donepezil, supportive care and fall precautions   Family/ staff Communication: Discussed plan of care with resident and charge nurse.  Labs/tests ordered: None  Goals of care:   Long-term care   Kenard Gower, DNP, FNP-BC [facility] and Adult Medicine [phone] (Monday-Friday 8:00 a.m. - 5:00 p.m.) [phone] (after hours)

## 2019-08-18 DIAGNOSIS — M6281 Muscle weakness (generalized): Secondary | ICD-10-CM | POA: Diagnosis not present

## 2019-08-18 DIAGNOSIS — K219 Gastro-esophageal reflux disease without esophagitis: Secondary | ICD-10-CM | POA: Diagnosis not present

## 2019-08-18 DIAGNOSIS — R1311 Dysphagia, oral phase: Secondary | ICD-10-CM | POA: Diagnosis not present

## 2019-08-18 DIAGNOSIS — G20 Parkinson's disease: Secondary | ICD-10-CM | POA: Diagnosis not present

## 2019-08-19 DIAGNOSIS — M6281 Muscle weakness (generalized): Secondary | ICD-10-CM | POA: Diagnosis not present

## 2019-08-19 DIAGNOSIS — G20 Parkinson's disease: Secondary | ICD-10-CM | POA: Diagnosis not present

## 2019-08-19 DIAGNOSIS — R1311 Dysphagia, oral phase: Secondary | ICD-10-CM | POA: Diagnosis not present

## 2019-08-19 DIAGNOSIS — K219 Gastro-esophageal reflux disease without esophagitis: Secondary | ICD-10-CM | POA: Diagnosis not present

## 2019-08-22 DIAGNOSIS — G20 Parkinson's disease: Secondary | ICD-10-CM | POA: Diagnosis not present

## 2019-08-22 DIAGNOSIS — K219 Gastro-esophageal reflux disease without esophagitis: Secondary | ICD-10-CM | POA: Diagnosis not present

## 2019-08-22 DIAGNOSIS — R1311 Dysphagia, oral phase: Secondary | ICD-10-CM | POA: Diagnosis not present

## 2019-08-22 DIAGNOSIS — M6281 Muscle weakness (generalized): Secondary | ICD-10-CM | POA: Diagnosis not present

## 2019-08-23 DIAGNOSIS — G20 Parkinson's disease: Secondary | ICD-10-CM | POA: Diagnosis not present

## 2019-08-23 DIAGNOSIS — M6281 Muscle weakness (generalized): Secondary | ICD-10-CM | POA: Diagnosis not present

## 2019-08-23 DIAGNOSIS — K219 Gastro-esophageal reflux disease without esophagitis: Secondary | ICD-10-CM | POA: Diagnosis not present

## 2019-08-23 DIAGNOSIS — R1311 Dysphagia, oral phase: Secondary | ICD-10-CM | POA: Diagnosis not present

## 2019-08-24 DIAGNOSIS — R1311 Dysphagia, oral phase: Secondary | ICD-10-CM | POA: Diagnosis not present

## 2019-08-24 DIAGNOSIS — M6281 Muscle weakness (generalized): Secondary | ICD-10-CM | POA: Diagnosis not present

## 2019-08-24 DIAGNOSIS — G20 Parkinson's disease: Secondary | ICD-10-CM | POA: Diagnosis not present

## 2019-08-24 DIAGNOSIS — K219 Gastro-esophageal reflux disease without esophagitis: Secondary | ICD-10-CM | POA: Diagnosis not present

## 2019-08-29 DIAGNOSIS — Z23 Encounter for immunization: Secondary | ICD-10-CM | POA: Diagnosis not present

## 2019-08-30 DIAGNOSIS — R1311 Dysphagia, oral phase: Secondary | ICD-10-CM | POA: Diagnosis not present

## 2019-08-30 DIAGNOSIS — K219 Gastro-esophageal reflux disease without esophagitis: Secondary | ICD-10-CM | POA: Diagnosis not present

## 2019-08-30 DIAGNOSIS — M6281 Muscle weakness (generalized): Secondary | ICD-10-CM | POA: Diagnosis not present

## 2019-08-30 DIAGNOSIS — G20 Parkinson's disease: Secondary | ICD-10-CM | POA: Diagnosis not present

## 2019-08-31 DIAGNOSIS — R1311 Dysphagia, oral phase: Secondary | ICD-10-CM | POA: Diagnosis not present

## 2019-08-31 DIAGNOSIS — K219 Gastro-esophageal reflux disease without esophagitis: Secondary | ICD-10-CM | POA: Diagnosis not present

## 2019-08-31 DIAGNOSIS — M6281 Muscle weakness (generalized): Secondary | ICD-10-CM | POA: Diagnosis not present

## 2019-08-31 DIAGNOSIS — G20 Parkinson's disease: Secondary | ICD-10-CM | POA: Diagnosis not present

## 2019-09-01 DIAGNOSIS — R1311 Dysphagia, oral phase: Secondary | ICD-10-CM | POA: Diagnosis not present

## 2019-09-01 DIAGNOSIS — K219 Gastro-esophageal reflux disease without esophagitis: Secondary | ICD-10-CM | POA: Diagnosis not present

## 2019-09-01 DIAGNOSIS — G20 Parkinson's disease: Secondary | ICD-10-CM | POA: Diagnosis not present

## 2019-09-01 DIAGNOSIS — M6281 Muscle weakness (generalized): Secondary | ICD-10-CM | POA: Diagnosis not present

## 2019-09-02 DIAGNOSIS — R1311 Dysphagia, oral phase: Secondary | ICD-10-CM | POA: Diagnosis not present

## 2019-09-02 DIAGNOSIS — K219 Gastro-esophageal reflux disease without esophagitis: Secondary | ICD-10-CM | POA: Diagnosis not present

## 2019-09-02 DIAGNOSIS — G20 Parkinson's disease: Secondary | ICD-10-CM | POA: Diagnosis not present

## 2019-09-02 DIAGNOSIS — M6281 Muscle weakness (generalized): Secondary | ICD-10-CM | POA: Diagnosis not present

## 2019-09-05 ENCOUNTER — Other Ambulatory Visit: Payer: Self-pay | Admitting: Adult Health

## 2019-09-05 DIAGNOSIS — R1311 Dysphagia, oral phase: Secondary | ICD-10-CM | POA: Diagnosis not present

## 2019-09-05 DIAGNOSIS — G20 Parkinson's disease: Secondary | ICD-10-CM | POA: Diagnosis not present

## 2019-09-05 DIAGNOSIS — K219 Gastro-esophageal reflux disease without esophagitis: Secondary | ICD-10-CM | POA: Diagnosis not present

## 2019-09-05 DIAGNOSIS — M6281 Muscle weakness (generalized): Secondary | ICD-10-CM | POA: Diagnosis not present

## 2019-09-05 MED ORDER — LORAZEPAM 1 MG PO TABS: 1.0000 mg | ORAL_TABLET | Freq: Two times a day (BID) | ORAL | 0 refills | Status: DC

## 2019-09-06 ENCOUNTER — Non-Acute Institutional Stay (SKILLED_NURSING_FACILITY): Payer: Medicare Other | Admitting: Adult Health

## 2019-09-06 ENCOUNTER — Encounter: Payer: Self-pay | Admitting: Adult Health

## 2019-09-06 DIAGNOSIS — M6281 Muscle weakness (generalized): Secondary | ICD-10-CM | POA: Diagnosis not present

## 2019-09-06 DIAGNOSIS — K219 Gastro-esophageal reflux disease without esophagitis: Secondary | ICD-10-CM

## 2019-09-06 DIAGNOSIS — F333 Major depressive disorder, recurrent, severe with psychotic symptoms: Secondary | ICD-10-CM

## 2019-09-06 DIAGNOSIS — J309 Allergic rhinitis, unspecified: Secondary | ICD-10-CM | POA: Diagnosis not present

## 2019-09-06 DIAGNOSIS — F251 Schizoaffective disorder, depressive type: Secondary | ICD-10-CM | POA: Diagnosis not present

## 2019-09-06 DIAGNOSIS — G20 Parkinson's disease: Secondary | ICD-10-CM | POA: Diagnosis not present

## 2019-09-06 DIAGNOSIS — J069 Acute upper respiratory infection, unspecified: Secondary | ICD-10-CM | POA: Diagnosis not present

## 2019-09-06 DIAGNOSIS — F0150 Vascular dementia without behavioral disturbance: Secondary | ICD-10-CM

## 2019-09-06 DIAGNOSIS — R1311 Dysphagia, oral phase: Secondary | ICD-10-CM | POA: Diagnosis not present

## 2019-09-06 NOTE — Progress Notes (Signed)
Location:  [facility] Room Number: 225/B Place of Service:  SNF (31) Provider:  Kenard Gower, DNP, FNP-BC  Patient Care Team: Pecola Lawless, MD as PCP - General (Internal Medicine) Center, [facility] (Skilled Nursing Facility) Synetta Shadow as Physician Assistant (Internal Medicine)  Extended Emergency Contact Information Primary Emergency Contact: [name] of [place] Home Phone: [phone] Relation: Daughter Secondary Emergency Contact: [name] [place] Home Phone: [phone] Relation: Daughter  Code Status: Full Code   Goals of care: Advanced Directive information Advanced Directives 09/06/2019  Does Patient Have a Medical Advance Directive? Yes  Type of Advance Directive (No Data)  Does patient want to make changes to medical advance directive? -  Would patient like information on creating a medical advance directive? -  Pre-existing out of facility DNR order (yellow form or pink MOST form) -     Chief Complaint  Patient presents with  . Medical Management of Chronic Issues    Routine visit of medical management    HPI:  Pt is a 73 y.o. female seen today for medical management of chronic diseases. She has PMH of Parkinson's Disease, fibromyalgia, dementia, TIA and GERD. She was seen in her room today. She is on restorative case load- eating/swallowing and AROM exercises. No reported behavior fpr the past month..   Past Medical History:  Diagnosis Date  . Allergy   . Anxiety   . Arthritis   . Asthma   . Benign essential hypertension 03/18/2017  . Cataract   . Dementia (HCC)   . Depression   . Difficulty walking   . Dysphagia, oral phase   . Fibromyalgia   . GERD (gastroesophageal reflux disease)   . Hallucinations   . Hyperlipidemia   . Hypertension   . Lower extremity edema   . Parkinson disease (HCC)   . Repeated falls   . Rhabdomyolysis   . Stroke (HCC)   .  TIA (transient ischemic attack)   . Urinary incontinence    Past Surgical History:  Procedure Laterality Date  . ABDOMINAL HYSTERECTOMY    . CATARACT EXTRACTION    . CHOLECYSTECTOMY    . TONSILLECTOMY AND ADENOIDECTOMY      Allergies  Allergen Reactions  . Hydrocodone Other (See Comments)    GI upset    Outpatient Encounter Medications as of 09/06/2019  Medication Sig  . acetaminophen (TYLENOL) 325 MG tablet Take 650 mg by mouth every 6 (six) hours as needed for mild pain or moderate pain.  Marland Kitchen albuterol (PROVENTIL HFA;VENTOLIN HFA) 108 (90 Base) MCG/ACT inhaler Inhale 2 puffs into the lungs every 6 (six) hours as needed for wheezing or shortness of breath.  Marland Kitchen aspirin EC 81 MG tablet Take 81 mg by mouth daily.  . bisacodyl (BISACODYL LAXATIVE) 10 MG suppository Place 10 mg rectally as needed for moderate constipation.  . busPIRone (BUSPAR) 15 MG tablet Take 15 mg by mouth 3 (three) times daily.  . calcium carbonate (TUMS - DOSED IN MG ELEMENTAL CALCIUM) 500 MG chewable tablet Chew 1 tablet by mouth every 6 (six) hours as needed for indigestion or heartburn.  . carbidopa-levodopa (SINEMET IR) 25-100 MG tablet Take 1 tablet by mouth 3 (three) times daily.  . cholecalciferol (VITAMIN D) 1000 units tablet Take 1,000 Units by mouth daily.   Marland Kitchen donepezil (ARICEPT) 10 MG tablet Take 10 mg by mouth at bedtime.   . DULoxetine (CYMBALTA) 30 MG capsule Give 3 tablets (90mg ) by  mouth daily  . fluticasone (FLONASE) 50 MCG/ACT nasal spray Place 2 sprays into both nostrils daily.   Marland Kitchen. lidocaine (LIDODERM) 5 % Place 2 patches onto the skin daily. Apply two patches tyo lower back daily for pain  . lithium carbonate 300 MG capsule Take 300 mg by mouth at bedtime.  Marland Kitchen. loperamide (IMODIUM) 2 MG capsule Take 2 mg by mouth every 6 (six) hours as needed for diarrhea or loose stools.  . Loratadine (CLARITIN REDITABS) 5 MG TBDP Take 10 mg by mouth daily as needed.   Marland Kitchen. LORazepam (ATIVAN) 1 MG tablet Take 1 tablet  (1 mg total) by mouth every 12 (twelve) hours.  . magnesium hydroxide (MILK OF MAGNESIA) 400 MG/5ML suspension If no BM in 3 days, give 30 cc Milk of Magnesium p.o. x 1 dose in 24 hours as needed (Do not use standing constipation orders for residents with renal failure CFR less than 30. Contact MD for orders) (Physician Order)  . memantine (NAMENDA) 10 MG tablet Take 10 mg by mouth 2 (two) times daily.   . NON FORMULARY Diet Regular with thin liquids  . Nutritional Supplement LIQD Take 120 mLs by mouth 2 (two) times a day. MedPass  . ondansetron (ZOFRAN) 4 MG tablet Take 4 mg by mouth every 6 (six) hours as needed for nausea or vomiting.  . pantoprazole (PROTONIX) 40 MG tablet Take 40 mg by mouth daily.   . polyethylene glycol (MIRALAX / GLYCOLAX) packet Take 17 g by mouth daily as needed.   . Sodium Phosphates (RA SALINE ENEMA RE) If not relieved by Biscodyl suppository, give disposable Saline Enema rectally X 1 dose/24 hrs as needed (Do not use constipation standing orders for residents with renal failure/CFR less than 30. Contact MD for orders)(Physician Or  . ziprasidone (GEODON) 80 MG capsule Take 80 mg by mouth 2 (two) times daily.    No facility-administered encounter medications on file as of 09/06/2019.     Review of Systems  Unable to obtain due to dementia   Immunization History  Administered Date(s) Administered  . Influenza-Unspecified 03/17/2017, 09/09/2017, 08/17/2018, 08/29/2019  . PPD Test 03/24/2017, 06/02/2018, 06/08/2018  . Pneumococcal Polysaccharide-23 03/17/2017, 06/12/2018   Pertinent  Health Maintenance Due  Topic Date Due  . URINE MICROALBUMIN  12/21/1955  . DEXA SCAN  12/21/2008  . PNA vac Low Risk Adult (2 of 2 - PCV13) 06/13/2019  . INFLUENZA VACCINE  Completed  . COLONOSCOPY  Discontinued   Fall Risk  05/27/2018 05/14/2017  Falls in the past year? No Yes  Number falls in past yr: - 2 or more  Injury with Fall? - No     Vitals:   09/06/19 1056  BP:  (!) 104/56  Pulse: (!) 58  Resp: 17  Temp: 98.5 F (36.9 C)  TempSrc: Oral  SpO2: 94%  Weight: 143 lb (64.9 kg)  Height: 5\' 5"  (1.651 m)   Body mass index is 23.8 kg/m.  Physical Exam  GENERAL APPEARANCE: Well nourished. In no acute distress. Normal body habitus SKIN:  Skin is warm and dry. MOUTH and THROAT: Lips are without lesions. Oral mucosa is moist and without lesions.  RESPIRATORY: Breathing is even & unlabored, BS CTAB CARDIAC: RRR, no murmur,no extra heart sounds, no edema GI: Abdomen soft, normal BS, no masses, no tenderness EXTREMITIES:  Able to move X 4 extremities NEUROLOGICAL: There is no tremor. Speech is clear. Alert to self, disoriented to time and place. PSYCHIATRIC:  Affect and behavior are appropriate  Labs reviewed: Recent Labs    12/17/18 0800 04/30/19  NA 139 144  K 3.4* 3.7  CL 104  --   CO2 27  --   GLUCOSE 98  --   BUN 25* 16  CREATININE 1.14* 0.7  CALCIUM 9.3  --    Recent Labs    12/17/18 0800 04/30/19  AST 17 12*  ALT 9 6*  ALKPHOS 77 88  BILITOT 0.6  --   PROT 7.3  --   ALBUMIN 4.4  --    Recent Labs    12/17/18 0800 04/30/19  WBC 8.9 6.2  NEUTROABS 5.7 4  HGB 12.9 13.4  HCT 39.0 39  MCV 93.5  --   PLT 271 261   Lab Results  Component Value Date   TSH 4.70 04/30/2019   Lab Results  Component Value Date   HGBA1C 5.4 03/15/2018   Lab Results  Component Value Date   CHOL 139 03/15/2018   HDL 46 03/15/2018   LDLCALC 74 03/15/2018   TRIG 97 03/15/2018     Assessment/Plan  1. Parkinson disease ([place]) - stable, continue Carbidopa-Levodopa  2. GERD without esophagitis - continue Pantoprazole  3. Allergic rhinitis, unspecified seasonality, unspecified trigger - no noted sneezing nor nasal drainage, continue Fluticasone and Claritine  4. Schizoaffective disorder, depressive type ([place]) - no behavior reported, continue Ativan, Buspirone and Lithium Carbonate  5. Severe episode of recurrent major depressive  disorder, with psychotic features ([place]) - verbalized not being depressed, continue Du;oxetine and Ziprazidone, followed by psych NP  6. Vascular dementia without behavioral disturbance ([place]) - continue Memantine and Donepezil, supportive care and fall precautions   Family/ staff Communication:  Discussed plan of care with resident.  Labs/tests ordered:  None  Goals of care:  Long-term care   Durenda Age, DNP, FNP-BC [facility] and Adult Medicine [phone] (Monday-Friday 8:00 a.m. - 5:00 p.m.) [phone] (after hours)

## 2019-09-07 DIAGNOSIS — K219 Gastro-esophageal reflux disease without esophagitis: Secondary | ICD-10-CM | POA: Diagnosis not present

## 2019-09-07 DIAGNOSIS — R1311 Dysphagia, oral phase: Secondary | ICD-10-CM | POA: Diagnosis not present

## 2019-09-07 DIAGNOSIS — M6281 Muscle weakness (generalized): Secondary | ICD-10-CM | POA: Diagnosis not present

## 2019-09-07 DIAGNOSIS — G20 Parkinson's disease: Secondary | ICD-10-CM | POA: Diagnosis not present

## 2019-09-08 DIAGNOSIS — G20 Parkinson's disease: Secondary | ICD-10-CM | POA: Diagnosis not present

## 2019-09-08 DIAGNOSIS — R1311 Dysphagia, oral phase: Secondary | ICD-10-CM | POA: Diagnosis not present

## 2019-09-08 DIAGNOSIS — K219 Gastro-esophageal reflux disease without esophagitis: Secondary | ICD-10-CM | POA: Diagnosis not present

## 2019-09-08 DIAGNOSIS — M6281 Muscle weakness (generalized): Secondary | ICD-10-CM | POA: Diagnosis not present

## 2019-09-09 DIAGNOSIS — G20 Parkinson's disease: Secondary | ICD-10-CM | POA: Diagnosis not present

## 2019-09-09 DIAGNOSIS — K219 Gastro-esophageal reflux disease without esophagitis: Secondary | ICD-10-CM | POA: Diagnosis not present

## 2019-09-09 DIAGNOSIS — M6281 Muscle weakness (generalized): Secondary | ICD-10-CM | POA: Diagnosis not present

## 2019-09-09 DIAGNOSIS — R1311 Dysphagia, oral phase: Secondary | ICD-10-CM | POA: Diagnosis not present

## 2019-09-12 DIAGNOSIS — K219 Gastro-esophageal reflux disease without esophagitis: Secondary | ICD-10-CM | POA: Diagnosis not present

## 2019-09-12 DIAGNOSIS — G20 Parkinson's disease: Secondary | ICD-10-CM | POA: Diagnosis not present

## 2019-09-12 DIAGNOSIS — M6281 Muscle weakness (generalized): Secondary | ICD-10-CM | POA: Diagnosis not present

## 2019-09-12 DIAGNOSIS — R1311 Dysphagia, oral phase: Secondary | ICD-10-CM | POA: Diagnosis not present

## 2019-09-13 DIAGNOSIS — J069 Acute upper respiratory infection, unspecified: Secondary | ICD-10-CM | POA: Diagnosis not present

## 2019-09-13 DIAGNOSIS — M6281 Muscle weakness (generalized): Secondary | ICD-10-CM | POA: Diagnosis not present

## 2019-09-13 DIAGNOSIS — R1311 Dysphagia, oral phase: Secondary | ICD-10-CM | POA: Diagnosis not present

## 2019-09-13 DIAGNOSIS — G20 Parkinson's disease: Secondary | ICD-10-CM | POA: Diagnosis not present

## 2019-09-13 DIAGNOSIS — K219 Gastro-esophageal reflux disease without esophagitis: Secondary | ICD-10-CM | POA: Diagnosis not present

## 2019-09-14 DIAGNOSIS — G20 Parkinson's disease: Secondary | ICD-10-CM | POA: Diagnosis not present

## 2019-09-14 DIAGNOSIS — R1311 Dysphagia, oral phase: Secondary | ICD-10-CM | POA: Diagnosis not present

## 2019-09-14 DIAGNOSIS — M6281 Muscle weakness (generalized): Secondary | ICD-10-CM | POA: Diagnosis not present

## 2019-09-14 DIAGNOSIS — K219 Gastro-esophageal reflux disease without esophagitis: Secondary | ICD-10-CM | POA: Diagnosis not present

## 2019-09-15 DIAGNOSIS — G20 Parkinson's disease: Secondary | ICD-10-CM | POA: Diagnosis not present

## 2019-09-15 DIAGNOSIS — R1311 Dysphagia, oral phase: Secondary | ICD-10-CM | POA: Diagnosis not present

## 2019-09-15 DIAGNOSIS — K219 Gastro-esophageal reflux disease without esophagitis: Secondary | ICD-10-CM | POA: Diagnosis not present

## 2019-09-15 DIAGNOSIS — M6281 Muscle weakness (generalized): Secondary | ICD-10-CM | POA: Diagnosis not present

## 2019-09-16 DIAGNOSIS — M6281 Muscle weakness (generalized): Secondary | ICD-10-CM | POA: Diagnosis not present

## 2019-09-16 DIAGNOSIS — R1311 Dysphagia, oral phase: Secondary | ICD-10-CM | POA: Diagnosis not present

## 2019-09-16 DIAGNOSIS — G20 Parkinson's disease: Secondary | ICD-10-CM | POA: Diagnosis not present

## 2019-09-16 DIAGNOSIS — K219 Gastro-esophageal reflux disease without esophagitis: Secondary | ICD-10-CM | POA: Diagnosis not present

## 2019-09-19 DIAGNOSIS — R1311 Dysphagia, oral phase: Secondary | ICD-10-CM | POA: Diagnosis not present

## 2019-09-19 DIAGNOSIS — K219 Gastro-esophageal reflux disease without esophagitis: Secondary | ICD-10-CM | POA: Diagnosis not present

## 2019-09-20 DIAGNOSIS — B351 Tinea unguium: Secondary | ICD-10-CM | POA: Diagnosis not present

## 2019-09-20 DIAGNOSIS — K219 Gastro-esophageal reflux disease without esophagitis: Secondary | ICD-10-CM | POA: Diagnosis not present

## 2019-09-20 DIAGNOSIS — L603 Nail dystrophy: Secondary | ICD-10-CM | POA: Diagnosis not present

## 2019-09-20 DIAGNOSIS — R1311 Dysphagia, oral phase: Secondary | ICD-10-CM | POA: Diagnosis not present

## 2019-09-20 DIAGNOSIS — I739 Peripheral vascular disease, unspecified: Secondary | ICD-10-CM | POA: Diagnosis not present

## 2019-09-21 DIAGNOSIS — K219 Gastro-esophageal reflux disease without esophagitis: Secondary | ICD-10-CM | POA: Diagnosis not present

## 2019-09-21 DIAGNOSIS — R1311 Dysphagia, oral phase: Secondary | ICD-10-CM | POA: Diagnosis not present

## 2019-09-23 DIAGNOSIS — K219 Gastro-esophageal reflux disease without esophagitis: Secondary | ICD-10-CM | POA: Diagnosis not present

## 2019-09-23 DIAGNOSIS — R1311 Dysphagia, oral phase: Secondary | ICD-10-CM | POA: Diagnosis not present

## 2019-09-24 DIAGNOSIS — K219 Gastro-esophageal reflux disease without esophagitis: Secondary | ICD-10-CM | POA: Diagnosis not present

## 2019-09-24 DIAGNOSIS — R1311 Dysphagia, oral phase: Secondary | ICD-10-CM | POA: Diagnosis not present

## 2019-09-26 DIAGNOSIS — R1311 Dysphagia, oral phase: Secondary | ICD-10-CM | POA: Diagnosis not present

## 2019-09-26 DIAGNOSIS — K219 Gastro-esophageal reflux disease without esophagitis: Secondary | ICD-10-CM | POA: Diagnosis not present

## 2019-09-27 DIAGNOSIS — K219 Gastro-esophageal reflux disease without esophagitis: Secondary | ICD-10-CM | POA: Diagnosis not present

## 2019-09-27 DIAGNOSIS — R1311 Dysphagia, oral phase: Secondary | ICD-10-CM | POA: Diagnosis not present

## 2019-09-28 DIAGNOSIS — K219 Gastro-esophageal reflux disease without esophagitis: Secondary | ICD-10-CM | POA: Diagnosis not present

## 2019-09-28 DIAGNOSIS — R1311 Dysphagia, oral phase: Secondary | ICD-10-CM | POA: Diagnosis not present

## 2019-09-29 DIAGNOSIS — R1311 Dysphagia, oral phase: Secondary | ICD-10-CM | POA: Diagnosis not present

## 2019-09-29 DIAGNOSIS — K219 Gastro-esophageal reflux disease without esophagitis: Secondary | ICD-10-CM | POA: Diagnosis not present

## 2019-09-30 DIAGNOSIS — R1311 Dysphagia, oral phase: Secondary | ICD-10-CM | POA: Diagnosis not present

## 2019-09-30 DIAGNOSIS — K219 Gastro-esophageal reflux disease without esophagitis: Secondary | ICD-10-CM | POA: Diagnosis not present

## 2019-10-03 DIAGNOSIS — R1311 Dysphagia, oral phase: Secondary | ICD-10-CM | POA: Diagnosis not present

## 2019-10-03 DIAGNOSIS — K219 Gastro-esophageal reflux disease without esophagitis: Secondary | ICD-10-CM | POA: Diagnosis not present

## 2019-10-04 ENCOUNTER — Encounter: Payer: Self-pay | Admitting: Internal Medicine

## 2019-10-04 ENCOUNTER — Non-Acute Institutional Stay (SKILLED_NURSING_FACILITY): Payer: Medicare Other | Admitting: Internal Medicine

## 2019-10-04 ENCOUNTER — Other Ambulatory Visit: Payer: Self-pay | Admitting: Adult Health

## 2019-10-04 DIAGNOSIS — F251 Schizoaffective disorder, depressive type: Secondary | ICD-10-CM

## 2019-10-04 DIAGNOSIS — G20 Parkinson's disease: Secondary | ICD-10-CM

## 2019-10-04 DIAGNOSIS — F0150 Vascular dementia without behavioral disturbance: Secondary | ICD-10-CM

## 2019-10-04 DIAGNOSIS — I10 Essential (primary) hypertension: Secondary | ICD-10-CM

## 2019-10-04 MED ORDER — LORAZEPAM 1 MG PO TABS: 1.0000 mg | ORAL_TABLET | Freq: Two times a day (BID) | ORAL | 0 refills | Status: AC

## 2019-10-04 NOTE — Assessment & Plan Note (Signed)
Staff does not report significant behavioral issues at this time.

## 2019-10-04 NOTE — Assessment & Plan Note (Addendum)
Follow-up with Dr. Krista Blue is appropriate to discern impact of components of parkinsonism & bipolar disorder.  Today facies are masked.  She does not exhibit the classic parkinsonian hand tremor.

## 2019-10-04 NOTE — Assessment & Plan Note (Addendum)
Because of the comorbidities of schizoaffective disorder as well as Parkinson's; subspecialty input by Neurology and Psychiatry are necessary to optimize the psychotropic medications.

## 2019-10-04 NOTE — Progress Notes (Signed)
   NURSING HOME LOCATION:  [facility] ROOM NUMBER:  225-B  CODE STATUS:  Full Code  PCP:  Hendricks Limes, MD  [place] [address]  This is a nursing facility follow up of chronic medical diagnoses.  Interim medical record and care since last [place] visit was updated with review of diagnostic studies and change in clinical status since last visit were documented.  HPI: She is a permanent resident of facility with medical diagnoses of history of TIA as well as stroke; rhabdomyolysis; recurrent falls; Parkinson's disease; essential hypertension; dyslipidemia; fibromyalgia; vascular dementia; schizoaffective disorder with hallucinations; and GERD. She has had extensive surgeries including abdominal hysterectomy and cholecystectomy.  Review of systems: Dementia invalidated responses. Date given as October 2020.  She named the president as "the new one".  She did know that Trump was "the old one".  Her major concern is she states she has not had her hair washed or shower for 2 months.  She was unaware of the pandemic quarantine.  Physical exam:  Pertinent or positive findings: When I arrived at the bedside she was asleep despite the fact it was after 2:30 in the afternoon.  She was arousable but affect was profoundly flat.  Facies were masked.  There was essentially no eye contact as she tended to look at the wall.  Dorsalis pedis pulses are stronger than the posterior tibial pulses.  The right great toenail is thickened and deformed.  She is weak to opposition in all extremities, especially the lower extremities.  She has slight myoclonus of the right upper extremity.  There is also slight myoclonic activity of the feet with stimulus.  General appearance: Adequately nourished; no acute distress, increased work of breathing is present.   Lymphatic: No lymphadenopathy about the head, neck, axilla. Eyes: No conjunctival inflammation or lid edema is  present. There is no scleral icterus. Ears:  External ear exam shows no significant lesions or deformities.   Nose:  External nasal examination shows no deformity or inflammation. Nasal mucosa are pink and moist without lesions, exudates Oral exam:  Lips and gums are healthy appearing. There is no oropharyngeal erythema or exudate. Neck:  No thyromegaly, masses, tenderness noted.  Heart:  Normal rate and regular rhythm. S1 and S2 normal without gallop, murmur, click, rub .  Lungs: Chest clear to auscultation without wheezes, rhonchi, rales, rubs. Abdomen: Bowel sounds are normal. Abdomen is soft and nontender with no organomegaly, hernias, masses. GU: Deferred  Extremities:  No cyanosis, clubbing, edema  Neurologic exam : Balance, Rhomberg, finger to nose testing could not be completed due to clinical state Skin: Warm & dry w/o tenting. No significant lesions or rash.  See summary under each active problem in the Problem List with associated updated therapeutic plan

## 2019-10-04 NOTE — Assessment & Plan Note (Signed)
BP controlled; no change in antihypertensive medications  

## 2019-10-06 NOTE — Patient Instructions (Signed)
See assessment and plan under each diagnosis in the problem list and acutely for this visit 

## 2019-10-07 ENCOUNTER — Encounter: Payer: Self-pay | Admitting: Adult Health

## 2019-10-07 ENCOUNTER — Non-Acute Institutional Stay (SKILLED_NURSING_FACILITY): Payer: Medicare Other | Admitting: Adult Health

## 2019-10-07 DIAGNOSIS — G20 Parkinson's disease: Secondary | ICD-10-CM | POA: Diagnosis not present

## 2019-10-07 DIAGNOSIS — F251 Schizoaffective disorder, depressive type: Secondary | ICD-10-CM | POA: Diagnosis not present

## 2019-10-07 DIAGNOSIS — K1379 Other lesions of oral mucosa: Secondary | ICD-10-CM | POA: Diagnosis not present

## 2019-10-07 NOTE — Progress Notes (Signed)
Location:  [place] Room Number: 225-B Place of Service:  SNF (31) Provider:  Durenda Age, DNP, FNP-BC  Patient Care Team: Hendricks Limes, MD as PCP - General (Internal Medicine) Center, [place] (Centre) Rolm Baptise as Physician Assistant (Internal Medicine)  Extended Emergency Contact Information Primary Emergency Contact: Vivia [place] of [place] Phone: [phone] Relation: Daughter Secondary Emergency Contact: Merleen Milliner [place] of [place] Phone: [phone] Relation: Daughter  Code Status:  FULL CODE  Goals of care: Advanced Directive information Advanced Directives 09/06/2019  Does Patient Have a Medical Advance Directive? Yes  Type of Advance Directive (No Data)  Does patient want to make changes to medical advance directive? -  Would patient like information on creating a medical advance directive? -  Pre-existing out of facility DNR order (yellow form or pink MOST form) -     Chief Complaint  Patient presents with  . Acute Visit    Patient is seen for oral pain.     HPI:  Pt is a 73 y.o. female seen today for oral pain. She is a long-term care resident of [facility]. She has a PMH of anxiety, hypertension, dementia, fibromyalgia, GERD, HLD, and Parkinson disease. She complained of oral pain. Noted decayed teeth, molar. She verbalized that it hurts only when she eats. Daughter had a window visit with the resident. She requested for resident to consult with neurology for her Parkinson's disease. Resident feeds self in her room. Her diet is regular finger foods with thin liquids consistency. Latest weight is 140.8 lbs Body mass index is 23.43 kg/m.   Past Medical History:  Diagnosis Date  . Allergy   . Anxiety   . Arthritis   . Asthma   . Benign essential hypertension 03/18/2017  . Cataract   . Dementia ([place])   . Depression   .  Difficulty walking   . Dysphagia, oral phase   . Fibromyalgia   . GERD (gastroesophageal reflux disease)   . Hallucinations   . Hyperlipidemia   . Lower extremity edema   . Parkinson disease ([place])   . Repeated falls   . Rhabdomyolysis   . Stroke ([place])   . TIA (transient ischemic attack)   . Urinary incontinence    Past Surgical History:  Procedure Laterality Date  . ABDOMINAL HYSTERECTOMY    . CATARACT EXTRACTION    . CHOLECYSTECTOMY    . TONSILLECTOMY AND ADENOIDECTOMY      Allergies  Allergen Reactions  . Hydrocodone Other (See Comments)    GI upset    Outpatient Encounter Medications as of 10/07/2019  Medication Sig  . acetaminophen (TYLENOL) 325 MG tablet Take 650 mg by mouth every 6 (six) hours as needed for mild pain or moderate pain.  Marland Kitchen albuterol (PROVENTIL HFA;VENTOLIN HFA) 108 (90 Base) MCG/ACT inhaler Inhale 2 puffs into the lungs every 6 (six) hours as needed for wheezing or shortness of breath.  Marland Kitchen aspirin EC 81 MG tablet Take 81 mg by mouth daily.  . bisacodyl (BISACODYL LAXATIVE) 10 MG suppository Place 10 mg rectally as needed for moderate constipation.  . busPIRone (BUSPAR) 15 MG tablet Take 15 mg by mouth 3 (three) times daily.  . calcium carbonate (TUMS - DOSED IN MG ELEMENTAL CALCIUM) 500 MG chewable tablet Chew 1 tablet by mouth every 6 (six) hours as needed for indigestion or heartburn.  . carbidopa-levodopa (SINEMET IR) 25-100 MG tablet Take 1 tablet by  mouth 3 (three) times daily.  . cholecalciferol (VITAMIN D) 1000 units tablet Take 1,000 Units by mouth daily.   Marland Kitchen. donepezil (ARICEPT) 10 MG tablet Take 10 mg by mouth at bedtime.   . DULoxetine (CYMBALTA) 30 MG capsule Give 3 tablets (90mg ) by mouth daily  . fluticasone (FLONASE) 50 MCG/ACT nasal spray Place 2 sprays into both nostrils daily.   Marland Kitchen. lidocaine (LIDODERM) 5 % Place 2 patches onto the skin daily. Apply two patches tyo lower back daily for pain  . lithium carbonate 300 MG capsule Take 300 mg by  mouth at bedtime.  Marland Kitchen. loperamide (IMODIUM) 2 MG capsule Take 2 mg by mouth every 6 (six) hours as needed for diarrhea or loose stools.  . Loratadine (CLARITIN REDITABS) 5 MG TBDP Take 10 mg by mouth daily as needed.   Marland Kitchen. LORazepam (ATIVAN) 1 MG tablet Take 1 tablet (1 mg total) by mouth every 12 (twelve) hours.  . magnesium hydroxide (MILK OF MAGNESIA) 400 MG/5ML suspension If no BM in 3 days, give 30 cc Milk of Magnesium p.o. x 1 dose in 24 hours as needed (Do not use standing constipation orders for residents with renal failure CFR less than 30. Contact MD for orders) (Physician Order)  . memantine (NAMENDA) 10 MG tablet Take 10 mg by mouth 2 (two) times daily.   . NON FORMULARY Diet Regular with thin liquids  . Nutritional Supplement LIQD Take 120 mLs by mouth 2 (two) times a day. MedPass  . ondansetron (ZOFRAN) 4 MG tablet Take 4 mg by mouth every 6 (six) hours as needed for nausea or vomiting.  . pantoprazole (PROTONIX) 40 MG tablet Take 40 mg by mouth daily.   . polyethylene glycol (MIRALAX / GLYCOLAX) packet Take 17 g by mouth daily as needed.   . Sodium Phosphates (RA SALINE ENEMA RE) If not relieved by Biscodyl suppository, give disposable Saline Enema rectally X 1 dose/24 hrs as needed (Do not use constipation standing orders for residents with renal failure/CFR less than 30. Contact MD for orders)(Physician Or  . ziprasidone (GEODON) 80 MG capsule Take 80 mg by mouth 2 (two) times daily.    No facility-administered encounter medications on file as of 10/07/2019.     Review of Systems  GENERAL: No change in appetite, no fatigue, no weight changes, no fever, chills or weakness MOUTH and THROAT: +pain from teeth    RESPIRATORY: no cough, SOB, DOE, wheezing, hemoptysis CARDIAC: No chest pain, edema or palpitations GI: No abdominal pain, diarrhea, constipation, heart burn, nausea or vomiting GU: Denies dysuria, frequency, hematuria, incontinence, or discharge NEUROLOGICAL: Denies  dizziness, syncope, numbness, or headache PSYCHIATRIC: Denies feelings of depression or anxiety. No report of hallucinations, insomnia, paranoia, or agitation   Immunization History  Administered Date(s) Administered  . Influenza-Unspecified 03/17/2017, 09/09/2017, 08/17/2018, 08/29/2019  . PPD Test 03/24/2017, 06/02/2018, 06/08/2018  . Pneumococcal Polysaccharide-23 03/17/2017, 06/12/2018   Pertinent  Health Maintenance Due  Topic Date Due  . URINE MICROALBUMIN  12/21/1955  . DEXA SCAN  12/21/2008  . PNA vac Low Risk Adult (2 of 2 - PCV13) 06/13/2019  . INFLUENZA VACCINE  Completed  . COLONOSCOPY  Discontinued   Fall Risk  05/27/2018 05/14/2017  Falls in the past year? No Yes  Number falls in past yr: - 2 or more  Injury with Fall? - No     Vitals:   10/07/19 1528  BP: (!) 105/53  Pulse: 72  Resp: 18  Temp: (!) 97.4 F (36.3 C)  TempSrc: Oral  Weight: 140 lb 12.8 oz (63.9 kg)  Height: 5\' 5"  (1.651 m)   Body mass index is 23.43 kg/m.  Physical Exam  GENERAL APPEARANCE: Well nourished. In no acute distress. Normal body habitus SKIN:  Skin is warm and dry.  MOUTH and THROAT: Lips are without lesions. Oral mucosa is moist and without lesions. Has decayed molars RESPIRATORY: Breathing is even & unlabored, BS CTAB CARDIAC: RRR, no murmur,no extra heart sounds, no edema GI: Abdomen soft, normal BS, no masses, no tenderness NEUROLOGICAL: There is no tremor. Speech is clear.  PSYCHIATRIC:  Flat Affect and behavior are appropriate  Labs reviewed: Recent Labs    12/17/18 0800 04/30/19  NA 139 144  K 3.4* 3.7  CL 104  --   CO2 27  --   GLUCOSE 98  --   BUN 25* 16  CREATININE 1.14* 0.7  CALCIUM 9.3  --    Recent Labs    12/17/18 0800 04/30/19  AST 17 12*  ALT 9 6*  ALKPHOS 77 88  BILITOT 0.6  --   PROT 7.3  --   ALBUMIN 4.4  --    Recent Labs    12/17/18 0800 04/30/19  WBC 8.9 6.2  NEUTROABS 5.7 4  HGB 12.9 13.4  HCT 39.0 39  MCV 93.5  --   PLT 271  261   Lab Results  Component Value Date   TSH 4.70 04/30/2019   Lab Results  Component Value Date   HGBA1C 5.4 03/15/2018   Lab Results  Component Value Date   CHOL 139 03/15/2018   HDL 46 03/15/2018   LDLCALC 74 03/15/2018   TRIG 97 03/15/2018    Assessment/Plan  1. Oral pain -was a started on Magic mouthwash 10 mL swab to gumline 4 times a day x7 days, oral care daily, dental consult  2. Parkinson disease (HCC) -Continue carbidopa-levodopa 25-101 tab 3 times a day, neurology consult   3. Schizoaffective disorder, depressive type (HCC) -Continue duloxetine, lithium carbonate and  Ziprasidone, followed up by psych NP    Family/ staff Communication: Discussed plan of care with resident and charge nurse.  Labs/tests ordered: None  Goals of care:  Long-term care   03/17/2018, DNP, FNP-BC [facility] [phone] (Monday-Friday 8:00 a.m. - 5:00 p.m.) [phone] (after hours)

## 2019-10-31 ENCOUNTER — Encounter: Payer: Self-pay | Admitting: Adult Health

## 2019-10-31 ENCOUNTER — Non-Acute Institutional Stay (SKILLED_NURSING_FACILITY): Payer: Medicare Other | Admitting: Adult Health

## 2019-10-31 DIAGNOSIS — J309 Allergic rhinitis, unspecified: Secondary | ICD-10-CM | POA: Diagnosis not present

## 2019-10-31 DIAGNOSIS — K219 Gastro-esophageal reflux disease without esophagitis: Secondary | ICD-10-CM | POA: Diagnosis not present

## 2019-10-31 DIAGNOSIS — Z23 Encounter for immunization: Secondary | ICD-10-CM

## 2019-10-31 DIAGNOSIS — F251 Schizoaffective disorder, depressive type: Secondary | ICD-10-CM | POA: Diagnosis not present

## 2019-10-31 DIAGNOSIS — F0150 Vascular dementia without behavioral disturbance: Secondary | ICD-10-CM

## 2019-10-31 DIAGNOSIS — G20 Parkinson's disease: Secondary | ICD-10-CM | POA: Diagnosis not present

## 2019-10-31 NOTE — Progress Notes (Signed)
Location:  [place] Room Number: 225/B Place of Service:  SNF (31) Provider:  Durenda Age, DNP, FNP-BC  Patient Care Team: Hendricks Limes, MD as PCP - General (Internal Medicine) Center, [place] ([place]) Rolm Baptise as Physician Assistant (Internal Medicine)  Extended Emergency Contact Information Primary Emergency Contact: Vivia Birmingham of [place] Phone: [phone] Relation: Daughter Secondary Emergency Contact: Merleen Milliner States of [place] Phone: [phone] Relation: Daughter  Code Status: Full Code  Goals of care: Advanced Directive information Advanced Directives 11/02/2019  Does Patient Have a Medical Advance Directive? Yes  Type of Advance Directive (No Data)  Does patient want to make changes to medical advance directive? No - Patient declined  Would patient like information on creating a medical advance directive? -  Pre-existing out of facility DNR order (yellow form or pink MOST form) -     Chief Complaint  Patient presents with  . Medical Management of Chronic Issues    Routine Visit  . Immunizations    PCV-13    HPI:  Pt is a 73 y.o. female seen today for medical management of chronic diseases.  She has PMH of anxiety, hypertension, dementia, fibromyalgia, GERD, hyperlipidemia and Parkinson's disease. She was recently treated with magic mouthwash for oral pain. Today, she denies pain in her mouth. She participates in restorative eating/swallowing wherein she is provided cueing for increased consumption and feeding assistance.  She has weighted cup with meals while sitting upright in wheelchair.  No nasal discharge nor sneezing noted.  She takes fluticasone nasal spray daily for allergic rhinitis.   Past Medical History:  Diagnosis Date  . Allergy   . Anxiety   . Arthritis   . Asthma   . Benign essential hypertension 03/18/2017  . Cataract   .  Dementia ([place])   . Depression   . Difficulty walking   . Dysphagia, oral phase   . Fibromyalgia   . GERD (gastroesophageal reflux disease)   . Hallucinations   . Hyperlipidemia   . Lower extremity edema   . Parkinson disease ([place])   . Repeated falls   . Rhabdomyolysis   . Stroke ([place])   . TIA (transient ischemic attack)   . Urinary incontinence    Past Surgical History:  Procedure Laterality Date  . ABDOMINAL HYSTERECTOMY    . CATARACT EXTRACTION    . CHOLECYSTECTOMY    . TONSILLECTOMY AND ADENOIDECTOMY      Allergies  Allergen Reactions  . Hydrocodone Other (See Comments)    GI upset    Outpatient Encounter Medications as of 10/31/2019  Medication Sig  . acetaminophen (TYLENOL) 325 MG tablet Take 650 mg by mouth every 6 (six) hours as needed for mild pain or moderate pain.  Marland Kitchen albuterol (PROVENTIL HFA;VENTOLIN HFA) 108 (90 Base) MCG/ACT inhaler Inhale 2 puffs into the lungs every 6 (six) hours as needed for wheezing or shortness of breath.  Marland Kitchen aspirin EC 81 MG tablet Take 81 mg by mouth daily.  . bisacodyl (BISACODYL LAXATIVE) 10 MG suppository Place 10 mg rectally as needed for moderate constipation.  . busPIRone (BUSPAR) 15 MG tablet Take 15 mg by mouth 3 (three) times daily.  . calcium carbonate (TUMS - DOSED IN MG ELEMENTAL CALCIUM) 500 MG chewable tablet Chew 1 tablet by mouth every 6 (six) hours as needed for indigestion or heartburn.  . carbidopa-levodopa (SINEMET IR) 25-100 MG tablet Take 1 tablet by mouth 3 (three) times  daily.  . cholecalciferol (VITAMIN D) 1000 units tablet Take 1,000 Units by mouth daily.   Marland Kitchen. donepezil (ARICEPT) 10 MG tablet Take 10 mg by mouth at bedtime.   . DULoxetine (CYMBALTA) 30 MG capsule Give 3 tablets (90mg ) by mouth daily  . fluticasone (FLONASE) 50 MCG/ACT nasal spray Place 2 sprays into both nostrils daily.   . Lidocaine (SALONPAS PAIN RELIEVING) 4 % PTCH APPLY 2 PATCHES TOPICALLY TO LOWER BACK ONCE DAILY FOR PAIN (REMOVE AT BEDTIME)    . lithium carbonate 300 MG capsule Take 300 mg by mouth at bedtime.  Marland Kitchen. loperamide (IMODIUM) 2 MG capsule Take 2 mg by mouth every 6 (six) hours as needed for diarrhea or loose stools.  . Loratadine (CLARITIN REDITABS) 5 MG TBDP Take 10 mg by mouth daily as needed.   Marland Kitchen. LORazepam (ATIVAN) 1 MG tablet Take 1 tablet (1 mg total) by mouth every 12 (twelve) hours.  . magnesium hydroxide (MILK OF MAGNESIA) 400 MG/5ML suspension If no BM in 3 days, give 30 cc Milk of Magnesium p.o. x 1 dose in 24 hours as needed (Do not use standing constipation orders for residents with renal failure CFR less than 30. Contact MD for orders) (Physician Order)  . memantine (NAMENDA) 10 MG tablet Take 10 mg by mouth 2 (two) times daily.   . NON FORMULARY Regular finger foods/thin consistency  . Nutritional Supplement LIQD Take 120 mLs by mouth 2 (two) times a day. MedPass  . ondansetron (ZOFRAN) 4 MG tablet Take 4 mg by mouth every 6 (six) hours as needed for nausea or vomiting.  . pantoprazole (PROTONIX) 40 MG tablet Take 40 mg by mouth daily.   . polyethylene glycol (MIRALAX / GLYCOLAX) packet Take 17 g by mouth daily as needed.   . Sodium Phosphates (RA SALINE ENEMA RE) If not relieved by Biscodyl suppository, give disposable Saline Enema rectally X 1 dose/24 hrs as needed (Do not use constipation standing orders for residents with renal failure/CFR less than 30. Contact MD for orders)(Physician Or  . ziprasidone (GEODON) 80 MG capsule Take 80 mg by mouth 2 (two) times daily.   . [DISCONTINUED] lidocaine (LIDODERM) 5 % Place 2 patches onto the skin daily. Apply two patches tyo lower back daily for pain   No facility-administered encounter medications on file as of 10/31/2019.    Review of Systems  GENERAL: No change in appetite, no fatigue, no weight changes, no fever, chills or weakness MOUTH and THROAT: Denies oral discomfort, gingival pain RESPIRATORY: no cough, SOB, DOE, wheezing, hemoptysis CARDIAC: No chest  pain, edema or palpitations GI: No abdominal pain, diarrhea, constipation, heart burn, nausea or vomiting GU: Denies dysuria, frequency, hematuria or discharge NEUROLOGICAL: Denies dizziness, syncope, numbness, or headache PSYCHIATRIC: Denies feelings of depression or anxiety. No report of hallucinations, insomnia, paranoia, or agitation   Immunization History  Administered Date(s) Administered  . Influenza-Unspecified 03/17/2017, 09/09/2017, 08/17/2018, 08/29/2019  . PPD Test 03/24/2017, 06/02/2018, 06/08/2018  . Pneumococcal Polysaccharide-23 03/17/2017, 06/12/2018   Pertinent  Health Maintenance Due  Topic Date Due  . DEXA SCAN  12/21/2008  . PNA vac Low Risk Adult (2 of 2 - PCV13) 06/13/2019  . INFLUENZA VACCINE  Completed  . COLONOSCOPY  Discontinued   Fall Risk  05/27/2018 05/14/2017  Falls in the past year? No Yes  Number falls in past yr: - 2 or more  Injury with Fall? - No     Vitals:   10/31/19 1019  BP: 118/69  Pulse: 73  Resp: 18  Temp: 97.9 F (36.6 C)  TempSrc: Oral  SpO2: 94%  Weight: 137 lb 12.8 oz (62.5 kg)  Height: 5\' 5"  (1.651 m)   Body mass index is 22.93 kg/m.  Physical Exam  GENERAL APPEARANCE: Well nourished. In no acute distress. Normal body habitus SKIN:  Skin is warm and dry.  MOUTH and THROAT: Lips are without lesions. Oral mucosa is moist and without lesions. Tongue is normal in shape, size, and color and without lesions RESPIRATORY: Breathing is even & unlabored, BS CTAB CARDIAC: RRR, no murmur,no extra heart sounds, no edema GI: Abdomen soft, normal BS, no masses, no tenderness EXTREMITIES:  Able to move X 4 extremities NEUROLOGICAL: + tremor. Speech is clear. Alert to self, disoriented to time and place. PSYCHIATRIC:  Affect and behavior are appropriate  Labs reviewed: Recent Labs    12/17/18 0800 04/30/19 0000  NA 139 144  K 3.4* 3.7  CL 104  --   CO2 27  --   GLUCOSE 98  --   BUN 25* 16  CREATININE 1.14* 0.7  CALCIUM 9.3   --    Recent Labs    12/17/18 0800 04/30/19 0000  AST 17 12*  ALT 9 6*  ALKPHOS 77 88  BILITOT 0.6  --   PROT 7.3  --   ALBUMIN 4.4  --    Recent Labs    12/17/18 0800 04/30/19 0000  WBC 8.9 6.2  NEUTROABS 5.7 4  HGB 12.9 13.4  HCT 39.0 39  MCV 93.5  --   PLT 271 261   Lab Results  Component Value Date   TSH 4.70 04/30/2019   Lab Results  Component Value Date   HGBA1C 5.4 03/15/2018   Lab Results  Component Value Date   CHOL 139 03/15/2018   HDL 46 03/15/2018   LDLCALC 74 03/15/2018   TRIG 97 03/15/2018     Assessment/Plan  1. GERD without esophagitis -Stable, continue pantoprazole 40 mg daily  2. Allergic rhinitis, unspecified seasonality, unspecified trigger -No nasal drainage, continue fluticasone nasal spray  3. Parkinson disease (HCC) -Has tremors, continue carbidopa-levodopa 25-100 tab 3 times a day, referred to neurology for consult, uses weighted cup during meals and currently on restorative eating/swallowing  4. Schizoaffective disorder, depressive type (HCC) -Mood is stable, continue lithium carbonate, ziprasidone, Ativan, duloxetine and buspirone  5. Vascular dementia without behavioral disturbance (HCC) -Continue donepezil and memantine, supportive care and fall precautions  6. Need for vaccination against Streptococcus pneumococcal conjugate vaccine 13 -Ordered PCV 13 0.5 mL IM x1, monitor for reaction such as rashes pain on site of injection      Family/ staff Communication:  Discussed plan of care with resident and charge nurse.  Labs/tests ordered:  None  Goals of care: Long-term care     03/17/2018, DNP, FNP-BC [facility] [phone] (Monday-Friday 8:00 a.m. - 5:00 p.m.) [phone] (after hours)

## 2019-11-02 ENCOUNTER — Non-Acute Institutional Stay (SKILLED_NURSING_FACILITY): Payer: Medicare Other | Admitting: Adult Health

## 2019-11-02 ENCOUNTER — Encounter: Payer: Self-pay | Admitting: Adult Health

## 2019-11-02 DIAGNOSIS — F251 Schizoaffective disorder, depressive type: Secondary | ICD-10-CM | POA: Diagnosis not present

## 2019-11-02 DIAGNOSIS — K219 Gastro-esophageal reflux disease without esophagitis: Secondary | ICD-10-CM

## 2019-11-02 DIAGNOSIS — G20 Parkinson's disease: Secondary | ICD-10-CM | POA: Diagnosis not present

## 2019-11-02 DIAGNOSIS — Z7189 Other specified counseling: Secondary | ICD-10-CM | POA: Diagnosis not present

## 2019-11-02 DIAGNOSIS — F0150 Vascular dementia without behavioral disturbance: Secondary | ICD-10-CM

## 2019-11-02 DIAGNOSIS — Z23 Encounter for immunization: Secondary | ICD-10-CM | POA: Insufficient documentation

## 2019-11-02 NOTE — Progress Notes (Signed)
Location:  [facility] Room Number: 225/B Place of Service:  SNF (31) Provider:  Durenda Age, DNP, FNP-BC  Patient Care Team: Hendricks Limes, MD as PCP - General (Internal Medicine) [facility], [facility] Rolm Baptise as Physician Assistant (Internal Medicine)  Extended Emergency Contact Information Primary Emergency Contact: Vivia [place] of [place] Phone: [phone] Relation: Daughter Secondary Emergency Contact: Merleen Milliner [place] of [place] Phone: [phone] Relation: Daughter  Code Status:  Full Code  Goals of care: Advanced Directive information Advanced Directives 11/02/2019  Does Patient Have a Medical Advance Directive? Yes  Type of Advance Directive (No Data)  Does patient want to make changes to medical advance directive? No - Patient declined  Would patient like information on creating a medical advance directive? -  Pre-existing out of facility DNR order (yellow form or pink MOST form) -     Chief Complaint  Patient presents with  . Advanced Directive    Advance Care Plan Meeting    HPI:  Pt is a 73 y.o. female who is for advance care plan meeting. Daughter was called twice but did not answer so message was left by MDS coordinator about the meeting. Resident does not want to attend the meeting. The meeting was attended by NP, [place] coordinator, social worker and dietician. She remains to have full code status. She has a gradual weight loss, 7 lbs in 6 months. She was seen by dentist yesterday, 11/01/19. She participates in restorative eating/swallowing, provided cueing for increased consumption and feeding assistance, weighted cup with meal while sitting upright on wheelchair. Discussed medications, vital signs and weights. The meeting lasted for 20 minutes.  Past Medical History:  Diagnosis Date  . Allergy   . Anxiety   . Arthritis   . Asthma   .  Benign essential hypertension 03/18/2017  . Cataract   . Dementia ([place])   . Depression   . Difficulty walking   . Dysphagia, oral phase   . Fibromyalgia   . GERD (gastroesophageal reflux disease)   . Hallucinations   . Hyperlipidemia   . Lower extremity edema   . Parkinson disease ([place])   . Repeated falls   . Rhabdomyolysis   . Stroke ([place])   . TIA (transient ischemic attack)   . Urinary incontinence    Past Surgical History:  Procedure Laterality Date  . ABDOMINAL HYSTERECTOMY    . CATARACT EXTRACTION    . CHOLECYSTECTOMY    . TONSILLECTOMY AND ADENOIDECTOMY      Allergies  Allergen Reactions  . Hydrocodone Other (See Comments)    GI upset    Outpatient Encounter Medications as of 11/02/2019  Medication Sig  . acetaminophen (TYLENOL) 325 MG tablet Take 650 mg by mouth every 6 (six) hours as needed for mild pain or moderate pain.  Marland Kitchen albuterol (PROVENTIL HFA;VENTOLIN HFA) 108 (90 Base) MCG/ACT inhaler Inhale 2 puffs into the lungs every 6 (six) hours as needed for wheezing or shortness of breath.  Marland Kitchen aspirin EC 81 MG tablet Take 81 mg by mouth daily.  . bisacodyl (BISACODYL LAXATIVE) 10 MG suppository Place 10 mg rectally as needed for moderate constipation.  . busPIRone (BUSPAR) 15 MG tablet Take 15 mg by mouth 3 (three) times daily.  . calcium carbonate (TUMS - DOSED IN MG ELEMENTAL CALCIUM) 500 MG chewable tablet Chew 1 tablet by mouth every 6 (six) hours as needed for indigestion or heartburn.  . carbidopa-levodopa (SINEMET  IR) 25-100 MG tablet Take 1 tablet by mouth 3 (three) times daily.  . cholecalciferol (VITAMIN D) 1000 units tablet Take 1,000 Units by mouth daily.   Marland Kitchen. donepezil (ARICEPT) 10 MG tablet Take 10 mg by mouth at bedtime.   . DULoxetine (CYMBALTA) 30 MG capsule Give 3 tablets (90mg ) by mouth daily  . fluticasone (FLONASE) 50 MCG/ACT nasal spray Place 2 sprays into both nostrils daily.   . Lidocaine (SALONPAS PAIN RELIEVING) 4 % PTCH APPLY 2 PATCHES  TOPICALLY TO LOWER BACK ONCE DAILY FOR PAIN (REMOVE AT BEDTIME)  . lithium carbonate 300 MG capsule Take 300 mg by mouth at bedtime.  Marland Kitchen. loperamide (IMODIUM) 2 MG capsule Take 2 mg by mouth every 6 (six) hours as needed for diarrhea or loose stools.  . Loratadine (CLARITIN REDITABS) 5 MG TBDP Take 10 mg by mouth daily as needed.   Marland Kitchen. LORazepam (ATIVAN) 1 MG tablet Take 1 tablet (1 mg total) by mouth every 12 (twelve) hours.  . magnesium hydroxide (MILK OF MAGNESIA) 400 MG/5ML suspension If no BM in 3 days, give 30 cc Milk of Magnesium p.o. x 1 dose in 24 hours as needed (Do not use standing constipation orders for residents with renal failure CFR less than 30. Contact MD for orders) (Physician Order)  . memantine (NAMENDA) 10 MG tablet Take 10 mg by mouth 2 (two) times daily.   . NON FORMULARY Regular finger foods/thin consistency  . Nutritional Supplement LIQD Take 120 mLs by mouth 2 (two) times a day. MedPass  . ondansetron (ZOFRAN) 4 MG tablet Take 4 mg by mouth every 6 (six) hours as needed for nausea or vomiting.  . pantoprazole (PROTONIX) 40 MG tablet Take 40 mg by mouth daily.   . polyethylene glycol (MIRALAX / GLYCOLAX) packet Take 17 g by mouth daily as needed.   . sodium fluoride (PREVIDENT 5000 PLUS) 1.1 % CREA dental cream Place 1 application onto teeth. BRUSH ON TEETH WITH A TOOTHBRUSH EVERY EVENING AFTER PM MOUTH CARE. SPIT OUT EXCESS AND DO NOT RINSE  . Sodium Phosphates (RA SALINE ENEMA RE) If not relieved by Biscodyl suppository, give disposable Saline Enema rectally X 1 dose/24 hrs as needed (Do not use constipation standing orders for residents with renal failure/CFR less than 30. Contact MD for orders)(Physician Or  . ziprasidone (GEODON) 80 MG capsule Take 80 mg by mouth 2 (two) times daily.    No facility-administered encounter medications on file as of 11/02/2019.    Review of Systems  GENERAL: No change in appetite, no fatigue, no fever, chills or weakness MOUTH and  THROAT: Denies oral discomfort, gingival pain RESPIRATORY: no cough, SOB, DOE, wheezing, hemoptysis CARDIAC: No chest pain, edema or palpitations GI: No abdominal pain, diarrhea, constipation, heart burn, nausea or vomiting GU: Denies dysuria, frequency, hematuria or discharge NEUROLOGICAL: Denies dizziness, syncope, numbness, or headache PSYCHIATRIC: Denies feelings of depression or anxiety. No report of hallucinations, insomnia, paranoia, or agitation   Immunization History  Administered Date(s) Administered  . Influenza-Unspecified 03/17/2017, 09/09/2017, 08/17/2018, 08/29/2019  . PPD Test 03/24/2017, 06/02/2018, 06/08/2018  . Pneumococcal Polysaccharide-23 03/17/2017, 06/12/2018   Pertinent  Health Maintenance Due  Topic Date Due  . DEXA SCAN  12/21/2008  . PNA vac Low Risk Adult (2 of 2 - PCV13) 06/13/2019  . INFLUENZA VACCINE  Completed  . COLONOSCOPY  Discontinued   Fall Risk  05/27/2018 05/14/2017  Falls in the past year? No Yes  Number falls in past yr: - 2 or more  Injury with Fall? - No     Vitals:   11/02/19 0846  BP: 118/70  Pulse: 80  Resp: 18  Temp: (!) 97.2 F (36.2 C)  TempSrc: Oral  SpO2: 94%  Weight: 137 lb 12.8 oz (62.5 kg)  Height: 5\' 5"  (1.651 m)   Body mass index is 22.93 kg/m.  Physical Exam  GENERAL APPEARANCE: Well nourished. In no acute distress. Normal body habitus SKIN:  Skin is warm and dry.  MOUTH and THROAT: Lips are without lesions. Oral mucosa is moist and without lesions.  RESPIRATORY: Breathing is even & unlabored, BS CTAB CARDIAC: RRR, no murmur,no extra heart sounds, no edema GI: Abdomen soft, normal BS, no masses, no tenderness EXTREMITIES:  Able to move X 4 extremities NEUROLOGICAL: + tremor. Speech is clear. Alert to self, disoriented to time and place PSYCHIATRIC: Affect and behavior are appropriate  Labs reviewed: Recent Labs    12/17/18 0800 04/30/19 0000  NA 139 144  K 3.4* 3.7  CL 104  --   CO2 27  --     GLUCOSE 98  --   BUN 25* 16  CREATININE 1.14* 0.7  CALCIUM 9.3  --    Recent Labs    12/17/18 0800 04/30/19 0000  AST 17 12*  ALT 9 6*  ALKPHOS 77 88  BILITOT 0.6  --   PROT 7.3  --   ALBUMIN 4.4  --    Recent Labs    12/17/18 0800 04/30/19 0000  WBC 8.9 6.2  NEUTROABS 5.7 4  HGB 12.9 13.4  HCT 39.0 39  MCV 93.5  --   PLT 271 261   Lab Results  Component Value Date   TSH 4.70 04/30/2019   Lab Results  Component Value Date   HGBA1C 5.4 03/15/2018   Lab Results  Component Value Date   CHOL 139 03/15/2018   HDL 46 03/15/2018   LDLCALC 74 03/15/2018   TRIG 97 03/15/2018     Assessment/Plan  1. Advance care planning -Discussed code status, medications, vital signs and weights  2. Parkinson disease (HCC) - has mild tremors of hands when eating, continue carbidopa-levodopa, fall precautions  3. GERD without esophagitis -Stable, continue pantoprazole  4. Schizoaffective disorder, depressive type (HCC) -Mood is stable, continue lithium carbonate, ziprasidone, duloxetine, buspirone and Ativan  5. Vascular dementia without behavioral disturbance (HCC) -Stable, continue memantine and donepezil, supportive care and fall precautions   Family/ staff Communication: Discussed plan of care with IDT.  Labs/tests ordered:   None  Goals of care:   Long-term care   03/17/2018, DNP, FNP-BC [facility] [phone] (Monday-Friday 8:00 a.m. - 5:00 p.m.) [phone] (after hours)

## 2019-11-04 ENCOUNTER — Other Ambulatory Visit: Payer: Self-pay | Admitting: Adult Health

## 2019-11-04 MED ORDER — LORAZEPAM 1 MG PO TABS: 1.0000 mg | ORAL_TABLET | Freq: Two times a day (BID) | ORAL | 0 refills | Status: DC

## 2019-11-18 DIAGNOSIS — M6281 Muscle weakness (generalized): Secondary | ICD-10-CM | POA: Diagnosis not present

## 2019-11-18 DIAGNOSIS — G20 Parkinson's disease: Secondary | ICD-10-CM | POA: Diagnosis not present

## 2019-11-21 DIAGNOSIS — G20 Parkinson's disease: Secondary | ICD-10-CM | POA: Diagnosis not present

## 2019-11-21 DIAGNOSIS — M6281 Muscle weakness (generalized): Secondary | ICD-10-CM | POA: Diagnosis not present

## 2019-11-22 ENCOUNTER — Other Ambulatory Visit: Payer: Self-pay | Admitting: Adult Health

## 2019-11-22 DIAGNOSIS — M6281 Muscle weakness (generalized): Secondary | ICD-10-CM | POA: Diagnosis not present

## 2019-11-22 DIAGNOSIS — G20 Parkinson's disease: Secondary | ICD-10-CM | POA: Diagnosis not present

## 2019-11-22 MED ORDER — LORAZEPAM 1 MG PO TABS: 1.0000 mg | ORAL_TABLET | Freq: Two times a day (BID) | ORAL | 0 refills | Status: DC

## 2019-11-23 DIAGNOSIS — M6281 Muscle weakness (generalized): Secondary | ICD-10-CM | POA: Diagnosis not present

## 2019-11-23 DIAGNOSIS — G20 Parkinson's disease: Secondary | ICD-10-CM | POA: Diagnosis not present

## 2019-11-24 DIAGNOSIS — M6281 Muscle weakness (generalized): Secondary | ICD-10-CM | POA: Diagnosis not present

## 2019-11-24 DIAGNOSIS — G20 Parkinson's disease: Secondary | ICD-10-CM | POA: Diagnosis not present

## 2019-11-25 ENCOUNTER — Non-Acute Institutional Stay (SKILLED_NURSING_FACILITY): Payer: Medicare Other | Admitting: Adult Health

## 2019-11-25 ENCOUNTER — Encounter: Payer: Self-pay | Admitting: Adult Health

## 2019-11-25 DIAGNOSIS — J45909 Unspecified asthma, uncomplicated: Secondary | ICD-10-CM

## 2019-11-25 DIAGNOSIS — F419 Anxiety disorder, unspecified: Secondary | ICD-10-CM

## 2019-11-25 DIAGNOSIS — K219 Gastro-esophageal reflux disease without esophagitis: Secondary | ICD-10-CM

## 2019-11-25 DIAGNOSIS — F251 Schizoaffective disorder, depressive type: Secondary | ICD-10-CM

## 2019-11-25 DIAGNOSIS — F0150 Vascular dementia without behavioral disturbance: Secondary | ICD-10-CM

## 2019-11-25 DIAGNOSIS — M6281 Muscle weakness (generalized): Secondary | ICD-10-CM | POA: Diagnosis not present

## 2019-11-25 DIAGNOSIS — G20 Parkinson's disease: Secondary | ICD-10-CM

## 2019-11-25 NOTE — Progress Notes (Signed)
Location:  [facility] Room Number: 225/B Place of Service:  SNF (31) Provider:  Kenard Gower, DNP, FNP-BC  Patient Care Team: Pecola Lawless, MD as PCP - General (Internal Medicine) Center, [facility] Synetta Shadow as Physician Assistant (Internal Medicine)  Extended Emergency Contact Information Primary Emergency Contact: [name] of [place] Home Phone: [phone] Relation: Daughter Secondary Emergency Contact: [name] [place] of [place] Home Phone: [phone] Relation: Daughter  Code Status:  Full Code  Goals of care: Advanced Directive information Advanced Directives 11/25/2019  Does Patient Have a Medical Advance Directive? Yes  Type of Advance Directive (No Data)  Does patient want to make changes to medical advance directive? No - Patient declined  Would patient like information on creating a medical advance directive? -  Pre-existing out of facility DNR order (yellow form or pink MOST form) -     Chief Complaint  Patient presents with  . Medical Management of Chronic Issues    Routine visit of medical management    HPI:  Pt is a 74 y.o. female seen today for medical management of chronic diseases. She has PMH of anxiety, hypertension, dementia, fibromyalgia, GERD, hyperlipidemia and Parkinson's disease. She participates in restorative eating/swallowing. She is being cued for increased consumption and feeding assistance, uses weighted cup with meal and sitting upright. She was seen in her room today. She verbalized having no concerns. No reported SOB nor wheezing. She takes Fluticasone and PRN Claritin.   Past Medical History:  Diagnosis Date  . Allergy   . Anxiety   . Arthritis   . Asthma   . Benign essential hypertension 03/18/2017  . Cataract   . Dementia (H[facility])   . Depression   . Difficulty walking   . Dysphagia, oral phase   . Fibromyalgia   . GERD  (gastroesophageal reflux disease)   . Hallucinations   . Hyperlipidemia   . Lower extremity edema   . Parkinson disease (HCC)   . Repeated falls   . Rhabdomyolysis   . Stroke (HCC)   . TIA (transient ischemic attack)   . Urinary incontinence    Past Surgical History:  Procedure Laterality Date  . ABDOMINAL HYSTERECTOMY    . CATARACT EXTRACTION    . CHOLECYSTECTOMY    . TONSILLECTOMY AND ADENOIDECTOMY      Allergies  Allergen Reactions  . Hydrocodone Other (See Comments)    GI upset    Outpatient Encounter Medications as of 11/25/2019  Medication Sig  . acetaminophen (TYLENOL) 325 MG tablet Take 650 mg by mouth every 6 (six) hours as needed for mild pain or moderate pain.  Marland Kitchen albuterol (PROVENTIL HFA;VENTOLIN HFA) 108 (90 Base) MCG/ACT inhaler Inhale 2 puffs into the lungs every 6 (six) hours as needed for wheezing or shortness of breath.  Marland Kitchen aspirin EC 81 MG tablet Take 81 mg by mouth daily.  . bisacodyl (BISACODYL LAXATIVE) 10 MG suppository Place 10 mg rectally as needed for moderate constipation.  . busPIRone (BUSPAR) 15 MG tablet Take 15 mg by mouth 3 (three) times daily.  . calcium carbonate (TUMS - DOSED IN MG ELEMENTAL CALCIUM) 500 MG chewable tablet Chew 1 tablet by mouth every 6 (six) hours as needed for indigestion or heartburn.  . carbidopa-levodopa (SINEMET IR) 25-100 MG tablet Take 1 tablet by mouth 3 (three) times daily.  . cholecalciferol (VITAMIN D) 1000 units tablet Take 1,000 Units by mouth daily.   Marland Kitchen donepezil (ARICEPT)  10 MG tablet Take 10 mg by mouth at bedtime.   . DULoxetine (CYMBALTA) 30 MG capsule Give 3 tablets (90mg ) by mouth daily  . fluticasone (FLONASE) 50 MCG/ACT nasal spray Place 2 sprays into both nostrils daily.   . Lidocaine (SALONPAS PAIN RELIEVING) 4 % PTCH APPLY 2 PATCHES TOPICALLY TO LOWER BACK ONCE DAILY FOR PAIN (REMOVE AT BEDTIME)  . lithium carbonate 300 MG capsule Take 300 mg by mouth at bedtime.  Marland Kitchen loperamide (IMODIUM) 2 MG capsule Take  2 mg by mouth every 6 (six) hours as needed for diarrhea or loose stools.  . Loratadine (CLARITIN REDITABS) 5 MG TBDP Take 10 mg by mouth daily as needed.   Marland Kitchen LORazepam (ATIVAN) 1 MG tablet Take 1 tablet (1 mg total) by mouth every 12 (twelve) hours.  . magnesium hydroxide (MILK OF MAGNESIA) 400 MG/5ML suspension If no BM in 3 days, give 30 cc Milk of Magnesium p.o. x 1 dose in 24 hours as needed (Do not use standing constipation orders for residents with renal failure CFR less than 30. Contact MD for orders) (Physician Order)  . memantine (NAMENDA) 10 MG tablet Take 10 mg by mouth 2 (two) times daily.   . NON FORMULARY Regular finger foods/thin consistency  . Nutritional Supplement LIQD Take 120 mLs by mouth 2 (two) times a day. MedPass  . ondansetron (ZOFRAN) 4 MG tablet Take 4 mg by mouth every 6 (six) hours as needed for nausea or vomiting.  . pantoprazole (PROTONIX) 40 MG tablet Take 40 mg by mouth daily.   . polyethylene glycol (MIRALAX / GLYCOLAX) packet Take 17 g by mouth daily as needed.   . sodium fluoride (PREVIDENT 5000 PLUS) 1.1 % CREA dental cream Place 1 application onto teeth. BRUSH ON TEETH WITH A TOOTHBRUSH EVERY EVENING AFTER PM MOUTH CARE. SPIT OUT EXCESS AND DO NOT RINSE  . Sodium Phosphates (RA SALINE ENEMA RE) If not relieved by Biscodyl suppository, give disposable Saline Enema rectally X 1 dose/24 hrs as needed (Do not use constipation standing orders for residents with renal failure/CFR less than 30. Contact MD for orders)(Physician Or  . ziprasidone (GEODON) 80 MG capsule Take 80 mg by mouth 2 (two) times daily.    No facility-administered encounter medications on file as of 11/25/2019.    Review of Systems  GENERAL: No change in appetite, no fatigue, no weight changes, no fever, chills or weakness MOUTH and THROAT: Denies oral discomfort, gingival pain or bleeding RESPIRATORY: no cough, SOB, DOE, wheezing, hemoptysis CARDIAC: No chest pain, edema or palpitations GI:  No abdominal pain, diarrhea, constipation, heart burn, nausea or vomiting GU: Denies dysuria, frequency, hematuria or discharge NEUROLOGICAL: Denies dizziness, syncope, numbness, or headache PSYCHIATRIC: Denies feelings of depression or anxiety. No report of hallucinations, insomnia, paranoia, or agitation   Immunization History  Administered Date(s) Administered  . Influenza-Unspecified 03/17/2017, 09/09/2017, 08/17/2018, 08/29/2019  . PPD Test 03/24/2017, 06/02/2018, 06/08/2018  . Pneumococcal Polysaccharide-23 03/17/2017, 06/12/2018   Pertinent  Health Maintenance Due  Topic Date Due  . DEXA SCAN  12/21/2008  . PNA vac Low Risk Adult (2 of 2 - PCV13) 06/13/2019  . INFLUENZA VACCINE  Completed  . COLONOSCOPY  Discontinued   Fall Risk  05/27/2018 05/14/2017  Falls in the past year? No Yes  Number falls in past yr: - 2 or more  Injury with Fall? - No     Vitals:   11/25/19 0923  BP: (!) 115/57  Pulse: 80  Resp: 20  Temp: Marland Kitchen)  97.5 F (36.4 C)  TempSrc: Oral  SpO2: 94%  Weight: 138 lb 9.6 oz (62.9 kg)  Height: 5\' 5"  (1.651 m)   Body mass index is 23.06 kg/m.  Physical Exam  GENERAL APPEARANCE: Well nourished. In no acute distress. Normal body habitus SKIN:  Skin is warm and dry.  MOUTH and THROAT: Lips are without lesions. Oral mucosa is moist and without lesions. Tongue is normal in shape, size, and color and without lesions RESPIRATORY: Breathing is even & unlabored, BS CTAB CARDIAC: RRR, no murmur,no extra heart sounds, no edema GI: Abdomen soft, normal BS, no masses, no tenderness EXTREMITIES:  Able to move X 4 extremities NEUROLOGICAL: Speech is clear. Alert to self, disoriented to time and place. +tremors PSYCHIATRIC:  Affect and behavior are appropriate  Labs reviewed: Recent Labs    12/17/18 0800 04/30/19 0000  NA 139 144  K 3.4* 3.7  CL 104  --   CO2 27  --   GLUCOSE 98  --   BUN 25* 16  CREATININE 1.14* 0.7  CALCIUM 9.3  --    Recent Labs     12/17/18 0800 04/30/19 0000  AST 17 12*  ALT 9 6*  ALKPHOS 77 88  BILITOT 0.6  --   PROT 7.3  --   ALBUMIN 4.4  --    Recent Labs    12/17/18 0800 04/30/19 0000  WBC 8.9 6.2  NEUTROABS 5.7 4  HGB 12.9 13.4  HCT 39.0 39  MCV 93.5  --   PLT 271 261   Lab Results  Component Value Date   TSH 4.70 04/30/2019   Lab Results  Component Value Date   HGBA1C 5.4 03/15/2018   Lab Results  Component Value Date   CHOL 139 03/15/2018   HDL 46 03/15/2018   LDLCALC 74 03/15/2018   TRIG 97 03/15/2018     Assessment/Plan  1. Asthma with allergic rhinitis without complication, unspecified asthma severity -  No SOB nor wheezing,  Continue Fluticasone and PRN Claritin  2. GERD without esophagitis -Stable, continue pantoprazole  3. Parkinson disease (HCC) - has tremors but stable, continue carbidopa-levodopa  4. Schizoaffective disorder, depressive type (HCC) -Stable, continue lithium carbonate, ziprasidone and duloxetine, followed up by psych NP  5. Anxiety -Stable, continue Ativan and buspirone  6. Vascular dementia without behavioral disturbance (HCC) -Continue memantine and Aricept, supportive care and fall precautions    Family/ staff Communication: Discussed plan of care with resident and charge nurse.  Labs/tests ordered: None  Goals of care:   Long-term care   03/17/2018, DNP, FNP-BC [facility] and Adult Medicine [phone] (Monday-Friday 8:00 a.m. - 5:00 p.m.) [phone] (after hours)

## 2019-12-05 ENCOUNTER — Other Ambulatory Visit: Payer: Self-pay | Admitting: Adult Health

## 2019-12-05 MED ORDER — LORAZEPAM 1 MG PO TABS: 1.0000 mg | ORAL_TABLET | Freq: Two times a day (BID) | ORAL | 0 refills | Status: DC

## 2019-12-15 ENCOUNTER — Non-Acute Institutional Stay (SKILLED_NURSING_FACILITY): Payer: Medicare Other | Admitting: Adult Health

## 2019-12-15 ENCOUNTER — Encounter: Payer: Self-pay | Admitting: Adult Health

## 2019-12-15 DIAGNOSIS — F251 Schizoaffective disorder, depressive type: Secondary | ICD-10-CM

## 2019-12-15 DIAGNOSIS — G20 Parkinson's disease: Secondary | ICD-10-CM

## 2019-12-15 DIAGNOSIS — K219 Gastro-esophageal reflux disease without esophagitis: Secondary | ICD-10-CM | POA: Diagnosis not present

## 2019-12-15 DIAGNOSIS — J309 Allergic rhinitis, unspecified: Secondary | ICD-10-CM | POA: Diagnosis not present

## 2019-12-15 DIAGNOSIS — F0150 Vascular dementia without behavioral disturbance: Secondary | ICD-10-CM

## 2019-12-15 NOTE — Progress Notes (Signed)
Location:  [facility] Room Number: 225/B Place of Service:  SNF (31) Provider:  Durenda Age, DNP, FNP-BC  Patient Care Team: Hendricks Limes, MD as PCP - General (Internal Medicine) Center, [place] ([place]) Rolm Baptise as Physician Assistant (Internal Medicine)  Extended Emergency Contact Information Primary Emergency Contact: Vivia [place] of [place] Phone: [phone] Relation: Daughter Secondary Emergency Contact: Merleen Milliner [place] of [place] Phone: [phone] Relation: Daughter  Code Status:  Full Code  Goals of care: Advanced Directive information Advanced Directives 12/15/2019  Does Patient Have a Medical Advance Directive? Yes  Type of Advance Directive (No Data)  Does patient want to make changes to medical advance directive? No - Patient declined  Would patient like information on creating a medical advance directive? -  Pre-existing out of facility DNR order (yellow form or pink MOST form) -     Chief Complaint  Patient presents with  . Acute Visit    Palliative Care     HPI:  Pt is a 74 y.o. female who has PMH of anxiety, hypertension, dementia, fibromyalgia, GERD, hyperlipidemia and Parkinson's disease. Social worker received a voice mail from Palliative care about the daughter requesting for the resident to be followed up by them. Resident was admitted to [facility] on 01/21/19 post hospitalization. She is a long-term resident. She takes Lithium, Ziprasidone, Ativan, Duloxetine and Buspirone for Schizoaffective disorder, depressive type. Mood has been stable. She participates in restorative program, eating and swallowing. She has tremors on both hands. She takes Carvidopa-Levodopa for Parkinson's disease.    Past Medical History:  Diagnosis Date  . Allergy   . Anxiety   . Arthritis   . Asthma   . Benign essential hypertension  03/18/2017  . Cataract   . Dementia ([place])   . Depression   . Difficulty walking   . Dysphagia, oral phase   . Fibromyalgia   . GERD (gastroesophageal reflux disease)   . Hallucinations   . Hyperlipidemia   . Lower extremity edema   . Parkinson disease ([place])   . Repeated falls   . Rhabdomyolysis   . Stroke ([place])   . TIA (transient ischemic attack)   . Urinary incontinence    Past Surgical History:  Procedure Laterality Date  . ABDOMINAL HYSTERECTOMY    . CATARACT EXTRACTION    . CHOLECYSTECTOMY    . TONSILLECTOMY AND ADENOIDECTOMY      Allergies  Allergen Reactions  . Hydrocodone Other (See Comments)    GI upset    Outpatient Encounter Medications as of 12/15/2019  Medication Sig  . acetaminophen (TYLENOL) 325 MG tablet Take 650 mg by mouth every 6 (six) hours as needed for mild pain or moderate pain.  Marland Kitchen albuterol (PROVENTIL HFA;VENTOLIN HFA) 108 (90 Base) MCG/ACT inhaler Inhale 2 puffs into the lungs every 6 (six) hours as needed for wheezing or shortness of breath.  Marland Kitchen aspirin EC 81 MG tablet Take 81 mg by mouth daily.  . bisacodyl (BISACODYL LAXATIVE) 10 MG suppository Place 10 mg rectally as needed for moderate constipation.  . busPIRone (BUSPAR) 15 MG tablet Take 15 mg by mouth 3 (three) times daily.  . calcium carbonate (TUMS - DOSED IN MG ELEMENTAL CALCIUM) 500 MG chewable tablet Chew 1 tablet by mouth every 6 (six) hours as needed for indigestion or heartburn.  . carbidopa-levodopa (SINEMET IR) 25-100 MG tablet Take 1 tablet by mouth 3 (three) times daily.  Marland Kitchen  cholecalciferol (VITAMIN D) 1000 units tablet Take 1,000 Units by mouth daily.   Marland Kitchen donepezil (ARICEPT) 10 MG tablet Take 10 mg by mouth at bedtime.   . DULoxetine (CYMBALTA) 30 MG capsule Give 3 tablets (90mg ) by mouth daily  . fluticasone (FLONASE) 50 MCG/ACT nasal spray Place 2 sprays into both nostrils daily.   . Lidocaine (SALONPAS PAIN RELIEVING) 4 % PTCH APPLY 2 PATCHES TOPICALLY TO LOWER BACK ONCE DAILY FOR  PAIN (REMOVE AT BEDTIME)  . lithium carbonate 300 MG capsule Take 300 mg by mouth at bedtime.  loperamide (IMODIUM) 2 MG capsule Take 2 mg by mouth every 6 (six) hours as needed for diarrhea or loose stools.  . Loratadine (CLARITIN REDITABS) 5 MG TBDP Take 10 mg by mouth daily as needed.   Marland Kitchen LORazepam (ATIVAN) 1 MG tablet Take 1 tablet (1 mg total) by mouth every 12 (twelve) hours.  . magnesium hydroxide (MILK OF MAGNESIA) 400 MG/5ML suspension If no BM in 3 days, give 30 cc Milk of Magnesium p.o. x 1 dose in 24 hours as needed (Do not use standing constipation orders for residents with renal failure CFR less than 30. Contact MD for orders) (Physician Order)  . memantine (NAMENDA) 10 MG tablet Take 10 mg by mouth 2 (two) times daily.   . NON FORMULARY Regular finger foods/thin consistency  . Nutritional Supplement LIQD Take 120 mLs by mouth 2 (two) times a day. MedPass  . ondansetron (ZOFRAN) 4 MG tablet Take 4 mg by mouth every 6 (six) hours as needed for nausea or vomiting.  . pantoprazole (PROTONIX) 40 MG tablet Take 40 mg by mouth daily.   . polyethylene glycol (MIRALAX / GLYCOLAX) packet Take 17 g by mouth daily as needed.   . sodium fluoride (PREVIDENT 5000 PLUS) 1.1 % CREA dental cream Place 1 application onto teeth. BRUSH ON TEETH WITH A TOOTHBRUSH EVERY EVENING AFTER PM MOUTH CARE. SPIT OUT EXCESS AND DO NOT RINSE  . Sodium Phosphates (RA SALINE ENEMA RE) If not relieved by Biscodyl suppository, give disposable Saline Enema rectally X 1 dose/24 hrs as needed (Do not use constipation standing orders for residents with renal failure/CFR less than 30. Contact MD for orders)(Physician Or  . ziprasidone (GEODON) 80 MG capsule Take 80 mg by mouth 2 (two) times daily.    No facility-administered encounter medications on file as of 12/15/2019.    Review of Systems  GENERAL: No change in appetite, no fatigue, no weight changes, no fever, chills or weakness MOUTH and THROAT: Denies oral  discomfort, gingival pain or bleeding RESPIRATORY: no cough, SOB, DOE, wheezing, hemoptysis CARDIAC: No chest pain, edema or palpitations GI: No abdominal pain, diarrhea, constipation, heart burn, nausea or vomiting GU: Denies dysuria, frequency, hematuria or discharge NEUROLOGICAL: Denies dizziness, syncope, numbness, or headache PSYCHIATRIC: Denies feelings of depression or anxiety. No report of hallucinations, insomnia, paranoia, or agitation   Immunization History  Administered Date(s) Administered  . Influenza-Unspecified 03/17/2017, 09/09/2017, 08/17/2018, 08/29/2019  . PPD Test 03/24/2017, 06/02/2018, 06/08/2018  . Pneumococcal Polysaccharide-23 03/17/2017, 06/12/2018   Pertinent  Health Maintenance Due  Topic Date Due  . DEXA SCAN  12/21/2008  . PNA vac Low Risk Adult (2 of 2 - PCV13) 06/13/2019  . INFLUENZA VACCINE  Completed  . COLONOSCOPY  Discontinued   Fall Risk  05/27/2018 05/14/2017  Falls in the past year? No Yes  Number falls in past yr: - 2 or more  Injury with Fall? - No  Vitals:   12/15/19 1544  BP: 116/73  Pulse: 73  Resp: 18  Temp: (!) 96.6 F (35.9 C)  TempSrc: Oral  SpO2: 94%  Weight: 138 lb 9.6 oz (62.9 kg)  Height: 5\' 5"  (1.651 m)   Body mass index is 23.06 kg/m.  Physical Exam  GENERAL APPEARANCE: Well nourished. In no acute distress. Normal body habitus SKIN:  Skin is warm and dry.  MOUTH and THROAT: Lips are without lesions. Oral mucosa is moist and without lesions. Tongue is normal in shape, size, and color and without lesions RESPIRATORY: Breathing is even & unlabored, BS CTAB CARDIAC: RRR, no murmur,no extra heart sounds, no edema GI: Abdomen soft, normal BS, no masses, no tenderness NEUROLOGICAL: + tremor. Speech is clear.  PSYCHIATRIC:  Affect and behavior are appropriate  Labs reviewed: Recent Labs    12/17/18 0800 04/30/19 0000 09/14/19 0000  NA 139 144 143  K 3.4* 3.7 4.5  CL 104  --  102  CO2 27  --  28*  GLUCOSE  98  --   --   BUN 25* 16 23*  CREATININE 1.14* 0.7 0.9  CALCIUM 9.3  --  9.5   Recent Labs    12/17/18 0800 04/30/19 0000  AST 17 12*  ALT 9 6*  ALKPHOS 77 88  BILITOT 0.6  --   PROT 7.3  --   ALBUMIN 4.4  --    Recent Labs    12/17/18 0800 04/30/19 0000  WBC 8.9 6.2  NEUTROABS 5.7 4  HGB 12.9 13.4  HCT 39.0 39  MCV 93.5  --   PLT 271 261   Lab Results  Component Value Date   TSH 4.70 04/30/2019   Lab Results  Component Value Date   HGBA1C 7.0 09/14/2019   Lab Results  Component Value Date   CHOL 139 03/15/2018   HDL 46 03/15/2018   LDLCALC 74 03/15/2018   TRIG 97 03/15/2018     Assessment/Plan  1. Parkinson disease (HCC) - has tremors on both hands, continue Carvidopa-Levodopa - referring for palliative care consult for goals of care  2. GERD without esophagitis - no complaints of heartburns, continue Pantoprazole  3. Allergic rhinitis, unspecified seasonality, unspecified trigger - continue Fluticasone nasal spray  4. Schizoaffective disorder, depressive type (HCC) - stable, continue lithium carbonate, Ziprazidone, duloxetine, buspirone and Ativan, followed by psych NP  5. Vascular dementia without behavioral disturbance (HCC) - continue Aricept and Memantine, supportive care    Family/ staff Communication:  Discussed plan of care with resident.  Labs/tests ordered:  Lithium level, CMP, CBC  Goals of care:  Long-term care    03/17/2018, DNP, FNP-BC [facility] and Adult Medicine [phone] (Monday-Friday 8:00 a.m. - 5:00 p.m.) [phone] (after hours)

## 2019-12-19 ENCOUNTER — Other Ambulatory Visit: Payer: Self-pay | Admitting: Adult Health

## 2019-12-19 MED ORDER — LORAZEPAM 1 MG PO TABS: 1.0000 mg | ORAL_TABLET | Freq: Two times a day (BID) | ORAL | 0 refills | Status: DC

## 2019-12-22 DIAGNOSIS — K219 Gastro-esophageal reflux disease without esophagitis: Secondary | ICD-10-CM | POA: Diagnosis not present

## 2019-12-22 DIAGNOSIS — D649 Anemia, unspecified: Secondary | ICD-10-CM | POA: Diagnosis not present

## 2019-12-22 DIAGNOSIS — F419 Anxiety disorder, unspecified: Secondary | ICD-10-CM | POA: Diagnosis not present

## 2019-12-22 DIAGNOSIS — R32 Unspecified urinary incontinence: Secondary | ICD-10-CM | POA: Diagnosis not present

## 2019-12-22 DIAGNOSIS — M797 Fibromyalgia: Secondary | ICD-10-CM | POA: Diagnosis not present

## 2019-12-22 LAB — CBC: RBC: 3.91 (ref 3.87–5.11)

## 2019-12-22 LAB — COMPREHENSIVE METABOLIC PANEL
Albumin: 3.8 (ref 3.5–5.0)
Calcium: 9.3 (ref 8.7–10.7)
GFR calc Af Amer: >90
GFR calc non Af Amer: 88.82
Globulin: 2.2

## 2019-12-22 LAB — HEPATIC FUNCTION PANEL
ALT: 7 (ref 7–35)
AST: 13 (ref 13–35)
Alkaline Phosphatase: 65 (ref 25–125)
Bilirubin, Total: 0.5

## 2019-12-22 LAB — BASIC METABOLIC PANEL
BUN: 15 (ref 4–21)
CO2: 29 — AB (ref 13–22)
Chloride: 106 (ref 99–108)
Creatinine: 0.6 (ref 0.5–1.1)
Glucose: 83
Potassium: 3.5 (ref 3.4–5.3)
Sodium: 142 (ref 137–147)

## 2019-12-22 LAB — CBC AND DIFFERENTIAL
HCT: 37 (ref 36–46)
Hemoglobin: 12.6 (ref 12.0–16.0)
Platelets: 256 (ref 150–399)
WBC: 5.1

## 2019-12-23 DIAGNOSIS — M797 Fibromyalgia: Secondary | ICD-10-CM | POA: Diagnosis not present

## 2019-12-23 DIAGNOSIS — F419 Anxiety disorder, unspecified: Secondary | ICD-10-CM | POA: Diagnosis not present

## 2019-12-23 DIAGNOSIS — N390 Urinary tract infection, site not specified: Secondary | ICD-10-CM | POA: Diagnosis not present

## 2019-12-23 DIAGNOSIS — K219 Gastro-esophageal reflux disease without esophagitis: Secondary | ICD-10-CM | POA: Diagnosis not present

## 2019-12-26 DIAGNOSIS — D649 Anemia, unspecified: Secondary | ICD-10-CM | POA: Diagnosis not present

## 2019-12-26 DIAGNOSIS — R262 Difficulty in walking, not elsewhere classified: Secondary | ICD-10-CM | POA: Diagnosis not present

## 2019-12-26 DIAGNOSIS — F251 Schizoaffective disorder, depressive type: Secondary | ICD-10-CM | POA: Diagnosis not present

## 2019-12-26 LAB — CBC AND DIFFERENTIAL
HCT: 33 — AB (ref 36–46)
Hemoglobin: 11.8 — AB (ref 12.0–16.0)
Platelets: 267 (ref 150–399)
WBC: 5.7

## 2019-12-26 LAB — BASIC METABOLIC PANEL
BUN: 15 (ref 4–21)
CO2: 27 — AB (ref 13–22)
Chloride: 106 (ref 99–108)
Creatinine: 0.5 (ref 0.5–1.1)
Glucose: 112
Potassium: 3.9 (ref 3.4–5.3)
Sodium: 144 (ref 137–147)

## 2019-12-26 LAB — CBC: RBC: 3.63 — AB (ref 3.87–5.11)

## 2019-12-26 LAB — COMPREHENSIVE METABOLIC PANEL
Albumin: 3.5 (ref 3.5–5.0)
Calcium: 8.7 (ref 8.7–10.7)
GFR calc Af Amer: >90
GFR calc non Af Amer: >90
Globulin: 2.1

## 2019-12-26 LAB — HEPATIC FUNCTION PANEL
ALT: 5 — AB (ref 7–35)
AST: 11 — AB (ref 13–35)
Alkaline Phosphatase: 60 (ref 25–125)
Bilirubin, Total: 0.3

## 2019-12-27 ENCOUNTER — Telehealth: Payer: Self-pay | Admitting: Licensed Clinical Social Worker

## 2019-12-27 NOTE — Telephone Encounter (Signed)
Palliative Care SW left a vm with patient's daughter, Juliette Alcide, to address MOST form.

## 2019-12-28 ENCOUNTER — Non-Acute Institutional Stay (SKILLED_NURSING_FACILITY): Payer: Medicare Other | Admitting: Adult Health

## 2019-12-28 ENCOUNTER — Encounter: Payer: Self-pay | Admitting: Adult Health

## 2019-12-28 DIAGNOSIS — G903 Multi-system degeneration of the autonomic nervous system: Secondary | ICD-10-CM

## 2019-12-28 DIAGNOSIS — F251 Schizoaffective disorder, depressive type: Secondary | ICD-10-CM | POA: Diagnosis not present

## 2019-12-28 DIAGNOSIS — M545 Low back pain: Secondary | ICD-10-CM

## 2019-12-28 DIAGNOSIS — G8929 Other chronic pain: Secondary | ICD-10-CM | POA: Diagnosis not present

## 2019-12-28 DIAGNOSIS — K219 Gastro-esophageal reflux disease without esophagitis: Secondary | ICD-10-CM | POA: Diagnosis not present

## 2019-12-28 DIAGNOSIS — J4520 Mild intermittent asthma, uncomplicated: Secondary | ICD-10-CM

## 2019-12-28 DIAGNOSIS — F0150 Vascular dementia without behavioral disturbance: Secondary | ICD-10-CM

## 2019-12-28 NOTE — Progress Notes (Signed)
Location:  [facility] Room Number: 225/B Place of Service:  SNF (31) Provider:  Durenda Age, DNP, FNP-BC  Patient Care Team: Hendricks Limes, MD as PCP - General (Internal Medicine) Center, [place] ([place]) Rolm Baptise as Physician Assistant (Internal Medicine)  Extended Emergency Contact Information Primary Emergency Contact: Vivia [place] of [place] Phone: [phone] Relation: Daughter Secondary Emergency Contact: Merleen Milliner States of [place] Phone: [phone] Relation: Daughter  Code Status:  Full Code  Goals of care: Advanced Directive information Advanced Directives 12/28/2019  Does Patient Have a Medical Advance Directive? Yes  Type of Advance Directive (No Data)  Does patient want to make changes to medical advance directive? No - Patient declined  Would patient like information on creating a medical advance directive? -  Pre-existing out of facility DNR order (yellow form or pink MOST form) -     Chief Complaint  Patient presents with  . Medical Management of Chronic Issues    Routine visit of medical mnagement    HPI:  Pt is a 74 y.o. female seen today for medical management of chronic diseases.  She has PMH of anxiety, hypertension, dementia, myalgia, GERD, hyperlipidemia and Parkinson's disease. She continues to participate in restorative eating/swallowing.  She is provided cueing for increased consumption and feeding assistance when needed.  She uses weighted cup with meals while sitting upright on wheelchair. BP 89/50., she denies dizziness. She takes carbidopa-levodopa for Parkinson's disease.  No reported inappropriate behavior. Currently taking duloxetine, lithium carbonate, Ativan and buspirone for schizoaffective disorder.  She was recently referred for palliative care care daughter's request.   Past Medical History:  Diagnosis Date  . Allergy   .  Anxiety   . Arthritis   . Asthma   . Benign essential hypertension 03/18/2017  . Cataract   . Dementia ([place])   . Depression   . Difficulty walking   . Dysphagia, oral phase   . Fibromyalgia   . GERD (gastroesophageal reflux disease)   . Hallucinations   . Hyperlipidemia   . Lower extremity edema   . Parkinson disease ([place])   . Repeated falls   . Rhabdomyolysis   . Stroke ([place])   . TIA (transient ischemic attack)   . Urinary incontinence    Past Surgical History:  Procedure Laterality Date  . ABDOMINAL HYSTERECTOMY    . CATARACT EXTRACTION    . CHOLECYSTECTOMY    . TONSILLECTOMY AND ADENOIDECTOMY      Allergies  Allergen Reactions  . Hydrocodone Other (See Comments)    GI upset    Outpatient Encounter Medications as of 12/28/2019  Medication Sig  . acetaminophen (TYLENOL) 325 MG tablet Take 650 mg by mouth every 6 (six) hours as needed for mild pain or moderate pain.  Marland Kitchen albuterol (PROVENTIL HFA;VENTOLIN HFA) 108 (90 Base) MCG/ACT inhaler Inhale 2 puffs into the lungs every 6 (six) hours as needed for wheezing or shortness of breath.  Marland Kitchen aspirin EC 81 MG tablet Take 81 mg by mouth daily.  . bisacodyl (BISACODYL LAXATIVE) 10 MG suppository Place 10 mg rectally as needed for moderate constipation.  . busPIRone (BUSPAR) 15 MG tablet Take 15 mg by mouth 3 (three) times daily.  . calcium carbonate (TUMS - DOSED IN MG ELEMENTAL CALCIUM) 500 MG chewable tablet Chew 1 tablet by mouth every 6 (six) hours as needed for indigestion or heartburn.  . carbidopa-levodopa (SINEMET IR) 25-100 MG tablet Take 1 tablet  by mouth 3 (three) times daily.  . cholecalciferol (VITAMIN D) 1000 units tablet Take 1,000 Units by mouth daily.   Marland Kitchen donepezil (ARICEPT) 10 MG tablet Take 10 mg by mouth at bedtime.   . DULoxetine (CYMBALTA) 30 MG capsule Give 3 tablets (90mg ) by mouth daily  . fluticasone (FLONASE) 50 MCG/ACT nasal spray Place 2 sprays into both nostrils daily.   . Lidocaine (SALONPAS PAIN  RELIEVING) 4 % PTCH APPLY 2 PATCHES TOPICALLY TO LOWER BACK ONCE DAILY FOR PAIN (REMOVE AT BEDTIME)  . lithium carbonate 300 MG capsule Take 300 mg by mouth at bedtime.  loperamide (IMODIUM) 2 MG capsule Take 2 mg by mouth every 6 (six) hours as needed for diarrhea or loose stools.  . Loratadine (CLARITIN REDITABS) 5 MG TBDP Take 10 mg by mouth daily as needed.   Marland Kitchen LORazepam (ATIVAN) 1 MG tablet Take 1 tablet (1 mg total) by mouth every 12 (twelve) hours.  . magnesium hydroxide (MILK OF MAGNESIA) 400 MG/5ML suspension If no BM in 3 days, give 30 cc Milk of Magnesium p.o. x 1 dose in 24 hours as needed (Do not use standing constipation orders for residents with renal failure CFR less than 30. Contact MD for orders) (Physician Order)  . memantine (NAMENDA) 10 MG tablet Take 10 mg by mouth 2 (two) times daily.   . NON FORMULARY Regular finger foods/thin consistency  . Nutritional Supplement LIQD Take 120 mLs by mouth 2 (two) times a day. MedPass  . ondansetron (ZOFRAN) 4 MG tablet Take 4 mg by mouth every 6 (six) hours as needed for nausea or vomiting.  . pantoprazole (PROTONIX) 40 MG tablet Take 40 mg by mouth daily.   . polyethylene glycol (MIRALAX / GLYCOLAX) packet Take 17 g by mouth daily as needed.   . sodium fluoride (PREVIDENT 5000 PLUS) 1.1 % CREA dental cream Place 1 application onto teeth. BRUSH ON TEETH WITH A TOOTHBRUSH EVERY EVENING AFTER PM MOUTH CARE. SPIT OUT EXCESS AND DO NOT RINSE  . Sodium Phosphates (RA SALINE ENEMA RE) If not relieved by Biscodyl suppository, give disposable Saline Enema rectally X 1 dose/24 hrs as needed (Do not use constipation standing orders for residents with renal failure/CFR less than 30. Contact MD for orders)(Physician Or  . ziprasidone (GEODON) 80 MG capsule Take 80 mg by mouth 2 (two) times daily.    No facility-administered encounter medications on file as of 12/28/2019.    Review of Systems  GENERAL: No change in appetite, no fatigue, no weight  changes, no fever, chills or weakness MOUTH and THROAT: Denies oral discomfort, gingival pain or bleeding RESPIRATORY: no cough, SOB, DOE, wheezing, hemoptysis CARDIAC: No chest pain, edema or palpitations GI: No abdominal pain, diarrhea, constipation, heart burn, nausea or vomiting GU: Denies dysuria, frequency, hematuria or discharge NEUROLOGICAL: Denies dizziness, syncope, numbness, or headache PSYCHIATRIC: Denies feelings of depression or anxiety. No report of hallucinations, insomnia, paranoia, or agitation   Immunization History  Administered Date(s) Administered  . Influenza-Unspecified 03/17/2017, 09/09/2017, 08/17/2018, 08/29/2019  . PPD Test 03/24/2017, 06/02/2018, 06/08/2018  . Pneumococcal Polysaccharide-23 03/17/2017, 06/12/2018   Pertinent  Health Maintenance Due  Topic Date Due  . DEXA SCAN  12/21/2008  . PNA vac Low Risk Adult (2 of 2 - PCV13) 06/13/2019  . INFLUENZA VACCINE  Completed   Fall Risk  05/27/2018 05/14/2017  Falls in the past year? No Yes  Number falls in past yr: - 2 or more  Injury with Fall? - No  Vitals:   12/28/19 0847  BP: (!) 89/50  Pulse: 72  Resp: 18  Temp: (!) 97.4 F (36.3 C)  TempSrc: Oral  SpO2: 94%  Weight: 138 lb 3.2 oz (62.7 kg)  Height: 5\' 5"  (1.651 m)   Body mass index is 23 kg/m.  Physical Exam  GENERAL APPEARANCE: Well nourished. In no acute distress. Normal body habitus SKIN:  Skin is warm and dry.  MOUTH and THROAT: Lips are without lesions. Oral mucosa is moist and without lesions. Tongue is normal in shape, size, and color and without lesions RESPIRATORY: Breathing is even & unlabored, BS CTAB CARDIAC: RRR, no murmur,no extra heart sounds, no edema GI: Abdomen soft, normal BS, no masses, no tenderness NEUROLOGICAL: + tremor. Speech is clear. PSYCHIATRIC:  Affect and behavior are appropriate  Labs reviewed: Recent Labs    04/30/19 0000 12/22/19 0000 12/26/19 0000  NA 144 142 144  K 3.7 3.5 3.9  CL  --   106 106  CO2  --  29* 27*  BUN 16 15 15   CREATININE 0.7 0.6 0.5  CALCIUM  --  9.3 8.7   Recent Labs    04/30/19 0000 12/22/19 0000 12/26/19 0000  AST 12* 13 11*  ALT 6* 7 5*  ALKPHOS 88 65 60  ALBUMIN  --  3.8 3.5   Recent Labs    04/30/19 0000 12/22/19 0000 12/26/19 0000  WBC 6.2 5.1 5.7  NEUTROABS 4  --   --   HGB 13.4 12.6 11.8*  HCT 39 37 33*  PLT 261 256 267   Lab Results  Component Value Date   TSH 4.70 04/30/2019   Lab Results  Component Value Date   HGBA1C 5.4 03/15/2018   Lab Results  Component Value Date   CHOL 139 03/15/2018   HDL 46 03/15/2018   LDLCALC 74 03/15/2018   TRIG 97 03/15/2018     Assessment/Plan  1. 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 and off at bedtime -Continue carbidopa levodopa -Recently referred for palliative care  2. GERD without esophagitis -No reported heartburns, continue pantoprazole  3. Mild intermittent asthma with allergic rhinitis without complication - no wheezing/SOB, continue PRN Claritin, fluticasone nasal spray  4. Chronic bilateral low back pain without sciatica - stable, continue Salonpas patch  5. Vascular dementia without behavioral disturbance (HCC) -Continue donepezil and memantine and supportive care  6.  Schizoaffective disorder, depressive type - mood is stable, continue duloxetine, lithium carbonate, Ativan and buspirone, followed up by psych NP   Family/ staff Communication: Discussed plan of care with resident and charge nurse.  Labs/tests ordered: None  Goals of care:   Long-term care/palliative care   03/17/2018, DNP, FNP-BC [facility] and Adult Medicine [phone] (Monday-Friday 8:00 a.m. - 5:00 p.m.) [phone] (after hours)

## 2020-01-02 DIAGNOSIS — Z23 Encounter for immunization: Secondary | ICD-10-CM | POA: Diagnosis not present

## 2020-01-06 ENCOUNTER — Non-Acute Institutional Stay: Payer: Medicare Other | Admitting: Licensed Clinical Social Worker

## 2020-01-06 ENCOUNTER — Encounter: Payer: Self-pay | Admitting: Adult Health

## 2020-01-06 ENCOUNTER — Non-Acute Institutional Stay (SKILLED_NURSING_FACILITY): Payer: Medicare Other | Admitting: Adult Health

## 2020-01-06 DIAGNOSIS — K219 Gastro-esophageal reflux disease without esophagitis: Secondary | ICD-10-CM | POA: Diagnosis not present

## 2020-01-06 DIAGNOSIS — J309 Allergic rhinitis, unspecified: Secondary | ICD-10-CM

## 2020-01-06 DIAGNOSIS — Z515 Encounter for palliative care: Secondary | ICD-10-CM

## 2020-01-06 DIAGNOSIS — G20 Parkinson's disease: Secondary | ICD-10-CM

## 2020-01-06 DIAGNOSIS — Z7189 Other specified counseling: Secondary | ICD-10-CM

## 2020-01-06 DIAGNOSIS — G903 Multi-system degeneration of the autonomic nervous system: Secondary | ICD-10-CM | POA: Diagnosis not present

## 2020-01-06 DIAGNOSIS — F251 Schizoaffective disorder, depressive type: Secondary | ICD-10-CM | POA: Diagnosis not present

## 2020-01-06 DIAGNOSIS — F0150 Vascular dementia without behavioral disturbance: Secondary | ICD-10-CM

## 2020-01-06 NOTE — Progress Notes (Signed)
Location:  [facility] Room Number: 225/B Place of Service:  SNF (31) Provider:  Durenda Age, DNP, FNP-BC  Patient Care Team: Hendricks Limes, MD as PCP - General (Internal Medicine) [facility], [place] ([place]) Rolm Baptise as Physician Assistant (Internal Medicine)  Extended Emergency Contact Information Primary Emergency Contact: Vivia [place] of [place] Phone: [phone] Relation: Daughter Secondary Emergency Contact: Merleen Milliner [place] of [place] Phone: [phone] Relation: Daughter  Code Status:  Full Code  Goals of care: Advanced Directive information Advanced Directives 01/06/2020  Does Patient Have a Medical Advance Directive? Yes  Type of Advance Directive (No Data)  Does patient want to make changes to medical advance directive? No - Patient declined  Would patient like information on creating a medical advance directive? -  Pre-existing out of facility DNR order (yellow form or pink MOST form) -     Chief Complaint  Patient presents with  . Advanced Directive    Advance Care Planning     HPI:  Pt is a 74 y.o. female who had advance care planning today attended by NP, social worker, MDS coordinator, activity Pharmacist, community, who attended via telephone conference. Code status remains to be full code. Discussed medications, vital signs and weights. Discussed with daughter if psychotropic medications can be reduced for GDR (gradual dose reduction). Daughter does not want to change any of the psychotropic medications.  She said that she has history of having hallucinations and the current psychotropic medications has helped resident control behavior. She was recently referred for palliative care per daughter's request. Latest weight is 138.2 lbs. She has been having weight loss. Daughter will e-mail specific foods that she knows resident would like to eat. Resident  is currently having restorative eating/swallowing program. On 01/02/20, resident had her 2nd Moderna COVID-19 vaccine. There was no reported side effects. Daughter requested for somebody to pick up the phone for her mother at least twice a week when she calls. The meeting lasted for 30 minutes..    Past Medical History:  Diagnosis Date  . Allergy   . Anxiety   . Arthritis   . Asthma   . Benign essential hypertension 03/18/2017  . Cataract   . Dementia ([place])   . Depression   . Difficulty walking   . Dysphagia, oral phase   . Fibromyalgia   . GERD (gastroesophageal reflux disease)   . Hallucinations   . Hyperlipidemia   . Lower extremity edema   . Parkinson disease ([place])   . Repeated falls   . Rhabdomyolysis   . Stroke ([place])   . TIA (transient ischemic attack)   . Urinary incontinence    Past Surgical History:  Procedure Laterality Date  . ABDOMINAL HYSTERECTOMY    . CATARACT EXTRACTION    . CHOLECYSTECTOMY    . TONSILLECTOMY AND ADENOIDECTOMY      Allergies  Allergen Reactions  . Hydrocodone Other (See Comments)    GI upset    Outpatient Encounter Medications as of 01/06/2020  Medication Sig  . acetaminophen (TYLENOL) 325 MG tablet Take 650 mg by mouth every 6 (six) hours as needed for mild pain or moderate pain.  Marland Kitchen albuterol (PROVENTIL HFA;VENTOLIN HFA) 108 (90 Base) MCG/ACT inhaler Inhale 2 puffs into the lungs every 6 (six) hours as needed for wheezing or shortness of breath.  Marland Kitchen aspirin EC 81 MG tablet Take 81 mg by mouth daily.  . bisacodyl (BISACODYL LAXATIVE) 10 MG  suppository Place 10 mg rectally as needed for moderate constipation.  . busPIRone (BUSPAR) 15 MG tablet Take 15 mg by mouth 3 (three) times daily.  . calcium carbonate (TUMS - DOSED IN MG ELEMENTAL CALCIUM) 500 MG chewable tablet Chew 1 tablet by mouth every 6 (six) hours as needed for indigestion or heartburn.  . carbidopa-levodopa (SINEMET IR) 25-100 MG tablet Take 1 tablet by mouth 3 (three) times  daily.  . cholecalciferol (VITAMIN D) 1000 units tablet Take 1,000 Units by mouth daily.   Marland Kitchen donepezil (ARICEPT) 10 MG tablet Take 10 mg by mouth at bedtime.   . DULoxetine (CYMBALTA) 30 MG capsule Give 3 tablets (90mg ) by mouth daily  . fluticasone (FLONASE) 50 MCG/ACT nasal spray Place 2 sprays into both nostrils daily.   . Lidocaine (SALONPAS PAIN RELIEVING) 4 % PTCH APPLY 2 PATCHES TOPICALLY TO LOWER BACK ONCE DAILY FOR PAIN (REMOVE AT BEDTIME)  . lithium carbonate 300 MG capsule Take 300 mg by mouth at bedtime.  loperamide (IMODIUM) 2 MG capsule Take 2 mg by mouth every 6 (six) hours as needed for diarrhea or loose stools.  . Loratadine (CLARITIN REDITABS) 5 MG TBDP Take 10 mg by mouth daily as needed.   Marland Kitchen LORazepam (ATIVAN) 1 MG tablet Take 1 tablet (1 mg total) by mouth every 12 (twelve) hours.  . magnesium hydroxide (MILK OF MAGNESIA) 400 MG/5ML suspension If no BM in 3 days, give 30 cc Milk of Magnesium p.o. x 1 dose in 24 hours as needed (Do not use standing constipation orders for residents with renal failure CFR less than 30. Contact MD for orders) (Physician Order)  . memantine (NAMENDA) 10 MG tablet Take 10 mg by mouth 2 (two) times daily.   . NON FORMULARY Regular finger foods/thin consistency  . Nutritional Supplement LIQD Take 120 mLs by mouth 2 (two) times a day. MedPass  . ondansetron (ZOFRAN) 4 MG tablet Take 4 mg by mouth every 6 (six) hours as needed for nausea or vomiting.  . pantoprazole (PROTONIX) 40 MG tablet Take 40 mg by mouth daily.   . polyethylene glycol (MIRALAX / GLYCOLAX) packet Take 17 g by mouth daily as needed.   . sodium fluoride (PREVIDENT 5000 PLUS) 1.1 % CREA dental cream Place 1 application onto teeth. BRUSH ON TEETH WITH A TOOTHBRUSH EVERY EVENING AFTER PM MOUTH CARE. SPIT OUT EXCESS AND DO NOT RINSE  . Sodium Phosphates (RA SALINE ENEMA RE) If not relieved by Biscodyl suppository, give disposable Saline Enema rectally X 1 dose/24 hrs as needed (Do not  use constipation standing orders for residents with renal failure/CFR less than 30. Contact MD for orders)(Physician Or  . ziprasidone (GEODON) 80 MG capsule Take 80 mg by mouth 2 (two) times daily.    No facility-administered encounter medications on file as of 01/06/2020.    Review of Systems  GENERAL: No fever, chills or weakness MOUTH and THROAT: Denies oral discomfort, gingival pain or bleeding RESPIRATORY: no cough, SOB, DOE, wheezing, hemoptysis CARDIAC: No chest pain, edema or palpitations GI: No abdominal pain, diarrhea, constipation, heart burn, nausea or vomiting GU: Denies dysuria, frequency, hematuria or discharge NEUROLOGICAL: Denies dizziness, syncope, numbness, or headache PSYCHIATRIC: Denies feelings of depression or anxiety. No report of hallucinations, insomnia, paranoia, or agitation   Immunization History  Administered Date(s) Administered  . Influenza-Unspecified 03/17/2017, 09/09/2017, 08/17/2018, 08/29/2019  . PPD Test 03/24/2017, 06/02/2018, 06/08/2018  . Pneumococcal Polysaccharide-23 03/17/2017, 06/12/2018   Pertinent  Health Maintenance Due  Topic Date  Due  . DEXA SCAN  12/21/2008  . PNA vac Low Risk Adult (2 of 2 - PCV13) 06/13/2019  . INFLUENZA VACCINE  Completed   Fall Risk  05/27/2018 05/14/2017  Falls in the past year? No Yes  Number falls in past yr: - 2 or more  Injury with Fall? - No     Vitals:   01/06/20 1227  BP: 108/67  Pulse: 84  Resp: 18  Temp: 97.8 F (36.6 C)  TempSrc: Oral  SpO2: 94%  Weight: 138 lb 3.2 oz (62.7 kg)  Height: 5\' 5"  (1.651 m)   Body mass index is 23 kg/m.  Physical Exam  GENERAL APPEARANCE: Well nourished. In no acute distress. Normal body habitus SKIN:  Skin is warm and dry.  MOUTH and THROAT: Lips are without lesions. Oral mucosa is moist and without lesions.  RESPIRATORY: Breathing is even & unlabored, BS CTAB CARDIAC: RRR, no murmur,no extra heart sounds, no edema GI: Abdomen soft, normal BS, no  masses, no tenderness NEUROLOGICAL: + tremor. Speech is clear. PSYCHIATRIC:  Affect and behavior are appropriate  Labs reviewed: Recent Labs    04/30/19 0000 12/22/19 0000 12/26/19 0000  NA 144 142 144  K 3.7 3.5 3.9  CL  --  106 106  CO2  --  29* 27*  BUN 16 15 15   CREATININE 0.7 0.6 0.5  CALCIUM  --  9.3 8.7   Recent Labs    04/30/19 0000 12/22/19 0000 12/26/19 0000  AST 12* 13 11*  ALT 6* 7 5*  ALKPHOS 88 65 60  ALBUMIN  --  3.8 3.5   Recent Labs    04/30/19 0000 12/22/19 0000 12/26/19 0000  WBC 6.2 5.1 5.7  NEUTROABS 4  --   --   HGB 13.4 12.6 11.8*  HCT 39 37 33*  PLT 261 256 267   Lab Results  Component Value Date   TSH 4.70 04/30/2019   Lab Results  Component Value Date   HGBA1C 5.4 03/15/2018   Lab Results  Component Value Date   CHOL 139 03/15/2018   HDL 46 03/15/2018   LDLCALC 74 03/15/2018   TRIG 97 03/15/2018    Assessment/Plan  1. Advance care planning - remains to be full code - discussed medications, vital signs and weights  2. Parkinson disease (HCC) - continue Carbidopa-Levodopa, fall precautions  3. 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 Allergic rhinitis, unspecified seasonality, unspecified trigger - stable, continue Claritin and Fluticasone  5. GERD without esophagitis - continue Pantoprazole  6. Schizoaffective disorder, depressive type (HCC) - mood is stable, continue Duloxetine, , Ziprasidone, Lithium carbonate, Buspirone and Ativan  7. Vascular dementia without behavioral disturbance ([facility]CC) - continue Donepezil and Memantine, fall precautions    Family/ staff Communication:  Discussed plan of care with daughter and IDT.  Labs/tests ordered:  None  Goals of care:   Long-term care/Palliative care   03/17/2018, DNP, FNP-BC [facility] [phone] (Monday-Friday 8:00  a.m. - 5:00 p.m.) [phone] (after hours)

## 2020-01-09 ENCOUNTER — Other Ambulatory Visit: Payer: Self-pay

## 2020-01-09 DIAGNOSIS — M6281 Muscle weakness (generalized): Secondary | ICD-10-CM | POA: Diagnosis not present

## 2020-01-09 DIAGNOSIS — G20 Parkinson's disease: Secondary | ICD-10-CM | POA: Diagnosis not present

## 2020-01-09 NOTE — Progress Notes (Signed)
COMMUNITY PALLIATIVE CARE SW NOTE  PATIENT NAME: Jennifer Garner DOB: 11-Feb-1946 MRN: [id number]  PRIMARY CARE PROVIDER: Pecola Lawless, MD  RESPONSIBLE PARTY:  Acct ID - Guarantor Home Phone Work Phone Relationship Acct Type  [id number] Jennifer Garner,* [phone]  Self P/F     [address] [place], [place], [place] [address]   Due to the COVID-19 crisis, this virtual check-in visit was done via telephone from my office and it was initiated and consent given by this patient and or family.   PLAN OF CARE and INTERVENTIONS:             1. GOALS OF CARE/ ADVANCE CARE PLANNING:  Goal of daughter is to be able to obtain updates on patient through the SNF.  She has a MOST form. 2. SOCIAL/EMOTIONAL/SPIRITUAL ASSESSMENT/ INTERVENTIONS:  SW conducted a Biomedical engineer visit with patient's daughter, Hali Marry Juliette Alcide).  She stated she was the person responsible for requesting Palliative Care become involved with her mother.  Since she has not been able to visit with patient, and was hoping for additional assessment of patient.  She has had difficulty obtaining updates regarding her mother's condition.  She stated her mother has lost weight.  Mindy moved patient from [place] in 2018.  She feels her mother is sad. 3. PATIENT/CAREGIVER EDUCATION/ COPING:  Daughter copes by problem-solving. 4. PERSONAL EMERGENCY PLAN:  Per facility protocol. 5. COMMUNITY RESOURCES COORDINATION/ HEALTH CARE NAVIGATION:  None. 6. FINANCIAL/LEGAL CONCERNS/INTERVENTIONS:  None.     SOCIAL HX:  Social History   Tobacco Use  . Smoking status: Never Smoker  . Smokeless tobacco: Never Used  Substance Use Topics  . Alcohol use: No    CODE STATUS:  Full Code  ADVANCED DIRECTIVES: N MOST FORM COMPLETE:  Y HOSPICE EDUCATION PROVIDED: N Duration of visit and documentation:  30 minutes.      Vella Kohler Kacyn Souder, LCSW

## 2020-01-10 DIAGNOSIS — M6281 Muscle weakness (generalized): Secondary | ICD-10-CM | POA: Diagnosis not present

## 2020-01-10 DIAGNOSIS — G20 Parkinson's disease: Secondary | ICD-10-CM | POA: Diagnosis not present

## 2020-01-11 DIAGNOSIS — M6281 Muscle weakness (generalized): Secondary | ICD-10-CM | POA: Diagnosis not present

## 2020-01-11 DIAGNOSIS — G20 Parkinson's disease: Secondary | ICD-10-CM | POA: Diagnosis not present

## 2020-01-12 DIAGNOSIS — G20 Parkinson's disease: Secondary | ICD-10-CM | POA: Diagnosis not present

## 2020-01-12 DIAGNOSIS — M6281 Muscle weakness (generalized): Secondary | ICD-10-CM | POA: Diagnosis not present

## 2020-01-13 DIAGNOSIS — M6281 Muscle weakness (generalized): Secondary | ICD-10-CM | POA: Diagnosis not present

## 2020-01-13 DIAGNOSIS — G20 Parkinson's disease: Secondary | ICD-10-CM | POA: Diagnosis not present

## 2020-01-16 DIAGNOSIS — M6281 Muscle weakness (generalized): Secondary | ICD-10-CM | POA: Diagnosis not present

## 2020-01-16 DIAGNOSIS — R1311 Dysphagia, oral phase: Secondary | ICD-10-CM | POA: Diagnosis not present

## 2020-01-16 DIAGNOSIS — R1312 Dysphagia, oropharyngeal phase: Secondary | ICD-10-CM | POA: Diagnosis not present

## 2020-01-16 DIAGNOSIS — G20 Parkinson's disease: Secondary | ICD-10-CM | POA: Diagnosis not present

## 2020-01-17 DIAGNOSIS — R1311 Dysphagia, oral phase: Secondary | ICD-10-CM | POA: Diagnosis not present

## 2020-01-17 DIAGNOSIS — R1312 Dysphagia, oropharyngeal phase: Secondary | ICD-10-CM | POA: Diagnosis not present

## 2020-01-17 DIAGNOSIS — M6281 Muscle weakness (generalized): Secondary | ICD-10-CM | POA: Diagnosis not present

## 2020-01-17 DIAGNOSIS — G20 Parkinson's disease: Secondary | ICD-10-CM | POA: Diagnosis not present

## 2020-01-18 DIAGNOSIS — R1311 Dysphagia, oral phase: Secondary | ICD-10-CM | POA: Diagnosis not present

## 2020-01-18 DIAGNOSIS — G20 Parkinson's disease: Secondary | ICD-10-CM | POA: Diagnosis not present

## 2020-01-18 DIAGNOSIS — M6281 Muscle weakness (generalized): Secondary | ICD-10-CM | POA: Diagnosis not present

## 2020-01-18 DIAGNOSIS — R1312 Dysphagia, oropharyngeal phase: Secondary | ICD-10-CM | POA: Diagnosis not present

## 2020-01-19 DIAGNOSIS — R1312 Dysphagia, oropharyngeal phase: Secondary | ICD-10-CM | POA: Diagnosis not present

## 2020-01-19 DIAGNOSIS — G20 Parkinson's disease: Secondary | ICD-10-CM | POA: Diagnosis not present

## 2020-01-19 DIAGNOSIS — M6281 Muscle weakness (generalized): Secondary | ICD-10-CM | POA: Diagnosis not present

## 2020-01-19 DIAGNOSIS — R1311 Dysphagia, oral phase: Secondary | ICD-10-CM | POA: Diagnosis not present

## 2020-01-20 DIAGNOSIS — M6281 Muscle weakness (generalized): Secondary | ICD-10-CM | POA: Diagnosis not present

## 2020-01-20 DIAGNOSIS — R1311 Dysphagia, oral phase: Secondary | ICD-10-CM | POA: Diagnosis not present

## 2020-01-20 DIAGNOSIS — R1312 Dysphagia, oropharyngeal phase: Secondary | ICD-10-CM | POA: Diagnosis not present

## 2020-01-20 DIAGNOSIS — G20 Parkinson's disease: Secondary | ICD-10-CM | POA: Diagnosis not present

## 2020-01-21 DIAGNOSIS — R1311 Dysphagia, oral phase: Secondary | ICD-10-CM | POA: Diagnosis not present

## 2020-01-21 DIAGNOSIS — M6281 Muscle weakness (generalized): Secondary | ICD-10-CM | POA: Diagnosis not present

## 2020-01-21 DIAGNOSIS — G20 Parkinson's disease: Secondary | ICD-10-CM | POA: Diagnosis not present

## 2020-01-21 DIAGNOSIS — R1312 Dysphagia, oropharyngeal phase: Secondary | ICD-10-CM | POA: Diagnosis not present

## 2020-01-23 DIAGNOSIS — M6281 Muscle weakness (generalized): Secondary | ICD-10-CM | POA: Diagnosis not present

## 2020-01-23 DIAGNOSIS — R1311 Dysphagia, oral phase: Secondary | ICD-10-CM | POA: Diagnosis not present

## 2020-01-23 DIAGNOSIS — R1312 Dysphagia, oropharyngeal phase: Secondary | ICD-10-CM | POA: Diagnosis not present

## 2020-01-23 DIAGNOSIS — G20 Parkinson's disease: Secondary | ICD-10-CM | POA: Diagnosis not present

## 2020-01-24 DIAGNOSIS — R1311 Dysphagia, oral phase: Secondary | ICD-10-CM | POA: Diagnosis not present

## 2020-01-24 DIAGNOSIS — M6281 Muscle weakness (generalized): Secondary | ICD-10-CM | POA: Diagnosis not present

## 2020-01-24 DIAGNOSIS — R1312 Dysphagia, oropharyngeal phase: Secondary | ICD-10-CM | POA: Diagnosis not present

## 2020-01-24 DIAGNOSIS — G20 Parkinson's disease: Secondary | ICD-10-CM | POA: Diagnosis not present

## 2020-01-25 DIAGNOSIS — G20 Parkinson's disease: Secondary | ICD-10-CM | POA: Diagnosis not present

## 2020-01-25 DIAGNOSIS — R1311 Dysphagia, oral phase: Secondary | ICD-10-CM | POA: Diagnosis not present

## 2020-01-25 DIAGNOSIS — M6281 Muscle weakness (generalized): Secondary | ICD-10-CM | POA: Diagnosis not present

## 2020-01-25 DIAGNOSIS — R1312 Dysphagia, oropharyngeal phase: Secondary | ICD-10-CM | POA: Diagnosis not present

## 2020-01-26 DIAGNOSIS — R1311 Dysphagia, oral phase: Secondary | ICD-10-CM | POA: Diagnosis not present

## 2020-01-26 DIAGNOSIS — R1312 Dysphagia, oropharyngeal phase: Secondary | ICD-10-CM | POA: Diagnosis not present

## 2020-01-26 DIAGNOSIS — G20 Parkinson's disease: Secondary | ICD-10-CM | POA: Diagnosis not present

## 2020-01-26 DIAGNOSIS — M6281 Muscle weakness (generalized): Secondary | ICD-10-CM | POA: Diagnosis not present

## 2020-01-27 DIAGNOSIS — M6281 Muscle weakness (generalized): Secondary | ICD-10-CM | POA: Diagnosis not present

## 2020-01-27 DIAGNOSIS — R1312 Dysphagia, oropharyngeal phase: Secondary | ICD-10-CM | POA: Diagnosis not present

## 2020-01-27 DIAGNOSIS — G20 Parkinson's disease: Secondary | ICD-10-CM | POA: Diagnosis not present

## 2020-01-27 DIAGNOSIS — R1311 Dysphagia, oral phase: Secondary | ICD-10-CM | POA: Diagnosis not present

## 2020-01-30 DIAGNOSIS — R1311 Dysphagia, oral phase: Secondary | ICD-10-CM | POA: Diagnosis not present

## 2020-01-30 DIAGNOSIS — R1312 Dysphagia, oropharyngeal phase: Secondary | ICD-10-CM | POA: Diagnosis not present

## 2020-01-30 DIAGNOSIS — Z23 Encounter for immunization: Secondary | ICD-10-CM | POA: Diagnosis not present

## 2020-01-30 DIAGNOSIS — M6281 Muscle weakness (generalized): Secondary | ICD-10-CM | POA: Diagnosis not present

## 2020-01-30 DIAGNOSIS — G20 Parkinson's disease: Secondary | ICD-10-CM | POA: Diagnosis not present

## 2020-01-31 DIAGNOSIS — R1311 Dysphagia, oral phase: Secondary | ICD-10-CM | POA: Diagnosis not present

## 2020-01-31 DIAGNOSIS — G20 Parkinson's disease: Secondary | ICD-10-CM | POA: Diagnosis not present

## 2020-01-31 DIAGNOSIS — M6281 Muscle weakness (generalized): Secondary | ICD-10-CM | POA: Diagnosis not present

## 2020-01-31 DIAGNOSIS — R1312 Dysphagia, oropharyngeal phase: Secondary | ICD-10-CM | POA: Diagnosis not present

## 2020-02-01 DIAGNOSIS — M6281 Muscle weakness (generalized): Secondary | ICD-10-CM | POA: Diagnosis not present

## 2020-02-01 DIAGNOSIS — G20 Parkinson's disease: Secondary | ICD-10-CM | POA: Diagnosis not present

## 2020-02-01 DIAGNOSIS — R1311 Dysphagia, oral phase: Secondary | ICD-10-CM | POA: Diagnosis not present

## 2020-02-01 DIAGNOSIS — R1312 Dysphagia, oropharyngeal phase: Secondary | ICD-10-CM | POA: Diagnosis not present

## 2020-02-02 DIAGNOSIS — M6281 Muscle weakness (generalized): Secondary | ICD-10-CM | POA: Diagnosis not present

## 2020-02-02 DIAGNOSIS — G20 Parkinson's disease: Secondary | ICD-10-CM | POA: Diagnosis not present

## 2020-02-02 DIAGNOSIS — R1312 Dysphagia, oropharyngeal phase: Secondary | ICD-10-CM | POA: Diagnosis not present

## 2020-02-02 DIAGNOSIS — R1311 Dysphagia, oral phase: Secondary | ICD-10-CM | POA: Diagnosis not present

## 2020-02-03 DIAGNOSIS — G20 Parkinson's disease: Secondary | ICD-10-CM | POA: Diagnosis not present

## 2020-02-03 DIAGNOSIS — R1312 Dysphagia, oropharyngeal phase: Secondary | ICD-10-CM | POA: Diagnosis not present

## 2020-02-03 DIAGNOSIS — R1311 Dysphagia, oral phase: Secondary | ICD-10-CM | POA: Diagnosis not present

## 2020-02-03 DIAGNOSIS — M6281 Muscle weakness (generalized): Secondary | ICD-10-CM | POA: Diagnosis not present

## 2020-02-06 ENCOUNTER — Other Ambulatory Visit: Payer: Self-pay

## 2020-02-06 ENCOUNTER — Telehealth: Payer: Self-pay

## 2020-02-06 ENCOUNTER — Non-Acute Institutional Stay: Payer: Medicare Other

## 2020-02-06 ENCOUNTER — Other Ambulatory Visit: Payer: Self-pay | Admitting: Adult Health

## 2020-02-06 VITALS — Wt 140.0 lb

## 2020-02-06 DIAGNOSIS — Z515 Encounter for palliative care: Secondary | ICD-10-CM

## 2020-02-06 DIAGNOSIS — R1312 Dysphagia, oropharyngeal phase: Secondary | ICD-10-CM | POA: Diagnosis not present

## 2020-02-06 DIAGNOSIS — R1311 Dysphagia, oral phase: Secondary | ICD-10-CM | POA: Diagnosis not present

## 2020-02-06 DIAGNOSIS — M6281 Muscle weakness (generalized): Secondary | ICD-10-CM | POA: Diagnosis not present

## 2020-02-06 DIAGNOSIS — G20 Parkinson's disease: Secondary | ICD-10-CM | POA: Diagnosis not present

## 2020-02-06 MED ORDER — LORAZEPAM 1 MG PO TABS: 1.0000 mg | ORAL_TABLET | Freq: Two times a day (BID) | ORAL | 0 refills | Status: DC

## 2020-02-06 NOTE — Telephone Encounter (Signed)
Received message from patient's daughter who requested a return call. Returned call, VM left

## 2020-02-06 NOTE — Telephone Encounter (Signed)
Received return call from patient's daughter. Daughter shared that the patient has been reporting some pain while up in the w/c. Daughter would like the patient to be able to be in the w/c but not stay up for long periods of time.

## 2020-02-07 ENCOUNTER — Encounter: Payer: Self-pay | Admitting: Internal Medicine

## 2020-02-07 ENCOUNTER — Non-Acute Institutional Stay (SKILLED_NURSING_FACILITY): Payer: Medicare Other | Admitting: Internal Medicine

## 2020-02-07 DIAGNOSIS — M6281 Muscle weakness (generalized): Secondary | ICD-10-CM | POA: Diagnosis not present

## 2020-02-07 DIAGNOSIS — F0150 Vascular dementia without behavioral disturbance: Secondary | ICD-10-CM | POA: Diagnosis not present

## 2020-02-07 DIAGNOSIS — G20 Parkinson's disease: Secondary | ICD-10-CM

## 2020-02-07 DIAGNOSIS — D649 Anemia, unspecified: Secondary | ICD-10-CM | POA: Diagnosis not present

## 2020-02-07 DIAGNOSIS — R1311 Dysphagia, oral phase: Secondary | ICD-10-CM | POA: Diagnosis not present

## 2020-02-07 DIAGNOSIS — R1312 Dysphagia, oropharyngeal phase: Secondary | ICD-10-CM | POA: Diagnosis not present

## 2020-02-07 NOTE — Assessment & Plan Note (Signed)
No bleeding dyscrasias reported but patient's history is unreliable.  Because of her multiple comorbidities and polypharmacy, she is at higher risk for invasive procedures.  CBC will be monitored.

## 2020-02-07 NOTE — Progress Notes (Signed)
NURSING HOME LOCATION:  [facility] ROOM NUMBER:  225/B  CODE STATUS:  Full Code  PCP:  Hendricks Limes MD.  This is a nursing facility follow up of chronic medical diagnoses  Interim medical record and care since last [facility] visit was updated with review of diagnostic studies and change in clinical status since last visit were documented.  HPI: She is a permanent resident of the facility with medical diagnoses of history of TIA and stroke, Parkinson's disease with hallucinations, dyslipidemia, GERD, fibromyalgia, dementia, and history of asthma.  Labs are current as of 12/26/2019.  There are no significant abnormalities except for slight progression of anemia.  On 2/4 hemoglobin 12.6/hematocrit 37; values on 2/8 were 11.8/33.  As noted she has a history of GERD and is on a PPI. She is on multiple psychotropic agents for her dementia and Parkinson's with history of hallucinations.  Review of systems: Dementia invalidated responses.  She can provide no meaningful history.  She cannot even give me the year.  When asked how she was doing response was "just sick".  She also said that "I cannot walk".  She was unable to explain why she could not walk.  Her other complaint was that she could not get a toothbrush and had not brushed her teeth in over a week.  She denied bleeding dyscrasias or other active symptoms.  Constitutional: No fever, significant weight change, fatigue  Eyes: No redness, discharge, pain, vision change ENT/mouth: No nasal congestion,  purulent discharge, earache, change in hearing, sore throat  Cardiovascular: No chest pain, palpitations, paroxysmal nocturnal dyspnea, claudication, edema  Respiratory: No cough, sputum production, hemoptysis, DOE, significant snoring, apnea   Gastrointestinal: No heartburn, dysphagia, abdominal pain, nausea /vomiting, rectal bleeding, melena, change in bowels Genitourinary: No dysuria, hematuria, pyuria, incontinence,  nocturia Musculoskeletal: No joint stiffness, joint swelling, weakness, pain Dermatologic: No rash, pruritus, change in appearance of skin Neurologic: No dizziness, headache, syncope, seizures, numbness, tingling Psychiatric: No significant anxiety, depression, insomnia, anorexia Endocrine: No change in hair/skin/nails, excessive thirst, excessive hunger, excessive urination  Hematologic/lymphatic: No significant bruising, lymphadenopathy, abnormal bleeding  Physical exam:  Pertinent or positive findings: She appears somewhat chronically ill and is pale.  Facies are blank.  There is asymmetry of the nasolabial fold with the right being decreased. Teeth are coated. She exhibits slight torticollis with her head tilted to the right.  Trace edema at the sock line was noted.  When the feet are stimulated the toes are upgoing.  She exhibits a slight jerking tremor greater on the right hand than the left.  It is not pill-rolling.  General appearance:  no acute distress, increased work of breathing is present.   Lymphatic: No lymphadenopathy about the head, neck, axilla. Eyes: No conjunctival inflammation or lid edema is present. There is no scleral icterus. Ears:  External ear exam shows no significant lesions or deformities.   Nose:  External nasal examination shows no deformity or inflammation. Nasal mucosa are pink and moist without lesions, exudates Oral exam:  Lips and gums are healthy appearing. Neck:  No thyromegaly, masses, tenderness noted.    Heart:  Normal rate and regular rhythm. S1 and S2 normal without gallop, murmur, click, rub .  Lungs: Chest clear to auscultation without wheezes, rhonchi, rales, rubs. Abdomen: Bowel sounds are normal. Abdomen is soft and nontender with no organomegaly, hernias, masses. GU: Deferred  Extremities:  No cyanosis, clubbing  Neurologic exam : Balance, Rhomberg, finger to nose testing could not  be completed due to clinical state Skin: Warm & dry w/o  tenting. No significant lesions or rash.  See summary under each active problem in the Problem List with associated updated therapeutic plan

## 2020-02-07 NOTE — Assessment & Plan Note (Signed)
Remotely when I discontinued the parkinsonian medications there was dramatic decompensation.  Neurology assessment indicated.

## 2020-02-07 NOTE — Patient Instructions (Signed)
See assessment and plan under each diagnosis in the problem list and acutely for this visit 

## 2020-02-07 NOTE — Assessment & Plan Note (Addendum)
Neurologic follow-up clinically indicated & will be scheduled if her daughter,HCPOA  Agrees, as quarantine has been lifted.

## 2020-02-08 DIAGNOSIS — M6281 Muscle weakness (generalized): Secondary | ICD-10-CM | POA: Diagnosis not present

## 2020-02-08 DIAGNOSIS — R1312 Dysphagia, oropharyngeal phase: Secondary | ICD-10-CM | POA: Diagnosis not present

## 2020-02-08 DIAGNOSIS — G20 Parkinson's disease: Secondary | ICD-10-CM | POA: Diagnosis not present

## 2020-02-08 DIAGNOSIS — R1311 Dysphagia, oral phase: Secondary | ICD-10-CM | POA: Diagnosis not present

## 2020-02-08 NOTE — Progress Notes (Signed)
Community Palliative Care Note:   PATIENT NAME: Jennifer Garner DOB: 12-19-1945 MRN: [id number]  PRIMARY CARE PROVIDER: Pecola Lawless, MD  Responsible Party;  RESPONSIBLE PARTY:  Acct ID - Guarantor Home Phone Work Phone Relationship Acct Type  [id number] Anola Mcgough,* [phone]  Self P/F     [address] [place], [place] [address]   VIRTUAL CHECK-IN VISIT  STATEMENT Due to the COVID-19 crisis, this virtual check-in visit was done via telephone from my office and it was initiated and consent by this patient and or family.  PHYSICAL EXAM:   VITALS: Today's Vitals   02/08/20 1113  Weight: 140 lb (63.5 kg)    RESPONSIBLE PARTY:    PLAN OF CARE and INTERVENTION:  1. ADVANCE CARE PLANNING/GOALS OF CARE: Daughter requested to have updates after Palliative Care Visits. Patient to remain comfortable, clarified further to include that patient be assisted back to bed and not kept up in w/c for extended period of time 7 for improved pain management. Daughter has MOST form. Remains a Full Code 2. PATIENT/CAREGIVER EDUCATION: Reviewed role of Palliative Care with daughter Juliette Alcide).  3. DISEASE STATUS: Due to the Covid-19 crisis, virtual visit completed via telephone from my office with facility caregivers, PCP and with patient's daughter, Juliette Alcide. Juliette Alcide voiced concerns that patient is having pain in her lower back, especially when she is up in the wheelchair for an extended period. Daughter shared that she feels there is a decline in patient's functional and nutritional  status related to her disease processes. Daughter noted that patient requires assistance with her ADLS. She is unable to feed herself with out assistance from the facility caregivers due to tremors. Daughter feels that patient has lost weight over the past year and reports her current weight at 140 pounds.    HISTORY OF PRESENT ILLNESS:  This is a 74 year old female who resides at [facility]. Per  review of documentation, past medical history include stroke, TIA, Parkinson's Disease with hallucinations,dementia, GERD and fibromyalgia. Spoke with facility nurse to complete virtual check in and to collaborate regarding patient's care and daughter's expressed concerns. Per report, patient is assisted out of bed to wheel chair after breakfast and is up out of wheelchair only for lunch. Meal intake averages about 25% per meal.  CODE STATUS: Full Code MOST FORM: Y PPS: 30%    PHYSICAL EXAM: deferred due to telehealth  Duration of Visit and Documentation: 60 minu       Estanislado Pandy, RN

## 2020-03-05 ENCOUNTER — Other Ambulatory Visit: Payer: Self-pay | Admitting: Adult Health

## 2020-03-05 MED ORDER — LORAZEPAM 1 MG PO TABS: 1.0000 mg | ORAL_TABLET | Freq: Two times a day (BID) | ORAL | 0 refills | Status: DC

## 2020-03-09 ENCOUNTER — Other Ambulatory Visit: Payer: Self-pay

## 2020-03-09 ENCOUNTER — Non-Acute Institutional Stay: Payer: Medicare Other

## 2020-03-09 ENCOUNTER — Telehealth: Payer: Self-pay

## 2020-03-09 DIAGNOSIS — Z515 Encounter for palliative care: Secondary | ICD-10-CM

## 2020-03-09 NOTE — Telephone Encounter (Signed)
SW left a message for patient's daughter-[name], extending support to her and requesting a call back.

## 2020-03-10 NOTE — Progress Notes (Signed)
COMMUNITY PALLIATIVE CARE SW NOTE  PATIENT NAME: Jennifer Garner DOB: 1946/03/06 MRN: [id number]  PRIMARY CARE PROVIDER: Pecola Lawless, MD  RESPONSIBLE PARTY:  Acct ID - Guarantor Home Phone Work Phone Relationship Acct Type  [id number] Christel Mormon,* [phone]  Self P/F     [address] [place], [place] [address]     PLAN OF CARE and INTERVENTIONS:             1. GOALS OF CARE/ ADVANCE CARE PLANNING: Patient is a DNR, document on facility chart 2. SOCIAL/EMOTIONAL/SPIRITUAL ASSESSMENT/ INTERVENTIONS: SW completed face-to-face visit with patient at the facility [facility]). Patient was sitting up in bed,sipping on water. She denied pain. Patient was alert and oriented to self and situation. Her affect was flat and verbalizations were delayed, but she was engaged with SW. SW engaged patient around the pictures of family members in her room. She advised that her daughter would be visiting with her Saturday. Patient reported no significant changes in her condition or any coping issues. SW consulted with the facility nurse, who advised that patient had a week where she would not feed herself. Staff feed her all meals, but there was a decrease in her intake or interest in food during this time. Patient has started back feeding herself and taking her own liquids.  3. PATIENT/CAREGIVER EDUCATION/ COPING: Patient is alert and oriented to self and situation-she can make her needs known. She seemed receptive and openly engaged with SW. The staff expressed concern regarding patient's daughter coping. SW has left a message for the daughter, extending support and requesting a call back. SW reinforced support of palliative care team to patient and facility staff. 4. PERSONAL EMERGENCY PLAN: Patient's primary care physician can be contacted for any changes in patient condition. 911 can be activated for emergencies.   5. COMMUNITY RESOURCES COORDINATION/ HEALTH CARE NAVIGATION: Patient has access to the  activities program for social stimulation. SW will continue to reach out to the [facility] to offer support to her.  6. FINANCIAL/LEGAL CONCERNS/INTERVENTIONS:  No financial or legal issues     SOCIAL HX:  Social History   Tobacco Use  . Smoking status: Never Smoker  . Smokeless tobacco: Never Used  Substance Use Topics  . Alcohol use: No    CODE STATUS:   Code Status: Not on file DNR ADVANCED DIRECTIVES: N MOST FORM COMPLETE:  No HOSPICE EDUCATION PROVIDED: NO  [id number] bedbound, occasionally getting up in her to her broada chair. She dependent for personal care needs, but is able to fed herself.   SW spent 60 minutes with patient and facility staff providing assessing needs and comfort of patient, supportive presence, active listening, reassurance of support and consult with staff.       [address] [place], [place]

## 2020-03-12 ENCOUNTER — Encounter: Payer: Self-pay | Admitting: Adult Health

## 2020-03-12 ENCOUNTER — Non-Acute Institutional Stay (SKILLED_NURSING_FACILITY): Payer: Medicare Other | Admitting: Adult Health

## 2020-03-12 DIAGNOSIS — Z79899 Other long term (current) drug therapy: Secondary | ICD-10-CM | POA: Diagnosis not present

## 2020-03-12 NOTE — Progress Notes (Signed)
Location:  [facility] Room Number: 225-B Place of Service:  SNF (31) Provider:  Kenard Gower, DNP, FNP-BC  Patient Care Team: Pecola Lawless, MD as PCP - General (Internal Medicine) Center, [facility] (Skilled Nursing Facility) Synetta Shadow as Physician Assistant (Internal Medicine)  Extended Emergency Contact Information Primary Emergency Contact: [name] of [place] Home Phone: [phone] Relation: Daughter Secondary Emergency Contact: [name] States of [place] Home Phone: [phone] Relation: Daughter  Code Status:  Full Code Goals of care: Advanced Directive information Advanced Directives 02/07/2020  Does Patient Have a Medical Advance Directive? Yes  Type of Advance Directive (No Data)  Does patient want to make changes to medical advance directive? No - Patient declined  Would patient like information on creating a medical advance directive? -  Pre-existing out of facility DNR order (yellow form or pink MOST form) Pink MOST form placed in chart (order not valid for inpatient use)     Chief Complaint  Patient presents with  . Acute Visit    Patient is seen for medication management    HPI:  Pt is a 74 y.o. female seen for medication management.  She is a long-term care resident of [facility].  She has a PMH of TIA, stroke, Parkinson's disease with hallucinations, dyslipidemia, GERD, fibromyalgia, dementia and history of asthma.  She was seen in the room today.  She denies having any complaints.  Several of her PRN medications has not been used >90 days.  These medications are loperamide, calcium antacid, Zofran and Claritin.   Past Medical History:  Diagnosis Date  . Allergy   . Anxiety   . Arthritis   . Asthma   . Benign essential hypertension 03/18/2017  . Cataract   . Dementia (HCC)   . Depression   . Difficulty walking   . Dysphagia, oral phase   .  Fibromyalgia   . GERD (gastroesophageal reflux disease)   . Hallucinations   . Hyperlipidemia   . Lower extremity edema   . Parkinson disease (HCC)   . Repeated falls   . Rhabdomyolysis   . Stroke (HCC)   . TIA (transient ischemic attack)   . Urinary incontinence    Past Surgical History:  Procedure Laterality Date  . ABDOMINAL HYSTERECTOMY    . CATARACT EXTRACTION    . CHOLECYSTECTOMY    . TONSILLECTOMY AND ADENOIDECTOMY      Allergies  Allergen Reactions  . Hydrocodone Other (See Comments)    GI upset    Outpatient Encounter Medications as of 03/12/2020  Medication Sig  . acetaminophen (TYLENOL) 325 MG tablet Take 650 mg by mouth every 6 (six) hours as needed for mild pain or moderate pain.  Marland Kitchen albuterol (PROVENTIL HFA;VENTOLIN HFA) 108 (90 Base) MCG/ACT inhaler Inhale 2 puffs into the lungs every 6 (six) hours as needed for wheezing or shortness of breath.  Marland Kitchen aspirin EC 81 MG tablet Take 81 mg by mouth daily.  . bisacodyl (BISACODYL LAXATIVE) 10 MG suppository Place 10 mg rectally as needed for moderate constipation.  . busPIRone (BUSPAR) 15 MG tablet Take 15 mg by mouth 3 (three) times daily.  . calcium carbonate (TUMS - DOSED IN MG ELEMENTAL CALCIUM) 500 MG chewable tablet Chew 1 tablet by mouth every 6 (six) hours as needed for indigestion or heartburn.  . carbidopa-levodopa (SINEMET IR) 25-100 MG tablet Take 1 tablet by mouth 3 (three) times daily.  . cholecalciferol (VITAMIN D) 1000  units tablet Take 1,000 Units by mouth daily.   Marland Kitchen donepezil (ARICEPT) 10 MG tablet Take 10 mg by mouth at bedtime.   . DULoxetine (CYMBALTA) 30 MG capsule Give 3 tablets (90mg ) by mouth daily  . fluticasone (FLONASE) 50 MCG/ACT nasal spray Place 2 sprays into both nostrils daily.   . Lidocaine (SALONPAS PAIN RELIEVING) 4 % PTCH APPLY 2 PATCHES TOPICALLY TO LOWER BACK ONCE DAILY FOR PAIN (REMOVE AT BEDTIME)  . lithium carbonate 300 MG capsule Take 300 mg by mouth at bedtime.  Marland Kitchen loperamide  (IMODIUM) 2 MG capsule Take 2 mg by mouth every 6 (six) hours as needed for diarrhea or loose stools.  . Loratadine (CLARITIN REDITABS) 5 MG TBDP Take 10 mg by mouth daily as needed.   Marland Kitchen LORazepam (ATIVAN) 1 MG tablet Take 1 tablet (1 mg total) by mouth every 12 (twelve) hours.  . magnesium hydroxide (MILK OF MAGNESIA) 400 MG/5ML suspension If no BM in 3 days, give 30 cc Milk of Magnesium p.o. x 1 dose in 24 hours as needed (Do not use standing constipation orders for residents with renal failure CFR less than 30. Contact MD for orders) (Physician Order)  . memantine (NAMENDA) 10 MG tablet Take 10 mg by mouth 2 (two) times daily.   . NON FORMULARY Regular /thin diet consistency  . Nutritional Supplement LIQD Take 120 mLs by mouth 3 (three) times daily. MedPass   . ondansetron (ZOFRAN) 4 MG tablet Take 4 mg by mouth every 6 (six) hours as needed for nausea or vomiting.  . pantoprazole (PROTONIX) 40 MG tablet Take 40 mg by mouth daily.   . polyethylene glycol (MIRALAX / GLYCOLAX) packet Take 17 g by mouth daily as needed.   . sodium fluoride (PREVIDENT 5000 PLUS) 1.1 % CREA dental cream Place 1 application onto teeth. BRUSH ON TEETH WITH A TOOTHBRUSH EVERY EVENING AFTER PM MOUTH CARE. SPIT OUT EXCESS AND DO NOT RINSE  . Sodium Phosphates (RA SALINE ENEMA RE) If not relieved by Biscodyl suppository, give disposable Saline Enema rectally X 1 dose/24 hrs as needed (Do not use constipation standing orders for residents with renal failure/CFR less than 30. Contact MD for orders)(Physician Or  . ziprasidone (GEODON) 80 MG capsule Take 80 mg by mouth 2 (two) times daily.    No facility-administered encounter medications on file as of 03/12/2020.    Review of Systems  GENERAL: No change in appetite, no fatigue, no weight changes, no fever, chills or weakness MOUTH and THROAT: Denies oral discomfort, gingival pain or bleeding RESPIRATORY: no cough, SOB, DOE, wheezing, hemoptysis CARDIAC: No chest pain,  edema or palpitations GI: No abdominal pain, diarrhea, constipation, heart burn, nausea or vomiting GU: Denies dysuria, frequency, hematuria or discharge NEUROLOGICAL: Denies dizziness, syncope, numbness, or headache PSYCHIATRIC: Denies feelings of depression or anxiety. No report of hallucinations, insomnia, paranoia, or agitation   Immunization History  Administered Date(s) Administered  . Influenza-Unspecified 03/17/2017, 09/09/2017, 08/17/2018, 08/29/2019  . PPD Test 03/24/2017, 06/02/2018, 06/08/2018  . Pneumococcal Polysaccharide-23 03/17/2017, 06/12/2018   Pertinent  Health Maintenance Due  Topic Date Due  . DEXA SCAN  Never done  . PNA vac Low Risk Adult (2 of 2 - PCV13) 06/13/2019  . INFLUENZA VACCINE  06/17/2020   Fall Risk  05/27/2018 05/14/2017  Falls in the past year? No Yes  Number falls in past yr: - 2 or more  Injury with Fall? - No     Vitals:   03/12/20 1533  BP: 100/60  Pulse: 70  Resp: 18  Temp: 97.9 F (36.6 C)  TempSrc: Oral  Weight: 136 lb 6.4 oz (61.9 kg)  Height: 5\' 5"  (1.651 m)   Body mass index is 22.7 kg/m.  Physical Exam  GENERAL APPEARANCE: Well nourished. In no acute distress. Normal body habitus SKIN:  Skin is warm and dry.  MOUTH and THROAT: Lips are without lesions. Oral mucosa is moist and without lesions. Tongue is normal in shape, size, and color and without lesions RESPIRATORY: Breathing is even & unlabored, BS CTAB CARDIAC: RRR, no murmur,no extra heart sounds, no edema GI: Abdomen soft, normal BS, no masses, no tenderness NEUROLOGICAL: + tremors. Speech is clear. PSYCHIATRIC:  Affect and behavior are appropriate  Labs reviewed: Recent Labs    04/30/19 0000 12/22/19 0000 12/26/19 0000  NA 144 142 144  K 3.7 3.5 3.9  CL  --  106 106  CO2  --  29* 27*  BUN 16 15 15   CREATININE 0.7 0.6 0.5  CALCIUM  --  9.3 8.7   Recent Labs    04/30/19 0000 12/22/19 0000 12/26/19 0000  AST 12* 13 11*  ALT 6* 7 5*  ALKPHOS 88 65  60  ALBUMIN  --  3.8 3.5   Recent Labs    04/30/19 0000 12/22/19 0000 12/26/19 0000  WBC 6.2 5.1 5.7  NEUTROABS 4  --   --   HGB 13.4 12.6 11.8*  HCT 39 37 33*  PLT 261 256 267   Lab Results  Component Value Date   TSH 4.70 04/30/2019   Lab Results  Component Value Date   HGBA1C 5.4 03/15/2018   Lab Results  Component Value Date   CHOL 139 03/15/2018   HDL 46 03/15/2018   LDLCALC 74 03/15/2018   TRIG 97 03/15/2018    Assessment/Plan  1. Medication management -Will discontinue PRN loperamide, calcium antacid, Zofran and Claritin due to nonuse >90 days   Family/ staff Communication: Discussed plan of care with resident and charge nurse.  Labs/tests ordered: None  Goals of care:   Long-term care   03/17/2018, DNP, FNP-BC [facility] [phone] (Monday-Friday 8:00 a.m. - 5:00 p.m.) [phone] (after hours)

## 2020-03-14 ENCOUNTER — Encounter: Payer: Self-pay | Admitting: Adult Health

## 2020-03-14 ENCOUNTER — Non-Acute Institutional Stay (SKILLED_NURSING_FACILITY): Payer: Medicare Other | Admitting: Adult Health

## 2020-03-14 DIAGNOSIS — F251 Schizoaffective disorder, depressive type: Secondary | ICD-10-CM | POA: Diagnosis not present

## 2020-03-14 DIAGNOSIS — J309 Allergic rhinitis, unspecified: Secondary | ICD-10-CM

## 2020-03-14 DIAGNOSIS — K219 Gastro-esophageal reflux disease without esophagitis: Secondary | ICD-10-CM | POA: Diagnosis not present

## 2020-03-14 DIAGNOSIS — G20 Parkinson's disease: Secondary | ICD-10-CM | POA: Diagnosis not present

## 2020-03-14 NOTE — Progress Notes (Signed)
Location:  [facility] Room Number: 225-B Place of Service:  SNF (31) Provider:  Kenard Gower, DNP, FNP-BC  Patient Care Team: Pecola Lawless, MD as PCP - General (Internal Medicine) Center, [facility] ([facility]) Synetta Shadow as Physician Assistant (Internal Medicine)  Extended Emergency Contact Information Primary Emergency Contact: [name] of [place] Home Phone: [phone] Relation: Daughter Secondary Emergency Contact: [name] [place] of [place] Home Phone: [phone] Relation: Daughter  Code Status:  Full Code  Goals of care: Advanced Directive information Advanced Directives 02/07/2020  Does Patient Have a Medical Advance Directive? Yes  Type of Advance Directive (No Data)  Does patient want to make changes to medical advance directive? No - Patient declined  Would patient like information on creating a medical advance directive? -  Pre-existing out of facility DNR order (yellow form or pink MOST form) Pink MOST form placed in chart (order not valid for inpatient use)     Chief Complaint  Patient presents with  . Medical Management of Chronic Issues    Routine [facility] visit    HPI:  Pt is a 74 y.o. female seen today for medical management of chronic diseases.  She is a long-term care resident of [facility].  She has a PMH of TIA/stroke, Parkinson's disease with hallucinations, dyslipidemia, GERD, fibromyalgia, dementia and history of asthma.  She has completed 2 more during the COVID-19 vaccines.  No adverse reactions were reported.  She is being followed by palliative care.   Past Medical History:  Diagnosis Date  . Allergy   . Anxiety   . Arthritis   . Asthma   . Benign essential hypertension 03/18/2017  . Cataract   . Dementia (HCC)   . Depression   . Difficulty walking   . Dysphagia, oral phase   . Fibromyalgia   . GERD  (gastroesophageal reflux disease)   . Hallucinations   . Hyperlipidemia   . Lower extremity edema   . Parkinson disease (HCC)   . Repeated falls   . Rhabdomyolysis   . Stroke (HCC)   . TIA (transient ischemic attack)   . Urinary incontinence    Past Surgical History:  Procedure Laterality Date  . ABDOMINAL HYSTERECTOMY    . CATARACT EXTRACTION    . CHOLECYSTECTOMY    . TONSILLECTOMY AND ADENOIDECTOMY      Allergies  Allergen Reactions  . Hydrocodone Other (See Comments)    GI upset    Outpatient Encounter Medications as of 03/14/2020  Medication Sig  . acetaminophen (TYLENOL) 325 MG tablet Take 650 mg by mouth every 6 (six) hours as needed for mild pain or moderate pain.  Marland Kitchen albuterol (PROVENTIL HFA;VENTOLIN HFA) 108 (90 Base) MCG/ACT inhaler Inhale 2 puffs into the lungs every 6 (six) hours as needed for wheezing or shortness of breath.  Marland Kitchen aspirin EC 81 MG tablet Take 81 mg by mouth daily.  . bisacodyl (BISACODYL LAXATIVE) 10 MG suppository Place 10 mg rectally as needed for moderate constipation.  . busPIRone (BUSPAR) 15 MG tablet Take 15 mg by mouth 3 (three) times daily.  . carbidopa-levodopa (SINEMET IR) 25-100 MG tablet Take 1 tablet by mouth 3 (three) times daily.  . cholecalciferol (VITAMIN D) 1000 units tablet Take 1,000 Units by mouth daily.   Marland Kitchen donepezil (ARICEPT) 10 MG tablet Take 10 mg by mouth at bedtime.   . DULoxetine (CYMBALTA) 30 MG capsule Give 3 tablets (90mg ) by mouth  daily  . fluticasone (FLONASE) 50 MCG/ACT nasal spray Place 2 sprays into both nostrils daily.   . Lidocaine (SALONPAS PAIN RELIEVING) 4 % PTCH APPLY 2 PATCHES TOPICALLY TO LOWER BACK ONCE DAILY FOR PAIN (REMOVE AT BEDTIME)  . lithium carbonate 300 MG capsule Take 300 mg by mouth at bedtime.  Marland Kitchen LORazepam (ATIVAN) 1 MG tablet Take 1 tablet (1 mg total) by mouth every 12 (twelve) hours.  . magnesium hydroxide (MILK OF MAGNESIA) 400 MG/5ML suspension If no BM in 3 days, give 30 cc Milk of  Magnesium p.o. x 1 dose in 24 hours as needed (Do not use standing constipation orders for residents with renal failure CFR less than 30. Contact MD for orders) (Physician Order)  . memantine (NAMENDA) 10 MG tablet Take 10 mg by mouth 2 (two) times daily.   . NON FORMULARY Regular /thin diet consistency  . Nutritional Supplement LIQD Take 120 mLs by mouth 3 (three) times daily. MedPass   . pantoprazole (PROTONIX) 40 MG tablet Take 40 mg by mouth daily.   . polyethylene glycol (MIRALAX / GLYCOLAX) packet Take 17 g by mouth daily as needed.   . sodium fluoride (PREVIDENT 5000 PLUS) 1.1 % CREA dental cream Place 1 application onto teeth. BRUSH ON TEETH WITH A TOOTHBRUSH EVERY EVENING AFTER PM MOUTH CARE. SPIT OUT EXCESS AND DO NOT RINSE  . Sodium Phosphates (RA SALINE ENEMA RE) If not relieved by Biscodyl suppository, give disposable Saline Enema rectally X 1 dose/24 hrs as needed (Do not use constipation standing orders for residents with renal failure/CFR less than 30. Contact MD for orders)(Physician Or  . ziprasidone (GEODON) 80 MG capsule Take 80 mg by mouth 2 (two) times daily.   . ondansetron (ZOFRAN) 4 MG tablet Take 4 mg by mouth every 6 (six) hours as needed for nausea or vomiting.  . [DISCONTINUED] calcium carbonate (TUMS - DOSED IN MG ELEMENTAL CALCIUM) 500 MG chewable tablet Chew 1 tablet by mouth every 6 (six) hours as needed for indigestion or heartburn.  . [DISCONTINUED] loperamide (IMODIUM) 2 MG capsule Take 2 mg by mouth every 6 (six) hours as needed for diarrhea or loose stools.  . [DISCONTINUED] Loratadine (CLARITIN REDITABS) 5 MG TBDP Take 10 mg by mouth daily as needed.    No facility-administered encounter medications on file as of 03/14/2020.    Review of Systems  GENERAL: No change in appetite, no fatigue, no weight changes, no fever, chills or weakness MOUTH and THROAT: Denies oral discomfort, gingival pain or bleeding RESPIRATORY: no cough, SOB, DOE, wheezing,  hemoptysis CARDIAC: No chest pain, edema or palpitations GI: No abdominal pain, diarrhea, constipation, heart burn, nausea or vomiting NEUROLOGICAL: Denies dizziness, syncope, numbness, or headache PSYCHIATRIC: Denies feelings of depression or anxiety. No report of hallucinations, insomnia, paranoia, or agitation   Immunization History  Administered Date(s) Administered  . Influenza-Unspecified 03/17/2017, 09/09/2017, 08/17/2018, 08/29/2019  . PPD Test 03/24/2017, 06/02/2018, 06/08/2018  . Pneumococcal Polysaccharide-23 03/17/2017, 06/12/2018   Pertinent  Health Maintenance Due  Topic Date Due  . DEXA SCAN  Never done  . PNA vac Low Risk Adult (2 of 2 - PCV13) 06/13/2019  . INFLUENZA VACCINE  06/17/2020   Fall Risk  05/27/2018 05/14/2017  Falls in the past year? No Yes  Number falls in past yr: - 2 or more  Injury with Fall? - No     Vitals:   03/14/20 1327  BP: (!) 122/58  Pulse: 72  Resp: 16  Temp: (!) 97.5  F (36.4 C)  TempSrc: Oral  Weight: 136 lb 6.4 oz (61.9 kg)  Height: 5\' 5"  (1.651 m)   Body mass index is 22.7 kg/m.  Physical Exam  GENERAL APPEARANCE: Well nourished. In no acute distress. Normal body habitus SKIN:  Skin is warm and dry.  MOUTH and THROAT: Lips are without lesions. Oral mucosa is moist and without lesions. Tongue is normal in shape, size, and color and without lesions RESPIRATORY: Breathing is even & unlabored, BS CTAB CARDIAC: RRR, no murmur,no extra heart sounds, no edema GI: Abdomen soft, normal BS, no masses, no tenderness NEUROLOGICAL: Has tremor. Speech is clear PSYCHIATRIC: Affect and behavior are appropriate  Labs reviewed: Recent Labs    04/30/19 0000 12/22/19 0000 12/26/19 0000  NA 144 142 144  K 3.7 3.5 3.9  CL  --  106 106  CO2  --  29* 27*  BUN 16 15 15   CREATININE 0.7 0.6 0.5  CALCIUM  --  9.3 8.7   Recent Labs    04/30/19 0000 12/22/19 0000 12/26/19 0000  AST 12* 13 11*  ALT 6* 7 5*  ALKPHOS 88 65 60   ALBUMIN  --  3.8 3.5   Recent Labs    04/30/19 0000 12/22/19 0000 12/26/19 0000  WBC 6.2 5.1 5.7  NEUTROABS 4  --   --   HGB 13.4 12.6 11.8*  HCT 39 37 33*  PLT 261 256 267   Lab Results  Component Value Date   TSH 4.70 04/30/2019   Lab Results  Component Value Date   HGBA1C 5.4 03/15/2018   Lab Results  Component Value Date   CHOL 139 03/15/2018   HDL 46 03/15/2018   LDLCALC 74 03/15/2018   TRIG 97 03/15/2018    Assessment/Plan  1. Allergic rhinitis, unspecified seasonality, unspecified trigger -Stable, continue fluticasone nasal spray  2. GERD without esophagitis -Continue pantoprazole  3. Parkinson disease ([place]) -Has tremors on bilateral hands and legs, continue carbidopa-levodopa -Followed by palliative care  4. Schizoaffective disorder, depressive type ([place]) -Mood is stable, continue lithium carbonate, duloxetine, ziprasidone and buspirone    Family/ staff Communication: Discussed plan of care with resident and charge nurse.  Labs/tests ordered: None  Goals of care: Long-term care    Durenda Age, DNP, FNP-BC [facility] [phone] (Monday-Friday 8:00 a.m. - 5:00 p.m.) [phone] (after hours)

## 2020-03-26 ENCOUNTER — Non-Acute Institutional Stay: Payer: Medicare Other

## 2020-03-26 ENCOUNTER — Other Ambulatory Visit: Payer: Self-pay

## 2020-03-26 DIAGNOSIS — Z515 Encounter for palliative care: Secondary | ICD-10-CM

## 2020-03-27 ENCOUNTER — Telehealth: Payer: Self-pay

## 2020-03-27 ENCOUNTER — Ambulatory Visit: Payer: Medicare Other | Admitting: Neurology

## 2020-03-27 NOTE — Progress Notes (Signed)
COMMUNITY PALLIATIVE CARE SW NOTE  PATIENT NAME: Jennifer Garner DOB: July 17, 1946 MRN: [id number]  PRIMARY CARE PROVIDER: Pecola Lawless, MD  RESPONSIBLE PARTY:  Acct ID - Guarantor Home Phone Work Phone Relationship Acct Type  [id number] Christel Mormon,* [phone]  Self P/F     [address] [place], [place] [address]     PLAN OF CARE and INTERVENTIONS:             1. GOALS OF CARE/ ADVANCE CARE PLANNING: Goal is for patient to remain at the facility and receive adequate care. Patient is a DNR and has a MOST form  2. SOCIAL/EMOTIONAL/SPIRITUAL ASSESSMENT/ INTERVENTIONS:  SW completed a face-to-face visit with patient at the facility [facility]). Patient was lying in bed facing the wall. She turned toward SW when her name was called. She denied pain, but reported that she was trying to take a nap. She report that she continues to eat well, despite the staff reporting poor intake. She advises that her daughter continues to visit regularly and she looks forward to her visits. Patient's affect remains flat, verbalizations are delayed, but she is verbally responsive. She expressed no coping issues. Patient is dependent for all ADL's. SW consulted with the LPN on duty, who reported no concerns regarding patient or changes in her condition. SW attempted to consult with facility SW-but was asked to send her an e-mail as she was not available. SW provided supportive presence, social stimulation, observation, assessed needs and comfort of patient, consulted with the staff.  3. PATIENT/CAREGIVER EDUCATION/ COPING:  No coping issues expressed by patient. SW left a message for patient's daughter-[name] updating her on the visit and extending support to her.  4. PERSONAL EMERGENCY PLAN: Facility has standing orders and protocols in place. 911 can be activated for any emergency. 5. COMMUNITY RESOURCES COORDINATION/ HEALTH CARE NAVIGATION:  Palliative Care support reinforced to patient and facility  staff. Patient has access to the activities program for social support.  6. FINANCIAL/LEGAL CONCERNS/INTERVENTIONS:  No financial/legal issues.      SOCIAL HX:  Social History   Tobacco Use  . Smoking status: Never Smoker  . Smokeless tobacco: Never Used  Substance Use Topics  . Alcohol use: No    CODE STATUS:   Code Status: Not on file  ADVANCED DIRECTIVES: N MOST FORM COMPLETE:  Yes HOSPICE EDUCATION PROVIDED: No  PPS: Patient bedbound, occasionally getting up in her to her broada chair. She dependent for personal care needs, but is able to fed herself.   SW spent 60 minutes with patient and facility staff assessing patient's needs and comfort, observation, supportive presence, active listening, reassurance of support and consult with staff      Clydia Llano, LCSW

## 2020-03-27 NOTE — Telephone Encounter (Signed)
Telephone call to patient's daughter-[name] to update her on the visit with her mother today and extended support to her. SW requested a call back to touch basis with her.

## 2020-03-30 ENCOUNTER — Encounter: Payer: Self-pay | Admitting: Adult Health

## 2020-03-30 ENCOUNTER — Non-Acute Institutional Stay (SKILLED_NURSING_FACILITY): Payer: Medicare Other | Admitting: Adult Health

## 2020-03-30 DIAGNOSIS — G20 Parkinson's disease: Secondary | ICD-10-CM

## 2020-03-30 DIAGNOSIS — G903 Multi-system degeneration of the autonomic nervous system: Secondary | ICD-10-CM | POA: Diagnosis not present

## 2020-03-30 DIAGNOSIS — F333 Major depressive disorder, recurrent, severe with psychotic symptoms: Secondary | ICD-10-CM | POA: Diagnosis not present

## 2020-03-30 DIAGNOSIS — F251 Schizoaffective disorder, depressive type: Secondary | ICD-10-CM

## 2020-03-30 MED ORDER — MIDODRINE HCL 2.5 MG PO TABS: 2.5000 mg | ORAL_TABLET | Freq: Two times a day (BID) | ORAL | 0 refills | Status: DC | PRN

## 2020-03-30 NOTE — Progress Notes (Signed)
Location:  [facility] Room Number: 225-B Place of Service:  SNF (31) Provider:  Kenard Gower, DNP, FNP-BC  Patient Care Team: Pecola Lawless, MD as PCP - General (Internal Medicine) Center, [facility] (Skilled Nursing Facility) Synetta Shadow as Physician Assistant (Internal Medicine)  Extended Emergency Contact Information Primary Emergency Contact: [name] of [place] Home Phone: [phone] Relation: Daughter Secondary Emergency Contact: [name] States of [place] Home Phone: [phone] Relation: Daughter  Code Status:  Full Code  Goals of care: Advanced Directive information Advanced Directives 02/07/2020  Does Patient Have a Medical Advance Directive? Yes  Type of Advance Directive (No Data)  Does patient want to make changes to medical advance directive? No - Patient declined  Would patient like information on creating a medical advance directive? -  Pre-existing out of facility DNR order (yellow form or pink MOST form) Pink MOST form placed in chart (order not valid for inpatient use)     Chief Complaint  Patient presents with  . Acute Visit    Patient is seen for hypotension.     HPI:  Pt is a 74 y.o. female seen for hypotension. BPs reported to be 99/65, 90/56, 99/50 and 99/52. She was seen in the room today. She denies having headache nor vertigo. She has a PMH of Parkinson's disease with hallucinations, TIA/stroke, dyslipidemia, GERD, fibromyalgia, dementia and history of asthma.   Past Medical History:  Diagnosis Date  . Allergy   . Anxiety   . Arthritis   . Asthma   . Benign essential hypertension 03/18/2017  . Cataract   . Dementia (HCC)   . Depression   . Difficulty walking   . Dysphagia, oral phase   . Fibromyalgia   . GERD (gastroesophageal reflux disease)   . Hallucinations   . Hyperlipidemia   . Lower extremity edema   . Parkinson disease (HCC)   . Repeated  falls   . Rhabdomyolysis   . Stroke (HCC)   . TIA (transient ischemic attack)   . Urinary incontinence    Past Surgical History:  Procedure Laterality Date  . ABDOMINAL HYSTERECTOMY    . CATARACT EXTRACTION    . CHOLECYSTECTOMY    . TONSILLECTOMY AND ADENOIDECTOMY      Allergies  Allergen Reactions  . Hydrocodone Other (See Comments)    GI upset    Outpatient Encounter Medications as of 03/30/2020  Medication Sig  . acetaminophen (TYLENOL) 325 MG tablet Take 650 mg by mouth every 6 (six) hours as needed for mild pain or moderate pain.  Marland Kitchen albuterol (PROVENTIL HFA;VENTOLIN HFA) 108 (90 Base) MCG/ACT inhaler Inhale 2 puffs into the lungs every 6 (six) hours as needed for wheezing or shortness of breath.  Marland Kitchen aspirin EC 81 MG tablet Take 81 mg by mouth daily.  . bisacodyl (BISACODYL LAXATIVE) 10 MG suppository Place 10 mg rectally as needed for moderate constipation.  . busPIRone (BUSPAR) 15 MG tablet Take 15 mg by mouth 3 (three) times daily.  . carbidopa-levodopa (SINEMET IR) 25-100 MG tablet Take 1 tablet by mouth 3 (three) times daily.  . cholecalciferol (VITAMIN D) 1000 units tablet Take 1,000 Units by mouth daily.   Marland Kitchen donepezil (ARICEPT) 10 MG tablet Take 10 mg by mouth at bedtime.   . DULoxetine (CYMBALTA) 30 MG capsule Give 3 tablets (90mg ) by mouth daily  . fluticasone (FLONASE) 50 MCG/ACT nasal spray Place 2 sprays into both nostrils daily.   . Lidocaine (SALONPAS  PAIN RELIEVING) 4 % PTCH APPLY 2 PATCHES TOPICALLY TO LOWER BACK ONCE DAILY FOR PAIN (REMOVE AT BEDTIME)  . lithium carbonate 300 MG capsule Take 300 mg by mouth at bedtime.  Marland Kitchen LORazepam (ATIVAN) 1 MG tablet Take 1 tablet (1 mg total) by mouth every 12 (twelve) hours.  . magnesium hydroxide (MILK OF MAGNESIA) 400 MG/5ML suspension If no BM in 3 days, give 30 cc Milk of Magnesium p.o. x 1 dose in 24 hours as needed (Do not use standing constipation orders for residents with renal failure CFR less than 30. Contact MD for  orders) (Physician Order)  . memantine (NAMENDA) 10 MG tablet Take 10 mg by mouth 2 (two) times daily.   . NON FORMULARY Regular /thin diet consistency  . Nutritional Supplement LIQD Take 120 mLs by mouth 3 (three) times daily. MedPass   . pantoprazole (PROTONIX) 40 MG tablet Take 40 mg by mouth daily.   . polyethylene glycol (MIRALAX / GLYCOLAX) packet Take 17 g by mouth daily as needed.   . sodium fluoride (PREVIDENT 5000 PLUS) 1.1 % CREA dental cream Place 1 application onto teeth. BRUSH ON TEETH WITH A TOOTHBRUSH EVERY EVENING AFTER PM MOUTH CARE. SPIT OUT EXCESS AND DO NOT RINSE  . Sodium Phosphates (RA SALINE ENEMA RE) If not relieved by Biscodyl suppository, give disposable Saline Enema rectally X 1 dose/24 hrs as needed (Do not use constipation standing orders for residents with renal failure/CFR less than 30. Contact MD for orders)(Physician Or  . ziprasidone (GEODON) 80 MG capsule Take 80 mg by mouth 2 (two) times daily.   . [DISCONTINUED] ondansetron (ZOFRAN) 4 MG tablet Take 4 mg by mouth every 6 (six) hours as needed for nausea or vomiting.   No facility-administered encounter medications on file as of 03/30/2020.    Review of Systems  GENERAL: No fever or chills  MOUTH and THROAT: Denies oral discomfort, gingival pain or bleeding RESPIRATORY: no cough, SOB, DOE, wheezing, hemoptysis CARDIAC: No chest pain, edema or palpitations GI: No abdominal pain, diarrhea, constipation, heart burn, nausea or vomiting NEUROLOGICAL: Denies dizziness, syncope, numbness, or headache PSYCHIATRIC: Denies feelings of depression or anxiety. No report of hallucinations, insomnia, paranoia, or agitation   Immunization History  Administered Date(s) Administered  . Influenza-Unspecified 03/17/2017, 09/09/2017, 08/17/2018, 08/29/2019  . PPD Test 03/24/2017, 06/02/2018, 06/08/2018  . Pneumococcal Polysaccharide-23 03/17/2017, 06/12/2018   Pertinent  Health Maintenance Due  Topic Date Due  . DEXA  SCAN  Never done  . PNA vac Low Risk Adult (2 of 2 - PCV13) 06/13/2019  . INFLUENZA VACCINE  06/17/2020   Fall Risk  05/27/2018 05/14/2017  Falls in the past year? No Yes  Number falls in past yr: - 2 or more  Injury with Fall? - No     Vitals:   03/30/20 1440  BP: 99/65  Pulse: 67  Resp: 17  Temp: (!) 97.1 F (36.2 C)  TempSrc: Oral  Weight: 136 lb 6.4 oz (61.9 kg)  Height: 5\' 5"  (1.651 m)   Body mass index is 22.7 kg/m.  Physical Exam double check check no 1 2 evaluated  GENERAL APPEARANCE: Well nourished. In no acute distress. Normal body habitus SKIN:  Skin is warm and dry.  MOUTH and THROAT: Lips are without lesions. Oral mucosa is moist and without lesions. Tongue is normal in shape, size, and color and without lesions RESPIRATORY: Breathing is even & unlabored, BS CTAB CARDIAC: RRR, no murmur,no extra heart sounds, no edema GI: Abdomen soft,  normal BS, no masses, no tenderness NEUROLOGICAL: + tremor. Speech is clear PSYCHIATRIC: Affect and behavior are appropriate  Labs reviewed: Recent Labs    04/30/19 0000 12/22/19 0000 12/26/19 0000  NA 144 142 144  K 3.7 3.5 3.9  CL  --  106 106  CO2  --  29* 27*  BUN 16 15 15   CREATININE 0.7 0.6 0.5  CALCIUM  --  9.3 8.7   Recent Labs    04/30/19 0000 12/22/19 0000 12/26/19 0000  AST 12* 13 11*  ALT 6* 7 5*  ALKPHOS 88 65 60  ALBUMIN  --  3.8 3.5   Recent Labs    04/30/19 0000 12/22/19 0000 12/26/19 0000  WBC 6.2 5.1 5.7  NEUTROABS 4  --   --   HGB 13.4 12.6 11.8*  HCT 39 37 33*  PLT 261 256 267   Lab Results  Component Value Date   TSH 4.70 04/30/2019   Lab Results  Component Value Date   HGBA1C 5.4 03/15/2018   Lab Results  Component Value Date   CHOL 139 03/15/2018   HDL 46 03/15/2018   LDLCALC 74 03/15/2018   TRIG 97 03/15/2018    Assessment/Plan  1. Neurogenic orthostatic hypotension (HCC) - BPs low, will start on PRN Midodrine - midodrine (PROAMATINE) 2.5 MG tablet; Take 1  tablet (2.5 mg total) by mouth 2 (two) times daily as needed. For SBP <=95  Dispense: 30 tablet; Refill: 0 - monitor BPs  2. Parkinson disease ([place]) - continue Carbidopa-Levodopa - daughter wants resident to be referred to neurology  3. Schizoaffective disorder - denies having hallucinations, continue Ziprasidone, Ativan, Lithium Carbonate - follows up with Psych NP  4. 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  Labs/tests ordered: CBC and BMP  Goals of care:  Long-term care  Durenda Age, DNP, FNP-BC [facility] and Adult [facility] [phone] (Monday-Friday 8:00 a.m. - 5:00 p.m.) [phone] (after hours)

## 2020-04-03 DIAGNOSIS — M797 Fibromyalgia: Secondary | ICD-10-CM | POA: Diagnosis not present

## 2020-04-03 DIAGNOSIS — D649 Anemia, unspecified: Secondary | ICD-10-CM | POA: Diagnosis not present

## 2020-04-03 DIAGNOSIS — F419 Anxiety disorder, unspecified: Secondary | ICD-10-CM | POA: Diagnosis not present

## 2020-04-03 DIAGNOSIS — K219 Gastro-esophageal reflux disease without esophagitis: Secondary | ICD-10-CM | POA: Diagnosis not present

## 2020-04-03 DIAGNOSIS — R32 Unspecified urinary incontinence: Secondary | ICD-10-CM | POA: Diagnosis not present

## 2020-04-03 LAB — CBC AND DIFFERENTIAL
HCT: 34 — AB (ref 36–46)
Hemoglobin: 12.2 (ref 12.0–16.0)
Neutrophils Absolute: 3
Platelets: 262 (ref 150–399)
WBC: 5.5

## 2020-04-03 LAB — BASIC METABOLIC PANEL
BUN: 15 (ref 4–21)
CO2: 26 — AB (ref 13–22)
Chloride: 105 (ref 99–108)
Creatinine: 0.5 (ref 0.5–1.1)
Glucose: 86
Potassium: 3.6 (ref 3.4–5.3)
Sodium: 143 (ref 137–147)

## 2020-04-03 LAB — COMPREHENSIVE METABOLIC PANEL
Calcium: 9.6 (ref 8.7–10.7)
GFR calc Af Amer: 90
GFR calc non Af Amer: 90

## 2020-04-03 LAB — CBC: RBC: 3.77 — AB (ref 3.87–5.11)

## 2020-04-06 ENCOUNTER — Other Ambulatory Visit: Payer: Self-pay | Admitting: Adult Health

## 2020-04-06 ENCOUNTER — Encounter: Payer: Self-pay | Admitting: Adult Health

## 2020-04-06 ENCOUNTER — Non-Acute Institutional Stay (SKILLED_NURSING_FACILITY): Payer: Medicare Other | Admitting: Adult Health

## 2020-04-06 DIAGNOSIS — Z13820 Encounter for screening for osteoporosis: Secondary | ICD-10-CM

## 2020-04-06 DIAGNOSIS — G903 Multi-system degeneration of the autonomic nervous system: Secondary | ICD-10-CM

## 2020-04-06 DIAGNOSIS — Z23 Encounter for immunization: Secondary | ICD-10-CM

## 2020-04-06 DIAGNOSIS — K219 Gastro-esophageal reflux disease without esophagitis: Secondary | ICD-10-CM

## 2020-04-06 DIAGNOSIS — F251 Schizoaffective disorder, depressive type: Secondary | ICD-10-CM

## 2020-04-06 DIAGNOSIS — G20 Parkinson's disease: Secondary | ICD-10-CM | POA: Diagnosis not present

## 2020-04-06 MED ORDER — LORAZEPAM 1 MG PO TABS: 1.0000 mg | ORAL_TABLET | Freq: Two times a day (BID) | ORAL | 0 refills | Status: DC

## 2020-04-06 NOTE — Progress Notes (Signed)
Location:  [facility] Room Number: 225-B Place of Service:  SNF (31) Provider:  Kenard Gower, DNP, FNP-BC  Patient Care Team: Pecola Lawless, MD as PCP - General (Internal Medicine) Center, [facility] (Skilled Nursing Facility) Synetta Shadow as Physician Assistant (Internal Medicine)  Extended Emergency Contact Information Primary Emergency Contact: [name] of [place] Home Phone: [phone] Relation: Daughter Secondary Emergency Contact: [name] States of [place] Home Phone: [phone] Relation: Daughter  Code Status:  FULL CODE  Goals of care: Advanced Directive information Advanced Directives 02/07/2020  Does Patient Have a Medical Advance Directive? Yes  Type of Advance Directive (No Data)  Does patient want to make changes to medical advance directive? No - Patient declined  Would patient like information on creating a medical advance directive? -  Pre-existing out of facility DNR order (yellow form or pink MOST form) Pink MOST form placed in chart (order not valid for inpatient use)     Chief Complaint  Patient presents with  . Medical Management of Chronic Issues    Routine [facility] visit  . Immunizations    Prevnar  . Best Practice Recommendations    DEXA    HPI:  Pt is a 74 y.o. female seen today for medical management of chronic diseases.  She is a long-term care resident of [facility].  She has a PMH of TIA, stroke, Parkinson's disease with hallucinations, dyslipidemia and history of asthma. Daughter has requested for resident to consult another neurologist regarding her Parkinson's disease. She was seen in her room today. Noted to have blunt affect. She was recently started on PRN Midodrine due to hypotension. SBPs ranging from 98 to 117.   Past Medical History:  Diagnosis Date  . Allergy   . Anxiety   . Arthritis   . Asthma   . Benign  essential hypertension 03/18/2017  . Cataract   . Dementia (HCC)   . Depression   . Difficulty walking   . Dysphagia, oral phase   . Fibromyalgia   . GERD (gastroesophageal reflux disease)   . Hallucinations   . Hyperlipidemia   . Lower extremity edema   . Parkinson disease (HCC)   . Repeated falls   . Rhabdomyolysis   . Stroke (HCC)   . TIA (transient ischemic attack)   . Urinary incontinence    Past Surgical History:  Procedure Laterality Date  . ABDOMINAL HYSTERECTOMY    . CATARACT EXTRACTION    . CHOLECYSTECTOMY    . TONSILLECTOMY AND ADENOIDECTOMY      Allergies  Allergen Reactions  . Hydrocodone Other (See Comments)    GI upset    Outpatient Encounter Medications as of 04/06/2020  Medication Sig  . acetaminophen (TYLENOL) 325 MG tablet Take 650 mg by mouth every 6 (six) hours as needed for mild pain or moderate pain.  Marland Kitchen albuterol (PROVENTIL HFA;VENTOLIN HFA) 108 (90 Base) MCG/ACT inhaler Inhale 2 puffs into the lungs every 6 (six) hours as needed for wheezing or shortness of breath.  Marland Kitchen aspirin EC 81 MG tablet Take 81 mg by mouth daily.  . bisacodyl (BISACODYL LAXATIVE) 10 MG suppository Place 10 mg rectally as needed for moderate constipation.  . busPIRone (BUSPAR) 15 MG tablet Take 15 mg by mouth 3 (three) times daily.  . carbidopa-levodopa (SINEMET IR) 25-100 MG tablet Take 1 tablet by mouth 3 (three) times daily.  . cholecalciferol (VITAMIN D) 1000 units tablet Take 1,000 Units by  mouth daily.   Marland Kitchen donepezil (ARICEPT) 10 MG tablet Take 10 mg by mouth at bedtime.   . DULoxetine (CYMBALTA) 30 MG capsule Give 3 tablets (90mg ) by mouth daily  . fluticasone (FLONASE) 50 MCG/ACT nasal spray Place 2 sprays into both nostrils daily.   . Lidocaine (SALONPAS PAIN RELIEVING) 4 % PTCH APPLY 2 PATCHES TOPICALLY TO LOWER BACK ONCE DAILY FOR PAIN (REMOVE AT BEDTIME)  . lithium carbonate 300 MG capsule Take 300 mg by mouth at bedtime.  LORazepam (ATIVAN) 1 MG tablet Take 1 tablet  (1 mg total) by mouth every 12 (twelve) hours.  . magnesium hydroxide (MILK OF MAGNESIA) 400 MG/5ML suspension If no BM in 3 days, give 30 cc Milk of Magnesium p.o. x 1 dose in 24 hours as needed (Do not use standing constipation orders for residents with renal failure CFR less than 30. Contact MD for orders) (Physician Order)  . memantine (NAMENDA) 10 MG tablet Take 10 mg by mouth 2 (two) times daily.   . midodrine (PROAMATINE) 2.5 MG tablet Take 1 tablet (2.5 mg total) by mouth 2 (two) times daily as needed. For SBP <=95  . NON FORMULARY Regular /thin diet consistency  . Nutritional Supplement LIQD Take 120 mLs by mouth 3 (three) times daily. MedPass   . pantoprazole (PROTONIX) 40 MG tablet Take 40 mg by mouth daily.   . polyethylene glycol (MIRALAX / GLYCOLAX) packet Take 17 g by mouth daily as needed.   . sodium fluoride (PREVIDENT 5000 PLUS) 1.1 % CREA dental cream Place 1 application onto teeth at bedtime.   . Sodium Phosphates (RA SALINE ENEMA RE) If not relieved by Biscodyl suppository, give disposable Saline Enema rectally X 1 dose/24 hrs as needed (Do not use constipation standing orders for residents with renal failure/CFR less than 30. Contact MD for orders)(Physician Or  . ziprasidone (GEODON) 80 MG capsule Take 80 mg by mouth 2 (two) times daily.    No facility-administered encounter medications on file as of 04/06/2020.    Review of Systems  GENERAL: No change in appetite, no fatigue, no weight changes, no fever, chills or weakness MOUTH and THROAT: Denies oral discomfort, gingival pain or bleeding RESPIRATORY: no cough, SOB, DOE, wheezing, hemoptysis CARDIAC: No chest pain, edema or palpitations GI: No abdominal pain, diarrhea, constipation, heart burn, nausea or vomiting GU: Denies dysuria, frequency, hematuria or discharge NEUROLOGICAL: Denies dizziness, syncope, numbness, or headache PSYCHIATRIC: Denies feelings of depression or anxiety. No report of hallucinations,  insomnia, paranoia, or agitation   Immunization History  Administered Date(s) Administered  . Influenza-Unspecified 03/17/2017, 09/09/2017, 08/17/2018, 08/29/2019  . Moderna SARS-COVID-2 Vaccination 12/05/2019, 01/02/2020  . PPD Test 03/24/2017, 06/02/2018, 06/08/2018  . Pneumococcal Polysaccharide-23 03/17/2017, 06/12/2018   Pertinent  Health Maintenance Due  Topic Date Due  . DEXA SCAN  Never done  . PNA vac Low Risk Adult (2 of 2 - PCV13) 06/13/2019  . INFLUENZA VACCINE  06/17/2020   Fall Risk  05/27/2018 05/14/2017  Falls in the past year? No Yes  Number falls in past yr: - 2 or more  Injury with Fall? - No     Vitals:   04/06/20 1114  BP: 117/65  Pulse: 72  Resp: 18  Temp: (!) 97.4 F (36.3 C)  TempSrc: Oral  Weight: 136 lb 6.4 oz (61.9 kg)  Height: 5\' 5"  (1.651 m)   Body mass index is 22.7 kg/m.  Physical Exam  GENERAL APPEARANCE: Well nourished. In no acute distress. Normal body habitus  SKIN:  Skin is warm and dry.  MOUTH and THROAT: Lips are without lesions. Oral mucosa is moist and without lesions.  RESPIRATORY: Breathing is even & unlabored, BS CTAB CARDIAC: RRR, no murmur,no extra heart sounds, no edema GI: Abdomen soft, normal BS, no masses, no tenderness NEUROLOGICAL: + tremor. Speech is clear. PSYCHIATRIC:  Blunt affect and behavior appropriate  Labs reviewed: Recent Labs    04/30/19 0000 12/22/19 0000 12/26/19 0000  NA 144 142 144  K 3.7 3.5 3.9  CL  --  106 106  CO2  --  29* 27*  BUN 16 15 15   CREATININE 0.7 0.6 0.5  CALCIUM  --  9.3 8.7   Recent Labs    04/30/19 0000 12/22/19 0000 12/26/19 0000  AST 12* 13 11*  ALT 6* 7 5*  ALKPHOS 88 65 60  ALBUMIN  --  3.8 3.5   Recent Labs    04/30/19 0000 12/22/19 0000 12/26/19 0000  WBC 6.2 5.1 5.7  NEUTROABS 4  --   --   HGB 13.4 12.6 11.8*  HCT 39 37 33*  PLT 261 256 267   Lab Results  Component Value Date   TSH 4.70 04/30/2019   Lab Results  Component Value Date   HGBA1C  5.4 03/15/2018   Lab Results  Component Value Date   CHOL 139 03/15/2018   HDL 46 03/15/2018   LDLCALC 74 03/15/2018   TRIG 97 03/15/2018    Assessment/Plan  1. Parkinson disease ([place]) -Continue carbidopa-levodopa - referred for another neurology consult per daughter's request  2. Schizoaffective disorder, depressive type ([place]) -Stable, continue duloxetine, lithium carbonate, Ativan, buspirone and ziprasidone -Follow-up by psych NP  3. Neurogenic orthostatic hypotension (HCC) -Recently started PRN Midodrine  4. GERD without esophagitis - Continue pantoprazole  5. Need for vaccination against Streptococcus pneumoniae using pneumococcal conjugate vaccine 13 -Prevnar 13 9 months 1  6. Screening for osteoporosis -DEXA scan    Family/ staff Communication: Discussed plan of care with resident and charge nurse.  Labs/tests ordered: DEXA scan  Goals of care:   Long-term care  Durenda Age, DNP, FNP-BC [facility] and Adult Medicine [phone] (Monday-Friday 8:00 a.m. - 5:00 p.m.) [phone] (after hours)

## 2020-04-07 DIAGNOSIS — M810 Age-related osteoporosis without current pathological fracture: Secondary | ICD-10-CM | POA: Diagnosis not present

## 2020-04-12 DIAGNOSIS — R293 Abnormal posture: Secondary | ICD-10-CM | POA: Diagnosis not present

## 2020-04-13 DIAGNOSIS — R293 Abnormal posture: Secondary | ICD-10-CM | POA: Diagnosis not present

## 2020-04-16 DIAGNOSIS — R293 Abnormal posture: Secondary | ICD-10-CM | POA: Diagnosis not present

## 2020-05-02 ENCOUNTER — Encounter: Payer: Self-pay | Admitting: Neurology

## 2020-05-03 ENCOUNTER — Non-Acute Institutional Stay (SKILLED_NURSING_FACILITY): Payer: Medicare Other | Admitting: Internal Medicine

## 2020-05-03 ENCOUNTER — Encounter: Payer: Self-pay | Admitting: Internal Medicine

## 2020-05-03 DIAGNOSIS — D649 Anemia, unspecified: Secondary | ICD-10-CM | POA: Diagnosis not present

## 2020-05-03 DIAGNOSIS — I10 Essential (primary) hypertension: Secondary | ICD-10-CM | POA: Diagnosis not present

## 2020-05-03 DIAGNOSIS — F0150 Vascular dementia without behavioral disturbance: Secondary | ICD-10-CM | POA: Diagnosis not present

## 2020-05-03 NOTE — Assessment & Plan Note (Signed)
BP controlled; no change in antihypertensive medications  

## 2020-05-03 NOTE — Assessment & Plan Note (Signed)
In May the anemia had improved with values of hemoglobin 12.2/hematocrit 34.  No bleeding dyscrasias reported.

## 2020-05-03 NOTE — Progress Notes (Signed)
   NURSING HOME LOCATION:  [facility] ROOM NUMBER:  [id number]  CODE STATUS:  FULL CODE  PCP:  Pecola Lawless, MD  [address] [place] [address]   This is a nursing facility follow up of chronic medical diagnoses.  Interim medical record and care since last [facility] visit was updated with review of diagnostic studies and change in clinical status since last visit were documented.  HPI: She is a permanent resident of the facility with medical diagnoses of history of TIA and stroke, history of rhabdomyolysis, Parkinson's disease with hallucinations, dyslipidemia, GERD, fibromyalgia, hypertension, and dementia.  Review of systems: Dementia prevented any meaningful history.  Her only positive reply was "off-again, on again" and that was she was having "back pain" w/o clarification.  Other than that she was essentially nonverbal following commands very poorly.  Physical exam:  Pertinent or positive findings: It was after 12 noon and she was still in bed.  Staff had just  performed hygiene activities.  The staff did state that there are no behavioral issues other than her generalized pattern of withdrawal.  Facies are masked.  She had intermittent jerking of the extremities.  She lay supine with her arms crossed over the chest.  Heart sounds were distant; the rate appears slow.  Breath sounds are decreased.  All limbs exhibited atrophy & were weak to opposition.  Dorsalis pedis pulses were stronger than posterior tibial pulses.  Toenails were thickened.  As noted she followed commands very poorly and slowly.  General appearance: no acute distress, increased work of breathing is present.   Lymphatic: No lymphadenopathy about the head, neck, axilla. Eyes: No conjunctival inflammation or lid edema is present. There is no scleral icterus. Ears:  External ear exam shows no significant lesions or deformities.   Nose:  External nasal examination shows no deformity or  inflammation. Nasal mucosa are pink and moist without lesions, exudates Oral exam:  Lips and gums are healthy appearing. There is no oropharyngeal erythema or exudate. Neck:  No thyromegaly, masses, tenderness noted.    Heart:  No gallop, murmur, click, rub .  Lungs: without wheezes, rhonchi, rales, rubs. Abdomen: Bowel sounds are normal. Abdomen is soft and nontender with no organomegaly, hernias, masses. GU: Deferred  Extremities:  No cyanosis, clubbing, edema  Neurologic exam :Balance, Rhomberg, finger to nose testing could not be completed due to clinical state Skin: Warm & dry w/o tenting. No significant lesions or rash.  See summary under each active problem in the Problem List with associated updated therapeutic plan

## 2020-05-03 NOTE — Patient Instructions (Signed)
See assessment and plan under each diagnosis in the problem list and acutely for this visit 

## 2020-05-03 NOTE — Assessment & Plan Note (Addendum)
No significant behavioral issues reported.  She basically social distanes, staying in bed with no interaction with other residents. Quality of life is non existant. Code status should be reviewed @ care plan meeting.

## 2020-05-07 ENCOUNTER — Other Ambulatory Visit: Payer: Self-pay

## 2020-05-07 ENCOUNTER — Other Ambulatory Visit: Payer: Self-pay | Admitting: Adult Health

## 2020-05-07 ENCOUNTER — Non-Acute Institutional Stay: Payer: Medicare Other

## 2020-05-07 DIAGNOSIS — Z515 Encounter for palliative care: Secondary | ICD-10-CM

## 2020-05-07 MED ORDER — LORAZEPAM 1 MG PO TABS: 1.0000 mg | ORAL_TABLET | Freq: Two times a day (BID) | ORAL | 0 refills | Status: DC
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
## 2020-05-17 ENCOUNTER — Non-Acute Institutional Stay: Payer: Medicare Other

## 2020-05-17 ENCOUNTER — Other Ambulatory Visit: Payer: Self-pay

## 2020-05-17 DIAGNOSIS — Z515 Encounter for palliative care: Secondary | ICD-10-CM

## 2020-05-18 NOTE — Progress Notes (Signed)
COMMUNITY PALLIATIVE CARE SW NOTE  PATIENT NAME: Jennifer Garner DOB: 1946/09/05 MRN: [id number]  PRIMARY CARE PROVIDER: Pecola Lawless, MD  RESPONSIBLE PARTY:  Acct ID - Guarantor Home Phone Work Phone Relationship Acct Type  [id number] Christel Mormon,* [id number]  Self P/F     [address], [place], [place] [address]     PLAN OF CARE and INTERVENTIONS:             1. GOALS OF CARE/ ADVANCE CARE PLANNING:Goal is for patient to remain at the facility and receive adequate care. Patient is a DNR.   2. SOCIAL/EMOTIONAL/SPIRITUAL ASSESSMENT/ INTERVENTIONS:  SW completed a face-to-face visit with patient at the facility [facility]). Patient was lying in bed facing the wall. She turned toward SW when her name was called. She denied pain. She report that she continues to eat well. Her daughter continues to visit regularly and she looks forward to her visits.  Patient's affect remains flat, verbalizations are delayed, but she is verbally responsive and engaged. She expressed no coping issues. Patient is dependent for all ADL's. SW consulted with the LPN-Joselyn on duty, who reported no concerns regarding patient or changes in her condition. SW also consulted with the facility SW-Kendra and Business Manager-Tanya regarding visit with patient and assessed any needs-none noted. 3.  PATIENT/CAREGIVER EDUCATION/ COPING:  No coping issues presented by patient. Her daughter is supportive. SW extended support to patient and daughter. 4. PERSONAL EMERGENCY PLAN:Per facility protocol.   5. COMMUNITY RESOURCES COORDINATION/ HEALTH CARE NAVIGATION: Palliative Care support reinforced to patient and facility staff. Patient has access to the activities program for social support.  6. FINANCIAL/LEGAL CONCERNS/INTERVENTIONS:   3.   No financial or legal concerns noted.      SOCIAL HX:  Social History   Tobacco Use  . Smoking status: Never Smoker  . Smokeless tobacco: Never Used  Substance Use Topics   . Alcohol use: No    CODE STATUS: DNR ADVANCED DIRECTIVES: No MOST FORM COMPLETE: No HOSPICE EDUCATION PROVIDED: No  PPS: Patient is bedbound. She dependent for personal care needs, but is able to feed herself with assistance.  Duration of visit and documentation: 45 minutes      Clydia Llano, LCSW

## 2020-05-22 NOTE — Progress Notes (Deleted)
Assessment/Plan:   Parkinsonism  -I had a long counseling session with the patient today.  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  I explained that one clinically cannot tell the difference between idiopathic parkinsons disease and secondary parkinsonism from medication.  I explained that the diagnosis of Parkinson's disease cannot be made while on medication such as Geodon.  However, many patients are not able to get off of these medications.  I do see that in the past, according to mood, that the patient's movement symptoms got worse when she was off of levodopa.  Technically, one could do a DaTscan to get more information, but I really do not think that that would necessarily change treatment if she is going to stay on the levodopa anyway.  -Discussed also that studies are varied, but incidate that up to 2/3 of patients on lithium will have lithium-induced tremor, even if the patients are not toxic on the medication.  This is just a common side effect of patients on lithium.  I did not recommend that she stop or taper the lithium, but rather gave this for information purposes    Subjective:   Jennifer Garner was seen today in the movement disorders clinic for neurologic consultation at the request of Pecola Lawless, MD.  The consultation is for second opinion the evaluation of parkinsonism.  Patient has seen Dr. Terrace Arabia.  Those notes have been reviewed.  Patient started seeing Dr. Terrace Arabia in November, 2018.  She had been seeing neurology prior to that in [place].  According to Dr. Zannie Cove records, the patient began to have symptoms of psychosis that preceded the diagnosis of parkinsonism.  Psychosis symptoms began in 2014 with visual hallucinations.  Patient was started on various psychotropic medications according to Dr. Zannie Cove notes, including quetiapine and Nuplazid, Geodon.  By 2017, she had parkinsonian features and was started on levodopa.  She  has also been on donepezil.  Dr. Terrace Arabia did not really diagnosed Parkinson's disease on her first visit, and the patient was not on Geodon at the time.  However, when she followed up in January, 2019, Dr. Terrace Arabia restarted her on Geodon.  She has not followed up with Dr. Terrace Arabia since that time.  The Geodon dose has been increased significantly, however since that time.  I did receive a message from Dr. Alwyn Ren in mid May that the patient's daughter requested second opinion from neurology.  He was also going to seek psychiatric input.  Patient is also on lithium.   Specific Symptoms:  Tremor: {yes no:314532} Family hx of similar:  {yes no:314532} Voice: *** Sleep: ***  Vivid Dreams:  {yes no:314532}  Acting out dreams:  {yes no:314532} Wet Pillows: {yes no:314532} Postural symptoms:  {yes no:314532}  Falls?  {yes no:314532} Bradykinesia symptoms: {parkinson brady:18041} Loss of smell:  {yes no:314532} Loss of taste:  {yes no:314532} Urinary Incontinence:  {yes no:314532} Difficulty Swallowing:  {yes no:314532} Handwriting, micrographia: {yes no:314532} Trouble with ADL's:  {yes no:314532}  Trouble buttoning clothing: {yes no:314532} Depression:  {yes no:314532} Memory changes:  {yes no:314532} Hallucinations:  {yes no:314532}  visual distortions: {yes no:314532} N/V:  {yes no:314532} Lightheaded:  {yes no:314532}  Syncope: {yes no:314532} Diplopia:  {yes no:314532} Dyskinesia:  {yes no:314532} Prior exposure to reglan/antipsychotics: {yes no:314532}  Neuroimaging of the brain has *** previously been performed.  It *** available for my review today.  PREVIOUS MEDICATIONS: {Parkinson's RX:18200}  ALLERGIES:   Allergies  Allergen Reactions  .  Hydrocodone Other (See Comments)    GI upset    CURRENT MEDICATIONS:  Current Outpatient Medications  Medication Instructions  . acetaminophen (TYLENOL) 650 mg, Oral, Every 6 hours PRN  . albuterol (PROVENTIL HFA;VENTOLIN HFA) 108 (90 Base)  MCG/ACT inhaler 2 puffs, Inhalation, Every 6 hours PRN  . aspirin EC 81 mg, Oral, Daily  . bisacodyl (BISACODYL LAXATIVE) 10 mg, Rectal, As needed  . busPIRone (BUSPAR) 15 mg, Oral, 3 times daily  . carbidopa-levodopa (SINEMET IR) 25-100 MG tablet 1 tablet, Oral, 3 times daily  . cholecalciferol (VITAMIN D) 1,000 Units, Oral, Daily  . donepezil (ARICEPT) 10 mg, Oral, Daily at bedtime  . DULoxetine (CYMBALTA) 30 MG capsule Give 3 tablets (90mg ) by mouth daily  . fluticasone (FLONASE) 50 MCG/ACT nasal spray 2 sprays, Each Nare, Daily  . Lidocaine (SALONPAS PAIN RELIEVING) 4 % PTCH APPLY 2 PATCHES TOPICALLY TO LOWER BACK ONCE DAILY FOR PAIN (REMOVE AT BEDTIME)   . lithium carbonate 300 mg, Oral, Daily at bedtime  . LORazepam (ATIVAN) 1 mg, Oral, Every 12 hours  . magnesium hydroxide (MILK OF MAGNESIA) 400 MG/5ML suspension If no BM in 3 days, give 30 cc Milk of Magnesium p.o. x 1 dose in 24 hours as needed (Do not use standing constipation orders for residents with renal failure CFR less than 30. Contact MD for orders) (Physician Order)   . memantine (NAMENDA) 10 mg, Oral, 2 times daily  . midodrine (PROAMATINE) 2.5 mg, Oral, 2 times daily PRN, For SBP <=95  . NON FORMULARY Regular /thin diet consistency  . Nutritional Supplement LIQD 120 mLs, Oral, 3 times daily, MedPass  . pantoprazole (PROTONIX) 40 mg, Oral, Daily  . polyethylene glycol (MIRALAX / GLYCOLAX) 17 g, Oral, Daily PRN  . sodium fluoride (PREVIDENT 5000 PLUS) 1.1 % CREA dental cream 1 application, dental, Daily at bedtime  . Sodium Phosphates (RA SALINE ENEMA RE) If not relieved by Biscodyl suppository, give disposable Saline Enema rectally X 1 dose/24 hrs as needed (Do not use constipation standing orders for residents with renal failure/CFR less than 30. Contact MD for orders)(Physician Or   . ziprasidone (GEODON) 80 mg, Oral, 2 times daily    Objective:   VITALS:  There were no vitals filed for this visit.  GEN:  The patient  appears stated age and is in NAD. HEENT:  Normocephalic, atraumatic.  The mucous membranes are moist. The superficial temporal arteries are without ropiness or tenderness. CV:  RRR Lungs:  CTAB Neck/HEME:  There are no carotid bruits bilaterally.  Neurological examination:  Orientation: The patient is alert and oriented x3.  Cranial nerves: There is good facial symmetry. Extraocular muscles are intact. The visual fields are full to confrontational testing. The speech is fluent and clear. Soft palate rises symmetrically and there is no tongue deviation. Hearing is intact to conversational tone. Sensation: Sensation is intact to light and pinprick throughout (facial, trunk, extremities). Vibration is intact at the bilateral big toe. There is no extinction with double simultaneous stimulation. There is no sensory dermatomal level identified. Motor: Strength is 5/5 in the bilateral upper and lower extremities.   Shoulder shrug is equal and symmetric.  There is no pronator drift. Deep tendon reflexes: Deep tendon reflexes are 2/4 at the bilateral biceps, triceps, brachioradialis, patella and achilles. Plantar responses are downgoing bilaterally.  Movement examination: Tone: There is ***tone in the bilateral upper extremities.  The tone in the lower extremities is ***.  Abnormal movements: *** Coordination:  There is ***  decremation with RAM's, *** Gait and Station: The patient has *** difficulty arising out of a deep-seated chair without the use of the hands. The patient's stride length is ***.  The patient has a *** pull test.     I have reviewed and interpreted the following labs independently   Chemistry      Component Value Date/Time   NA 143 04/03/2020 0000   K 3.6 04/03/2020 0000   CL 105 04/03/2020 0000   CO2 26 (A) 04/03/2020 0000   BUN 15 04/03/2020 0000   CREATININE 0.5 04/03/2020 0000   CREATININE 1.14 (H) 12/17/2018 0800   GLU 86 04/03/2020 0000      Component Value Date/Time     CALCIUM 9.6 04/03/2020 0000   ALKPHOS 60 12/26/2019 0000   AST 11 (A) 12/26/2019 0000   ALT 5 (A) 12/26/2019 0000   BILITOT 0.6 12/17/2018 0800      Lab Results  Component Value Date   TSH 4.70 04/30/2019   Lab Results  Component Value Date   WBC 5.5 04/03/2020   HGB 12.2 04/03/2020   HCT 34 (A) 04/03/2020   MCV 93.5 12/17/2018   PLT 262 04/03/2020     Total time spent on today's visit was ***60 minutes, including both face-to-face time and nonface-to-face time.  Time included that spent on review of records (prior notes available to me/labs/imaging if pertinent), discussing treatment and goals, answering patient's questions and coordinating care.  Cc:  Pecola Lawless, MD

## 2020-05-24 ENCOUNTER — Ambulatory Visit: Payer: Medicare Other | Admitting: Neurology

## 2020-05-29 DIAGNOSIS — R1312 Dysphagia, oropharyngeal phase: Secondary | ICD-10-CM | POA: Diagnosis not present

## 2020-05-29 DIAGNOSIS — M6281 Muscle weakness (generalized): Secondary | ICD-10-CM | POA: Diagnosis not present

## 2020-05-29 DIAGNOSIS — R293 Abnormal posture: Secondary | ICD-10-CM | POA: Diagnosis not present

## 2020-05-30 DIAGNOSIS — R1312 Dysphagia, oropharyngeal phase: Secondary | ICD-10-CM | POA: Diagnosis not present

## 2020-05-30 DIAGNOSIS — R293 Abnormal posture: Secondary | ICD-10-CM | POA: Diagnosis not present

## 2020-05-30 DIAGNOSIS — M6281 Muscle weakness (generalized): Secondary | ICD-10-CM | POA: Diagnosis not present

## 2020-05-31 DIAGNOSIS — M6281 Muscle weakness (generalized): Secondary | ICD-10-CM | POA: Diagnosis not present

## 2020-05-31 DIAGNOSIS — R293 Abnormal posture: Secondary | ICD-10-CM | POA: Diagnosis not present

## 2020-05-31 DIAGNOSIS — R1312 Dysphagia, oropharyngeal phase: Secondary | ICD-10-CM | POA: Diagnosis not present

## 2020-06-01 DIAGNOSIS — R293 Abnormal posture: Secondary | ICD-10-CM | POA: Diagnosis not present

## 2020-06-01 DIAGNOSIS — R1312 Dysphagia, oropharyngeal phase: Secondary | ICD-10-CM | POA: Diagnosis not present

## 2020-06-01 DIAGNOSIS — M6281 Muscle weakness (generalized): Secondary | ICD-10-CM | POA: Diagnosis not present

## 2020-06-03 NOTE — Progress Notes (Signed)
Assessment/Plan:   1.  Parkinsonism  -I had a long counseling session with the patient/daguhter today.  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  I explained that one clinically cannot tell the difference between idiopathic parkinsons disease and secondary parkinsonism from medication.  I explained that the diagnosis of Parkinson's disease cannot be made while on medication such as Geodon.  However, many patients are not able to get off of these medications.  I do see that in the past, according to mood, that the patient's movement symptoms got worse when she was off of levodopa.  Technically, one could do a DaTscan to get more information, but I really do not think that that would necessarily change treatment if she is going to stay on the levodopa anyway.  Patient's daughter agrees.  She does not want her off of the levodopa.  Did discuss that levodopa could potentially make hallucinations worse, although she is on very low dose.  Decided not to change anything.  -Discussed also that studies are varied, but incidate that up to 2/3 of patients on lithium will have lithium-induced tremor, even if the patients are not toxic on the medication.  This is just a common side effect of patients on lithium.  I did not recommend that she stop or taper the lithium, but rather gave this for information purposes.  2.  Psychosis  -Long history of psychosis, primarily with auditory hallucinations.  Patient's daughter was initially under the impression that I would be able to help with her lorazepam/Geodon/psychiatric meds.  Discussed that I did see that Dr. Alwyn Ren recommended a referral to geriatric psychiatry.  Daughter was unaware of this.  Told her that she should ask about this.  I will also send a note to Dr. Alwyn Ren about that referral.  3.  Patient does not need follow-up here in the neurology clinic unless new neurologic issues arise.    Subjective:    Jennifer Garner was seen today in the movement disorders clinic for neurologic consultation at the request of Pecola Lawless, MD.  The consultation is for second opinion the evaluation of parkinsonism.  Pt with daughter who supplements the history.  EMS/transport also present.  Patient has seen Dr. Terrace Arabia.  Those notes have been reviewed.  Patient started seeing Dr. Terrace Arabia in November, 2018.  She had been seeing neurology prior to that in [place].  According to Dr. Zannie Cove records, the patient began to have symptoms of psychosis that preceded the diagnosis of parkinsonism.  Psychosis symptoms began in 2014 with visual hallucinations.  Patient was started on various psychotropic medications according to Dr. Zannie Cove notes, including quetiapine and Nuplazid, Geodon.  By 2017, she had parkinsonian features and was started on levodopa.  She has also been on donepezil.  Dr. Terrace Arabia did not really diagnosed Parkinson's disease on her first visit, and the patient was not on Geodon at the time.  However, when she followed up in January, 2019, Dr. Terrace Arabia restarted her on Geodon.  She has not followed up with Dr. Terrace Arabia since that time.  The Geodon dose has been increased significantly, however since that time.  I did receive a message from Dr. Alwyn Ren in mid May that the patient's daughter requested second opinion from neurology.  He was also going to seek psychiatric input.  Patient is also on lithium.  Daughter states today that she wants to know if she has a genetic disease.    Pt does  not ambulate.  She has not ambulated since beginning of 2020.  She is bedbound per daughter.  She has chronic LBP and it hurts to sit up in the chair so she is in the bed.  Daughter doesn't know what times of day that she takes the carbidopa/levodopa.  Pt does not feed herself and actually lost 40 lbs during covid due to trouble feeding herself and SNF feeds her now.  Most hallucinations are auditory, but they are better lately.      ALLERGIES:    Allergies  Allergen Reactions  . Hydrocodone Other (See Comments)    GI upset    CURRENT MEDICATIONS:  Current Outpatient Medications  Medication Instructions  . acetaminophen (TYLENOL) 650 mg, Oral, Every 6 hours PRN  . albuterol (PROVENTIL HFA;VENTOLIN HFA) 108 (90 Base) MCG/ACT inhaler 2 puffs, Inhalation, Every 6 hours PRN  . aspirin EC 81 mg, Oral, Daily  . bisacodyl (BISACODYL LAXATIVE) 10 mg, Rectal, As needed  . busPIRone (BUSPAR) 15 mg, Oral, 3 times daily  . carbidopa-levodopa (SINEMET IR) 25-100 MG tablet 1 tablet, Oral, 3 times daily  . cholecalciferol (VITAMIN D) 1,000 Units, Oral, Daily  . donepezil (ARICEPT) 10 mg, Oral, Daily at bedtime  . DULoxetine (CYMBALTA) 30 MG capsule Give 3 tablets (90mg ) by mouth daily  . fluticasone (FLONASE) 50 MCG/ACT nasal spray 2 sprays, Each Nare, Daily  . Lidocaine (SALONPAS PAIN RELIEVING) 4 % PTCH APPLY 2 PATCHES TOPICALLY TO LOWER BACK ONCE DAILY FOR PAIN (REMOVE AT BEDTIME)   . lithium carbonate 300 mg, Oral, Daily at bedtime  . LORazepam (ATIVAN) 1 mg, Oral, Every 12 hours  . magnesium hydroxide (MILK OF MAGNESIA) 400 MG/5ML suspension If no BM in 3 days, give 30 cc Milk of Magnesium p.o. x 1 dose in 24 hours as needed (Do not use standing constipation orders for residents with renal failure CFR less than 30. Contact MD for orders) (Physician Order)   . memantine (NAMENDA) 10 mg, Oral, 2 times daily  . midodrine (PROAMATINE) 2.5 mg, Oral, 2 times daily PRN, For SBP <=95  . NON FORMULARY Regular /thin diet consistency  . Nutritional Supplement LIQD 120 mLs, Oral, 3 times daily, MedPass  . pantoprazole (PROTONIX) 40 mg, Oral, Daily  . polyethylene glycol (MIRALAX / GLYCOLAX) 17 g, Oral, Daily PRN  . sodium fluoride (PREVIDENT 5000 PLUS) 1.1 % CREA dental cream 1 application, dental, Daily at bedtime  . Sodium Phosphates (RA SALINE ENEMA RE) If not relieved by Biscodyl suppository, give disposable Saline Enema rectally X 1 dose/24  hrs as needed (Do not use constipation standing orders for residents with renal failure/CFR less than 30. Contact MD for orders)(Physician Or   . ziprasidone (GEODON) 80 mg, Oral, 2 times daily    Objective:   VITALS:   Vitals:   06/05/20 1314 06/05/20 1335  BP: (!) 100/52 (!) 120/58  Pulse: 75   SpO2: 94%   Weight: 133 lb (60.3 kg)   Height: 5\' 5"  (1.651 m)     GEN:  The patient appears stated age and is in NAD. HEENT:  Normocephalic, atraumatic.  The mucous membranes are moist. The superficial temporal arteries are without ropiness or tenderness. CV:  RRR Lungs:  CTAB Neck/HEME:  There are no carotid bruits bilaterally.  Neurological examination:  Orientation: The patient is intermittently alert (can become somnolent during the visit) and oriented to person only Cranial nerves: There is good facial symmetry.  She tracks about the room.  She does not  participate with formal visual field testing.  Her speech is fluent and clear but lacks spontaneity.  Soft palate rises symmetrically and there is no tongue deviation. Hearing is intact to conversational tone. Sensation: Sensation is intact to light light touch throughout.  She does not participate with more formal aspects of the sensory testing. Motor: Grip strength is good and equal bilaterally.  Strength in the upper extremities is at least 3+-4-.  She does not participate with manual muscle testing.  She is unable to lift the lower extremities off of the stretcher bilaterally.  She is able to move them with 2/5 strength.     Movement examination: Tone: There appears to be mild bilateral increased tone in the upper extremities, but there is also a component of gegenhalten. Abnormal movements: Rare left upper extremity rest tremor.  There may be some postural tremor as well.  She does not participate well with this aspect of the testing. Coordination: When testing this, the patient is significantly apraxic, so unable to test for  decremation.  She is very slow with hand opening and closing and finger taps, but suspect that this is more of a memory issue. Gait and Station: The patient on stretcher and unable to ambulate    I have reviewed and interpreted the following labs independently   Chemistry      Component Value Date/Time   NA 143 04/03/2020 0000   K 3.6 04/03/2020 0000   CL 105 04/03/2020 0000   CO2 26 (A) 04/03/2020 0000   BUN 15 04/03/2020 0000   CREATININE 0.5 04/03/2020 0000   CREATININE 1.14 (H) 12/17/2018 0800   GLU 86 04/03/2020 0000      Component Value Date/Time   CALCIUM 9.6 04/03/2020 0000   ALKPHOS 60 12/26/2019 0000   AST 11 (A) 12/26/2019 0000   ALT 5 (A) 12/26/2019 0000   BILITOT 0.6 12/17/2018 0800      Lab Results  Component Value Date   TSH 4.70 04/30/2019   Lab Results  Component Value Date   WBC 5.5 04/03/2020   HGB 12.2 04/03/2020   HCT 34 (A) 04/03/2020   MCV 93.5 12/17/2018   PLT 262 04/03/2020     Total time spent on today's visit was 60 minutes, including both face-to-face time and nonface-to-face time.  Time included that spent on review of records (prior notes available to me/labs/imaging if pertinent), discussing treatment and goals, answering patient's questions and coordinating care.  Cc:  Pecola Lawless, MD

## 2020-06-04 DIAGNOSIS — R1312 Dysphagia, oropharyngeal phase: Secondary | ICD-10-CM | POA: Diagnosis not present

## 2020-06-04 DIAGNOSIS — R293 Abnormal posture: Secondary | ICD-10-CM | POA: Diagnosis not present

## 2020-06-04 DIAGNOSIS — M6281 Muscle weakness (generalized): Secondary | ICD-10-CM | POA: Diagnosis not present

## 2020-06-05 ENCOUNTER — Encounter: Payer: Self-pay | Admitting: Neurology

## 2020-06-05 ENCOUNTER — Other Ambulatory Visit: Payer: Self-pay

## 2020-06-05 ENCOUNTER — Ambulatory Visit (INDEPENDENT_AMBULATORY_CARE_PROVIDER_SITE_OTHER): Payer: Medicare Other | Admitting: Neurology

## 2020-06-05 VITALS — BP 120/58 | HR 75 | Ht 65.0 in | Wt 133.0 lb

## 2020-06-05 DIAGNOSIS — G2111 Neuroleptic induced parkinsonism: Secondary | ICD-10-CM | POA: Diagnosis not present

## 2020-06-05 DIAGNOSIS — R293 Abnormal posture: Secondary | ICD-10-CM | POA: Diagnosis not present

## 2020-06-05 DIAGNOSIS — R1312 Dysphagia, oropharyngeal phase: Secondary | ICD-10-CM | POA: Diagnosis not present

## 2020-06-05 DIAGNOSIS — R440 Auditory hallucinations: Secondary | ICD-10-CM

## 2020-06-05 DIAGNOSIS — M6281 Muscle weakness (generalized): Secondary | ICD-10-CM | POA: Diagnosis not present

## 2020-06-05 NOTE — Patient Instructions (Signed)
Ask Dr. Alwyn Ren about the referral to geriatric psychiatrist.    I don't think that you have idiopathic Parkinsons disease but rather secondary parkinsonism from side effect from medication (geodon)

## 2020-06-06 DIAGNOSIS — M6281 Muscle weakness (generalized): Secondary | ICD-10-CM | POA: Diagnosis not present

## 2020-06-06 DIAGNOSIS — R293 Abnormal posture: Secondary | ICD-10-CM | POA: Diagnosis not present

## 2020-06-06 DIAGNOSIS — R1312 Dysphagia, oropharyngeal phase: Secondary | ICD-10-CM | POA: Diagnosis not present

## 2020-06-07 DIAGNOSIS — R1312 Dysphagia, oropharyngeal phase: Secondary | ICD-10-CM | POA: Diagnosis not present

## 2020-06-07 DIAGNOSIS — M6281 Muscle weakness (generalized): Secondary | ICD-10-CM | POA: Diagnosis not present

## 2020-06-07 DIAGNOSIS — R293 Abnormal posture: Secondary | ICD-10-CM | POA: Diagnosis not present

## 2020-06-08 DIAGNOSIS — R1312 Dysphagia, oropharyngeal phase: Secondary | ICD-10-CM | POA: Diagnosis not present

## 2020-06-08 DIAGNOSIS — R293 Abnormal posture: Secondary | ICD-10-CM | POA: Diagnosis not present

## 2020-06-08 DIAGNOSIS — M6281 Muscle weakness (generalized): Secondary | ICD-10-CM | POA: Diagnosis not present

## 2020-06-11 DIAGNOSIS — R293 Abnormal posture: Secondary | ICD-10-CM | POA: Diagnosis not present

## 2020-06-11 DIAGNOSIS — R1312 Dysphagia, oropharyngeal phase: Secondary | ICD-10-CM | POA: Diagnosis not present

## 2020-06-11 DIAGNOSIS — M6281 Muscle weakness (generalized): Secondary | ICD-10-CM | POA: Diagnosis not present

## 2020-06-12 ENCOUNTER — Encounter: Payer: Self-pay | Admitting: Adult Health

## 2020-06-12 ENCOUNTER — Non-Acute Institutional Stay (SKILLED_NURSING_FACILITY): Payer: Medicare Other | Admitting: Adult Health

## 2020-06-12 DIAGNOSIS — R1312 Dysphagia, oropharyngeal phase: Secondary | ICD-10-CM | POA: Diagnosis not present

## 2020-06-12 DIAGNOSIS — M6281 Muscle weakness (generalized): Secondary | ICD-10-CM | POA: Diagnosis not present

## 2020-06-12 DIAGNOSIS — G20 Parkinson's disease: Secondary | ICD-10-CM

## 2020-06-12 DIAGNOSIS — Z7189 Other specified counseling: Secondary | ICD-10-CM | POA: Diagnosis not present

## 2020-06-12 DIAGNOSIS — F251 Schizoaffective disorder, depressive type: Secondary | ICD-10-CM | POA: Diagnosis not present

## 2020-06-12 DIAGNOSIS — R293 Abnormal posture: Secondary | ICD-10-CM | POA: Diagnosis not present

## 2020-06-12 NOTE — Progress Notes (Signed)
Location:  [facility] Room Number: 225-B Place of Service:  SNF (31) Provider:  Kenard Gower, DNP, MSN, FNP-BC  Patient Care Team: Pecola Lawless, MD as PCP - General (Internal Medicine) Center, [facility] (Skilled Nursing Facility) Synetta Shadow as Physician Assistant (Internal Medicine)  Extended Emergency Contact Information Primary Emergency Contact: [name] of [place] Home Phone: [phone] Relation: Daughter Secondary Emergency Contact: [name] [place] of [place] Home Phone: [phone] Relation: Daughter  Code Status:  FULL CODE  Goals of care: Advanced Directive information Advanced Directives 06/05/2020  Does Patient Have a Medical Advance Directive? Unable to assess, patient is non-responsive or altered mental status  Type of Advance Directive -  Does patient want to make changes to medical advance directive? -  Would patient like information on creating a medical advance directive? -  Pre-existing out of facility DNR order (yellow form or pink MOST form) -     Chief Complaint  Patient presents with  . Acute Visit    Patient is seen at the request of patient's daughter to discuss medical concerns she has.     HPI:  Pt is a 74 y.o. female seen today at the request of her daughter Jennifer Garner to discuss medical concerns.  She is a long-term care resident of [facility].  He has a PMH of Parkinson's disease with hallucinations, TIA/stroke, dyslipidemia, GERD , fibromyalgia, dementia and history of asthma. On 06/05/20, she had a consult with a neurologist, Dr. 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  And cannot tell the difference between idiopathic Parkinson's disease and secondary parkinsonism from medication.  Daughter does not want her to be off from levodopa.  It was discussed that levodopa could potentially make hallucinations  worse, although she is on very low-dose.  Daughter decided not to change anything.  Talked to Altha Harm, daughter, and discussed about resident's medications. In the past, daughter does not want resident's medications to be changed due to possibility of having hallucinations. Mindy stated that when she was still able to call her with the cell phone, she would call her in the middle of the night and in fear of somebody is running after her. Mindy stated that she thinks that she has nightmares and then would call her and thought that they are really happening.   Discussed that we have a psych NP in-house. Mindy requested to talk to Jacqulyn Bath, psych NP, to discuss her psychotropics.   Past Medical History:  Diagnosis Date  . Allergy   . Anxiety   . Arthritis   . Asthma   . Benign essential hypertension 03/18/2017  . Cataract   . Dementia (HCC)   . Dementia with Lewy bodies (CODE) (HCC)   . Depression   . Difficulty walking   . Dysphagia, oral phase   . Fibromyalgia   . GERD (gastroesophageal reflux disease)   . Hallucinations   . Hyperlipidemia   . Lower extremity edema   . Repeated falls   . Rhabdomyolysis   . Rhabdomyolysis   . Stroke (HCC)   . TIA (transient ischemic attack)   . Urinary incontinence    Past Surgical History:  Procedure Laterality Date  . ABDOMINAL HYSTERECTOMY    . CATARACT EXTRACTION    . CHOLECYSTECTOMY    . TONSILLECTOMY AND ADENOIDECTOMY      Allergies  Allergen Reactions  . Hydrocodone Other (See Comments)  GI upset    Outpatient Encounter Medications as of 06/12/2020  Medication Sig  . acetaminophen (TYLENOL) 325 MG tablet Take 650 mg by mouth every 6 (six) hours as needed for mild pain or moderate pain.  Marland Kitchen albuterol (PROVENTIL HFA;VENTOLIN HFA) 108 (90 Base) MCG/ACT inhaler Inhale 2 puffs into the lungs every 6 (six) hours as needed for wheezing or shortness of breath.  Marland Kitchen aspirin EC 81 MG tablet Take 81 mg by mouth daily.  . bisacodyl  (BISACODYL LAXATIVE) 10 MG suppository Place 10 mg rectally as needed for moderate constipation.  . busPIRone (BUSPAR) 15 MG tablet Take 15 mg by mouth 3 (three) times daily.  . carbidopa-levodopa (SINEMET IR) 25-100 MG tablet Take 1 tablet by mouth 3 (three) times daily.  . cholecalciferol (VITAMIN D) 1000 units tablet Take 1,000 Units by mouth daily.   Marland Kitchen donepezil (ARICEPT) 10 MG tablet Take 10 mg by mouth at bedtime.   . DULoxetine (CYMBALTA) 30 MG capsule Give 3 tablets (90mg ) by mouth daily  . fluticasone (FLONASE) 50 MCG/ACT nasal spray Place 2 sprays into both nostrils daily.   . Lidocaine (SALONPAS PAIN RELIEVING) 4 % PTCH APPLY 2 PATCHES TOPICALLY TO LOWER BACK ONCE DAILY FOR PAIN (REMOVE AT BEDTIME)  . lithium carbonate 300 MG capsule Take 300 mg by mouth at bedtime.  LORazepam (ATIVAN) 1 MG tablet Take 1 tablet (1 mg total) by mouth every 12 (twelve) hours.  . magnesium hydroxide (MILK OF MAGNESIA) 400 MG/5ML suspension If no BM in 3 days, give 30 cc Milk of Magnesium p.o. x 1 dose in 24 hours as needed (Do not use standing constipation orders for residents with renal failure CFR less than 30. Contact MD for orders) (Physician Order)  . memantine (NAMENDA) 10 MG tablet Take 10 mg by mouth 2 (two) times daily.   . midodrine (PROAMATINE) 2.5 MG tablet Take 1 tablet (2.5 mg total) by mouth 2 (two) times daily as needed. For SBP <=95  . NON FORMULARY Regular /thin diet consistency  . Nutritional Supplement LIQD Take 120 mLs by mouth 3 (three) times daily. MedPass   . pantoprazole (PROTONIX) 40 MG tablet Take 40 mg by mouth daily.   . polyethylene glycol (MIRALAX / GLYCOLAX) packet Take 17 g by mouth daily as needed.   . sodium fluoride (PREVIDENT 5000 PLUS) 1.1 % CREA dental cream Place 1 application onto teeth at bedtime.   . Sodium Phosphates (RA SALINE ENEMA RE) If not relieved by Biscodyl suppository, give disposable Saline Enema rectally X 1 dose/24 hrs as needed (Do not use  constipation standing orders for residents with renal failure/CFR less than 30. Contact MD for orders)(Physician Or  . ziprasidone (GEODON) 80 MG capsule Take 80 mg by mouth 2 (two) times daily.    No facility-administered encounter medications on file as of 06/12/2020.    Review of Systems  GENERAL: No change in appetite, no fatigue, no weight changes, no fever, chills or weakness MOUTH and THROAT: Denies oral discomfort, gingival pain or bleeding RESPIRATORY: no cough, SOB, DOE, wheezing, hemoptysis CARDIAC: No chest pain, edema or palpitations GI: No abdominal pain, diarrhea, constipation, heart burn, nausea or vomiting GU: Denies dysuria, frequency, hematuria or discharge NEUROLOGICAL: Denies dizziness, syncope, numbness, or headache PSYCHIATRIC: Denies feelings of depression or anxiety. No report of hallucinations, insomnia, paranoia, or agitation   Immunization History  Administered Date(s) Administered  . Influenza-Unspecified 03/17/2017, 09/09/2017, 08/17/2018, 08/29/2019  . Moderna SARS-COVID-2 Vaccination 12/05/2019, 01/02/2020  . PPD Test  03/24/2017, 06/02/2018, 06/08/2018  . Pneumococcal Polysaccharide-23 03/17/2017, 06/12/2018   Pertinent  Health Maintenance Due  Topic Date Due  . DEXA SCAN  Never done  . PNA vac Low Risk Adult (2 of 2 - PCV13) 06/13/2019  . INFLUENZA VACCINE  06/17/2020   Fall Risk  05/27/2018 05/14/2017  Falls in the past year? No Yes  Number falls in past yr: - 2 or more  Injury with Fall? - No     Vitals:   06/12/20 1238  BP: 119/84  Pulse: 77  Resp: 16  Temp: 98.4 F (36.9 C)  TempSrc: Oral  Weight: 133 lb 12.8 oz (60.7 kg)  Height: 5\' 5"  (1.651 m)   Body mass index is 22.27 kg/m.  Physical Exam  GENERAL APPEARANCE: Well nourished. In no acute distress. Normal body habitus SKIN:  Skin is warm and dry.  MOUTH and THROAT: Lips are without lesions. Oral mucosa is moist and without lesions. Tongue is normal in shape, size, and color  and without lesions RESPIRATORY: Breathing is even & unlabored, BS CTAB CARDIAC: RRR, no murmur,no extra heart sounds, no edema GI: Abdomen soft, normal BS, no masses, no tenderness NEUROLOGICAL: + tremor. Speech is clear. Alert to self, disoriented to time and place.  PSYCHIATRIC:  Affect and behavior are appropriate  Labs reviewed: Recent Labs    12/22/19 0000 12/26/19 0000 04/03/20 0000  NA 142 144 143  K 3.5 3.9 3.6  CL 106 106 105  CO2 29* 27* 26*  BUN 15 15 15   CREATININE 0.6 0.5 0.5  CALCIUM 9.3 8.7 9.6   Recent Labs    12/22/19 0000 12/26/19 0000  AST 13 11*  ALT 7 5*  ALKPHOS 65 60  ALBUMIN 3.8 3.5   Recent Labs    12/22/19 0000 12/26/19 0000 04/03/20 0000  WBC 5.1 5.7 5.5  NEUTROABS  --   --  3  HGB 12.6 11.8* 12.2  HCT 37 33* 34*  PLT 256 267 262   Lab Results  Component Value Date   TSH 4.70 04/30/2019   Lab Results  Component Value Date   HGBA1C 5.4 03/15/2018   Lab Results  Component Value Date   CHOL 139 03/15/2018   HDL 46 03/15/2018   LDLCALC 74 03/15/2018   TRIG 97 03/15/2018    Assessment/Plan  1. Other specified counseling - discussed medications -  She agreed to talk to psych NP and discuss medications, possible dosage reduction  2. Parkinson disease (HCC) -Continue carbidopa-levodopa  3. Schizoaffective disorder, depressive type (HCC) -  Mood is stable, continue duloxetine, lithium carbonate, Ativan, buspirone and ziprasidone -  Will set up appointment with psych NP     Family/ staff Communication: Discussed plan of care with daughter, 03/17/2018.  Labs/tests ordered:  None  Goals of care:  Long-term care  03/17/2018, DNP, FNP-BC [facility] and [facility] [phone] (Monday-Friday 8:00 a.m. - 5:00 p.m.) [phone] (after hours)

## 2020-06-13 DIAGNOSIS — R293 Abnormal posture: Secondary | ICD-10-CM | POA: Diagnosis not present

## 2020-06-13 DIAGNOSIS — R1312 Dysphagia, oropharyngeal phase: Secondary | ICD-10-CM | POA: Diagnosis not present

## 2020-06-13 DIAGNOSIS — M6281 Muscle weakness (generalized): Secondary | ICD-10-CM | POA: Diagnosis not present

## 2020-06-14 ENCOUNTER — Non-Acute Institutional Stay (SKILLED_NURSING_FACILITY): Payer: Medicare Other | Admitting: Adult Health

## 2020-06-14 ENCOUNTER — Encounter: Payer: Self-pay | Admitting: Adult Health

## 2020-06-14 DIAGNOSIS — G3183 Dementia with Lewy bodies: Secondary | ICD-10-CM | POA: Diagnosis not present

## 2020-06-14 DIAGNOSIS — F0150 Vascular dementia without behavioral disturbance: Secondary | ICD-10-CM

## 2020-06-14 DIAGNOSIS — R293 Abnormal posture: Secondary | ICD-10-CM | POA: Diagnosis not present

## 2020-06-14 DIAGNOSIS — F0280 Dementia in other diseases classified elsewhere without behavioral disturbance: Secondary | ICD-10-CM | POA: Diagnosis not present

## 2020-06-14 DIAGNOSIS — F251 Schizoaffective disorder, depressive type: Secondary | ICD-10-CM | POA: Diagnosis not present

## 2020-06-14 DIAGNOSIS — K219 Gastro-esophageal reflux disease without esophagitis: Secondary | ICD-10-CM | POA: Diagnosis not present

## 2020-06-14 DIAGNOSIS — G2119 Other drug induced secondary parkinsonism: Secondary | ICD-10-CM

## 2020-06-14 DIAGNOSIS — G903 Multi-system degeneration of the autonomic nervous system: Secondary | ICD-10-CM

## 2020-06-14 DIAGNOSIS — M6281 Muscle weakness (generalized): Secondary | ICD-10-CM | POA: Diagnosis not present

## 2020-06-14 DIAGNOSIS — R1312 Dysphagia, oropharyngeal phase: Secondary | ICD-10-CM | POA: Diagnosis not present

## 2020-06-14 DIAGNOSIS — F329 Major depressive disorder, single episode, unspecified: Secondary | ICD-10-CM | POA: Diagnosis not present

## 2020-06-14 DIAGNOSIS — F259 Schizoaffective disorder, unspecified: Secondary | ICD-10-CM | POA: Diagnosis not present

## 2020-06-14 DIAGNOSIS — F419 Anxiety disorder, unspecified: Secondary | ICD-10-CM | POA: Diagnosis not present

## 2020-06-14 DIAGNOSIS — J4520 Mild intermittent asthma, uncomplicated: Secondary | ICD-10-CM | POA: Diagnosis not present

## 2020-06-14 NOTE — Progress Notes (Signed)
Location:  [facility] Room Number: 225-B Place of Service:  SNF (31) Provider:  Kenard Gower, DNP, FNP-BC  Patient Care Team: Pecola Lawless, MD as PCP - General (Internal Medicine) Center, [facility] ([facility]) Synetta Shadow as Physician Assistant (Internal Medicine)  Extended Emergency Contact Information Primary Emergency Contact: [name] of [place] Home Phone: [phone] Relation: Daughter Secondary Emergency Contact: [name] [place] of [place] Home Phone: [phone] Relation: Daughter  Code Status:  FULL CODE  Goals of care: Advanced Directive information Advanced Directives 06/05/2020  Does Patient Have a Medical Advance Directive? Unable to assess, patient is non-responsive or altered mental status  Type of Advance Directive -  Does patient want to make changes to medical advance directive? -  Would patient like information on creating a medical advance directive? -  Pre-existing out of facility DNR order (yellow form or pink MOST form) -     Chief Complaint  Patient presents with   Medical Management of Chronic Issues    Routine [facility] visit    HPI:  Pt is a 74 y.o. female seen today for medical management of chronic diseases.  She is a long-term care resident of [facility].  She has a PMH of TIA, stroke, Parkinson's disease with hallucinations, dyslipidemia and history of asthma. She was seen in the room today. She denies any concerns.  SBPs ranging from 94-133.  She currently takes PRN midodrine for hypotension.  She denies having ICD reflux.  She takes pantoprazole for GERD.   Past Medical History:  Diagnosis Date   Allergy    Anxiety    Arthritis    Asthma    Benign essential hypertension 03/18/2017   Cataract    Dementia (HCC)    Dementia with Lewy bodies (CODE) (HCC)    Depression    Difficulty walking     Dysphagia, oral phase    Fibromyalgia    GERD (gastroesophageal reflux disease)    Hallucinations    Hyperlipidemia    Lower extremity edema    Repeated falls    Rhabdomyolysis    Rhabdomyolysis    Stroke (HCC)    TIA (transient ischemic attack)    Urinary incontinence    Past Surgical History:  Procedure Laterality Date   ABDOMINAL HYSTERECTOMY     CATARACT EXTRACTION     CHOLECYSTECTOMY     TONSILLECTOMY AND ADENOIDECTOMY      Allergies  Allergen Reactions   Hydrocodone Other (See Comments)    GI upset    Outpatient Encounter Medications as of 06/14/2020  Medication Sig   acetaminophen (TYLENOL) 325 MG tablet Take 650 mg by mouth every 6 (six) hours as needed for mild pain or moderate pain.   albuterol (PROVENTIL HFA;VENTOLIN HFA) 108 (90 Base) MCG/ACT inhaler Inhale 2 puffs into the lungs every 6 (six) hours as needed for wheezing or shortness of breath.   aspirin EC 81 MG tablet Take 81 mg by mouth daily.   bisacodyl (BISACODYL LAXATIVE) 10 MG suppository Place 10 mg rectally as needed for moderate constipation.   busPIRone (BUSPAR) 15 MG tablet Take 15 mg by mouth 3 (three) times daily.   carbidopa-levodopa (SINEMET IR) 25-100 MG tablet Take 1 tablet by mouth 3 (three) times daily.   cholecalciferol (VITAMIN D) 1000 units tablet Take 1,000 Units by mouth daily.    donepezil (ARICEPT) 10 MG tablet Take 10 mg by mouth at bedtime.  DULoxetine (CYMBALTA) 30 MG capsule Give 3 tablets (90mg ) by mouth daily   fluticasone (FLONASE) 50 MCG/ACT nasal spray Place 2 sprays into both nostrils daily.    Lidocaine (SALONPAS PAIN RELIEVING) 4 % PTCH APPLY 2 PATCHES TOPICALLY TO LOWER BACK ONCE DAILY FOR PAIN (REMOVE AT BEDTIME)   lithium carbonate 300 MG capsule Take 300 mg by mouth at bedtime.   LORazepam (ATIVAN) 1 MG tablet Take 1 tablet (1 mg total) by mouth every 12 (twelve) hours.   magnesium hydroxide (MILK OF MAGNESIA) 400 MG/5ML suspension If no  BM in 3 days, give 30 cc Milk of Magnesium p.o. x 1 dose in 24 hours as needed (Do not use standing constipation orders for residents with renal failure CFR less than 30. Contact MD for orders) (Physician Order)   memantine (NAMENDA) 10 MG tablet Take 10 mg by mouth 2 (two) times daily.    midodrine (PROAMATINE) 2.5 MG tablet Take 1 tablet (2.5 mg total) by mouth 2 (two) times daily as needed. For SBP <=95   NON FORMULARY Regular /thin diet consistency   Nutritional Supplement LIQD Take 120 mLs by mouth 3 (three) times daily. MedPass    pantoprazole (PROTONIX) 40 MG tablet Take 40 mg by mouth daily.    polyethylene glycol (MIRALAX / GLYCOLAX) packet Take 17 g by mouth daily as needed.    sodium fluoride (PREVIDENT 5000 PLUS) 1.1 % CREA dental cream Place 1 application onto teeth at bedtime.    Sodium Phosphates (RA SALINE ENEMA RE) If not relieved by Biscodyl suppository, give disposable Saline Enema rectally X 1 dose/24 hrs as needed (Do not use constipation standing orders for residents with renal failure/CFR less than 30. Contact MD for orders)(Physician Or   ziprasidone (GEODON) 80 MG capsule Take 80 mg by mouth 2 (two) times daily.    No facility-administered encounter medications on file as of 06/14/2020.    Review of Systems  GENERAL: No change in appetite, no fatigue, no weight changes, no fever, chills or weakness MOUTH and THROAT: Denies oral discomfort, gingival pain or bleeding RESPIRATORY: no cough, SOB, DOE, wheezing, hemoptysis CARDIAC: No chest pain, edema or palpitations GI: No abdominal pain, diarrhea, constipation, heart burn, nausea or vomiting GU: Denies dysuria, frequency, hematuria or discharge NEUROLOGICAL: Denies dizziness, syncope, numbness, or headache PSYCHIATRIC: Denies feelings of depression or anxiety. No report of hallucinations, insomnia, paranoia, or agitation   Immunization History  Administered Date(s) Administered   Influenza-Unspecified  03/17/2017, 09/09/2017, 08/17/2018, 08/29/2019   Moderna SARS-COVID-2 Vaccination 12/05/2019, 01/02/2020   PPD Test 03/24/2017, 06/02/2018, 06/08/2018   Pneumococcal Polysaccharide-23 03/17/2017, 06/12/2018   Pertinent  Health Maintenance Due  Topic Date Due   DEXA SCAN  Never done   PNA vac Low Risk Adult (2 of 2 - PCV13) 06/13/2019   INFLUENZA VACCINE  06/17/2020   Fall Risk  05/27/2018 05/14/2017  Falls in the past year? No Yes  Number falls in past yr: - 2 or more  Injury with Fall? - No     Vitals:   06/14/20 1653  BP: (!) 92/54  Pulse: 68  Resp: 18  Temp: 98 F (36.7 C)  TempSrc: Oral  Weight: 133 lb 12.8 oz (60.7 kg)  Height: 5\' 5"  (1.651 m)   Body mass index is 22.27 kg/m.  Physical Exam  GENERAL APPEARANCE: Well nourished. In no acute distress. Normal body habitus SKIN:  Skin is warm and dry.  MOUTH and THROAT: Lips are without lesions. Oral mucosa is moist and  without lesions. Tongue is normal in shape, size, and color and without lesions RESPIRATORY: Breathing is even & unlabored, BS CTAB CARDIAC: RRR, no murmur,no extra heart sounds, no edema GI: Abdomen soft, normal BS, no masses, no tenderness NEUROLOGICAL: +tremors. Speech is clear. Alert to self, disoriented to time and place. PSYCHIATRIC:  Affect and behavior are appropriate  Labs reviewed: Recent Labs    12/22/19 0000 12/26/19 0000 04/03/20 0000  NA 142 144 143  K 3.5 3.9 3.6  CL 106 106 105  CO2 29* 27* 26*  BUN 15 15 15   CREATININE 0.6 0.5 0.5  CALCIUM 9.3 8.7 9.6   Recent Labs    12/22/19 0000 12/26/19 0000  AST 13 11*  ALT 7 5*  ALKPHOS 65 60  ALBUMIN 3.8 3.5   Recent Labs    12/22/19 0000 12/26/19 0000 04/03/20 0000  WBC 5.1 5.7 5.5  NEUTROABS  --   --  3  HGB 12.6 11.8* 12.2  HCT 37 33* 34*  PLT 256 267 262   Lab Results  Component Value Date   TSH 4.70 04/30/2019   Lab Results  Component Value Date   HGBA1C 5.4 03/15/2018   Lab Results  Component Value  Date   CHOL 139 03/15/2018   HDL 46 03/15/2018   LDLCALC 74 03/15/2018   TRIG 97 03/15/2018    Assessment/Plan  1. GERD without esophagitis -Stable, continue pantoprazole  2. 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 Schizoaffective disorder, depressive type (HCC) -Mood this is stable, continue ziprasidone, ibuprofen, buspirone and duloxetine -Followed by psych NP  4. Mild intermittent asthma with allergic rhinitis without complication - Continue fluticasone nasal spray  5. Neurogenic orthostatic hypotension (HCC) -Continue PRN midodrine  6. Vascular dementia without behavioral disturbance (HCC) -Continue memantine and donepezil  -Continue supportive care    Family/ staff Communication: Discussed plan of care with resident and charge nurse.  Labs/tests ordered: None  Goals of care:   Long-term care   06/07/2020, DNP, FNP-BC [facility] and Adult Medicine [phone] (Monday-Friday 8:00 a.m. - 5:00 p.m.) [phone] (after hours)

## 2020-06-15 ENCOUNTER — Non-Acute Institutional Stay: Payer: Medicare Other

## 2020-06-15 ENCOUNTER — Other Ambulatory Visit: Payer: Self-pay

## 2020-06-15 DIAGNOSIS — R1312 Dysphagia, oropharyngeal phase: Secondary | ICD-10-CM | POA: Diagnosis not present

## 2020-06-15 DIAGNOSIS — M6281 Muscle weakness (generalized): Secondary | ICD-10-CM | POA: Diagnosis not present

## 2020-06-15 DIAGNOSIS — R293 Abnormal posture: Secondary | ICD-10-CM | POA: Diagnosis not present

## 2020-06-15 DIAGNOSIS — Z515 Encounter for palliative care: Secondary | ICD-10-CM

## 2020-06-17 DIAGNOSIS — R293 Abnormal posture: Secondary | ICD-10-CM | POA: Diagnosis not present

## 2020-06-17 DIAGNOSIS — R1312 Dysphagia, oropharyngeal phase: Secondary | ICD-10-CM | POA: Diagnosis not present

## 2020-06-17 DIAGNOSIS — M6281 Muscle weakness (generalized): Secondary | ICD-10-CM | POA: Diagnosis not present

## 2020-06-18 DIAGNOSIS — M6281 Muscle weakness (generalized): Secondary | ICD-10-CM | POA: Diagnosis not present

## 2020-06-18 DIAGNOSIS — R293 Abnormal posture: Secondary | ICD-10-CM | POA: Diagnosis not present

## 2020-06-18 DIAGNOSIS — R1312 Dysphagia, oropharyngeal phase: Secondary | ICD-10-CM | POA: Diagnosis not present

## 2020-06-19 DIAGNOSIS — M6281 Muscle weakness (generalized): Secondary | ICD-10-CM | POA: Diagnosis not present

## 2020-06-19 DIAGNOSIS — R293 Abnormal posture: Secondary | ICD-10-CM | POA: Diagnosis not present

## 2020-06-19 DIAGNOSIS — R1312 Dysphagia, oropharyngeal phase: Secondary | ICD-10-CM | POA: Diagnosis not present

## 2020-06-20 DIAGNOSIS — M6281 Muscle weakness (generalized): Secondary | ICD-10-CM | POA: Diagnosis not present

## 2020-06-20 DIAGNOSIS — R1312 Dysphagia, oropharyngeal phase: Secondary | ICD-10-CM | POA: Diagnosis not present

## 2020-06-20 DIAGNOSIS — R293 Abnormal posture: Secondary | ICD-10-CM | POA: Diagnosis not present

## 2020-06-21 DIAGNOSIS — M6281 Muscle weakness (generalized): Secondary | ICD-10-CM | POA: Diagnosis not present

## 2020-06-21 DIAGNOSIS — R1312 Dysphagia, oropharyngeal phase: Secondary | ICD-10-CM | POA: Diagnosis not present

## 2020-06-21 DIAGNOSIS — R293 Abnormal posture: Secondary | ICD-10-CM | POA: Diagnosis not present

## 2020-06-22 DIAGNOSIS — R293 Abnormal posture: Secondary | ICD-10-CM | POA: Diagnosis not present

## 2020-06-22 DIAGNOSIS — M6281 Muscle weakness (generalized): Secondary | ICD-10-CM | POA: Diagnosis not present

## 2020-06-22 DIAGNOSIS — R1312 Dysphagia, oropharyngeal phase: Secondary | ICD-10-CM | POA: Diagnosis not present

## 2020-06-25 DIAGNOSIS — R293 Abnormal posture: Secondary | ICD-10-CM | POA: Diagnosis not present

## 2020-06-25 DIAGNOSIS — M6281 Muscle weakness (generalized): Secondary | ICD-10-CM | POA: Diagnosis not present

## 2020-06-25 DIAGNOSIS — R1312 Dysphagia, oropharyngeal phase: Secondary | ICD-10-CM | POA: Diagnosis not present

## 2020-06-26 DIAGNOSIS — R293 Abnormal posture: Secondary | ICD-10-CM | POA: Diagnosis not present

## 2020-06-26 DIAGNOSIS — R1312 Dysphagia, oropharyngeal phase: Secondary | ICD-10-CM | POA: Diagnosis not present

## 2020-06-26 DIAGNOSIS — M6281 Muscle weakness (generalized): Secondary | ICD-10-CM | POA: Diagnosis not present

## 2020-06-27 DIAGNOSIS — R293 Abnormal posture: Secondary | ICD-10-CM | POA: Diagnosis not present

## 2020-06-27 DIAGNOSIS — M6281 Muscle weakness (generalized): Secondary | ICD-10-CM | POA: Diagnosis not present

## 2020-06-27 DIAGNOSIS — R1312 Dysphagia, oropharyngeal phase: Secondary | ICD-10-CM | POA: Diagnosis not present

## 2020-06-27 NOTE — Progress Notes (Signed)
Opened in error

## 2020-06-28 DIAGNOSIS — M6281 Muscle weakness (generalized): Secondary | ICD-10-CM | POA: Diagnosis not present

## 2020-06-28 DIAGNOSIS — R293 Abnormal posture: Secondary | ICD-10-CM | POA: Diagnosis not present

## 2020-06-28 DIAGNOSIS — R1312 Dysphagia, oropharyngeal phase: Secondary | ICD-10-CM | POA: Diagnosis not present

## 2020-06-29 DIAGNOSIS — M6281 Muscle weakness (generalized): Secondary | ICD-10-CM | POA: Diagnosis not present

## 2020-06-29 DIAGNOSIS — R293 Abnormal posture: Secondary | ICD-10-CM | POA: Diagnosis not present

## 2020-06-29 DIAGNOSIS — R1312 Dysphagia, oropharyngeal phase: Secondary | ICD-10-CM | POA: Diagnosis not present

## 2020-07-02 DIAGNOSIS — M6281 Muscle weakness (generalized): Secondary | ICD-10-CM | POA: Diagnosis not present

## 2020-07-02 DIAGNOSIS — R1312 Dysphagia, oropharyngeal phase: Secondary | ICD-10-CM | POA: Diagnosis not present

## 2020-07-02 DIAGNOSIS — R293 Abnormal posture: Secondary | ICD-10-CM | POA: Diagnosis not present

## 2020-07-03 DIAGNOSIS — M6281 Muscle weakness (generalized): Secondary | ICD-10-CM | POA: Diagnosis not present

## 2020-07-03 DIAGNOSIS — R293 Abnormal posture: Secondary | ICD-10-CM | POA: Diagnosis not present

## 2020-07-03 DIAGNOSIS — R1312 Dysphagia, oropharyngeal phase: Secondary | ICD-10-CM | POA: Diagnosis not present

## 2020-07-04 DIAGNOSIS — R293 Abnormal posture: Secondary | ICD-10-CM | POA: Diagnosis not present

## 2020-07-04 DIAGNOSIS — R1312 Dysphagia, oropharyngeal phase: Secondary | ICD-10-CM | POA: Diagnosis not present

## 2020-07-04 DIAGNOSIS — M6281 Muscle weakness (generalized): Secondary | ICD-10-CM | POA: Diagnosis not present

## 2020-07-05 ENCOUNTER — Other Ambulatory Visit: Payer: Self-pay | Admitting: Adult Health

## 2020-07-05 MED ORDER — LORAZEPAM 1 MG PO TABS: 1.0000 mg | ORAL_TABLET | Freq: Two times a day (BID) | ORAL | 0 refills | Status: DC

## 2020-07-06 ENCOUNTER — Encounter: Payer: Self-pay | Admitting: Adult Health

## 2020-07-06 ENCOUNTER — Non-Acute Institutional Stay (SKILLED_NURSING_FACILITY): Payer: Medicare Other | Admitting: Adult Health

## 2020-07-06 DIAGNOSIS — R634 Abnormal weight loss: Secondary | ICD-10-CM | POA: Diagnosis not present

## 2020-07-06 DIAGNOSIS — F251 Schizoaffective disorder, depressive type: Secondary | ICD-10-CM | POA: Diagnosis not present

## 2020-07-06 DIAGNOSIS — J4520 Mild intermittent asthma, uncomplicated: Secondary | ICD-10-CM

## 2020-07-06 DIAGNOSIS — F333 Major depressive disorder, recurrent, severe with psychotic symptoms: Secondary | ICD-10-CM | POA: Diagnosis not present

## 2020-07-06 DIAGNOSIS — F0150 Vascular dementia without behavioral disturbance: Secondary | ICD-10-CM | POA: Diagnosis not present

## 2020-07-06 DIAGNOSIS — G2119 Other drug induced secondary parkinsonism: Secondary | ICD-10-CM

## 2020-07-06 NOTE — Progress Notes (Signed)
Location:  [facility] Room Number: 225-B Place of Service:  SNF (31) Provider:  Kenard Gower, DNP, FNP-BC  Patient Care Team: Pecola Lawless, MD as PCP - General (Internal Medicine) Center, [facility] (Skilled Nursing Facility) Synetta Shadow as Physician Assistant (Internal Medicine)  Extended Emergency Contact Information Primary Emergency Contact: [name] of [place] Home Phone: [phone] Relation: Daughter Secondary Emergency Contact: [name] [place] of [place] Home Phone: [phone] Relation: Daughter  Code Status:  FULL CODE  Goals of care: Advanced Directive information Advanced Directives 06/05/2020  Does Patient Have a Medical Advance Directive? Unable to assess, patient is non-responsive or altered mental status  Type of Advance Directive -  Does patient want to make changes to medical advance directive? -  Would patient like information on creating a medical advance directive? -  Pre-existing out of facility DNR order (yellow form or pink MOST form) -     Chief Complaint  Patient presents with  . Medical Management of Chronic Issues    Routine [facility] visit    HPI:  Pt is a 74 y.o. female seen today for medical management of chronic diseases.  She is a long-term care resident of [facility].  She has a PMH of TIA, stroke, Parkinson's disease with hallucinations, dyslipidemia and history of asthma. Resident was seen in the room today. Latest weight 127.6 lbs which is down 4.63% in 36 days, 6.45% in 132 days and 7.67% in 191 days. She is being followed by palliative care for failure to thrive. No reported wheezing nor SOB. She takes PRN Albuterol HFA and Fluticasone nasal spray for asthma with allergic rhinitis.   Past Medical History:  Diagnosis Date  . Allergy   . Anxiety   . Arthritis   . Asthma   . Benign essential hypertension 03/18/2017  .  Cataract   . Dementia (HCC)   . Dementia with Lewy bodies (CODE) (HCC)   . Depression   . Difficulty walking   . Dysphagia, oral phase   . Fibromyalgia   . GERD (gastroesophageal reflux disease)   . Hallucinations   . Hyperlipidemia   . Lower extremity edema   . Repeated falls   . Rhabdomyolysis   . Rhabdomyolysis   . Stroke (HCC)   . TIA (transient ischemic attack)   . Urinary incontinence    Past Surgical History:  Procedure Laterality Date  . ABDOMINAL HYSTERECTOMY    . CATARACT EXTRACTION    . CHOLECYSTECTOMY    . TONSILLECTOMY AND ADENOIDECTOMY      Allergies  Allergen Reactions  . Hydrocodone Other (See Comments)    GI upset    Outpatient Encounter Medications as of 07/06/2020  Medication Sig  . acetaminophen (TYLENOL) 325 MG tablet Take 650 mg by mouth every 6 (six) hours as needed for mild pain or moderate pain.  Marland Kitchen albuterol (PROVENTIL HFA;VENTOLIN HFA) 108 (90 Base) MCG/ACT inhaler Inhale 2 puffs into the lungs every 6 (six) hours as needed for wheezing or shortness of breath.  Marland Kitchen aspirin EC 81 MG tablet Take 81 mg by mouth daily.  . bisacodyl (BISACODYL LAXATIVE) 10 MG suppository Place 10 mg rectally as needed for moderate constipation.  . busPIRone (BUSPAR) 15 MG tablet Take 15 mg by mouth 3 (three) times daily.  . carbidopa-levodopa (SINEMET IR) 25-100 MG tablet Take 1 tablet by mouth 3 (three) times daily.  . cholecalciferol (VITAMIN D) 1000 units tablet Take 1,000 Units  by mouth daily.   Marland Kitchen donepezil (ARICEPT) 10 MG tablet Take 10 mg by mouth at bedtime.   . DULoxetine (CYMBALTA) 30 MG capsule Give 3 tablets (90mg ) by mouth daily  . fluticasone (FLONASE) 50 MCG/ACT nasal spray Place 2 sprays into both nostrils daily.   . Lidocaine (SALONPAS PAIN RELIEVING) 4 % PTCH APPLY 2 PATCHES TOPICALLY TO LOWER BACK ONCE DAILY FOR PAIN (REMOVE AT BEDTIME)  . lithium carbonate 300 MG capsule Take 300 mg by mouth at bedtime.  LORazepam (ATIVAN) 1 MG tablet Take 1 tablet  (1 mg total) by mouth every 12 (twelve) hours.  . magnesium hydroxide (MILK OF MAGNESIA) 400 MG/5ML suspension If no BM in 3 days, give 30 cc Milk of Magnesium p.o. x 1 dose in 24 hours as needed (Do not use standing constipation orders for residents with renal failure CFR less than 30. Contact MD for orders) (Physician Order)  . memantine (NAMENDA) 10 MG tablet Take 10 mg by mouth 2 (two) times daily.   . midodrine (PROAMATINE) 2.5 MG tablet Take 1 tablet (2.5 mg total) by mouth 2 (two) times daily as needed. For SBP <=95  . NON FORMULARY Regular /thin diet consistency  . Nutritional Supplement LIQD Take 120 mLs by mouth 3 (three) times daily. MedPass   . Nutritional Supplements (NUTRITIONAL SUPPLEMENT PO) Take 1 each by mouth daily. Magic Cup  . pantoprazole (PROTONIX) 40 MG tablet Take 40 mg by mouth daily.   . polyethylene glycol (MIRALAX / GLYCOLAX) packet Take 17 g by mouth daily as needed.   . sodium fluoride (PREVIDENT 5000 PLUS) 1.1 % CREA dental cream Place 1 application onto teeth at bedtime.   . Sodium Phosphates (RA SALINE ENEMA RE) If not relieved by Biscodyl suppository, give disposable Saline Enema rectally X 1 dose/24 hrs as needed (Do not use constipation standing orders for residents with renal failure/CFR less than 30. Contact MD for orders)(Physician Or  . ziprasidone (GEODON) 80 MG capsule Take 80 mg by mouth 2 (two) times daily.    No facility-administered encounter medications on file as of 07/06/2020.    Review of Systems  GENERAL: No change in appetite, no fatigue, no weight changes, no fever, chills or weakness MOUTH and THROAT: Denies oral discomfort, gingival pain or bleeding RESPIRATORY: no cough, SOB, DOE, wheezing, hemoptysis CARDIAC: No chest pain, edema or palpitations GI: No abdominal pain, diarrhea, constipation, heart burn, nausea or vomiting NEUROLOGICAL: Denies dizziness, syncope, numbness, or headache PSYCHIATRIC: Denies feelings of depression or  anxiety. No report of hallucinations, insomnia, paranoia, or agitation   Immunization History  Administered Date(s) Administered  . Influenza-Unspecified 03/17/2017, 09/09/2017, 08/17/2018, 08/29/2019  . Moderna SARS-COVID-2 Vaccination 12/05/2019, 01/02/2020  . PPD Test 03/24/2017, 06/02/2018, 06/08/2018  . Pneumococcal Polysaccharide-23 03/17/2017, 06/12/2018   Pertinent  Health Maintenance Due  Topic Date Due  . DEXA SCAN  Never done  . PNA vac Low Risk Adult (2 of 2 - PCV13) 06/13/2019  . INFLUENZA VACCINE  06/17/2020   Fall Risk  05/27/2018 05/14/2017  Falls in the past year? No Yes  Number falls in past yr: - 2 or more  Injury with Fall? - No     Vitals:   07/06/20 1050  BP: 114/74  Pulse: 79  Resp: 16  Temp: (!) 97 F (36.1 C)  TempSrc: Oral  Weight: 127 lb 9.6 oz (57.9 kg)  Height: 5\' 5"  (1.651 m)   Body mass index is 21.23 kg/m.  Physical Exam  GENERAL APPEARANCE:  Well nourished. In no acute distress. Normal body habitus SKIN:  Skin is warm and dry.  MOUTH and THROAT: Lips are without lesions. Oral mucosa is moist and without lesions. Tongue is normal in shape, size, and color and without lesions RESPIRATORY: Breathing is even & unlabored, BS CTAB CARDIAC: RRR, no murmur,no extra heart sounds, no edema GI: Abdomen soft, normal BS, no masses, no tenderness EXTREMITIES:  Able to move X4 extremities NEUROLOGICAL: +tremor. Speech is clear. Alert to self, disoriented to time and place. PSYCHIATRIC:  Affect and behavior are appropriate  Labs reviewed: Recent Labs    12/22/19 0000 12/26/19 0000 04/03/20 0000  NA 142 144 143  K 3.5 3.9 3.6  CL 106 106 105  CO2 29* 27* 26*  BUN 15 15 15   CREATININE 0.6 0.5 0.5  CALCIUM 9.3 8.7 9.6   Recent Labs    12/22/19 0000 12/26/19 0000  AST 13 11*  ALT 7 5*  ALKPHOS 65 60  ALBUMIN 3.8 3.5   Recent Labs    12/22/19 0000 12/26/19 0000 04/03/20 0000  WBC 5.1 5.7 5.5  NEUTROABS  --   --  3  HGB 12.6 11.8*  12.2  HCT 37 33* 34*  PLT 256 267 262   Lab Results  Component Value Date   TSH 4.70 04/30/2019   Lab Results  Component Value Date   HGBA1C 5.4 03/15/2018   Lab Results  Component Value Date   CHOL 139 03/15/2018   HDL 46 03/15/2018   LDLCALC 74 03/15/2018   TRIG 97 03/15/2018    Assessment/Plan  1. Weight loss Wt Readings from Last 3 Encounters:  07/06/20 127 lb 9.6 oz (57.9 kg)  06/14/20 133 lb 12.8 oz (60.7 kg)  06/12/20 133 lb 12.8 oz (60.7 kg)   Body mass index is 21.23 kg/m. -Continue NAS med pass in Magic cup -Followed by palliative care  2. Other drug-induced secondary parkinsonism (HCC) -Stable, continue carbidopa-levodopa -Follows up with neurology  3. Mild intermittent asthma with allergic rhinitis without complication -No wheezing/SOB, continue PRN Albuterol HFA and Fluticasone nasal spray  4. Severe episode of recurrent major depressive disorder, with psychotic features (HCC) -  Mood is stable, continue Cymbalta and ziprasidone -Followed by psych NP  5. Schizoaffective disorder, depressive type (HCC) -Stable, continue buspirone, Ativan and lithium  6. Vascular dementia without behavioral disturbance (HCC) -Stable, continue memantine    Family/ staff Communication: Discussed plan of care with resident and charge nurse.  Labs/tests ordered: None  Goals of care:   Long-term care/palliative care   06/14/20, DNP, MSN, FNP-BC [facility] [phone] (Monday-Friday 8:00 a.m. - 5:00 p.m.) [phone] (after hours)

## 2020-07-10 ENCOUNTER — Other Ambulatory Visit: Payer: Self-pay | Admitting: Internal Medicine

## 2020-07-10 MED ORDER — LORAZEPAM 1 MG PO TABS: 1.0000 mg | ORAL_TABLET | Freq: Two times a day (BID) | ORAL | 0 refills | Status: DC

## 2020-07-31 ENCOUNTER — Other Ambulatory Visit: Payer: Self-pay

## 2020-07-31 ENCOUNTER — Other Ambulatory Visit: Payer: Self-pay | Admitting: Adult Health

## 2020-07-31 ENCOUNTER — Non-Acute Institutional Stay: Payer: Medicare Other | Admitting: Hospice

## 2020-07-31 DIAGNOSIS — Z515 Encounter for palliative care: Secondary | ICD-10-CM

## 2020-07-31 DIAGNOSIS — F0391 Unspecified dementia with behavioral disturbance: Secondary | ICD-10-CM

## 2020-07-31 MED ORDER — LORAZEPAM 1 MG PO TABS: 1.0000 mg | ORAL_TABLET | Freq: Two times a day (BID) | ORAL | 0 refills | Status: DC

## 2020-07-31 NOTE — Progress Notes (Signed)
Therapist, nutritional Palliative Care Consult Note Telephone: [phone]  Fax: [phone]  PATIENT NAME: Jennifer Garner DOB: [birth year] MRN: [id number]  PRIMARY CARE PROVIDER:   Pecola Lawless, MD Jennifer Lawless, MD [address] [place],  [place] [address]  REFERRING PROVIDER: Pecola Lawless, MD Jennifer Lawless, MD [address] [place],  [place] [address]  RESPONSIBLE PARTY:  Jennifer Garner (Daughter)  Phone Number [phone]    RECOMMENDATIONS/PLAN:   Advance Care Planning: Follow up visit consisted of building trust and discussions on Palliative Medicine as specialized medical care for people living with serious illness, aimed at facilitating better quality of life through symptoms relief, assisting with advance care plan and establishing goals of care.   Code Status: Patient is a Full code.   Goals of Care: Goals of care include to maximize quality of life and symptom management. Called daughter Jennifer Garner on visit and left her a voicemail with call back #.  Identified need to further explore goals of care, in the backdrop of patient's advanced Dementia and  being a Full code and desiring full scope of treatment - chest compressions, advanced airway intervention/  Mechanical intervention.  NP discussed patient's CODE STATUS with facility social worker who explained that patient's family has insisted patient remains a full code.  Follow up: Palliative care will continue to follow patient for goals of care clarification and symptom management.  Follow-up in 1 to 2 months.  Cognitive/Functional status/Symptom management: Memoroy loss/confusion related to Dementia. Patient is bed bound, total care incontinent of bowel and bladder, FLACC 0 FAST 7b. Followed by Psych.  Resting tremors related to Parkinson disease.  On magic cup to augment nutrition.  Patient in no acute distress; nursing with no concerns.  Patient is compliant with her  medications.  Encouraged ongoing care. Palliative will continue to monitor for symptom management/decline and make recommendations as needed.  Family /Caregiver/Community Supports:  I spent 1 hour and 20 minutes providing this consultation; time includes time spent with patient/family, chart review, provider coordination,  and documentation. More than 50% of the time in this consultation was spent on coordinating communication  HISTORY OF PRESENT ILLNESS:  Jennifer Garner is a 74 y.o. year old female with multiple medical problems including Dementia, Parkinson disease, hx of stroke Dysphagia. Palliative Care was asked to help address goals of care.   CODE STATUS: Full  PPS: 30% HOSPICE ELIGIBILITY/DIAGNOSIS: TBD  PAST MEDICAL HISTORY:  Past Medical History:  Diagnosis Date  . Allergy   . Anxiety   . Arthritis   . Asthma   . Benign essential hypertension 03/18/2017  . Cataract   . Dementia (HCC)   . Dementia with Lewy bodies (CODE) (HCC)   . Depression   . Difficulty walking   . Dysphagia, oral phase   . Fibromyalgia   . GERD (gastroesophageal reflux disease)   . Hallucinations   . Hyperlipidemia   . Lower extremity edema   . Repeated falls   . Rhabdomyolysis   . Rhabdomyolysis   . Stroke (HCC)   . TIA (transient ischemic attack)   . Urinary incontinence     SOCIAL HX:  Social History   Tobacco Use  . Smoking status: Never Smoker  . Smokeless tobacco: Never Used  Substance Use Topics  . Alcohol use: No    ALLERGIES:  Allergies  Allergen Reactions  . Hydrocodone Other (See Comments)    GI upset     PERTINENT MEDICATIONS:  Outpatient Encounter Medications as of 07/31/2020  Medication Sig  . acetaminophen (TYLENOL) 325 MG tablet Take 650 mg by mouth every 6 (six) hours as needed for mild pain or moderate pain.  Marland Kitchen albuterol (PROVENTIL HFA;VENTOLIN HFA) 108 (90 Base) MCG/ACT inhaler Inhale 2 puffs into the lungs every 6 (six) hours as needed for wheezing or  shortness of breath.  Marland Kitchen aspirin EC 81 MG tablet Take 81 mg by mouth daily.  . bisacodyl (BISACODYL LAXATIVE) 10 MG suppository Place 10 mg rectally as needed for moderate constipation.  . busPIRone (BUSPAR) 15 MG tablet Take 15 mg by mouth 3 (three) times daily.  . carbidopa-levodopa (SINEMET IR) 25-100 MG tablet Take 1 tablet by mouth 3 (three) times daily.  . cholecalciferol (VITAMIN D) 1000 units tablet Take 1,000 Units by mouth daily.   Marland Kitchen donepezil (ARICEPT) 10 MG tablet Take 10 mg by mouth at bedtime.   . DULoxetine (CYMBALTA) 30 MG capsule Give 3 tablets (90mg ) by mouth daily  . fluticasone (FLONASE) 50 MCG/ACT nasal spray Place 2 sprays into both nostrils daily.   . Lidocaine (SALONPAS PAIN RELIEVING) 4 % PTCH APPLY 2 PATCHES TOPICALLY TO LOWER BACK ONCE DAILY FOR PAIN (REMOVE AT BEDTIME)  . lithium carbonate 300 MG capsule Take 300 mg by mouth at bedtime.  LORazepam (ATIVAN) 1 MG tablet Take 1 tablet (1 mg total) by mouth every 12 (twelve) hours.  . magnesium hydroxide (MILK OF MAGNESIA) 400 MG/5ML suspension If no BM in 3 days, give 30 cc Milk of Magnesium p.o. x 1 dose in 24 hours as needed (Do not use standing constipation orders for residents with renal failure CFR less than 30. Contact MD for orders) (Physician Order)  . memantine (NAMENDA) 10 MG tablet Take 10 mg by mouth 2 (two) times daily.   . midodrine (PROAMATINE) 2.5 MG tablet Take 1 tablet (2.5 mg total) by mouth 2 (two) times daily as needed. For SBP <=95  . NON FORMULARY Regular /thin diet consistency  . Nutritional Supplement LIQD Take 120 mLs by mouth 3 (three) times daily. MedPass   . Nutritional Supplements (NUTRITIONAL SUPPLEMENT PO) Take 1 each by mouth daily. Magic Cup  . pantoprazole (PROTONIX) 40 MG tablet Take 40 mg by mouth daily.   . polyethylene glycol (MIRALAX / GLYCOLAX) packet Take 17 g by mouth daily as needed.   . sodium fluoride (PREVIDENT 5000 PLUS) 1.1 % CREA dental cream Place 1 application onto  teeth at bedtime.   . Sodium Phosphates (RA SALINE ENEMA RE) If not relieved by Biscodyl suppository, give disposable Saline Enema rectally X 1 dose/24 hrs as needed (Do not use constipation standing orders for residents with renal failure/CFR less than 30. Contact MD for orders)(Physician Or  . ziprasidone (GEODON) 80 MG capsule Take 80 mg by mouth 2 (two) times daily.    No facility-administered encounter medications on file as of 07/31/2020.    PHYSICAL EXAM/ROS Current weight  127.6 Height 5 feet and 5 inches General: NAD, cooperative Cardiovascular: regular rate and rhythm; denies chest pain Pulmonary: clear ant fields; no coughing, no SOB, normal respiratory effort Abdomen: soft, nontender, + bowel sounds GU: no suprapubic tenderness Extremities: no edema Skin: no rashes to exposed skin Neurological: Weakness but otherwise nonfocal; forgetful, tremors  08/02/2020, NP

## 2020-08-08 DIAGNOSIS — F419 Anxiety disorder, unspecified: Secondary | ICD-10-CM | POA: Diagnosis not present

## 2020-08-08 DIAGNOSIS — G3183 Dementia with Lewy bodies: Secondary | ICD-10-CM | POA: Diagnosis not present

## 2020-08-08 DIAGNOSIS — F251 Schizoaffective disorder, depressive type: Secondary | ICD-10-CM | POA: Diagnosis not present

## 2020-08-08 DIAGNOSIS — F329 Major depressive disorder, single episode, unspecified: Secondary | ICD-10-CM | POA: Diagnosis not present

## 2020-08-10 ENCOUNTER — Non-Acute Institutional Stay (SKILLED_NURSING_FACILITY): Payer: Medicare Other | Admitting: Adult Health

## 2020-08-10 ENCOUNTER — Encounter: Payer: Self-pay | Admitting: Adult Health

## 2020-08-10 DIAGNOSIS — G2119 Other drug induced secondary parkinsonism: Secondary | ICD-10-CM

## 2020-08-10 DIAGNOSIS — F251 Schizoaffective disorder, depressive type: Secondary | ICD-10-CM

## 2020-08-10 DIAGNOSIS — J4520 Mild intermittent asthma, uncomplicated: Secondary | ICD-10-CM | POA: Diagnosis not present

## 2020-08-10 DIAGNOSIS — F0150 Vascular dementia without behavioral disturbance: Secondary | ICD-10-CM | POA: Diagnosis not present

## 2020-08-10 DIAGNOSIS — F333 Major depressive disorder, recurrent, severe with psychotic symptoms: Secondary | ICD-10-CM | POA: Diagnosis not present

## 2020-08-10 NOTE — Progress Notes (Signed)
Location:  [facility] Room Number: 225 B Place of Service:  SNF (31) Provider:  Kenard Gower, DNP, FNP-BC  Patient Care Team: Pecola Lawless, MD as PCP - General (Internal Medicine) Center, [facility] (Skilled Nursing Facility) Synetta Shadow as Physician Assistant (Internal Medicine)  Extended Emergency Contact Information Primary Emergency Contact: [name] of [place] Home Phone: [phone] Relation: Daughter Secondary Emergency Contact: [name] [place] of [place] Home Phone: [phone] Relation: Daughter  Code Status:  FULL CODE  Goals of care: Advanced Directive information Advanced Directives 06/05/2020  Does Patient Have a Medical Advance Directive? Unable to assess, patient is non-responsive or altered mental status  Type of Advance Directive -  Does patient want to make changes to medical advance directive? -  Would patient like information on creating a medical advance directive? -  Pre-existing out of facility DNR order (yellow form or pink MOST form) -     Chief Complaint  Patient presents with  . Medical Management of Chronic Issues    Routine Visit    HPI:  Pt is a 74 y.o. female seen today for medical management of chronic diseases.  She is a long-term care resident of [facility].  She has a PMH of TIA, stroke, Parkinson's disease with hallucinations, dyslipidemia and history of asthma.  She was seen in her room today.  No reported SOB nor wheezing. She takes Flonase 50 mcg 2 sprays to each nostril daily and PRN albuterol for asthma with allergic rhinitis.  Daughter reported hallucinations.  She takes ziprasidone 80 mg twice a day, lithium carbonate 300 mg 1 capsule at bedtime  and Ativan 1 mg twice a day for schizoaffective disorder.   Past Medical History:  Diagnosis Date  . Allergy   . Anxiety   . Arthritis   . Asthma   . Benign essential  hypertension 03/18/2017  . Cataract   . Dementia (HCC)   . Dementia with Lewy bodies (CODE) (HCC)   . Depression   . Difficulty walking   . Dysphagia, oral phase   . Fibromyalgia   . GERD (gastroesophageal reflux disease)   . Hallucinations   . Hyperlipidemia   . Lower extremity edema   . Repeated falls   . Rhabdomyolysis   . Rhabdomyolysis   . Stroke (HCC)   . TIA (transient ischemic attack)   . Urinary incontinence    Past Surgical History:  Procedure Laterality Date  . ABDOMINAL HYSTERECTOMY    . CATARACT EXTRACTION    . CHOLECYSTECTOMY    . TONSILLECTOMY AND ADENOIDECTOMY      Allergies  Allergen Reactions  . Hydrocodone Other (See Comments)    GI upset    Outpatient Encounter Medications as of 08/10/2020  Medication Sig  . acetaminophen (TYLENOL) 325 MG tablet Take 650 mg by mouth every 6 (six) hours as needed for mild pain or moderate pain.  Marland Kitchen albuterol (PROVENTIL HFA;VENTOLIN HFA) 108 (90 Base) MCG/ACT inhaler Inhale 2 puffs into the lungs every 6 (six) hours as needed for wheezing or shortness of breath.  Marland Kitchen aspirin EC 81 MG tablet Take 81 mg by mouth daily.  . bisacodyl (BISACODYL LAXATIVE) 10 MG suppository Place 10 mg rectally as needed for moderate constipation.  . busPIRone (BUSPAR) 15 MG tablet Take 15 mg by mouth 3 (three) times daily.  . carbidopa-levodopa (SINEMET IR) 25-100 MG tablet Take 1 tablet by mouth 3 (three) times daily.  . cholecalciferol (VITAMIN  D) 1000 units tablet Take 1,000 Units by mouth daily.   Marland Kitchen donepezil (ARICEPT) 10 MG tablet Take 10 mg by mouth at bedtime.   . DULoxetine (CYMBALTA) 30 MG capsule Give 3 tablets (90mg ) by mouth daily  . fluticasone (FLONASE) 50 MCG/ACT nasal spray Place 2 sprays into both nostrils daily.   . Lidocaine (SALONPAS PAIN RELIEVING) 4 % PTCH APPLY 2 PATCHES TOPICALLY TO LOWER BACK ONCE DAILY FOR PAIN (REMOVE AT BEDTIME)  . lithium carbonate 300 MG capsule Take 300 mg by mouth at bedtime.  LORazepam (ATIVAN)  1 MG tablet Take 1 tablet (1 mg total) by mouth every 12 (twelve) hours.  . magnesium hydroxide (MILK OF MAGNESIA) 400 MG/5ML suspension If no BM in 3 days, give 30 cc Milk of Magnesium p.o. x 1 dose in 24 hours as needed (Do not use standing constipation orders for residents with renal failure CFR less than 30. Contact MD for orders) (Physician Order)  . memantine (NAMENDA) 10 MG tablet Take 10 mg by mouth 2 (two) times daily.   . midodrine (PROAMATINE) 2.5 MG tablet Take 1 tablet (2.5 mg total) by mouth 2 (two) times daily as needed. For SBP <=95  . NON FORMULARY Regular /thin diet consistency  . Nutritional Supplement LIQD Take 120 mLs by mouth 3 (three) times daily. MedPass   . Nutritional Supplements (NUTRITIONAL SUPPLEMENT PO) Take 1 each by mouth daily. Magic Cup  . pantoprazole (PROTONIX) 40 MG tablet Take 40 mg by mouth daily.   . polyethylene glycol (MIRALAX / GLYCOLAX) packet Take 17 g by mouth daily as needed.   . sodium fluoride (PREVIDENT 5000 PLUS) 1.1 % CREA dental cream Place 1 application onto teeth at bedtime.   . Sodium Phosphates (RA SALINE ENEMA RE) If not relieved by Biscodyl suppository, give disposable Saline Enema rectally X 1 dose/24 hrs as needed (Do not use constipation standing orders for residents with renal failure/CFR less than 30. Contact MD for orders)(Physician Or  . ziprasidone (GEODON) 80 MG capsule Take 80 mg by mouth 2 (two) times daily.    No facility-administered encounter medications on file as of 08/10/2020.    Review of Systems  GENERAL: No fever or chills MOUTH and THROAT: Denies oral discomfort, gingival pain or bleeding RESPIRATORY: no cough, SOB, DOE, wheezing, hemoptysis CARDIAC: No chest pain, edema or palpitations GI: No abdominal pain, diarrhea, constipation, heart burn, nausea or vomiting GU: Denies dysuria, frequency, hematuria or discharge NEUROLOGICAL: Denies dizziness, syncope, numbness, or headache PSYCHIATRIC: Denies feelings of  depression or anxiety. No report of hallucinations, insomnia, paranoia, or agitation   Immunization History  Administered Date(s) Administered  . Influenza-Unspecified 03/17/2017, 09/09/2017, 08/17/2018, 08/29/2019  . Moderna SARS-COVID-2 Vaccination 12/05/2019, 01/02/2020  . PPD Test 03/24/2017, 06/02/2018, 06/08/2018  . Pneumococcal Polysaccharide-23 03/17/2017, 06/12/2018   Pertinent  Health Maintenance Due  Topic Date Due  . DEXA SCAN  Never done  . PNA vac Low Risk Adult (2 of 2 - PCV13) 06/13/2019  . INFLUENZA VACCINE  06/17/2020   Fall Risk  05/27/2018 05/14/2017  Falls in the past year? No Yes  Number falls in past yr: - 2 or more  Injury with Fall? - No     Vitals:   08/10/20 1000  BP: 137/71  Pulse: 73  Resp: 19  Temp: (!) 97.3 F (36.3 C)  Weight: 128 lb 6.4 oz (58.2 kg)  Height: 5\' 5"  (1.651 m)   Body mass index is 21.37 kg/m.  Physical Exam  GENERAL  APPEARANCE: Well nourished. In no acute distress. Normal body habitus SKIN:  Skin is warm and dry.  MOUTH and THROAT: Lips are without lesions. Oral mucosa is moist and without lesions. Tongue is normal in shape, size, and color and without lesions RESPIRATORY: Breathing is even & unlabored, BS CTAB CARDIAC: RRR, no murmur,no extra heart sounds, no edema GI: Abdomen soft, normal BS, no masses, no tenderness NEUROLOGICAL: +tremor. Speech is clear. Alert to self, disoriented to time and place. PSYCHIATRIC: Affect and behavior are appropriate  Labs reviewed: Recent Labs    12/22/19 0000 12/26/19 0000 04/03/20 0000  NA 142 144 143  K 3.5 3.9 3.6  CL 106 106 105  CO2 29* 27* 26*  BUN 15 15 15   CREATININE 0.6 0.5 0.5  CALCIUM 9.3 8.7 9.6   Recent Labs    12/22/19 0000 12/26/19 0000  AST 13 11*  ALT 7 5*  ALKPHOS 65 60  ALBUMIN 3.8 3.5   Recent Labs    12/22/19 0000 12/26/19 0000 04/03/20 0000  WBC 5.1 5.7 5.5  NEUTROABS  --   --  3  HGB 12.6 11.8* 12.2  HCT 37 33* 34*  PLT 256 267 262    Lab Results  Component Value Date   TSH 4.70 04/30/2019   Lab Results  Component Value Date   HGBA1C 5.4 03/15/2018   Lab Results  Component Value Date   CHOL 139 03/15/2018   HDL 46 03/15/2018   LDLCALC 74 03/15/2018   TRIG 97 03/15/2018    Assessment/Plan  1. Mild intermittent asthma with allergic rhinitis without complication -No wheezing/SOB, continue Flonase and PRN albuterol  2. Other drug-induced secondary parkinsonism (HCC) - +tremors, continue carbidopa-levodopa -Follows up with neurology  3. Schizoaffective disorder, depressive type (HCC) -Denies hallucinations, continue lithium carbonate, ziprasidone and Ativan  4. Severe episode of recurrent major depressive disorder, with psychotic features (HCC) -Mood is a stable, continue buspirone and duloxetine -Followed up by psych NP  5. Vascular dementia without behavioral disturbance (HCC) -Continue memantine and donepezil -Continue supportive care     Family/ staff Communication: Discussed plan of care with resident and charge nurse.  Labs/tests ordered: None  Goals of care:   Long-term care  03/17/2018, DNP, MSN, FNP-BC [facility] [phone] (Monday-Friday 8:00 a.m. - 5:00 p.m.) [phone] (after hours)

## 2020-08-27 ENCOUNTER — Encounter: Payer: Self-pay | Admitting: Adult Health

## 2020-08-27 ENCOUNTER — Non-Acute Institutional Stay (SKILLED_NURSING_FACILITY): Payer: Medicare Other | Admitting: Adult Health

## 2020-08-27 DIAGNOSIS — G2119 Other drug induced secondary parkinsonism: Secondary | ICD-10-CM | POA: Diagnosis not present

## 2020-08-27 DIAGNOSIS — F333 Major depressive disorder, recurrent, severe with psychotic symptoms: Secondary | ICD-10-CM

## 2020-08-27 DIAGNOSIS — M79661 Pain in right lower leg: Secondary | ICD-10-CM

## 2020-08-27 DIAGNOSIS — M79662 Pain in left lower leg: Secondary | ICD-10-CM

## 2020-08-27 NOTE — Progress Notes (Signed)
Location:  [facility] Room Number: 225-B Place of Service:  SNF (31) Provider:  Kenard Gower, DNP, FNP-BC  Patient Care Team: Pecola Lawless, MD as PCP - General (Internal Medicine) Center, [facility]) Synetta Shadow as Physician Assistant (Internal Medicine)  Extended Emergency Contact Information Primary Emergency Contact: [name] of [place] Home Phone: [phone] Relation: Daughter Secondary Emergency Contact: [name] [place] of [place] Home Phone: [phone] Relation: Daughter  Code Status:  FULL CODE  Goals of care: Advanced Directive information Advanced Directives 08/27/2020  Does Patient Have a Medical Advance Directive? Yes  Type of Advance Directive Out of facility DNR (pink MOST or yellow form)  Does patient want to make changes to medical advance directive? No - Patient declined  Would patient like information on creating a medical advance directive? -  Pre-existing out of facility DNR order (yellow form or pink MOST form) Pink MOST form placed in chart (order not valid for inpatient use)     Chief Complaint  Patient presents with  . Acute Visit    Patient is seen for pain management    HPI:  Pt is a 74 y.o. female seen today for pain management. She is a long-term care resident of [facility]. She has a PMH of TIA, stroke, Parkinson's disease with hallucinations, dyslipidemia and history of asthma. She complains of pain on bilateral legs, 5-6/10 pain. Talked to daughter, Hali Marry, over the phone and discussed pain. She stated that she does not want any narcotic to be given to her. Daughter, Hali Marry, stated that she had history of having fusion on her back. Resident prefers to stay in bed. No edema nor redness on her legs.   Past Medical History:  Diagnosis Date  . Allergy   . Anxiety   . Arthritis   . Asthma   . Benign essential  hypertension 03/18/2017  . Cataract   . Dementia (HCC)   . Dementia with Lewy bodies (CODE) (HCC)   . Depression   . Difficulty walking   . Dysphagia, oral phase   . Fibromyalgia   . GERD (gastroesophageal reflux disease)   . Hallucinations   . Hyperlipidemia   . Lower extremity edema   . Repeated falls   . Rhabdomyolysis   . Rhabdomyolysis   . Stroke (HCC)   . TIA (transient ischemic attack)   . Urinary incontinence    Past Surgical History:  Procedure Laterality Date  . ABDOMINAL HYSTERECTOMY    . CATARACT EXTRACTION    . CHOLECYSTECTOMY    . TONSILLECTOMY AND ADENOIDECTOMY      Allergies  Allergen Reactions  . Hydrocodone Other (See Comments)    GI upset    Outpatient Encounter Medications as of 08/27/2020  Medication Sig  . acetaminophen (TYLENOL) 325 MG tablet Take 650 mg by mouth See admin instructions. 650 BID PRN and 650 BID scheduled  . albuterol (PROVENTIL HFA;VENTOLIN HFA) 108 (90 Base) MCG/ACT inhaler Inhale 2 puffs into the lungs every 6 (six) hours as needed for wheezing or shortness of breath.  Marland Kitchen aspirin EC 81 MG tablet Take 81 mg by mouth daily.  . bisacodyl (BISACODYL LAXATIVE) 10 MG suppository Place 10 mg rectally as needed for moderate constipation.  . busPIRone (BUSPAR) 15 MG tablet Take 15 mg by mouth 3 (three) times daily.  . carbidopa-levodopa (SINEMET IR) 25-100 MG tablet Take 1 tablet by mouth 3 (three) times daily.  . cholecalciferol (VITAMIN  D) 1000 units tablet Take 1,000 Units by mouth daily.   Marland Kitchen donepezil (ARICEPT) 10 MG tablet Take 10 mg by mouth at bedtime.   . DULoxetine (CYMBALTA) 30 MG capsule Give 3 tablets (90mg ) by mouth daily  . fluticasone (FLONASE) 50 MCG/ACT nasal spray Place 2 sprays into both nostrils daily.   . Lidocaine (SALONPAS PAIN RELIEVING) 4 % PTCH APPLY 2 PATCHES TOPICALLY TO LOWER BACK ONCE DAILY FOR PAIN (REMOVE AT BEDTIME)  . lithium carbonate 300 MG capsule Take 300 mg by mouth at bedtime.  LORazepam (ATIVAN) 1  MG tablet Take 1 tablet (1 mg total) by mouth every 12 (twelve) hours.  . magnesium hydroxide (MILK OF MAGNESIA) 400 MG/5ML suspension If no BM in 3 days, give 30 cc Milk of Magnesium p.o. x 1 dose in 24 hours as needed (Do not use standing constipation orders for residents with renal failure CFR less than 30. Contact MD for orders) (Physician Order)  . memantine (NAMENDA) 10 MG tablet Take 10 mg by mouth 2 (two) times daily.   . Menthol, Topical Analgesic, (BIOFREEZE) 4 % GEL Apply 1 application topically in the morning, at noon, in the evening, and at bedtime. Apply to bilateral lower legs for pain  . midodrine (PROAMATINE) 2.5 MG tablet Take 1 tablet (2.5 mg total) by mouth 2 (two) times daily as needed. For SBP <=95  . NON FORMULARY Regular /thin diet consistency  . Nutritional Supplement LIQD Take 120 mLs by mouth 3 (three) times daily. MedPass   . Nutritional Supplements (NUTRITIONAL SUPPLEMENT PO) Take 1 each by mouth in the morning and at bedtime. Magic Cup   . pantoprazole (PROTONIX) 40 MG tablet Take 40 mg by mouth daily.   . polyethylene glycol (MIRALAX / GLYCOLAX) packet Take 17 g by mouth daily as needed.   . sodium fluoride (PREVIDENT 5000 PLUS) 1.1 % CREA dental cream Place 1 application onto teeth at bedtime.   . Sodium Phosphates (RA SALINE ENEMA RE) If not relieved by Biscodyl suppository, give disposable Saline Enema rectally X 1 dose/24 hrs as needed (Do not use constipation standing orders for residents with renal failure/CFR less than 30. Contact MD for orders)(Physician Or  . ziprasidone (GEODON) 80 MG capsule Take 80 mg by mouth 2 (two) times daily.    No facility-administered encounter medications on file as of 08/27/2020.    Review of Systems  GENERAL: No change in appetite, no fatigue, no weight changes, no fever, chills or weakness MOUTH and THROAT: Denies oral discomfort, gingival pain RESPIRATORY: no cough, SOB, DOE, wheezing, hemoptysis CARDIAC: No chest pain,  edema or palpitations GI: No abdominal pain, diarrhea, constipation, heart burn, nausea or vomiting GU: Denies dysuria, frequency, hematuria or discharge NEUROLOGICAL: Denies dizziness, syncope, numbness, or headache PSYCHIATRIC:  No report of hallucinations, insomnia, paranoia, or agitation   Immunization History  Administered Date(s) Administered  . Influenza-Unspecified 03/17/2017, 09/09/2017, 08/17/2018, 08/29/2019  . Moderna SARS-COVID-2 Vaccination 12/05/2019, 01/02/2020  . PPD Test 03/24/2017, 06/02/2018, 06/08/2018  . Pneumococcal Polysaccharide-23 03/17/2017, 06/12/2018   Pertinent  Health Maintenance Due  Topic Date Due  . DEXA SCAN  Never done  . PNA vac Low Risk Adult (2 of 2 - PCV13) 06/13/2019  . INFLUENZA VACCINE  02/14/2021 (Originally 06/17/2020)   Fall Risk  05/27/2018 05/14/2017  Falls in the past year? No Yes  Number falls in past yr: - 2 or more  Injury with Fall? - No     Vitals:   08/27/20 1607  BP: 96/67  Pulse: 65  Resp: 18  Temp: (!) 97.2 F (36.2 C)  TempSrc: Oral  Weight: 128 lb 6.4 oz (58.2 kg)  Height: 5\' 5"  (1.651 m)   Body mass index is 21.37 kg/m.  Physical Exam  GENERAL APPEARANCE: Well nourished. In no acute distress. Normal body habitus SKIN:  Skin is warm and dry.  MOUTH and THROAT: Lips are without lesions. Oral mucosa is moist and without lesions. Tongue is normal in shape, size, and color and without lesions RESPIRATORY: Breathing is even & unlabored, BS CTAB CARDIAC: RRR, no murmur,no extra heart sounds, no edema GI: Abdomen soft, normal BS, no masses, no tenderness NEUROLOGICAL: +tremor. Speech is clear. Alert to self, disoriented to time and place PSYCHIATRIC:  Affect and behavior are appropriate  Labs reviewed: Recent Labs    12/22/19 0000 12/26/19 0000 04/03/20 0000  NA 142 144 143  K 3.5 3.9 3.6  CL 106 106 105  CO2 29* 27* 26*  BUN 15 15 15   CREATININE 0.6 0.5 0.5  CALCIUM 9.3 8.7 9.6   Recent Labs     12/22/19 0000 12/26/19 0000  AST 13 11*  ALT 7 5*  ALKPHOS 65 60  ALBUMIN 3.8 3.5   Recent Labs    12/22/19 0000 12/26/19 0000 04/03/20 0000  WBC 5.1 5.7 5.5  NEUTROABS  --   --  3  HGB 12.6 11.8* 12.2  HCT 37 33* 34*  PLT 256 267 262   Lab Results  Component Value Date   TSH 4.70 04/30/2019   Lab Results  Component Value Date   HGBA1C 5.4 03/15/2018   Lab Results  Component Value Date   CHOL 139 03/15/2018   HDL 46 03/15/2018   LDLCALC 74 03/15/2018   TRIG 97 03/15/2018    Assessment/Plan  1. Pain in both lower legs -  Will start Biofreeze 4% gel apply topically to bilateral lower legs -  Continue Acetaminophen 650 mg BID and BID PRN  2. Other drug-induced secondary parkinsonism (HCC) -Stable, continue carbidopa-levodopa 25-100 tab 3 times a day  3. Severe episode of recurrent major depressive disorder, with psychotic features (HCC) -Continue duloxetine 30 mg give 3 capsules= 90 mg daily and buspirone 15 mg 1 tab 3 times a day, Ativan 1 mg 1 tab every 12 hours, ziprasidone 80 mg 1 capsule twice a day        Family/ staff Communication: Discussed plan of care with charge nurse, resident and daughter.     Labs/tests ordered:  None   Goals of care:   Long-term care   03/17/2018, DNP, MSN, FNP-BC [facility] [phone] (Monday-Friday 8:00 a.m. - 5:00 p.m.) [phone] (after hours)

## 2020-08-28 ENCOUNTER — Encounter: Payer: Self-pay | Admitting: Adult Health

## 2020-08-28 DIAGNOSIS — Z23 Encounter for immunization: Secondary | ICD-10-CM | POA: Diagnosis not present

## 2020-09-03 ENCOUNTER — Other Ambulatory Visit: Payer: Self-pay | Admitting: Adult Health

## 2020-09-03 DIAGNOSIS — M25511 Pain in right shoulder: Secondary | ICD-10-CM | POA: Diagnosis not present

## 2020-09-03 MED ORDER — LORAZEPAM 1 MG PO TABS: 1.0000 mg | ORAL_TABLET | Freq: Two times a day (BID) | ORAL | 0 refills | Status: DC

## 2020-09-04 ENCOUNTER — Non-Acute Institutional Stay (SKILLED_NURSING_FACILITY): Payer: Medicare Other | Admitting: Adult Health

## 2020-09-04 ENCOUNTER — Encounter: Payer: Self-pay | Admitting: Adult Health

## 2020-09-04 DIAGNOSIS — J4520 Mild intermittent asthma, uncomplicated: Secondary | ICD-10-CM | POA: Diagnosis not present

## 2020-09-04 DIAGNOSIS — R6251 Failure to thrive (child): Secondary | ICD-10-CM

## 2020-09-04 DIAGNOSIS — F251 Schizoaffective disorder, depressive type: Secondary | ICD-10-CM

## 2020-09-04 DIAGNOSIS — G2119 Other drug induced secondary parkinsonism: Secondary | ICD-10-CM | POA: Diagnosis not present

## 2020-09-04 DIAGNOSIS — M25511 Pain in right shoulder: Secondary | ICD-10-CM | POA: Diagnosis not present

## 2020-09-04 DIAGNOSIS — F0150 Vascular dementia without behavioral disturbance: Secondary | ICD-10-CM | POA: Diagnosis not present

## 2020-09-04 DIAGNOSIS — R627 Adult failure to thrive: Secondary | ICD-10-CM

## 2020-09-04 NOTE — Progress Notes (Addendum)
Location:  [facility] Room Number: 225-B Place of Service:  SNF (31) Provider:  Kenard Gower, DNP, FNP-BC  Patient Care Team: Pecola Lawless, MD as PCP - General (Internal Medicine) Center, [facility] (Skilled Nursing Facility) Synetta Shadow as Physician Assistant (Internal Medicine)  Extended Emergency Contact Information Primary Emergency Contact: [name] of [place] Home Phone: [phone] Relation: Daughter Secondary Emergency Contact: [name] [place] of [place] Home Phone: [phone] Relation: Daughter  Code Status:  FULL CODE   Goals of care: Advanced Directive information Advanced Directives 08/27/2020  Does Patient Have a Medical Advance Directive? Yes  Type of Advance Directive Out of facility DNR (pink MOST or yellow form)  Does patient want to make changes to medical advance directive? No - Patient declined  Would patient like information on creating a medical advance directive? -  Pre-existing out of facility DNR order (yellow form or pink MOST form) Pink MOST form placed in chart (order not valid for inpatient use)     Chief Complaint  Patient presents with  . Medical Management of Chronic Issues    Routine [facility] visit  . Quality Metric Gaps    DEXA scan  . Immunizations    Prevnar-13    HPI:  Pt is a 74 y.o. female seen today for medical management of chronic disease. She is a long-term resident of [facility].  PMH of TIA, stroke, Parkinson's disease with hallucinations, dyslipidemia and history of asthma.  She is being followed by palliative care for failure to thrive. She prefers to stay in her room even with staff encouragement. No episode of wheezing nor SOB. She takes PRN Albuterol for intermittent asthma. Flu vaccine was administered on 08/28/20 with no adverse reactions.    Past Medical History:  Diagnosis Date  . Allergy   . Anxiety    . Arthritis   . Asthma   . Benign essential hypertension 03/18/2017  . Cataract   . Dementia (HCC)   . Dementia with Lewy bodies (CODE) (HCC)   . Depression   . Difficulty walking   . Dysphagia, oral phase   . Fibromyalgia   . GERD (gastroesophageal reflux disease)   . Hallucinations   . Hyperlipidemia   . Lower extremity edema   . Repeated falls   . Rhabdomyolysis   . Rhabdomyolysis   . Stroke (HCC)   . TIA (transient ischemic attack)   . Urinary incontinence    Past Surgical History:  Procedure Laterality Date  . ABDOMINAL HYSTERECTOMY    . CATARACT EXTRACTION    . CHOLECYSTECTOMY    . TONSILLECTOMY AND ADENOIDECTOMY      Allergies  Allergen Reactions  . Hydrocodone Other (See Comments)    GI upset    Outpatient Encounter Medications as of 09/04/2020  Medication Sig  . acetaminophen (TYLENOL) 325 MG tablet Take 650 mg by mouth See admin instructions. 650 BID PRN and 650 BID scheduled  . albuterol (PROVENTIL HFA;VENTOLIN HFA) 108 (90 Base) MCG/ACT inhaler Inhale 2 puffs into the lungs every 6 (six) hours as needed for wheezing or shortness of breath.  Marland Kitchen aspirin EC 81 MG tablet Take 81 mg by mouth daily.  . bisacodyl (BISACODYL LAXATIVE) 10 MG suppository Place 10 mg rectally as needed for moderate constipation.  . busPIRone (BUSPAR) 15 MG tablet Take 15 mg by mouth 3 (three) times daily.  . carbidopa-levodopa (SINEMET IR) 25-100 MG tablet Take 1 tablet by mouth 3 (  three) times daily.  . cholecalciferol (VITAMIN D) 1000 units tablet Take 1,000 Units by mouth daily.   Marland Kitchen donepezil (ARICEPT) 10 MG tablet Take 10 mg by mouth at bedtime.   . DULoxetine (CYMBALTA) 30 MG capsule Give 3 tablets (90mg ) by mouth daily  . fluticasone (FLONASE) 50 MCG/ACT nasal spray Place 2 sprays into both nostrils daily.   . Lidocaine (SALONPAS PAIN RELIEVING) 4 % PTCH APPLY 2 PATCHES TOPICALLY TO LOWER BACK ONCE DAILY FOR PAIN (REMOVE AT BEDTIME)  . lithium carbonate 300 MG capsule Take 300 mg  by mouth at bedtime.  LORazepam (ATIVAN) 1 MG tablet Take 1 tablet (1 mg total) by mouth every 12 (twelve) hours.  . magnesium hydroxide (MILK OF MAGNESIA) 400 MG/5ML suspension If no BM in 3 days, give 30 cc Milk of Magnesium p.o. x 1 dose in 24 hours as needed (Do not use standing constipation orders for residents with renal failure CFR less than 30. Contact MD for orders) (Physician Order)  . memantine (NAMENDA) 10 MG tablet Take 10 mg by mouth 2 (two) times daily.   . Menthol, Topical Analgesic, (BIOFREEZE) 4 % GEL Apply 1 application topically in the morning, at noon, in the evening, and at bedtime. Apply to bilateral lower legs for pain  . midodrine (PROAMATINE) 2.5 MG tablet Take 1 tablet (2.5 mg total) by mouth 2 (two) times daily as needed. For SBP <=95  . NON FORMULARY Regular /thin diet consistency  . Nutritional Supplement LIQD Take 120 mLs by mouth 3 (three) times daily. MedPass   . Nutritional Supplements (NUTRITIONAL SUPPLEMENT PO) Take 1 each by mouth in the morning and at bedtime. Magic Cup   . pantoprazole (PROTONIX) 40 MG tablet Take 40 mg by mouth daily.   . polyethylene glycol (MIRALAX / GLYCOLAX) packet Take 17 g by mouth daily as needed.   . sodium fluoride (PREVIDENT 5000 PLUS) 1.1 % CREA dental cream Place 1 application onto teeth at bedtime.   . Sodium Phosphates (RA SALINE ENEMA RE) If not relieved by Biscodyl suppository, give disposable Saline Enema rectally X 1 dose/24 hrs as needed (Do not use constipation standing orders for residents with renal failure/CFR less than 30. Contact MD for orders)(Physician Or  . ziprasidone (GEODON) 80 MG capsule Take 80 mg by mouth 2 (two) times daily.    No facility-administered encounter medications on file as of 09/04/2020.    Review of Systems  GENERAL: No fatigue, no fever or chills MOUTH and THROAT: Denies oral discomfort, gingival pain or bleeding RESPIRATORY: no cough, SOB, DOE, wheezing, hemoptysis CARDIAC: No chest  pain, edema or palpitations GI: No abdominal pain, diarrhea, constipation, heart burn, nausea or vomiting GU: Denies dysuria, frequency, hematuria or discharge NEUROLOGICAL: Denies dizziness, syncope, numbness, or headache PSYCHIATRIC: Denies feelings of depression or anxiety. No report of hallucinations, insomnia, paranoia, or agitation   Immunization History  Administered Date(s) Administered  . Influenza-Unspecified 03/17/2017, 09/09/2017, 08/17/2018, 08/29/2019  . Moderna SARS-COVID-2 Vaccination 12/05/2019, 01/30/2020  . PPD Test 03/24/2017, 06/02/2018, 06/08/2018  . Pneumococcal Polysaccharide-23 03/17/2017, 06/12/2018   Pertinent  Health Maintenance Due  Topic Date Due  . DEXA SCAN  Never done  . PNA vac Low Risk Adult (2 of 2 - PCV13) 06/13/2019  . INFLUENZA VACCINE  02/14/2021 (Originally 06/17/2020)   Fall Risk  05/27/2018 05/14/2017  Falls in the past year? No Yes  Number falls in past yr: - 2 or more  Injury with Fall? - No  Vitals:   09/04/20 1038  BP: 103/64  Pulse: 69  Resp: 16  Temp: (!) 97.5 F (36.4 C)  TempSrc: Oral  Weight: 128 lb 6.4 oz (58.2 kg)  Height: 5\' 5"  (1.651 m)   Body mass index is 21.37 kg/m.  Physical Exam  GENERAL APPEARANCE: Well nourished. In no acute distress. Normal body habitus SKIN:  Skin is warm and dry.  MOUTH and THROAT: Lips are without lesions. Oral mucosa is moist and without lesions. Tongue is normal in shape, size, and color and without lesions RESPIRATORY: Breathing is even & unlabored, BS CTAB CARDIAC: RRR, no murmur,no extra heart sounds, no edema GI: Abdomen soft, normal BS, no masses, no tenderness NEUROLOGICAL: +tremor. Speech is clear. Alert to self, disoriented to time and place. PSYCHIATRIC:  Affect and behavior are appropriate  Labs reviewed: Recent Labs    12/22/19 0000 12/26/19 0000 04/03/20 0000  NA 142 144 143  K 3.5 3.9 3.6  CL 106 106 105  CO2 29* 27* 26*  BUN 15 15 15   CREATININE 0.6 0.5 0.5   CALCIUM 9.3 8.7 9.6   Recent Labs    12/22/19 0000 12/26/19 0000  AST 13 11*  ALT 7 5*  ALKPHOS 65 60  ALBUMIN 3.8 3.5   Recent Labs    12/22/19 0000 12/26/19 0000 04/03/20 0000  WBC 5.1 5.7 5.5  NEUTROABS  --   --  3  HGB 12.6 11.8* 12.2  HCT 37 33* 34*  PLT 256 267 262   Lab Results  Component Value Date   TSH 4.70 04/30/2019   Lab Results  Component Value Date   HGBA1C 5.4 03/15/2018   Lab Results  Component Value Date   CHOL 139 03/15/2018   HDL 46 03/15/2018   LDLCALC 74 03/15/2018   TRIG 97 03/15/2018    Assessment/Plan  1. Other drug-induced secondary parkinsonism (HCC) -  Stable, continue carbidopa-levodopa -   Fall precautions  2. Failure to thrive in adult Wt Readings from Last 3 Encounters:  09/04/20 128 lb 6.4 oz (58.2 kg)  08/27/20 128 lb 6.4 oz (58.2 kg)  08/10/20 128 lb 6.4 oz (58.2 kg)   -  Continue med pass and magic cup -  Followed by palliative care  3. Mild intermittent asthma with allergic rhinitis without complication -  No wheezing, continue PRN albuterol  4. Schizoaffective disorder, depressive type (HCC) -  Stable, continue ziprasidone, lithium carbonate, buspirone and duloxetine -Followed by psych NP  5. Vascular dementia without behavioral disturbance (HCC) -Continue memantine and donepezil -Continue supportive care    Family/ staff Communication: Discussed plan of care with resident and charge nurse.  Labs/tests ordered: None  Goals of care:   Long-term care/palliative care   10/27/20, DNP, MSN, FNP-BC [facility] [phone] (Monday-Friday 8:00 a.m. - 5:00 p.m.) [phone] (after hours)

## 2020-09-05 DIAGNOSIS — M25511 Pain in right shoulder: Secondary | ICD-10-CM | POA: Diagnosis not present

## 2020-09-06 DIAGNOSIS — M25511 Pain in right shoulder: Secondary | ICD-10-CM | POA: Diagnosis not present

## 2020-09-07 DIAGNOSIS — M25511 Pain in right shoulder: Secondary | ICD-10-CM | POA: Diagnosis not present

## 2020-09-07 IMAGING — CT CT CERVICAL SPINE W/O CM
4 of 8 series · 11 of 33 positions shown, 12 images · non-contrast
Comparison: CT scan of July 28, 2018.

CLINICAL DATA: Unwitnessed fall.

EXAM:
CT HEAD WITHOUT CONTRAST
CT CERVICAL SPINE WITHOUT CONTRAST
TECHNIQUE: Multidetector CT imaging of the head and cervical spine was
performed following the standard protocol without intravenous
contrast. Multiplanar CT image reconstructions of the cervical spine
were also generated.

[Series 8: c spine soft · axial · 0.30mm/px · z∈[+1480,+1570]mm · 3 of 91 slices shown]
[im 23/91  soft-tissue]
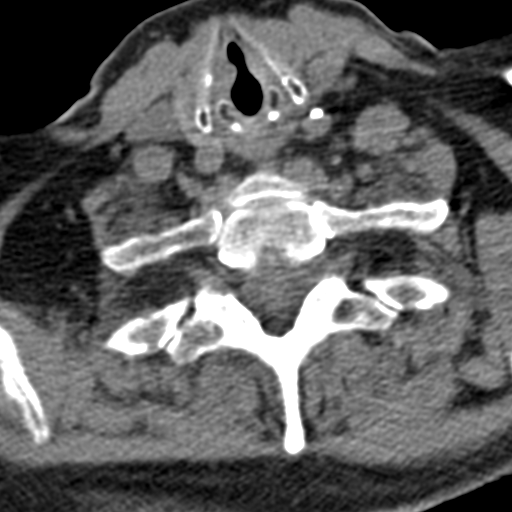
[im 46/91  soft-tissue]
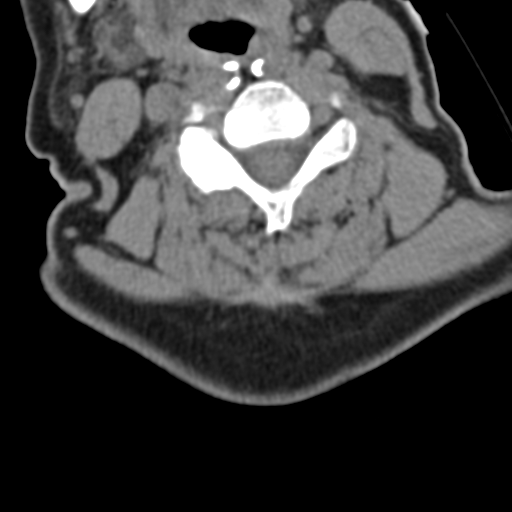
[im 68/91  soft-tissue]
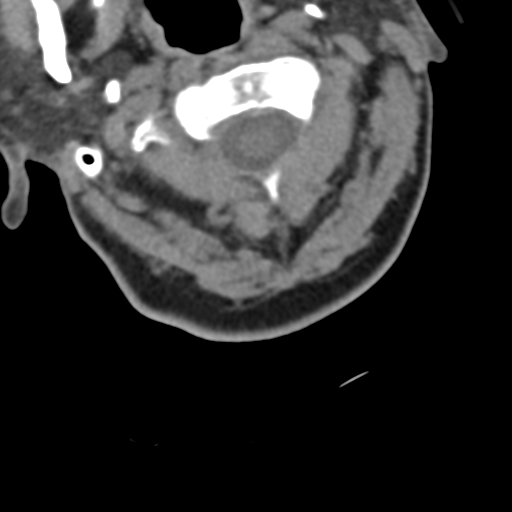

[Series 9: orthogonal bone · axial · 0.20mm/px · z∈[+1463,+1547]mm · 3 of 98 slices shown, 4 images]
[im 25/98  soft-tissue]
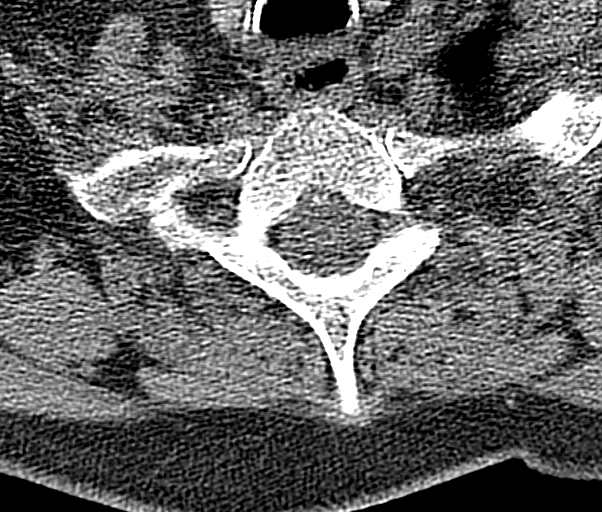
[im 25/98  bone]
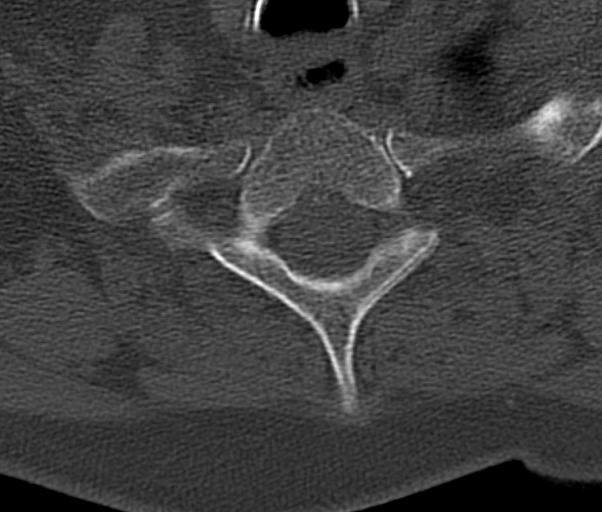
[im 49/98  bone]
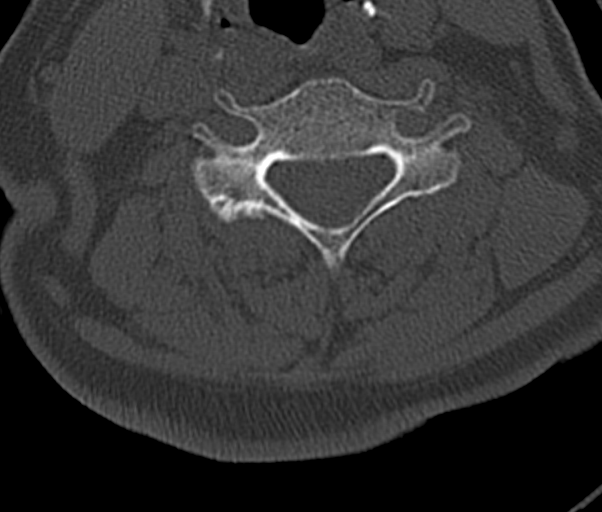
[im 73/98  bone]
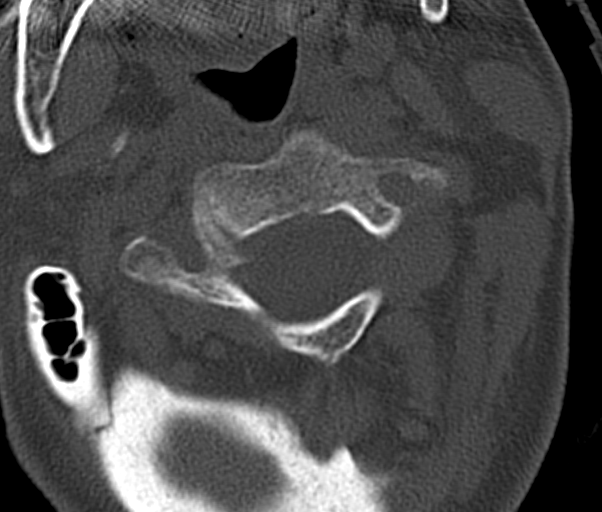

[Series 10: coronal bone · coronal · 0.24mm/px · 1 of 48 slices shown]
[im 24/48  bone]
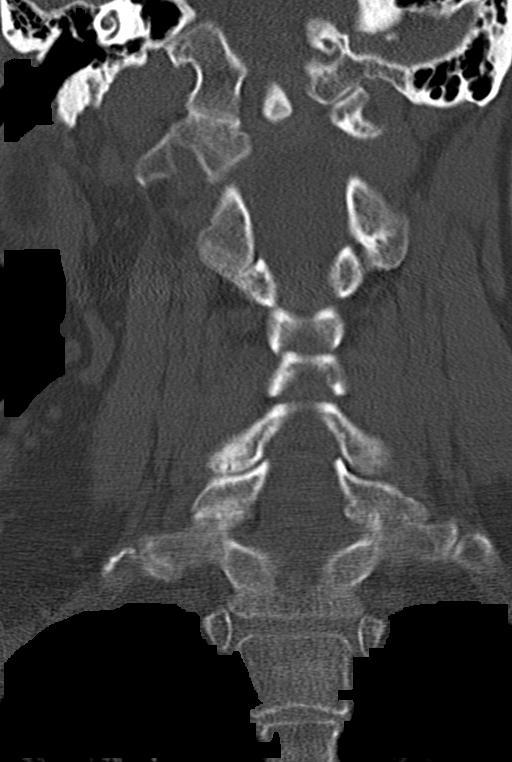

[Series 11: sagittal bone · sagittal · 0.21mm/px · 4 of 47 slices shown]
[im 10/47  bone]
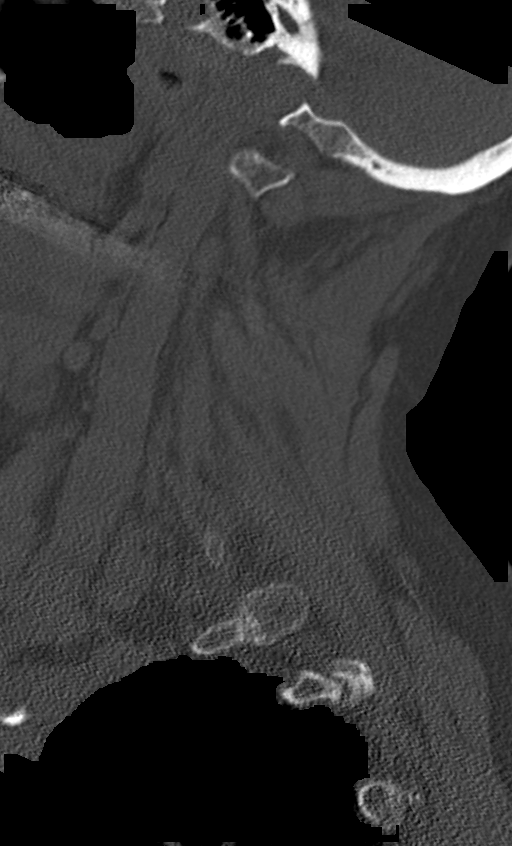
[im 19/47  bone]
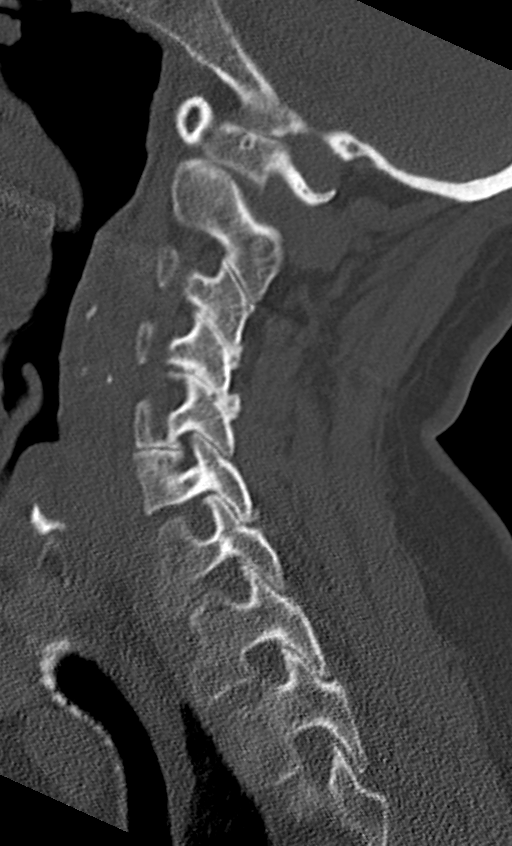
[im 28/47  bone]
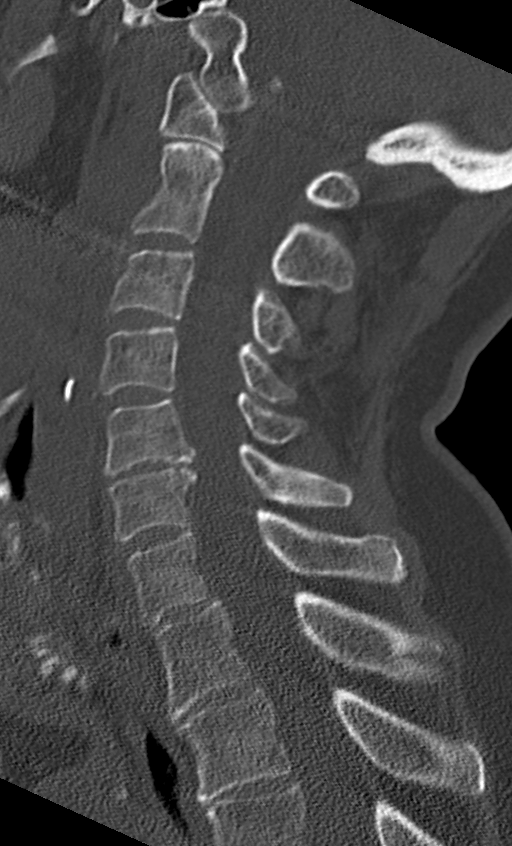
[im 37/47  bone]
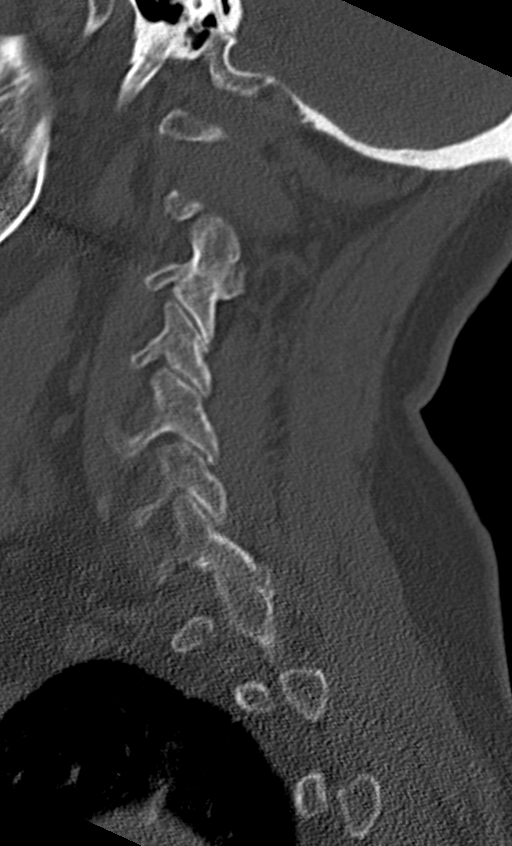

[11 of 33 positions shown; findings below may reference images not displayed]

FINDINGS: CT HEAD FINDINGS

Brain: Mild diffuse cortical atrophy is noted. Mild chronic ischemic
white matter disease is noted. No mass effect or midline shift is
noted. Ventricular size is within normal limits. There is no
evidence of mass lesion, hemorrhage or acute infarction.

Vascular: No hyperdense vessel or unexpected calcification.

Skull: Normal. Negative for fracture or focal lesion.

Sinuses/Orbits: No acute finding.

Other: None.

CT CERVICAL SPINE FINDINGS

Alignment: Normal.

Skull base and vertebrae: No acute fracture. No primary bone lesion
or focal pathologic process.

Soft tissues and spinal canal: No prevertebral fluid or swelling. No
visible canal hematoma.

Disc levels: Moderate degenerative disc disease is noted at C5-6
with posterior osteophyte formation.

Upper chest: Negative.

Other: Mild degenerative changes seen involving the posterior facet
joints on the right.
IMPRESSION: Mild diffuse cortical atrophy. Mild chronic ischemic white matter
disease. No acute intracranial abnormality seen.

Moderate degenerative disc disease is noted at C5-6. No acute
abnormality seen in the cervical spine

## 2020-09-10 DIAGNOSIS — M25511 Pain in right shoulder: Secondary | ICD-10-CM | POA: Diagnosis not present

## 2020-09-11 DIAGNOSIS — M25511 Pain in right shoulder: Secondary | ICD-10-CM | POA: Diagnosis not present

## 2020-09-12 DIAGNOSIS — M25511 Pain in right shoulder: Secondary | ICD-10-CM | POA: Diagnosis not present

## 2020-09-13 DIAGNOSIS — M25511 Pain in right shoulder: Secondary | ICD-10-CM | POA: Diagnosis not present

## 2020-09-14 DIAGNOSIS — M25511 Pain in right shoulder: Secondary | ICD-10-CM | POA: Diagnosis not present

## 2020-09-17 DIAGNOSIS — M25511 Pain in right shoulder: Secondary | ICD-10-CM | POA: Diagnosis not present

## 2020-09-18 DIAGNOSIS — M25511 Pain in right shoulder: Secondary | ICD-10-CM | POA: Diagnosis not present

## 2020-09-19 DIAGNOSIS — M25511 Pain in right shoulder: Secondary | ICD-10-CM | POA: Diagnosis not present

## 2020-09-20 DIAGNOSIS — M25511 Pain in right shoulder: Secondary | ICD-10-CM | POA: Diagnosis not present

## 2020-09-21 DIAGNOSIS — M25511 Pain in right shoulder: Secondary | ICD-10-CM | POA: Diagnosis not present

## 2020-10-02 ENCOUNTER — Other Ambulatory Visit: Payer: Self-pay | Admitting: Adult Health

## 2020-10-02 MED ORDER — LORAZEPAM 1 MG PO TABS: 1.0000 mg | ORAL_TABLET | Freq: Two times a day (BID) | ORAL | 0 refills | Status: DC

## 2020-10-03 DIAGNOSIS — G3183 Dementia with Lewy bodies: Secondary | ICD-10-CM | POA: Diagnosis not present

## 2020-10-03 DIAGNOSIS — F251 Schizoaffective disorder, depressive type: Secondary | ICD-10-CM | POA: Diagnosis not present

## 2020-10-03 DIAGNOSIS — F329 Major depressive disorder, single episode, unspecified: Secondary | ICD-10-CM | POA: Diagnosis not present

## 2020-10-03 DIAGNOSIS — F419 Anxiety disorder, unspecified: Secondary | ICD-10-CM | POA: Diagnosis not present

## 2020-10-08 ENCOUNTER — Non-Acute Institutional Stay (SKILLED_NURSING_FACILITY): Payer: Medicare Other | Admitting: Adult Health

## 2020-10-08 ENCOUNTER — Encounter: Payer: Self-pay | Admitting: Adult Health

## 2020-10-08 DIAGNOSIS — M5441 Lumbago with sciatica, right side: Secondary | ICD-10-CM

## 2020-10-08 DIAGNOSIS — G8929 Other chronic pain: Secondary | ICD-10-CM | POA: Diagnosis not present

## 2020-10-08 DIAGNOSIS — K219 Gastro-esophageal reflux disease without esophagitis: Secondary | ICD-10-CM

## 2020-10-08 DIAGNOSIS — F0150 Vascular dementia without behavioral disturbance: Secondary | ICD-10-CM

## 2020-10-08 DIAGNOSIS — M5442 Lumbago with sciatica, left side: Secondary | ICD-10-CM | POA: Diagnosis not present

## 2020-10-08 DIAGNOSIS — F251 Schizoaffective disorder, depressive type: Secondary | ICD-10-CM | POA: Diagnosis not present

## 2020-10-08 DIAGNOSIS — G2119 Other drug induced secondary parkinsonism: Secondary | ICD-10-CM

## 2020-10-08 NOTE — Progress Notes (Signed)
Location:  [facility] Room Number: 225-B Place of Service:  SNF (31) Provider:  Kenard Gower, DNP, FNP-BC  Patient Care Team: Pecola Lawless, MD as PCP - General (Internal Medicine) Center, [facility] ([facility]) Synetta Shadow as Physician Assistant (Internal Medicine)  Extended Emergency Contact Information Primary Emergency Contact: [name] of [place] Home Phone: [phone] Relation: Daughter Secondary Emergency Contact: [name] [place] of [place] Home Phone: [phone] Relation: Daughter  Code Status:  FULL CODE  Goals of care: Advanced Directive information Advanced Directives 08/27/2020  Does Patient Have a Medical Advance Directive? Yes  Type of Advance Directive Out of facility DNR (pink MOST or yellow form)  Does patient want to make changes to medical advance directive? No - Patient declined  Would patient like information on creating a medical advance directive? -  Pre-existing out of facility DNR order (yellow form or pink MOST form) Pink MOST form placed in chart (order not valid for inpatient use)     Chief Complaint  Patient presents with  . Medical Management of Chronic Issues    Routine [facility] visit  . Immunizations    Prevnar-13, ordered 09/17/20  . Quality Metric Gaps    DEXA scan, ordered 09/17/20    HPI:  Pt is a 74 y.o. [place] seen today for medical management of chronic diseases. She is a long-term care resident of [facility]. She has a PMH of TIA, stroke, Parkinson's disease with hallucinations, dyslipidemia and history of asthma. She prefers to stay in bed instead of being sat on a wheelchair due to lower back pain. She takes Acetaminophen 650 mg BID and BID PRN, Salonpas 4% 2 patches to lower back and Biofreeze 4% gel to bilateral lower legs QID for lower back pain with bilateral sciatica. SBPs ranging from 96 to 106. She  takes Midodrine  2.5 mg BID PRN for SBP<= 95.    Past Medical History:  Diagnosis Date  . Allergy   . Anxiety   . Arthritis   . Asthma   . Benign essential hypertension 03/18/2017  . Cataract   . Dementia (HCC)   . Dementia with Lewy bodies (CODE) (HCC)   . Depression   . Difficulty walking   . Dysphagia, oral phase   . Fibromyalgia   . GERD (gastroesophageal reflux disease)   . Hallucinations   . Hyperlipidemia   . Lower extremity edema   . Repeated falls   . Rhabdomyolysis   . Rhabdomyolysis   . Stroke (HCC)   . TIA (transient ischemic attack)   . Urinary incontinence    Past Surgical History:  Procedure Laterality Date  . ABDOMINAL HYSTERECTOMY    . CATARACT EXTRACTION    . CHOLECYSTECTOMY    . TONSILLECTOMY AND ADENOIDECTOMY      Allergies  Allergen Reactions  . Hydrocodone Other (See Comments)    GI upset    Outpatient Encounter Medications as of 10/08/2020  Medication Sig  . Sodium Phosphates (RA SALINE ENEMA RE) If not relieved by Biscodyl suppository, give disposable Saline Enema rectally X 1 dose/24 hrs as needed (Do not use constipation standing orders for residents with renal failure/CFR less than 30. Contact MD for orders)(Physician Or  . acetaminophen (TYLENOL) 325 MG tablet Take 650 mg by mouth See admin instructions. 650 BID PRN and 650 BID scheduled  . albuterol (PROVENTIL HFA;VENTOLIN HFA) 108 (90 Base) MCG/ACT inhaler Inhale 2 puffs into the lungs every  6 (six) hours as needed for wheezing or shortness of breath.  Marland Kitchen aspirin EC 81 MG tablet Take 81 mg by mouth daily.  . bisacodyl (BISACODYL LAXATIVE) 10 MG suppository Place 10 mg rectally as needed for moderate constipation.  . busPIRone (BUSPAR) 15 MG tablet Take 15 mg by mouth 3 (three) times daily.  . carbidopa-levodopa (SINEMET IR) 25-100 MG tablet Take 1 tablet by mouth 3 (three) times daily.  . cholecalciferol (VITAMIN D) 1000 units tablet Take 1,000 Units by mouth daily.   Marland Kitchen donepezil  (ARICEPT) 10 MG tablet Take 10 mg by mouth at bedtime.   . DULoxetine (CYMBALTA) 30 MG capsule Give 3 tablets (90mg ) by mouth daily  . fluticasone (FLONASE) 50 MCG/ACT nasal spray Place 2 sprays into both nostrils daily.   . Lidocaine (SALONPAS PAIN RELIEVING) 4 % PTCH APPLY 2 PATCHES TOPICALLY TO LOWER BACK ONCE DAILY FOR PAIN (REMOVE AT BEDTIME)  . lithium carbonate 300 MG capsule Take 300 mg by mouth at bedtime.  LORazepam (ATIVAN) 1 MG tablet Take 1 tablet (1 mg total) by mouth every 12 (twelve) hours.  . magnesium hydroxide (MILK OF MAGNESIA) 400 MG/5ML suspension If no BM in 3 days, give 30 cc Milk of Magnesium p.o. x 1 dose in 24 hours as needed (Do not use standing constipation orders for residents with renal failure CFR less than 30. Contact MD for orders) (Physician Order)  . memantine (NAMENDA) 10 MG tablet Take 10 mg by mouth 2 (two) times daily.   . Menthol, Topical Analgesic, (BIOFREEZE) 4 % GEL Apply 1 application topically in the morning, at noon, in the evening, and at bedtime. Apply to bilateral lower legs for pain  . midodrine (PROAMATINE) 2.5 MG tablet Take 1 tablet (2.5 mg total) by mouth 2 (two) times daily as needed. For SBP <=95  . NON FORMULARY Regular /thin diet consistency  . Nutritional Supplement LIQD Take 120 mLs by mouth 3 (three) times daily. MedPass   . Nutritional Supplements (NUTRITIONAL SUPPLEMENT PO) Take 1 each by mouth in the morning and at bedtime. Magic Cup   . pantoprazole (PROTONIX) 40 MG tablet Take 40 mg by mouth daily.   . polyethylene glycol (MIRALAX / GLYCOLAX) packet Take 17 g by mouth daily as needed.   . sodium fluoride (PREVIDENT 5000 PLUS) 1.1 % CREA dental cream Place 1 application onto teeth at bedtime.   . ziprasidone (GEODON) 80 MG capsule Take 80 mg by mouth 2 (two) times daily.    No facility-administered encounter medications on file as of 10/08/2020.    Review of Systems  GENERAL: No change in appetite, no fatigue, no weight  changes, no fever, chills or weakness MOUTH and THROAT: Denies oral discomfort, gingival pain or bleeding RESPIRATORY: no cough, SOB, DOE, wheezing, hemoptysis CARDIAC: No chest pain, edema or palpitations GI: No abdominal pain, diarrhea, constipation, heart burn, nausea or vomiting GU: Denies dysuria, frequency, hematuria or discharge NEUROLOGICAL: Denies dizziness, syncope, numbness, or headache PSYCHIATRIC: Denies feelings of depression or anxiety. No report of hallucinations, insomnia, paranoia, or agitation   Immunization History  Administered Date(s) Administered  . Influenza-Unspecified 03/17/2017, 08/17/2018, 08/29/2019, 08/28/2020  . Moderna SARS-COVID-2 Vaccination 01/02/2020, 01/30/2020  . PPD Test 03/24/2017, 06/02/2018, 06/08/2018  . Pneumococcal Polysaccharide-23 03/17/2017, 06/12/2018   Pertinent  Health Maintenance Due  Topic Date Due  . DEXA SCAN  Never done  . PNA vac Low Risk Adult (2 of 2 - PCV13) 06/13/2019  . INFLUENZA VACCINE  Completed  Fall Risk  05/27/2018 05/14/2017  Falls in the past year? No Yes  Number falls in past yr: - 2 or more  Injury with Fall? - No     Vitals:   10/08/20 1129  BP: 101/63  Pulse: 80  Resp: 18  Temp: (!) 97.4 F (36.3 C)  TempSrc: Oral  Weight: 128 lb 6.4 oz (58.2 kg)  Height: 5\' 5"  (1.651 m)   Body mass index is 21.Jennifer kg/m.  Physical Exam  GENERAL APPEARANCE: Well nourished. In no acute distress. Normal body habitus SKIN:  Skin is warm and dry.  MOUTH and THROAT: Lips are without lesions. Oral mucosa is moist and without lesions. Tongue is normal in shape, size, and color and without lesions RESPIRATORY: Breathing is even & unlabored, BS CTAB CARDIAC: RRR, no murmur,no extra heart sounds, no edema GI: Abdomen soft, normal BS, no masses, no tenderness NEUROLOGICAL: There is no tremor. Speech is clear. Alert to self, disoriented to time and place. PSYCHIATRIC:  Affect and behavior are appropriate  Labs  reviewed: Recent Labs    12/22/19 0000 12/26/19 0000 04/03/20 0000  NA 142 144 143  K 3.5 3.9 3.6  CL 106 106 105  CO2 29* 27* 26*  BUN 15 15 15   CREATININE 0.6 0.5 0.5  CALCIUM 9.3 8.7 9.6   Recent Labs    12/22/19 0000 12/26/19 0000  AST 13 11*  ALT 7 5*  ALKPHOS 65 60  ALBUMIN 3.8 3.5   Recent Labs    12/22/19 0000 12/26/19 0000 04/03/20 0000  WBC 5.1 5.7 5.5  NEUTROABS  --   --  3  HGB 12.6 11.8* 12.2  HCT Jennifer 33* 34*  PLT 256 267 262   Lab Results  Component Value Date   TSH 4.70 04/30/2019   Lab Results  Component Value Date   HGBA1C 5.4 03/15/2018   Lab Results  Component Value Date   CHOL 139 03/15/2018   HDL 46 03/15/2018   LDLCALC 74 03/15/2018   TRIG 97 03/15/2018    Assessment/Plan  1. Chronic bilateral low back pain with bilateral sciatica - stable, continue Salonpas 4% patch, Acetaminophen and Biofreeze 4% gel  2. GERD without esophagitis -  Stable, pantoprazole  3. Schizoaffective disorder, depressive type (HCC) -   Mood is a stable, continue lithium, Ativan and ziprasidone -   Followed by psych NP  4. Other drug-induced secondary parkinsonism (HCC) -    Continue carbidopa-levodopa -    Fall precautions  5. Vascular dementia without behavioral disturbance (HCC) -    Continue donepezil and memantine -    Continue supportive care    Family/ staff Communication: Discussed plan of care with resident and charge nurse.  Labs/tests ordered:  None  Goals of care:   Long-term care/palliative care   03/17/2018, DNP, MSN, FNP-BC [facility] [phone] (Monday-Friday 8:00 a.m. - 5:00 p.m.) [phone] (after hours)

## 2020-10-08 NOTE — Progress Notes (Signed)
Location:  [facility] Room Number: 225-B Place of Service:  SNF (31) Provider:  Kenard Gower, DNP, FNP-BC  Patient Care Team: Pecola Lawless, MD as PCP - General (Internal Medicine) Center, [facility] (Skilled Nursing Facility) Synetta Shadow as Physician Assistant (Internal Medicine)  Extended Emergency Contact Information Primary Emergency Contact: [name] of [place] Home Phone: [phone] Relation: Daughter Secondary Emergency Contact: [name] [place] of [place] Home Phone: [phone] Relation: Daughter  Code Status:  FULL CODE  Goals of care: Advanced Directive information Advanced Directives 08/27/2020  Does Patient Have a Medical Advance Directive? Yes  Type of Advance Directive Out of facility DNR (pink MOST or yellow form)  Does patient want to make changes to medical advance directive? No - Patient declined  Would patient like information on creating a medical advance directive? -  Pre-existing out of facility DNR order (yellow form or pink MOST form) Pink MOST form placed in chart (order not valid for inpatient use)     Chief Complaint  Patient presents with  . Advanced Directive    Patient seen for a Care Plan Meeting    HPI:  Pt is a 74 y.o. female who had a care plan meeting today. She is a long-term care resident of [facility]. She has a PMH of TIA, stroke, Parkinson's disease with hallucinations, dyslipidemia and history of asthma. The meeting was attended by MDS coordinator, social worker, activity director, NP and Mindy, daughter, who attended via telephone conference. She remains to be full code. Discussed medications, vital signs and weights. Answered multiple medical questions of daughter. Restorative eating and swallowing program was discontinued due to resident requiring total assistance with meals.  AROM to LE was also discontinued due to resident  declining to participate. The meeting lasted for 25 minutes.    Past Medical History:  Diagnosis Date  . Allergy   . Anxiety   . Arthritis   . Asthma   . Benign essential hypertension 03/18/2017  . Cataract   . Dementia (HCC)   . Dementia with Lewy bodies (CODE) (HCC)   . Depression   . Difficulty walking   . Dysphagia, oral phase   . Fibromyalgia   . GERD (gastroesophageal reflux disease)   . Hallucinations   . Hyperlipidemia   . Lower extremity edema   . Repeated falls   . Rhabdomyolysis   . Rhabdomyolysis   . Stroke (HCC)   . TIA (transient ischemic attack)   . Urinary incontinence    Past Surgical History:  Procedure Laterality Date  . ABDOMINAL HYSTERECTOMY    . CATARACT EXTRACTION    . CHOLECYSTECTOMY    . TONSILLECTOMY AND ADENOIDECTOMY      Allergies  Allergen Reactions  . Hydrocodone Other (See Comments)    GI upset    Outpatient Encounter Medications as of 10/10/2020  Medication Sig  . acetaminophen (TYLENOL) 325 MG tablet Take 650 mg by mouth See admin instructions. 650 BID PRN and 650 BID scheduled  . albuterol (PROVENTIL HFA;VENTOLIN HFA) 108 (90 Base) MCG/ACT inhaler Inhale 2 puffs into the lungs every 6 (six) hours as needed for wheezing or shortness of breath.  Marland Kitchen aspirin EC 81 MG tablet Take 81 mg by mouth daily.  . bisacodyl (BISACODYL LAXATIVE) 10 MG suppository Place 10 mg rectally as needed for moderate constipation.  . busPIRone (BUSPAR) 15 MG tablet Take 15 mg by mouth 3 (three) times daily.  . carbidopa-levodopa (SINEMET  IR) 25-100 MG tablet Take 1 tablet by mouth 3 (three) times daily.  . cholecalciferol (VITAMIN D) 1000 units tablet Take 1,000 Units by mouth daily.   Marland Kitchen donepezil (ARICEPT) 10 MG tablet Take 10 mg by mouth at bedtime.   . DULoxetine (CYMBALTA) 30 MG capsule Give 3 tablets (90mg ) by mouth daily  . fluticasone (FLONASE) 50 MCG/ACT nasal spray Place 2 sprays into both nostrils daily.   . Lidocaine (SALONPAS PAIN RELIEVING) 4 %  PTCH APPLY 2 PATCHES TOPICALLY TO LOWER BACK ONCE DAILY FOR PAIN (REMOVE AT BEDTIME)  . lithium carbonate 300 MG capsule Take 300 mg by mouth at bedtime.  LORazepam (ATIVAN) 1 MG tablet Take 1 tablet (1 mg total) by mouth every 12 (twelve) hours.  . magnesium hydroxide (MILK OF MAGNESIA) 400 MG/5ML suspension If no BM in 3 days, give 30 cc Milk of Magnesium p.o. x 1 dose in 24 hours as needed (Do not use standing constipation orders for residents with renal failure CFR less than 30. Contact MD for orders) (Physician Order)  . memantine (NAMENDA) 10 MG tablet Take 10 mg by mouth 2 (two) times daily.   . Menthol, Topical Analgesic, (BIOFREEZE) 4 % GEL Apply 1 application topically in the morning, at noon, in the evening, and at bedtime. Apply to bilateral lower legs for pain  . midodrine (PROAMATINE) 2.5 MG tablet Take 1 tablet (2.5 mg total) by mouth 2 (two) times daily as needed. For SBP <=95  . NON FORMULARY Regular /thin diet consistency  . Nutritional Supplement LIQD Take 120 mLs by mouth 3 (three) times daily. MedPass   . Nutritional Supplements (NUTRITIONAL SUPPLEMENT PO) Take 1 each by mouth in the morning and at bedtime. Magic Cup   . pantoprazole (PROTONIX) 40 MG tablet Take 40 mg by mouth daily.   . polyethylene glycol (MIRALAX / GLYCOLAX) packet Take 17 g by mouth daily as needed.   . sodium fluoride (PREVIDENT 5000 PLUS) 1.1 % CREA dental cream Place 1 application onto teeth at bedtime.   . Sodium Phosphates (RA SALINE ENEMA RE) If not relieved by Biscodyl suppository, give disposable Saline Enema rectally X 1 dose/24 hrs as needed (Do not use constipation standing orders for residents with renal failure/CFR less than 30. Contact MD for orders)(Physician Or  . ziprasidone (GEODON) 80 MG capsule Take 80 mg by mouth 2 (two) times daily.    No facility-administered encounter medications on file as of 10/10/2020.    Review of Systems  GENERAL: No change in appetite, no fatigue, no  weight changes, no fever, chills or weakness MOUTH and THROAT: Denies oral discomfort, gingival pain or bleeding RESPIRATORY: no cough, SOB, DOE, wheezing, hemoptysis CARDIAC: No chest pain, edema or palpitations GI: No abdominal pain, diarrhea, constipation, heart burn, nausea or vomiting GU: Denies dysuria, frequency, hematuria or discharge NEUROLOGICAL: Denies dizziness, syncope, numbness, or headache PSYCHIATRIC: Denies feelings of depression or anxiety. No report of hallucinations, insomnia, paranoia, or agitation   Immunization History  Administered Date(s) Administered  . Influenza-Unspecified 03/17/2017, 08/17/2018, 08/29/2019, 08/28/2020  . Moderna SARS-COVID-2 Vaccination 01/02/2020, 01/30/2020  . PPD Test 03/24/2017, 06/02/2018, 06/08/2018  . Pneumococcal Polysaccharide-23 03/17/2017, 06/12/2018   Pertinent  Health Maintenance Due  Topic Date Due  . DEXA SCAN  Never done  . PNA vac Low Risk Adult (2 of 2 - PCV13) 06/13/2019  . INFLUENZA VACCINE  Completed   Fall Risk  05/27/2018 05/14/2017  Falls in the past year? No Yes  Number falls in  past yr: - 2 or more  Injury with Fall? - No     Vitals:   10/10/20 0834  BP: 101/63  Pulse: 62  Resp: 18  Temp: 97.6 F (36.4 C)  TempSrc: Oral  Weight: 128 lb 6.4 oz (58.2 kg)  Height: 5\' 5"  (1.651 m)   Body mass index is 21.37 kg/m.  Physical Exam  GENERAL APPEARANCE: Well nourished. In no acute distress. Normal body habitus SKIN:  Skin is warm and dry.  MOUTH and THROAT: Lips are without lesions. Oral mucosa is moist and without lesions. Tongue is normal in shape, size, and color and without lesions RESPIRATORY: Breathing is even & unlabored, BS CTAB CARDIAC: RRR, no murmur,no extra heart sounds, no edema GI: Abdomen soft, normal BS, no masses, no tenderness NEUROLOGICAL: There is no tremor. Speech is clear. Alert to self, disoriented to time and place. PSYCHIATRIC:  Affect and behavior are appropriate  Labs  reviewed: Recent Labs    12/22/19 0000 12/26/19 0000 04/03/20 0000  NA 142 144 143  K 3.5 3.9 3.6  CL 106 106 105  CO2 29* 27* 26*  BUN 15 15 15   CREATININE 0.6 0.5 0.5  CALCIUM 9.3 8.7 9.6   Recent Labs    12/22/19 0000 12/26/19 0000  AST 13 11*  ALT 7 5*  ALKPHOS 65 60  ALBUMIN 3.8 3.5   Recent Labs    12/22/19 0000 12/26/19 0000 04/03/20 0000  WBC 5.1 5.7 5.5  NEUTROABS  --   --  3  HGB 12.6 11.8* 12.2  HCT 37 33* 34*  PLT 256 267 262   Lab Results  Component Value Date   TSH 4.70 04/30/2019   Lab Results  Component Value Date   HGBA1C 5.4 03/15/2018   Lab Results  Component Value Date   CHOL 139 03/15/2018   HDL 46 03/15/2018   LDLCALC 74 03/15/2018   TRIG 97 03/15/2018    Assessment/Plan  1. Advance care planning -   Remains to be full code -   Discussed medications, vital signs and weights  2. Other drug-induced secondary parkinsonism (HCC) -    Continue PRN midodrine -   Fall precautions  3. GERD without esophagitis -Stable, continue pantoprazole  4. Schizoaffective disorder, depressive type (HCC) -Mood is a stable, continue lithium, Ativan and ziprasidone  5. Chronic bilateral low back pain with bilateral sciatica -   Stable, continue Biofreeze 4% gel, acetaminophen 650 mg BID and BID PRN  6. Vascular dementia without behavioral disturbance (HCC) -Stable, continue memantine and donepezil    Family/ staff Communication:  Discussed plan of care with daughter and IDT.  Labs/tests ordered:  None  Goals of care:   Long-term care   03/17/2018, DNP, MSN, FNP-BC [facility] [phone] (Monday-Friday 8:00 a.m. - 5:00 p.m.) [phone] (after hours)

## 2020-10-10 ENCOUNTER — Non-Acute Institutional Stay (SKILLED_NURSING_FACILITY): Payer: Medicare Other | Admitting: Adult Health

## 2020-10-10 DIAGNOSIS — K219 Gastro-esophageal reflux disease without esophagitis: Secondary | ICD-10-CM | POA: Diagnosis not present

## 2020-10-10 DIAGNOSIS — F251 Schizoaffective disorder, depressive type: Secondary | ICD-10-CM

## 2020-10-10 DIAGNOSIS — Z7189 Other specified counseling: Secondary | ICD-10-CM

## 2020-10-10 DIAGNOSIS — M5442 Lumbago with sciatica, left side: Secondary | ICD-10-CM

## 2020-10-10 DIAGNOSIS — G2119 Other drug induced secondary parkinsonism: Secondary | ICD-10-CM | POA: Diagnosis not present

## 2020-10-10 DIAGNOSIS — F0150 Vascular dementia without behavioral disturbance: Secondary | ICD-10-CM | POA: Diagnosis not present

## 2020-10-10 DIAGNOSIS — M5441 Lumbago with sciatica, right side: Secondary | ICD-10-CM

## 2020-10-10 DIAGNOSIS — G8929 Other chronic pain: Secondary | ICD-10-CM | POA: Diagnosis not present

## 2020-10-19 DIAGNOSIS — Z20818 Contact with and (suspected) exposure to other bacterial communicable diseases: Secondary | ICD-10-CM | POA: Diagnosis not present

## 2020-10-19 DIAGNOSIS — J069 Acute upper respiratory infection, unspecified: Secondary | ICD-10-CM | POA: Diagnosis not present

## 2020-11-05 ENCOUNTER — Other Ambulatory Visit: Payer: Self-pay

## 2020-11-05 DIAGNOSIS — F419 Anxiety disorder, unspecified: Secondary | ICD-10-CM

## 2020-11-05 MED ORDER — LORAZEPAM 1 MG PO TABS: 1.0000 mg | ORAL_TABLET | Freq: Two times a day (BID) | ORAL | 0 refills | Status: DC

## 2020-11-06 ENCOUNTER — Encounter: Payer: Self-pay | Admitting: Adult Health

## 2020-11-06 ENCOUNTER — Non-Acute Institutional Stay (SKILLED_NURSING_FACILITY): Payer: Medicare Other | Admitting: Adult Health

## 2020-11-06 DIAGNOSIS — G2119 Other drug induced secondary parkinsonism: Secondary | ICD-10-CM

## 2020-11-06 DIAGNOSIS — F251 Schizoaffective disorder, depressive type: Secondary | ICD-10-CM | POA: Diagnosis not present

## 2020-11-06 DIAGNOSIS — K219 Gastro-esophageal reflux disease without esophagitis: Secondary | ICD-10-CM

## 2020-11-06 DIAGNOSIS — J309 Allergic rhinitis, unspecified: Secondary | ICD-10-CM

## 2020-11-06 NOTE — Progress Notes (Signed)
Location:  [facility] Room Number: 225-B Place of Service:  SNF (31) Provider:  Kenard Gower, DNP, FNP-BC  Patient Care Team: Pecola Lawless, MD as PCP - General (Internal Medicine) Center, [facility]) Synetta Shadow as Physician Assistant (Internal Medicine)  Extended Emergency Contact Information Primary Emergency Contact: [name] of [place] Home Phone: [phone] Relation: Daughter Secondary Emergency Contact: [name] [place] of [place] Home Phone: [phone] Relation: Daughter  Code Status:  FULL CODE  Goals of care: Advanced Directive information Advanced Directives 08/27/2020  Does Patient Have a Medical Advance Directive? Yes  Type of Advance Directive Out of facility DNR (pink MOST or yellow form)  Does patient want to make changes to medical advance directive? No - Patient declined  Would patient like information on creating a medical advance directive? -  Pre-existing out of facility DNR order (yellow form or pink MOST form) Pink MOST form placed in chart (order not valid for inpatient use)     Chief Complaint  Patient presents with   Medical Management of Chronic Issues    Routine [facility] visit    HPI:  Jennifer Garner is a 74 y.o. female seen today for medical management of chronic diseases.  She is a long-term care resident of [facility].  He has a PMH of TIA, stroke, Parkinson's disease with hallucinations, dyslipidemia and asthma. She was seen in the room today. No noted sneezing. She takes Fluticasone 50 mcg 2 sprays to each nostril daily for allergic rhinitis. She denies having acid reflux. She takes Pantoprazole 40 mg daily for GERD.   Past Medical History:  Diagnosis Date   Allergy    Anxiety    Arthritis    Asthma    Benign essential hypertension 03/18/2017   Cataract    Dementia (HCC)    Dementia with Lewy bodies  (CODE) (HCC)    Depression    Difficulty walking    Dysphagia, oral phase    Fibromyalgia    GERD (gastroesophageal reflux disease)    Hallucinations    Hyperlipidemia    Lower extremity edema    Repeated falls    Rhabdomyolysis    Rhabdomyolysis    Stroke (HCC)    TIA (transient ischemic attack)    Urinary incontinence    Past Surgical History:  Procedure Laterality Date   ABDOMINAL HYSTERECTOMY     CATARACT EXTRACTION     CHOLECYSTECTOMY     TONSILLECTOMY AND ADENOIDECTOMY      Allergies  Allergen Reactions   Hydrocodone Other (See Comments)    GI upset    Outpatient Encounter Medications as of 11/06/2020  Medication Sig   acetaminophen (TYLENOL) 325 MG tablet Take 650 mg by mouth See admin instructions. 650 BID PRN and 650 BID scheduled   albuterol (PROVENTIL HFA;VENTOLIN HFA) 108 (90 Base) MCG/ACT inhaler Inhale 2 puffs into the lungs every 6 (six) hours as needed for wheezing or shortness of breath.   aspirin EC 81 MG tablet Take 81 mg by mouth daily.   bisacodyl (BISACODYL LAXATIVE) 10 MG suppository Place 10 mg rectally as needed for moderate constipation.   busPIRone (BUSPAR) 15 MG tablet Take 15 mg by mouth 3 (three) times daily.   carbidopa-levodopa (SINEMET IR) 25-100 MG tablet Take 1 tablet by mouth 3 (three) times daily.   cholecalciferol (VITAMIN D) 1000 units tablet Take 1,000 Units by mouth daily.    donepezil (ARICEPT) 10 MG tablet  Take 10 mg by mouth at bedtime.    DULoxetine (CYMBALTA) 30 MG capsule Give 3 tablets (90mg ) by mouth daily   fluticasone (FLONASE) 50 MCG/ACT nasal spray Place 2 sprays into both nostrils daily.    Lidocaine (SALONPAS PAIN RELIEVING) 4 % PTCH APPLY 2 PATCHES TOPICALLY TO LOWER BACK ONCE DAILY FOR PAIN (REMOVE AT BEDTIME)   lithium carbonate 300 MG capsule Take 300 mg by mouth at bedtime.   LORazepam (ATIVAN) 1 MG tablet Take 1 tablet (1 mg total) by mouth every 12 (twelve) hours.   magnesium  hydroxide (MILK OF MAGNESIA) 400 MG/5ML suspension If no BM in 3 days, give 30 cc Milk of Magnesium p.o. x 1 dose in 24 hours as needed (Do not use standing constipation orders for residents with renal failure CFR less than 30. Contact MD for orders) (Physician Order)   memantine (NAMENDA) 10 MG tablet Take 10 mg by mouth 2 (two) times daily.    Menthol, Topical Analgesic, (BIOFREEZE) 4 % GEL Apply 1 application topically in the morning, at noon, in the evening, and at bedtime. Apply to bilateral lower legs for pain   midodrine (PROAMATINE) 2.5 MG tablet Take 1 tablet (2.5 mg total) by mouth 2 (two) times daily as needed. For SBP <=95   NON FORMULARY Regular /thin diet consistency   Nutritional Supplement LIQD Take 120 mLs by mouth 3 (three) times daily. MedPass    Nutritional Supplements (NUTRITIONAL SUPPLEMENT PO) Take 1 each by mouth in the morning and at bedtime. Magic Cup    pantoprazole (PROTONIX) 40 MG tablet Take 40 mg by mouth daily.    polyethylene glycol (MIRALAX / GLYCOLAX) packet Take 17 g by mouth daily as needed.    sodium fluoride (PREVIDENT 5000 PLUS) 1.1 % CREA dental cream Place 1 application onto teeth at bedtime.    Sodium Phosphates (RA SALINE ENEMA RE) If not relieved by Biscodyl suppository, give disposable Saline Enema rectally X 1 dose/24 hrs as needed (Do not use constipation standing orders for residents with renal failure/CFR less than 30. Contact MD for orders)(Physician Or   ziprasidone (GEODON) 80 MG capsule Take 80 mg by mouth 2 (two) times daily.    No facility-administered encounter medications on file as of 11/06/2020.    Review of Systems  GENERAL: No change in appetite, no fatigue, no weight changes, no fever, chills or weakness MOUTH and THROAT: Denies oral discomfort, gingival pain or bleeding RESPIRATORY: no cough, SOB, DOE, wheezing, hemoptysis CARDIAC: No chest pain, edema or palpitations GI: No abdominal pain, diarrhea, constipation, heart  burn, nausea or vomiting NEUROLOGICAL: Denies dizziness, syncope, numbness, or headache PSYCHIATRIC: Denies feelings of depression or anxiety. No report of hallucinations, insomnia, paranoia, or agitation    Immunization History  Administered Date(s) Administered   Influenza-Unspecified 03/17/2017, 08/17/2018, 08/29/2019, 08/28/2020   Moderna Sars-Covid-2 Vaccination 01/02/2020, 01/30/2020   PPD Test 03/24/2017, 06/02/2018, 06/08/2018   Pneumococcal Polysaccharide-23 03/17/2017, 06/12/2018   Pertinent  Health Maintenance Due  Topic Date Due   DEXA SCAN  Never done   PNA vac Low Risk Adult (2 of 2 - PCV13) 06/13/2019   INFLUENZA VACCINE  Completed   Fall Risk  05/27/2018 05/14/2017  Falls in the past year? No Yes  Number falls in past yr: - 2 or more  Injury with Fall? - No     Vitals:   11/06/20 0945  BP: (!) 98/50  Pulse: 64  Resp: 17  Temp: (!) 97.3 F (36.3 C)  TempSrc: Oral  Weight: 126 lb 9.6 oz (57.4 kg)  Height: 5\' 5"  (1.651 m)   Body mass index is 21.07 kg/m.  Physical Exam  GENERAL APPEARANCE: Well nourished. In no acute distress. Normal body habitus SKIN:  Skin is warm and dry.  MOUTH and THROAT: Lips are without lesions. Oral mucosa is moist and without lesions.  RESPIRATORY: Breathing is even & unlabored, BS CTAB CARDIAC: RRR, no murmur,no extra heart sounds, no edema GI: Abdomen soft, normal BS, no masses, no tenderness NEUROLOGICAL: There is no tremor. Speech is clear. Alert to self, disoriented to time and place. PSYCHIATRIC:  Affect and behavior are appropriate  Labs reviewed: Recent Labs    12/22/19 0000 12/26/19 0000 04/03/20 0000  NA 142 144 143  K 3.5 3.9 3.6  CL 106 106 105  CO2 29* 27* 26*  BUN 15 15 15   CREATININE 0.6 0.5 0.5  CALCIUM 9.3 8.7 9.6   Recent Labs    12/22/19 0000 12/26/19 0000  AST 13 11*  ALT 7 5*  ALKPHOS 65 60  ALBUMIN 3.8 3.5   Recent Labs    12/22/19 0000 12/26/19 0000 04/03/20 0000  WBC 5.1  5.7 5.5  NEUTROABS  --   --  3  HGB 12.6 11.8* 12.2  HCT 37 33* 34*  PLT 256 267 262   Lab Results  Component Value Date   TSH 4.70 04/30/2019   Lab Results  Component Value Date   HGBA1C 5.4 03/15/2018   Lab Results  Component Value Date   CHOL 139 03/15/2018   HDL 46 03/15/2018   LDLCALC 74 03/15/2018   TRIG 97 03/15/2018    Assessment/Plan  1. Allergic rhinitis, unspecified seasonality, unspecified trigger -Stable, continue with the Kasa nasal spray  2. GERD without esophagitis -Stable, continue pantoprazole  3. Schizoaffective disorder, depressive type (HCC) -Mood is stable, continue duloxetine, lithium carbonate, ziprasidone, buspirone and Ativan -Followed by psych NP  4. Other drug-induced secondary parkinsonism (HCC) -Stable, continue carbidopa/levodopa     Family/ staff Communication: Discussed plan of care with resident and charge nurse.  Labs/tests ordered:  None  Goals of care:   Long-term care   03/17/2018, DNP, MSN, FNP-BC [facility] [phone] (Monday-Friday 8:00 a.m. - 5:00 p.m.) [phone] (after hours)

## 2020-12-04 ENCOUNTER — Other Ambulatory Visit: Payer: Self-pay | Admitting: Adult Health

## 2020-12-04 DIAGNOSIS — F419 Anxiety disorder, unspecified: Secondary | ICD-10-CM

## 2020-12-04 MED ORDER — LORAZEPAM 1 MG PO TABS
1.0000 mg | ORAL_TABLET | Freq: Two times a day (BID) | ORAL | 0 refills | Status: DC
Start: 2020-12-04 — End: 2021-01-03

## 2020-12-06 ENCOUNTER — Encounter: Payer: Self-pay | Admitting: Adult Health

## 2020-12-06 ENCOUNTER — Non-Acute Institutional Stay (SKILLED_NURSING_FACILITY): Payer: Medicare Other | Admitting: Adult Health

## 2020-12-06 DIAGNOSIS — G2119 Other drug induced secondary parkinsonism: Secondary | ICD-10-CM | POA: Diagnosis not present

## 2020-12-06 DIAGNOSIS — F419 Anxiety disorder, unspecified: Secondary | ICD-10-CM

## 2020-12-06 DIAGNOSIS — F251 Schizoaffective disorder, depressive type: Secondary | ICD-10-CM | POA: Diagnosis not present

## 2020-12-06 DIAGNOSIS — K219 Gastro-esophageal reflux disease without esophagitis: Secondary | ICD-10-CM

## 2020-12-06 DIAGNOSIS — F0150 Vascular dementia without behavioral disturbance: Secondary | ICD-10-CM | POA: Diagnosis not present

## 2020-12-06 NOTE — Progress Notes (Signed)
Location:  [facility] Room Number: 225-B Place of Service:  SNF (31) Provider:  Kenard Gower, DNP, FNP-BC  Patient Care Team: Pecola Lawless, MD as PCP - General (Internal Medicine) Center, [facility] (Skilled Nursing Facility) Synetta Shadow as Physician Assistant (Internal Medicine)  Extended Emergency Contact Information Primary Emergency Contact: [name] of [place] Home Phone: [phone] Relation: Daughter Secondary Emergency Contact: [name] [place] of [place] Home Phone: [phone] Relation: Daughter  Code Status:  FULL CODE  Goals of care: Advanced Directive information Advanced Directives 08/27/2020  Does Patient Have a Medical Advance Directive? Yes  Type of Advance Directive Out of facility DNR (pink MOST or yellow form)  Does patient want to make changes to medical advance directive? No - Patient declined  Would patient like information on creating a medical advance directive? -  Pre-existing out of facility DNR order (yellow form or pink MOST form) Pink MOST form placed in chart (order not valid for inpatient use)     Chief Complaint  Patient presents with  . Medical Management of Chronic Issues    Routine [facility] visit    HPI:  Pt is a 75 y.o. female seen today for medical management of chronic diseases. She is a long-term care resident of [facility]. She has a PMH of TIA, stroke, Parkinson's disease with hallucinations, dyslipidemia and history of asthma. She was seen in her room. She prefers staying in her room. Noted to have tremors on bilateral hands.  She takes carbidopa/levodopa 25-100 mg one tab 3 times a day for other drug induced secondary parkinsonism.  She denies having acid reflux.  She takes pantoprazole 40 mg daily for GERD.   Past Medical History:  Diagnosis Date  . Allergy   . Anxiety   . Arthritis   . Asthma   . Benign  essential hypertension 03/18/2017  . Cataract   . Dementia (HCC)   . Dementia with Lewy bodies (CODE) (HCC)   . Depression   . Difficulty walking   . Dysphagia, oral phase   . Fibromyalgia   . GERD (gastroesophageal reflux disease)   . Hallucinations   . Hyperlipidemia   . Lower extremity edema   . Repeated falls   . Rhabdomyolysis   . Rhabdomyolysis   . Stroke (HCC)   . TIA (transient ischemic attack)   . Urinary incontinence    Past Surgical History:  Procedure Laterality Date  . ABDOMINAL HYSTERECTOMY    . CATARACT EXTRACTION    . CHOLECYSTECTOMY    . TONSILLECTOMY AND ADENOIDECTOMY      Allergies  Allergen Reactions  . Hydrocodone Other (See Comments)    GI upset    Outpatient Encounter Medications as of 12/06/2020  Medication Sig  . Sodium Phosphates (RA SALINE ENEMA RE) If not relieved by Biscodyl suppository, give disposable Saline Enema rectally X 1 dose/24 hrs as needed (Do not use constipation standing orders for residents with renal failure/CFR less than 30. Contact MD for orders)(Physician Or  . acetaminophen (TYLENOL) 325 MG tablet Take 650 mg by mouth See admin instructions. 650 BID PRN and 650 BID scheduled  . albuterol (PROVENTIL HFA;VENTOLIN HFA) 108 (90 Base) MCG/ACT inhaler Inhale 2 puffs into the lungs every 6 (six) hours as needed for wheezing or shortness of breath.  Marland Kitchen aspirin EC 81 MG tablet Take 81 mg by mouth daily.  . bisacodyl (DULCOLAX) 10 MG suppository Place 10 mg rectally as needed  for moderate constipation.  . busPIRone (BUSPAR) 15 MG tablet Take 15 mg by mouth 3 (three) times daily.  . carbidopa-levodopa (SINEMET IR) 25-100 MG tablet Take 1 tablet by mouth 3 (three) times daily.  . cholecalciferol (VITAMIN D) 1000 units tablet Take 1,000 Units by mouth daily.   Marland Kitchen donepezil (ARICEPT) 10 MG tablet Take 10 mg by mouth at bedtime.   . DULoxetine (CYMBALTA) 30 MG capsule Give 3 tablets (90mg ) by mouth daily  . fluticasone (FLONASE) 50 MCG/ACT  nasal spray Place 2 sprays into both nostrils daily.   . Lidocaine 4 % PTCH APPLY 2 PATCHES TOPICALLY TO LOWER BACK ONCE DAILY FOR PAIN (REMOVE AT BEDTIME)  . lithium carbonate 300 MG capsule Take 300 mg by mouth at bedtime.  LORazepam (ATIVAN) 1 MG tablet Take 1 tablet (1 mg total) by mouth every 12 (twelve) hours.  . magnesium hydroxide (MILK OF MAGNESIA) 400 MG/5ML suspension If no BM in 3 days, give 30 cc Milk of Magnesium p.o. x 1 dose in 24 hours as needed (Do not use standing constipation orders for residents with renal failure CFR less than 30. Contact MD for orders) (Physician Order)  . memantine (NAMENDA) 10 MG tablet Take 10 mg by mouth 2 (two) times daily.   . Menthol, Topical Analgesic, (BIOFREEZE) 4 % GEL Apply 1 application topically in the morning, at noon, in the evening, and at bedtime. Apply to bilateral lower legs for pain  . midodrine (PROAMATINE) 2.5 MG tablet Take 1 tablet (2.5 mg total) by mouth 2 (two) times daily as needed. For SBP <=95  . NON FORMULARY Regular /thin diet consistency  . Nutritional Supplement LIQD Take 120 mLs by mouth 3 (three) times daily. MedPass  . Nutritional Supplements (NUTRITIONAL SUPPLEMENT PO) Take 1 each by mouth in the morning and at bedtime. Magic Cup  . pantoprazole (PROTONIX) 40 MG tablet Take 40 mg by mouth daily.   . polyethylene glycol (MIRALAX / GLYCOLAX) packet Take 17 g by mouth daily as needed.   . sodium fluoride (PREVIDENT 5000 PLUS) 1.1 % CREA dental cream Place 1 application onto teeth at bedtime.   . ziprasidone (GEODON) 80 MG capsule Take 80 mg by mouth 2 (two) times daily.    No facility-administered encounter medications on file as of 12/06/2020.    Review of Systems  GENERAL: No change in appetite, no fatigue, no weight changes, no fever, chills or weakness MOUTH and THROAT: Denies oral discomfort, gingival pain or bleeding RESPIRATORY: no cough, SOB, DOE, wheezing, hemoptysis CARDIAC: No chest pain, edema or  palpitations GI: No abdominal pain, diarrhea, constipation, heart burn, nausea or vomiting GU: Denies dysuria, frequency, hematuria or discharge NEUROLOGICAL: Denies dizziness, syncope, numbness, or headache PSYCHIATRIC: Denies feelings of depression or anxiety. No report of hallucinations, insomnia, paranoia, or agitation   Immunization History  Administered Date(s) Administered  . Influenza-Unspecified 03/17/2017, 08/17/2018, 08/29/2019, 08/28/2020  . Moderna Sars-Covid-2 Vaccination 01/02/2020, 01/30/2020  . PPD Test 03/24/2017, 06/02/2018, 06/08/2018  . Pneumococcal Polysaccharide-23 03/17/2017, 06/12/2018   Pertinent  Health Maintenance Due  Topic Date Due  . DEXA SCAN  Never done  . PNA vac Low Risk Adult (2 of 2 - PCV13) 06/13/2019  . INFLUENZA VACCINE  Completed   Fall Risk  05/27/2018 05/14/2017  Falls in the past year? No Yes  Number falls in past yr: - 2 or more  Injury with Fall? - No     Vitals:   12/06/20 1428  BP: 140/66  Pulse:  70  Resp: 17  Temp: (!) 97 F (36.1 C)  TempSrc: Oral  Weight: 125 lb 3.2 oz (56.8 kg)  Height: 5\' 5"  (1.651 m)   Body mass index is 20.83 kg/m.  Physical Exam  GENERAL APPEARANCE: Well nourished. In no acute distress. Normal body habitus SKIN:  Skin is warm and dry.   MOUTH and THROAT: Lips are without lesions. Oral mucosa is moist and without lesions.  RESPIRATORY: Breathing is even & unlabored, BS CTAB CARDIAC: RRR, no murmur,no extra heart sounds, no edema GI: Abdomen soft, normal BS, no masses, no tendernes NEUROLOGICAL: There is no tremor. Speech is clear. Alert to self, disoriented to time and place. PSYCHIATRIC:  Affect and behavior are appropriate  Labs reviewed: Recent Labs    12/22/19 0000 12/26/19 0000 04/03/20 0000  NA 142 144 143  K 3.5 3.9 3.6  CL 106 106 105  CO2 29* 27* 26*  BUN 15 15 15   CREATININE 0.6 0.5 0.5  CALCIUM 9.3 8.7 9.6   Recent Labs    12/22/19 0000 12/26/19 0000  AST 13 11*  ALT  7 5*  ALKPHOS 65 60  ALBUMIN 3.8 3.5   Recent Labs    12/22/19 0000 12/26/19 0000 04/03/20 0000  WBC 5.1 5.7 5.5  NEUTROABS  --   --  3  HGB 12.6 11.8* 12.2  HCT 37 33* 34*  PLT 256 267 262   Lab Results  Component Value Date   TSH 4.70 04/30/2019   Lab Results  Component Value Date   HGBA1C 5.4 03/15/2018   Lab Results  Component Value Date   CHOL 139 03/15/2018   HDL 46 03/15/2018   LDLCALC 74 03/15/2018   TRIG 97 03/15/2018    Assessment/Plan  1. Other drug-induced secondary parkinsonism (HCC) -  Continue Carvidopa-Levodopa  2. GERD without esophagitis - stable, continue Pantoprazole  3. Schizoaffective disorder, depressive type (HCC) -  Mood is stable, continue Cymbalta, ziprasidone and lithium carbonate  4. Anxiety -Stable, continue buspirone and Ativan  5. Vascular dementia without behavioral disturbance (HCC) -Continue memantine and donepezil    Family/ staff Communication: Discussed plan of care with resident and charge nurse.  Labs/tests ordered: None  Goals of care:   Long-term care  03/17/2018, DNP, MSN, FNP-BC [facility] [phone] (Monday-Friday 8:00 a.m. - 5:00 p.m.) [phone] (after hours)

## 2020-12-12 DIAGNOSIS — F419 Anxiety disorder, unspecified: Secondary | ICD-10-CM | POA: Diagnosis not present

## 2020-12-12 DIAGNOSIS — G3183 Dementia with Lewy bodies: Secondary | ICD-10-CM | POA: Diagnosis not present

## 2020-12-12 DIAGNOSIS — F329 Major depressive disorder, single episode, unspecified: Secondary | ICD-10-CM | POA: Diagnosis not present

## 2020-12-12 DIAGNOSIS — F251 Schizoaffective disorder, depressive type: Secondary | ICD-10-CM | POA: Diagnosis not present

## 2020-12-14 ENCOUNTER — Other Ambulatory Visit: Payer: Self-pay | Admitting: Internal Medicine

## 2020-12-14 DIAGNOSIS — F419 Anxiety disorder, unspecified: Secondary | ICD-10-CM

## 2020-12-26 DIAGNOSIS — F419 Anxiety disorder, unspecified: Secondary | ICD-10-CM | POA: Diagnosis not present

## 2020-12-26 DIAGNOSIS — F251 Schizoaffective disorder, depressive type: Secondary | ICD-10-CM | POA: Diagnosis not present

## 2020-12-26 DIAGNOSIS — F329 Major depressive disorder, single episode, unspecified: Secondary | ICD-10-CM | POA: Diagnosis not present

## 2020-12-26 DIAGNOSIS — G3183 Dementia with Lewy bodies: Secondary | ICD-10-CM | POA: Diagnosis not present

## 2021-01-03 ENCOUNTER — Encounter: Payer: Self-pay | Admitting: Adult Health

## 2021-01-03 ENCOUNTER — Other Ambulatory Visit: Payer: Self-pay | Admitting: Adult Health

## 2021-01-03 ENCOUNTER — Non-Acute Institutional Stay (SKILLED_NURSING_FACILITY): Payer: Medicare Other | Admitting: Adult Health

## 2021-01-03 DIAGNOSIS — F251 Schizoaffective disorder, depressive type: Secondary | ICD-10-CM

## 2021-01-03 DIAGNOSIS — F0150 Vascular dementia without behavioral disturbance: Secondary | ICD-10-CM

## 2021-01-03 DIAGNOSIS — R441 Visual hallucinations: Secondary | ICD-10-CM | POA: Diagnosis not present

## 2021-01-03 DIAGNOSIS — G2119 Other drug induced secondary parkinsonism: Secondary | ICD-10-CM

## 2021-01-03 DIAGNOSIS — F419 Anxiety disorder, unspecified: Secondary | ICD-10-CM

## 2021-01-03 DIAGNOSIS — K219 Gastro-esophageal reflux disease without esophagitis: Secondary | ICD-10-CM

## 2021-01-03 MED ORDER — LORAZEPAM 1 MG PO TABS: 1.0000 mg | ORAL_TABLET | Freq: Two times a day (BID) | ORAL | 0 refills | Status: DC

## 2021-01-03 NOTE — Progress Notes (Signed)
Location:  [facility] Room Number: 225/B Place of Service:  SNF (31) Provider:  Kenard Gower, DNP, FNP-BC  Patient Care Team: Pecola Lawless, MD as PCP - General (Internal Medicine) Center, [facility] ([facility]) Synetta Shadow as Physician Assistant (Internal Medicine)  Extended Emergency Contact Information Primary Emergency Contact: [name] of [place] Home Phone: [phone] Relation: Daughter Secondary Emergency Contact: [name] [place] of [place] Home Phone: [phone] Relation: Daughter  Code Status:  Full Code  Goals of care: Advanced Directive information Advanced Directives 01/03/2021  Does Patient Have a Medical Advance Directive? Yes  Type of Advance Directive -  Does patient want to make changes to medical advance directive? No - Patient declined  Would patient like information on creating a medical advance directive? -  Pre-existing out of facility DNR order (yellow form or pink MOST form) Pink MOST form placed in chart (order not valid for inpatient use)     Chief Complaint  Patient presents with  . Medical Management of Chronic Issues    Routine Visit of Medical Management     HPI:  Pt is a 75 y.o. female seen today for medical management of chronic diseases.  She has a PMH of TIA, stroke, dyslipidemia and asthma. Daughter reported that resident hallucinated about people not present in the room when she visited. Resident was seen in her room and did not verbalize any hallucinations. She takes duloxetine 90 mg daily for depression.  She takes lithium carbonate 300 mg at bedtime, buspirone 15 mg 3 times a day, Ativan 1 mg every 12 hours and ziprasidone 80 mg twice a day for schizoaffective disorder.  Tremors noted on bilateral hands.  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   Past Medical History:  Diagnosis Date  .  Allergy   . Anxiety   . Arthritis   . Asthma   . Benign essential hypertension 03/18/2017  . Cataract   . Dementia (HCC)   . Dementia with Lewy bodies (CODE) (HCC)   . Depression   . Difficulty walking   . Dysphagia, oral phase   . Fibromyalgia   . GERD (gastroesophageal reflux disease)   . Hallucinations   . Hyperlipidemia   . Lower extremity edema   . Repeated falls   . Rhabdomyolysis   . Rhabdomyolysis   . Stroke (HCC)   . TIA (transient ischemic attack)   . Urinary incontinence    Past Surgical History:  Procedure Laterality Date  . ABDOMINAL HYSTERECTOMY    . CATARACT EXTRACTION    . CHOLECYSTECTOMY    . TONSILLECTOMY AND ADENOIDECTOMY      Allergies  Allergen Reactions  . Hydrocodone Other (See Comments)    GI upset    Outpatient Encounter Medications as of 01/03/2021  Medication Sig  . acetaminophen (TYLENOL) 325 MG tablet Take 650 mg by mouth See admin instructions. 650 BID PRN and 650 BID scheduled  . albuterol (PROVENTIL HFA;VENTOLIN HFA) 108 (90 Base) MCG/ACT inhaler Inhale 2 puffs into the lungs every 6 (six) hours as needed for wheezing or shortness of breath.  Marland Kitchen aspirin EC 81 MG tablet Take 81 mg by mouth daily.  . bisacodyl (DULCOLAX) 10 MG suppository Place 10 mg rectally as needed for moderate constipation.  . busPIRone (BUSPAR) 15 MG tablet Take 15 mg by mouth 3 (three) times daily.  . carbidopa-levodopa (SINEMET IR) 25-100 MG tablet Take 1 tablet by  mouth 3 (three) times daily.  . cholecalciferol (VITAMIN D) 1000 units tablet Take 1,000 Units by mouth daily.   Marland Kitchen donepezil (ARICEPT) 10 MG tablet Take 10 mg by mouth at bedtime.   . DULoxetine (CYMBALTA) 30 MG capsule Give 3 tablets (90mg ) by mouth daily  . fluticasone (FLONASE) 50 MCG/ACT nasal spray Place 2 sprays into both nostrils daily.   . Lidocaine 4 % PTCH APPLY 2 PATCHES TOPICALLY TO LOWER BACK ONCE DAILY FOR PAIN (REMOVE AT BEDTIME)  . lithium carbonate 300 MG capsule Take 300 mg by mouth at  bedtime.  LORazepam (ATIVAN) 1 MG tablet Take 1 tablet (1 mg total) by mouth every 12 (twelve) hours.  . magnesium hydroxide (MILK OF MAGNESIA) 400 MG/5ML suspension If no BM in 3 days, give 30 cc Milk of Magnesium p.o. x 1 dose in 24 hours as needed (Do not use standing constipation orders for residents with renal failure CFR less than 30. Contact MD for orders) (Physician Order)  . memantine (NAMENDA) 10 MG tablet Take 10 mg by mouth 2 (two) times daily.   . Menthol, Topical Analgesic, (BIOFREEZE) 4 % GEL Apply 1 application topically in the morning, at noon, in the evening, and at bedtime. Apply to bilateral lower legs for pain  . midodrine (PROAMATINE) 2.5 MG tablet Take 1 tablet (2.5 mg total) by mouth 2 (two) times daily as needed. For SBP <=95  . NON FORMULARY Regular /thin diet consistency  . Nutritional Supplement LIQD Take 120 mLs by mouth 3 (three) times daily. MedPass  . Nutritional Supplements (NUTRITIONAL SUPPLEMENT PO) Take 1 each by mouth in the morning and at bedtime. Magic Cup  . pantoprazole (PROTONIX) 40 MG tablet Take 40 mg by mouth daily.   . polyethylene glycol (MIRALAX / GLYCOLAX) packet Take 17 g by mouth daily as needed.   . sodium fluoride (PREVIDENT 5000 PLUS) 1.1 % CREA dental cream Place 1 application onto teeth at bedtime.   . Sodium Phosphates (RA SALINE ENEMA RE) If not relieved by Biscodyl suppository, give disposable Saline Enema rectally X 1 dose/24 hrs as needed (Do not use constipation standing orders for residents with renal failure/CFR less than 30. Contact MD for orders)(Physician Or  . ziprasidone (GEODON) 80 MG capsule Take 80 mg by mouth 2 (two) times daily.    No facility-administered encounter medications on file as of 01/03/2021.    Review of Systems  GENERAL: No change in appetite, no fatigue, no weight changes, no fever, chills or weakness MOUTH and THROAT: Denies oral discomfort, gingival pain or bleeding RESPIRATORY: no cough, SOB, DOE,  wheezing, hemoptysis CARDIAC: No chest pain, edema or palpitations GI: No abdominal pain, diarrhea, constipation, heart burn, nausea or vomiting GU: Denies dysuria, frequency, hematuria or discharge NEUROLOGICAL: Denies dizziness, syncope, numbness, or headache PSYCHIATRIC: Denies feelings of depression or anxiety. No report of hallucinations, insomnia, paranoia, or agitation   Immunization History  Administered Date(s) Administered  . Influenza-Unspecified 03/17/2017, 08/17/2018, 08/29/2019, 08/28/2020  . Moderna Sars-Covid-2 Vaccination 01/02/2020, 01/30/2020  . PPD Test 03/24/2017, 06/02/2018, 06/08/2018  . Pneumococcal Polysaccharide-23 03/17/2017, 06/12/2018   Pertinent  Health Maintenance Due  Topic Date Due  . DEXA SCAN  Never done  . PNA vac Low Risk Adult (2 of 2 - PCV13) 06/13/2019  . INFLUENZA VACCINE  Completed   Fall Risk  05/27/2018 05/14/2017  Falls in the past year? No Yes  Number falls in past yr: - 2 or more  Injury with Fall? - No  Vitals:   01/03/21 1514  BP: 116/64  Pulse: 68  Resp: 19  Temp: (!) 97.5 F (36.4 C)  SpO2: 94%  Weight: 123 lb 9.6 oz (56.1 kg)  Height: 5\' 5"  (1.651 m)   Body mass index is 20.57 kg/m.  Physical Exam  GENERAL APPEARANCE: Well nourished. In no acute distress. Normal body habitus SKIN:  Skin is warm and dry.  MOUTH and THROAT: Lips are without lesions. Oral mucosa is moist and without lesions.  RESPIRATORY: Breathing is even & unlabored, BS CTAB CARDIAC: RRR, no murmur,no extra heart sounds, no edema GI: Abdomen soft, normal BS, no masses, no tenderness NEUROLOGICAL: + tremors. Speech is clear. Alert to self, disoriented to time and place. PSYCHIATRIC:  Affect and behavior are appropriate  Labs reviewed: Recent Labs    04/03/20 0000  NA 143  K 3.6  CL 105  CO2 26*  BUN 15  CREATININE 0.5  CALCIUM 9.6    Recent Labs    04/03/20 0000  WBC 5.5  NEUTROABS 3  HGB 12.2  HCT 34*  PLT 262   Lab Results   Component Value Date   TSH 4.70 04/30/2019   Lab Results  Component Value Date   HGBA1C 5.4 03/15/2018   Lab Results  Component Value Date   CHOL 139 03/15/2018   HDL 46 03/15/2018   LDLCALC 74 03/15/2018   TRIG 97 03/15/2018     Assessment/Plan  1. Hallucinations, visual -  Will rule out UTI -   Referred for psych consult  2. GERD without esophagitis -Stable, continue pantoprazole  3. Other drug-induced secondary parkinsonism (HCC) -   Continue carbidopa-levodopa  4. Schizoaffective disorder, depressive type (HCC) -   Has not verbalized hallucinations with staff, continue ziprasidone, active on, buspirone and lithium carbonate  5. Vascular dementia without behavioral disturbance (HCC) -  BIMS score 6/15, ranging in severe cognitive impairment -   Continue donepezil and memantine -    Continue supportive care   Family/ staff Communication: Discussed plan of care with resident and charge nurse.  Labs/tests ordered: Urinalysis with culture and sensitivity, CBC and BMP  Goals of care:   Long-term care   7/15, DNP, MSN, FNP-BC [facility] [phone] (Monday-Friday 8:00 a.m. - 5:00 p.m.) [phone] (after hours)

## 2021-01-04 DIAGNOSIS — R443 Hallucinations, unspecified: Secondary | ICD-10-CM | POA: Diagnosis not present

## 2021-01-04 DIAGNOSIS — N390 Urinary tract infection, site not specified: Secondary | ICD-10-CM | POA: Diagnosis not present

## 2021-01-08 ENCOUNTER — Non-Acute Institutional Stay: Payer: Medicare Other | Admitting: Hospice

## 2021-01-08 ENCOUNTER — Other Ambulatory Visit: Payer: Self-pay

## 2021-01-08 DIAGNOSIS — N390 Urinary tract infection, site not specified: Secondary | ICD-10-CM | POA: Diagnosis not present

## 2021-01-08 DIAGNOSIS — Z515 Encounter for palliative care: Secondary | ICD-10-CM | POA: Diagnosis not present

## 2021-01-08 DIAGNOSIS — F0390 Unspecified dementia without behavioral disturbance: Secondary | ICD-10-CM | POA: Diagnosis not present

## 2021-01-08 DIAGNOSIS — R1311 Dysphagia, oral phase: Secondary | ICD-10-CM | POA: Diagnosis not present

## 2021-01-08 NOTE — Progress Notes (Signed)
Therapist, nutritional Palliative Care Consult Note Telephone: [phone]  Fax: [phone]  PATIENT NAME: Jennifer Garner DOB: 1946/04/29 MRN: [id number]  PRIMARY CARE PROVIDER:   Pecola Lawless, MD Pecola Lawless, MD [address] [place],  [place] [address]  REFERRING PROVIDER: Pecola Lawless, MD Pecola Lawless, MD [address] [place],  [place] [address]  RESPONSIBLE PARTY:  Altha Harm (Daughter)  Phone Number [phone]  Visit is to build trust and highlight Palliative Medicine as specialized medical care for people living with serious illness, aimed at facilitating better quality of life through symptoms relief, assisting with advance care plan and establishing goals of care.   CHIEF COMPLAINT: Follow up palliative visit/urinary tract infection  RECOMMENDATIONS/PLAN:   1. Advance Care Planning/Code Status: Discussion with Mindy on patient's CODE STATUS.  Mindy elected DO NOT RESUSCITATE for patient today.  NP called facility NP [facility] to inform-so facility records can be adjusted accordingly.  2. Goals of Care: Goals of care include to maximize quality of life and symptom management.  Mindy expressed family is interested in hospice service when patient qualifies for it.  NP showed her that they will be a smooth transition to hospice service when the time comes.  Palliative care team will continue to support patient, patient's family, and medical team.  I spent 46 minutes providing this consultation. More than 50% of the time in this consultation was spent on coordinating communication.  -------------------------------------------------------------------------------------------------------------------------------------------------- 3. Symptom management/Plan:  Urinary tract infection: Continue Cipro along with Probiotic as ordered. Take as ordered to completion. Nursing reports no adverse reactions; patient's hallucination  and confusion improving. Encourage adequate fluid intake. Hygiene care discussed, wipe from front to back.  Palliative will continue to monitor for symptom management/decline and make recommendations as needed. Return 2 months or prn. Encouraged to call provider sooner with any concerns.   HISTORY OF PRESENT ILLNESS:  Jennifer Garner is a 75 y.o. female with multiple medical problems including urinary tract infection preceded with worsening confusion and delusion which further impaired her functional status. Seen today, she is confused at baseline; denies urinary symptoms and endorsed antibiotic is helpful.  History of Dementia, Parkinson disease, hx of stroke Dysphagia. History obtained from review of EMR, discussion with patient/family. Review and summarization of Epic records shows history from other than patient. Rest of 10 point ROS asked and negative. Palliative Care was asked to follow this patient by consultation request of Pecola Lawless, MD to help address complex decision making in the context of goals of care.   CODE STATUS: DO NOT RESUSCITATE  PPS: 30%  HOSPICE ELIGIBILITY/DIAGNOSIS: TBD  PAST MEDICAL HISTORY:  Past Medical History:  Diagnosis Date  . Allergy   . Anxiety   . Arthritis   . Asthma   . Benign essential hypertension 03/18/2017  . Cataract   . Dementia (HCC)   . Dementia with Lewy bodies (CODE) (HCC)   . Depression   . Difficulty walking   . Dysphagia, oral phase   . Fibromyalgia   . GERD (gastroesophageal reflux disease)   . Hallucinations   . Hyperlipidemia   . Lower extremity edema   . Repeated falls   . Rhabdomyolysis   . Rhabdomyolysis   . Stroke (HCC)   . TIA (transient ischemic attack)   . Urinary incontinence     SOCIAL HX: @SOCX  Patient in  SNF  for ongoing care   FAMILY HX:  Family History  Problem Relation Age  of Onset  . Non-Hodgkin's lymphoma Mother   . Stroke Mother   . Heart attack Father     Review lab tests/diagnostics No  results for input(s): WBC, HGB, HCT, PLT, MCV in the last 168 hours. No results for input(s): NA, K, CL, CO2, BUN, CREATININE, GLUCOSE in the last 168 hours. Latest GFR by Cockcroft Gault (not valid in AKI or ESRD) CrCl cannot be calculated (Patient's most recent lab result is older than the maximum 21 days allowed.). No results for input(s): AST, ALT, ALKPHOS, GGT in the last 168 hours.  Invalid input(s): TBILI, CONJBILI, ALB, TOTALPROTEIN No components found for: ALB No results for input(s): APTT, INR in the last 168 hours.  Invalid input(s): PTPATIENT No results for input(s): BNP, PROBNP in the last 168 hours.  ALLERGIES:  Allergies  Allergen Reactions  . Hydrocodone Other (See Comments)    GI upset      PERTINENT MEDICATIONS:  Outpatient Encounter Medications as of 01/08/2021  Medication Sig  . acetaminophen (TYLENOL) 325 MG tablet Take 650 mg by mouth See admin instructions. 650 BID PRN and 650 BID scheduled  . albuterol (PROVENTIL HFA;VENTOLIN HFA) 108 (90 Base) MCG/ACT inhaler Inhale 2 puffs into the lungs every 6 (six) hours as needed for wheezing or shortness of breath.  Marland Kitchen aspirin EC 81 MG tablet Take 81 mg by mouth daily.  . bisacodyl (DULCOLAX) 10 MG suppository Place 10 mg rectally as needed for moderate constipation.  . busPIRone (BUSPAR) 15 MG tablet Take 15 mg by mouth 3 (three) times daily.  . carbidopa-levodopa (SINEMET IR) 25-100 MG tablet Take 1 tablet by mouth 3 (three) times daily.  . cholecalciferol (VITAMIN D) 1000 units tablet Take 1,000 Units by mouth daily.   Marland Kitchen donepezil (ARICEPT) 10 MG tablet Take 10 mg by mouth at bedtime.   . DULoxetine (CYMBALTA) 30 MG capsule Give 3 tablets (90mg ) by mouth daily  . fluticasone (FLONASE) 50 MCG/ACT nasal spray Place 2 sprays into both nostrils daily.   . Lidocaine 4 % PTCH APPLY 2 PATCHES TOPICALLY TO LOWER BACK ONCE DAILY FOR PAIN (REMOVE AT BEDTIME)  . lithium carbonate 300 MG capsule Take 300 mg by mouth at bedtime.   LORazepam (ATIVAN) 1 MG tablet Take 1 tablet (1 mg total) by mouth every 12 (twelve) hours.  . magnesium hydroxide (MILK OF MAGNESIA) 400 MG/5ML suspension If no BM in 3 days, give 30 cc Milk of Magnesium p.o. x 1 dose in 24 hours as needed (Do not use standing constipation orders for residents with renal failure CFR less than 30. Contact MD for orders) (Physician Order)  . memantine (NAMENDA) 10 MG tablet Take 10 mg by mouth 2 (two) times daily.   . Menthol, Topical Analgesic, (BIOFREEZE) 4 % GEL Apply 1 application topically in the morning, at noon, in the evening, and at bedtime. Apply to bilateral lower legs for pain  . midodrine (PROAMATINE) 2.5 MG tablet Take 1 tablet (2.5 mg total) by mouth 2 (two) times daily as needed. For SBP <=95  . NON FORMULARY Regular /thin diet consistency  . Nutritional Supplement LIQD Take 120 mLs by mouth 3 (three) times daily. MedPass  . Nutritional Supplements (NUTRITIONAL SUPPLEMENT PO) Take 1 each by mouth in the morning and at bedtime. Magic Cup  . pantoprazole (PROTONIX) 40 MG tablet Take 40 mg by mouth daily.   . polyethylene glycol (MIRALAX / GLYCOLAX) packet Take 17 g by mouth daily as needed.   . sodium fluoride (PREVIDENT  5000 PLUS) 1.1 % CREA dental cream Place 1 application onto teeth at bedtime.   . Sodium Phosphates (RA SALINE ENEMA RE) If not relieved by Biscodyl suppository, give disposable Saline Enema rectally X 1 dose/24 hrs as needed (Do not use constipation standing orders for residents with renal failure/CFR less than 30. Contact MD for orders)(Physician Or  . ziprasidone (GEODON) 80 MG capsule Take 80 mg by mouth 2 (two) times daily.    No facility-administered encounter medications on file as of 01/08/2021.    ROS  General: NAD EYES: denies vision changes ENMT: denies  xerostomia Cardiovascular: denies chest pain Pulmonary: denies  cough, denies SOB  Abdomen: endorses fair appetite, no constipation or diarrhea GU: denies dysuria  or urinary frequency MSK:  endorses ROM limitations, no falls reported Skin: denies rashes or wounds Neurological: endorses weakness, denies pain, denies insomnia Psych: Endorses positive mood Heme/lymph/immuno: denies bruises, abnormal bleeding   PHYSICAL EXAM  Weight 125Is down from 170Ibs a year and half ago Height 5 feet 6 inches General: In no acute distress,  Cardiovascular: regular rate and rhythm Pulmonary: no cough, no increased work of breathing, normal respiratory effort Abdomen: soft, non tender, positive bowel sounds in all quadrants GU:  no suprapubic tenderness Eyes: Normal lids, no discharge, sclera anicteric ENMT: Moist mucous membranes Musculoskeletal:  no edema in BLE Skin: no rash to visible skin, warm without cyanosis Psych: non-anxious affect Neurological: Weakness but otherwise non focal; memory loss and confusion at baseline Heme/lymph/immuno: no bruises, no bleeding  Thank you for the opportunity to participate in the care of Tyresa Prindiville Please call our office at [phone] if we can be of additional assistance.  Note: Portions of this note were generated with Scientist, clinical (histocompatibility and immunogenetics). Dictation errors may occur despite best attempts at proofreading.  Rosaura Carpenter, NP

## 2021-01-09 DIAGNOSIS — F329 Major depressive disorder, single episode, unspecified: Secondary | ICD-10-CM | POA: Diagnosis not present

## 2021-01-09 DIAGNOSIS — R1311 Dysphagia, oral phase: Secondary | ICD-10-CM | POA: Diagnosis not present

## 2021-01-09 DIAGNOSIS — F251 Schizoaffective disorder, depressive type: Secondary | ICD-10-CM | POA: Diagnosis not present

## 2021-01-09 DIAGNOSIS — F29 Unspecified psychosis not due to a substance or known physiological condition: Secondary | ICD-10-CM | POA: Diagnosis not present

## 2021-01-09 DIAGNOSIS — G3183 Dementia with Lewy bodies: Secondary | ICD-10-CM | POA: Diagnosis not present

## 2021-01-09 DIAGNOSIS — F419 Anxiety disorder, unspecified: Secondary | ICD-10-CM | POA: Diagnosis not present

## 2021-01-10 DIAGNOSIS — R1311 Dysphagia, oral phase: Secondary | ICD-10-CM | POA: Diagnosis not present

## 2021-01-11 DIAGNOSIS — R1311 Dysphagia, oral phase: Secondary | ICD-10-CM | POA: Diagnosis not present

## 2021-01-14 DIAGNOSIS — R1311 Dysphagia, oral phase: Secondary | ICD-10-CM | POA: Diagnosis not present

## 2021-01-15 DIAGNOSIS — R1311 Dysphagia, oral phase: Secondary | ICD-10-CM | POA: Diagnosis not present

## 2021-01-16 DIAGNOSIS — F0280 Dementia in other diseases classified elsewhere without behavioral disturbance: Secondary | ICD-10-CM | POA: Diagnosis not present

## 2021-01-16 DIAGNOSIS — R1311 Dysphagia, oral phase: Secondary | ICD-10-CM | POA: Diagnosis not present

## 2021-01-16 LAB — BASIC METABOLIC PANEL
BUN: 15 (ref 4–21)
CO2: 27 — AB (ref 13–22)
Chloride: 104 (ref 99–108)
Creatinine: 0.6 (ref 0.5–1.1)
Glucose: 94
Potassium: 3.9 (ref 3.4–5.3)
Sodium: 140 (ref 137–147)

## 2021-01-16 LAB — COMPREHENSIVE METABOLIC PANEL
Albumin: 4.2 (ref 3.5–5.0)
Calcium: 9.5 (ref 8.7–10.7)
Globulin: 2.4

## 2021-01-16 LAB — CBC AND DIFFERENTIAL
HCT: 37 (ref 36–46)
Hemoglobin: 12.7 (ref 12.0–16.0)
Platelets: 250 (ref 150–399)
WBC: 4.7

## 2021-01-16 LAB — CBC: RBC: 3.9 (ref 3.87–5.11)

## 2021-01-16 LAB — HEPATIC FUNCTION PANEL
ALT: 9 (ref 7–35)
AST: 14 (ref 13–35)
Alkaline Phosphatase: 63 (ref 25–125)
Bilirubin, Total: 0.5

## 2021-01-17 DIAGNOSIS — R1311 Dysphagia, oral phase: Secondary | ICD-10-CM | POA: Diagnosis not present

## 2021-01-18 DIAGNOSIS — R1311 Dysphagia, oral phase: Secondary | ICD-10-CM | POA: Diagnosis not present

## 2021-01-21 DIAGNOSIS — R1311 Dysphagia, oral phase: Secondary | ICD-10-CM | POA: Diagnosis not present

## 2021-01-23 DIAGNOSIS — F251 Schizoaffective disorder, depressive type: Secondary | ICD-10-CM | POA: Diagnosis not present

## 2021-01-23 DIAGNOSIS — F29 Unspecified psychosis not due to a substance or known physiological condition: Secondary | ICD-10-CM | POA: Diagnosis not present

## 2021-01-23 DIAGNOSIS — G3183 Dementia with Lewy bodies: Secondary | ICD-10-CM | POA: Diagnosis not present

## 2021-01-23 DIAGNOSIS — F419 Anxiety disorder, unspecified: Secondary | ICD-10-CM | POA: Diagnosis not present

## 2021-01-23 DIAGNOSIS — F329 Major depressive disorder, single episode, unspecified: Secondary | ICD-10-CM | POA: Diagnosis not present

## 2021-01-25 ENCOUNTER — Non-Acute Institutional Stay: Payer: Medicare Other | Admitting: Hospice

## 2021-01-25 ENCOUNTER — Other Ambulatory Visit: Payer: Self-pay

## 2021-01-25 DIAGNOSIS — Z515 Encounter for palliative care: Secondary | ICD-10-CM

## 2021-01-25 DIAGNOSIS — R531 Weakness: Secondary | ICD-10-CM

## 2021-01-25 DIAGNOSIS — F0390 Unspecified dementia without behavioral disturbance: Secondary | ICD-10-CM

## 2021-01-25 NOTE — Progress Notes (Signed)
Therapist, nutritional Palliative Care Consult Note Telephone: [phone]  Fax: [phone]  PATIENT NAME: Jennifer Garner DOB: [birth year] MRN: [id number]  PRIMARY CARE PROVIDER:   Pecola Lawless, MD Pecola Lawless, MD [address] [place],  [place] [address]  REFERRING PROVIDER: Pecola Lawless, MD Pecola Lawless, MD [address] [place],  [place] [address]  RESPONSIBLE PARTY:Mindy Reed Pandy (Daughter)  Phone Number [phone]   Visit is to build trust and highlight Palliative Medicine as specialized medical care for people living with serious illness, aimed at facilitating better quality of life through symptoms relief, assisting with advance care plan and establishing goals of care.   CHIEF COMPLAINT: Palliative focused visit/Generalized body weakness  RECOMMENDATIONS/PLAN:   1. Advance Care Planning/Code Status:Patient is a Do NOT Resuscitate  2. Goals of Care: Goals of care include to maximize quality of life and symptom management. Family is interested in Hospice care in the future  Palliative care team will continue to support patient, patient's family, and medical team.  3. Symptom management/Plan:  Generalized body weakness: Activity as tolerated. Get patient out of bed to wheelchair daily.  Recommendation to facility NP [facility] diets patient will benefit from OT restorative care. Continue Med pass, regular diet. Assist with feeding to ensure adequate caloric intake Palliative will continue to monitor for symptom management/decline and make recommendations as needed. Return 2 months or prn. Encouraged to call provider sooner with any concerns.   HISTORY OF PRESENT ILLNESS:  [name] is a 75 y.o. female with multiple medical problems including weakness which is chronic related to progressing  Dementia - FAST 7c, Parkinson disease, stroke. Patient is mostly in bed, total care with reduced quality of life. History  obtained from review of EMR, primary nursing staff, discussion with patient/family.  Review and summarization of Epic records shows history from other than patient. Rest of 10 point ROS asked and negative.  Palliative Care was asked to follow this patient by consultation request of Pecola Lawless, MD to help address complex decision making in the context of advance care planning and goals of care clarification.   CODE STATUS: DNR  PPS: 30%  HOSPICE ELIGIBILITY/DIAGNOSIS: TBD  PAST MEDICAL HISTORY:  Past Medical History:  Diagnosis Date  . Allergy   . Anxiety   . Arthritis   . Asthma   . Benign essential hypertension 03/18/2017  . Cataract   . Dementia (HCC)   . Dementia with Lewy bodies (CODE) (HCC)   . Depression   . Difficulty walking   . Dysphagia, oral phase   . Fibromyalgia   . GERD (gastroesophageal reflux disease)   . Hallucinations   . Hyperlipidemia   . Lower extremity edema   . Repeated falls   . Rhabdomyolysis   . Rhabdomyolysis   . Stroke (HCC)   . TIA (transient ischemic attack)   . Urinary incontinence     SOCIAL HX: @SOCX  Patient at  SNF for ongoing care   FAMILY HX:  Family History  Problem Relation Age of Onset  . Non-Hodgkin's lymphoma Mother   . Stroke Mother   . Heart attack Father     Review lab tests/diagnostics No results for input(s): WBC, HGB, HCT, PLT, MCV in the last 168 hours. No results for input(s): NA, K, CL, CO2, BUN, CREATININE, GLUCOSE in the last 168 hours. Latest GFR by Cockcroft Gault (not valid in AKI or ESRD) CrCl cannot be calculated (Patient's most recent lab result is  older than the maximum 21 days allowed.). No results for input(s): AST, ALT, ALKPHOS, GGT in the last 168 hours.  Invalid input(s): TBILI, CONJBILI, ALB, TOTALPROTEIN No components found for: ALB No results for input(s): APTT, INR in the last 168 hours.  Invalid input(s): PTPATIENT No results for input(s): BNP, PROBNP in the last 168  hours.  ALLERGIES:  Allergies  Allergen Reactions  . Hydrocodone Other (See Comments)    GI upset      PERTINENT MEDICATIONS:  Outpatient Encounter Medications as of 01/25/2021  Medication Sig  . acetaminophen (TYLENOL) 325 MG tablet Take 650 mg by mouth See admin instructions. 650 BID PRN and 650 BID scheduled  . albuterol (PROVENTIL HFA;VENTOLIN HFA) 108 (90 Base) MCG/ACT inhaler Inhale 2 puffs into the lungs every 6 (six) hours as needed for wheezing or shortness of breath.  Marland Kitchen aspirin EC 81 MG tablet Take 81 mg by mouth daily.  . bisacodyl (DULCOLAX) 10 MG suppository Place 10 mg rectally as needed for moderate constipation.  . busPIRone (BUSPAR) 15 MG tablet Take 15 mg by mouth 3 (three) times daily.  . carbidopa-levodopa (SINEMET IR) 25-100 MG tablet Take 1 tablet by mouth 3 (three) times daily.  . cholecalciferol (VITAMIN D) 1000 units tablet Take 1,000 Units by mouth daily.   Marland Kitchen donepezil (ARICEPT) 10 MG tablet Take 10 mg by mouth at bedtime.   . DULoxetine (CYMBALTA) 30 MG capsule Give 3 tablets (90mg ) by mouth daily  . fluticasone (FLONASE) 50 MCG/ACT nasal spray Place 2 sprays into both nostrils daily.   . Lidocaine 4 % PTCH APPLY 2 PATCHES TOPICALLY TO LOWER BACK ONCE DAILY FOR PAIN (REMOVE AT BEDTIME)  . lithium carbonate 300 MG capsule Take 300 mg by mouth at bedtime.  LORazepam (ATIVAN) 1 MG tablet Take 1 tablet (1 mg total) by mouth every 12 (twelve) hours.  . magnesium hydroxide (MILK OF MAGNESIA) 400 MG/5ML suspension If no BM in 3 days, give 30 cc Milk of Magnesium p.o. x 1 dose in 24 hours as needed (Do not use standing constipation orders for residents with renal failure CFR less than 30. Contact MD for orders) (Physician Order)  . memantine (NAMENDA) 10 MG tablet Take 10 mg by mouth 2 (two) times daily.   . Menthol, Topical Analgesic, (BIOFREEZE) 4 % GEL Apply 1 application topically in the morning, at noon, in the evening, and at bedtime. Apply to bilateral lower legs  for pain  . midodrine (PROAMATINE) 2.5 MG tablet Take 1 tablet (2.5 mg total) by mouth 2 (two) times daily as needed. For SBP <=95  . NON FORMULARY Regular /thin diet consistency  . Nutritional Supplement LIQD Take 120 mLs by mouth 3 (three) times daily. MedPass  . Nutritional Supplements (NUTRITIONAL SUPPLEMENT PO) Take 1 each by mouth in the morning and at bedtime. Magic Cup  . pantoprazole (PROTONIX) 40 MG tablet Take 40 mg by mouth daily.   . polyethylene glycol (MIRALAX / GLYCOLAX) packet Take 17 g by mouth daily as needed.   . sodium fluoride (PREVIDENT 5000 PLUS) 1.1 % CREA dental cream Place 1 application onto teeth at bedtime.   . Sodium Phosphates (RA SALINE ENEMA RE) If not relieved by Biscodyl suppository, give disposable Saline Enema rectally X 1 dose/24 hrs as needed (Do not use constipation standing orders for residents with renal failure/CFR less than 30. Contact MD for orders)(Physician Or  . ziprasidone (GEODON) 80 MG capsule Take 80 mg by mouth 2 (two) times daily.  No facility-administered encounter medications on file as of 01/25/2021.    ROS  General: NAD EYES: denies vision changes ENMT: denies  xerostomia Cardiovascular: denies chest pain Pulmonary: denies cough, denies SOB  Abdomen: endorses fair appetite, no constipation or diarrhea GU: denies dysuria or urinary frequency MSK: endorses ROM limitations, no falls reported Skin: denies rashes or wounds Neurological: endorses weakness, denies pain, denies insomnia Psych: Endorses positive mood Heme/lymph/immuno: denies bruises, abnormal bleeding   PHYSICAL EXAM  Weight 132 Ibs up from 125 Ibs 2 months ago, down from 170Ibs a year and half ago Height 5 feet 6 inches General: In no acute distress,  Cardiovascular: regular rate and rhythm Pulmonary: no cough, no increased work of breathing, normal respiratory effort Abdomen: soft, non tender, positive bowel sounds in all quadrants GU:  no suprapubic  tenderness Eyes: Normal lids, no discharge, sclera anicteric ENMT: Moist mucous membranes Musculoskeletal:  no edema in BLE, weakness/decreased muscle tone Skin: no rash to visible skin, warm without cyanosis Psych: non-anxious affect Neurological: Weakness but otherwise non focal; memory loss and confusion at baseline Heme/lymph/immuno: no bruises, no bleeding  Thank you for the opportunity to participate in the care of Cordella Nyquist Please call our office at [phone] if we can be of additional assistance.  Note: Portions of this note were generated with Scientist, clinical (histocompatibility and immunogenetics). Dictation errors may occur despite best attempts at proofreading.  Rosaura Carpenter, NP

## 2021-01-29 ENCOUNTER — Encounter: Payer: Self-pay | Admitting: Internal Medicine

## 2021-01-29 ENCOUNTER — Non-Acute Institutional Stay (SKILLED_NURSING_FACILITY): Payer: Medicare Other | Admitting: Internal Medicine

## 2021-01-29 DIAGNOSIS — G20 Parkinson's disease: Secondary | ICD-10-CM | POA: Diagnosis not present

## 2021-01-29 DIAGNOSIS — F0150 Vascular dementia without behavioral disturbance: Secondary | ICD-10-CM

## 2021-01-29 DIAGNOSIS — R9431 Abnormal electrocardiogram [ECG] [EKG]: Secondary | ICD-10-CM | POA: Diagnosis not present

## 2021-01-29 DIAGNOSIS — F251 Schizoaffective disorder, depressive type: Secondary | ICD-10-CM

## 2021-01-29 DIAGNOSIS — Z9189 Other specified personal risk factors, not elsewhere classified: Secondary | ICD-10-CM | POA: Diagnosis not present

## 2021-01-29 NOTE — Assessment & Plan Note (Addendum)
[employer] questions the polypharmacy with increased risk of adverse reactions and I am in complete agreement.  Unfortunately the neurologic and psychiatric consultations have not resulted in deprescribing. I'll wean and discontinue the generic Namenda and consider doing the same with Aricept later as the dementia is most likely vascular in nature based on cns imaging.

## 2021-01-29 NOTE — Patient Instructions (Signed)
See assessment and plan under each diagnosis in the problem list and acutely for this visit 

## 2021-01-29 NOTE — Assessment & Plan Note (Addendum)
I believe her dementia is not likely Alzheimer's or Lewey body in nature and the Namenda and Aricept probably offer little benefit.  I will wean the Namenda and discontinue it and monitor.  Perhaps Aricept could be weaned and discontinued subsequently. The caveat here is I previously weaned and discontinued her Parkinson's medicines and the parkinsonian picture got worse.  Dr. Arbutus Leas believes that this may be related to the Geodon.  Input from the Psych NP will also be requested.

## 2021-01-29 NOTE — Progress Notes (Signed)
NURSING HOME LOCATION:  [facility]  ROOM NUMBER:  [id number]  CODE STATUS:  DNR  PCP:  Douglass Rivers MD  This is a nursing facility follow up visit of chronic medical diagnoses & to document compliance with Regulation 483.30 (c) in The Long Term Care Survey Manual Phase 2 which mandates caregiver visit ( visits can alternate among physician, PA or NP as per statutes) within 10 days of 30 days / 60 days/ 90 days post admission to SNF date    Interim medical record and care since last SNF visit was updated with review of diagnostic studies and change in clinical status since last visit were documented.  HPI: She is a permanent resident of facility with medical diagnoses of history of TIA, history of stroke, history of rhabdomyolysis, chronic depression, dementia, and bipolar disorder.  Neurology has consulted for possible Parkinson's; concern has been relationship to her psychotropic medications.  Psychiatric NP has followed the patient here at the facility. Rosann Auerbach, her insurance carrier ,has appropriately questioned the CNS polypharmacy regimen she is taking.  This includes the parkinsonian agent carbidopa-levodopa as well as duloxetine, namenda, Aricept, Geodon, donepezil, lithium, buspirone, and lorazepam. Neurologist, Dr. Arbutus Leas saw the patient 06/05/2020.  She talked with the daughter about the inability to tell whether the Parkinson's picture is true idiopathic Parkinson's disease or an adverse effect of Geodon.  The imaging 12/17/2018 revealed mild diffuse cortical atrophy, mild chronic ischemic white matter disease but no acute process.  Review of systems: Dementia invalidated responses.  Initially she stated "my legs hurt" but did not expound.  She could not tell me for how long her legs have been hurting.  After this 1 interchange; she became nonverbal.  Physical exam:  Pertinent or positive findings: It was approximately 3 PM and she was still in bed asleep.  She could be  aroused but there was no eye contact.  Initially she did slowly move her head to the left to view me but then began to look straight ahead again.  Facies are masked.  Hair is thin.  Teeth are coated.  Heart sounds are somewhat distant.  Breath sounds are decreased.  Dorsalis pedis pulses are stronger than the posterior tibial pulses.  The left great toenail was thickened and deformed.  She has temporal & interosseous wasting.  She is profoundly weak to opposition in all extremities.  When she does try to lift her arms or legs myoclonic jerking is present greater in the upper extremities than the lower extremities.  General appearance: no acute distress, increased work of breathing is present.   Lymphatic: No lymphadenopathy about the head, neck, axilla. Eyes: No conjunctival inflammation or lid edema is present. There is no scleral icterus. Ears:  External ear exam shows no significant lesions or deformities.   Nose:  External nasal examination shows no deformity or inflammation. Nasal mucosa are pink and moist without lesions, exudates Oral exam:  Lips and gums are healthy appearing. There is no oropharyngeal erythema or exudate. Neck:  No thyromegaly, masses, tenderness noted.    Heart:  No gallop, murmur, click, rub .  Lungs: without wheezes, rhonchi, rales, rubs. Abdomen: Bowel sounds are normal. Abdomen is soft and nontender with no organomegaly, hernias, masses. GU: Deferred  Extremities:  No cyanosis, clubbing, edema  Neurologic exam :Balance, Rhomberg, finger to nose testing could not be completed due to clinical state Skin: Warm & dry w/o tenting. No significant lesions or rash.  See summary under  each active problem in the Problem List with associated updated therapeutic plan

## 2021-01-29 NOTE — Assessment & Plan Note (Signed)
High-dose Geodon could be causing the parkinsonian picture; but I am reluctant to wean and discontinue the Parkinson drugs as the clinical picture worsened off these.

## 2021-01-29 NOTE — Assessment & Plan Note (Addendum)
Lithium level 0.75 ( 0.6-1.2). Continuation of Lithium deferred to Psych NP, Jacqulyn Bath NP

## 2021-01-30 DIAGNOSIS — G3183 Dementia with Lewy bodies: Secondary | ICD-10-CM | POA: Diagnosis not present

## 2021-01-30 DIAGNOSIS — F419 Anxiety disorder, unspecified: Secondary | ICD-10-CM | POA: Diagnosis not present

## 2021-01-30 DIAGNOSIS — F329 Major depressive disorder, single episode, unspecified: Secondary | ICD-10-CM | POA: Diagnosis not present

## 2021-01-30 DIAGNOSIS — F29 Unspecified psychosis not due to a substance or known physiological condition: Secondary | ICD-10-CM | POA: Diagnosis not present

## 2021-01-30 DIAGNOSIS — F251 Schizoaffective disorder, depressive type: Secondary | ICD-10-CM | POA: Diagnosis not present

## 2021-01-30 DIAGNOSIS — R946 Abnormal results of thyroid function studies: Secondary | ICD-10-CM | POA: Diagnosis not present

## 2021-01-30 DIAGNOSIS — G20 Parkinson's disease: Secondary | ICD-10-CM | POA: Diagnosis not present

## 2021-01-30 LAB — TSH: TSH: 4.07 (ref 0.41–5.90)

## 2021-02-07 ENCOUNTER — Other Ambulatory Visit: Payer: Self-pay | Admitting: Adult Health

## 2021-02-07 DIAGNOSIS — F419 Anxiety disorder, unspecified: Secondary | ICD-10-CM

## 2021-02-07 MED ORDER — LORAZEPAM 1 MG PO TABS: 1.0000 mg | ORAL_TABLET | Freq: Two times a day (BID) | ORAL | 0 refills | Status: DC

## 2021-02-13 ENCOUNTER — Non-Acute Institutional Stay (SKILLED_NURSING_FACILITY): Payer: Medicare Other | Admitting: Adult Health

## 2021-02-13 ENCOUNTER — Encounter: Payer: Self-pay | Admitting: Adult Health

## 2021-02-13 DIAGNOSIS — G903 Multi-system degeneration of the autonomic nervous system: Secondary | ICD-10-CM

## 2021-02-13 DIAGNOSIS — F251 Schizoaffective disorder, depressive type: Secondary | ICD-10-CM | POA: Diagnosis not present

## 2021-02-13 DIAGNOSIS — J4520 Mild intermittent asthma, uncomplicated: Secondary | ICD-10-CM

## 2021-02-13 DIAGNOSIS — Z7189 Other specified counseling: Secondary | ICD-10-CM | POA: Diagnosis not present

## 2021-02-13 DIAGNOSIS — F0150 Vascular dementia without behavioral disturbance: Secondary | ICD-10-CM | POA: Diagnosis not present

## 2021-02-13 DIAGNOSIS — G2119 Other drug induced secondary parkinsonism: Secondary | ICD-10-CM | POA: Diagnosis not present

## 2021-02-13 NOTE — Progress Notes (Signed)
Location:  [facility] Room Number: 225 Place of Service:  SNF (31) Provider:  Kenard Gower, DNP, FNP-BC  Patient Care Team: Pecola Lawless, MD as PCP - General (Internal Medicine) Center, [facility] (Skilled Nursing Facility) Synetta Shadow as Physician Assistant (Internal Medicine)  Extended Emergency Contact Information Primary Emergency Contact: [name] of [place] Home Phone: [phone] Relation: Daughter Secondary Emergency Contact: Jennifer [place] Home Phone: [phone] Relation: Daughter  Code Status:  DNR  Goals of care: Advanced Directive information Advanced Directives 01/29/2021  Does Patient Have a Medical Advance Directive? Yes  Type of Advance Directive -  Does patient want to make changes to medical advance directive? No - Patient declined  Would patient like information on creating a medical advance directive? -  Pre-existing out of facility DNR order (yellow form or pink MOST form) Pink MOST form placed in chart (order not valid for inpatient use)     Chief Complaint  Patient presents with  . Acute Visit    Care plan  . Health Maintenance    PCV13, Dexa scan  Daughter declined COVID-19 booster    HPI:  Pt is a 75 y.o. female seen today for medical management of chronic diseases. She has a PMH of TIA, stroke, dyslipidemia and asthma.Her daughter refused COVID-19 booster. She had a care plan meeting attended by MDS X 2, social worker, NP and daughter, who attended via telephone conference. She is currently followed by Palliative Care. She remains to be DNR. Discussed medications, vital signs and weights. Lithium dosage was increased to 450 mg at bedtime. Daughter reported that resident's hallucination is now resolved. Resident is not an active participant with social activities. Daughter requested for a room change. Social worker will work on the bed availability. She  has gained weight, with latest weight 132 lbs. Answered medical questions of daughter. The meeting lasted for 25 minutes   Past Medical History:  Diagnosis Date  . Allergy   . Anxiety   . Arthritis   . Asthma   . Benign essential hypertension 03/18/2017  . Cataract   . Dementia (HCC)   . Dementia with Lewy bodies (CODE) (HCC)   . Depression   . Difficulty walking   . Dysphagia, oral phase   . Fibromyalgia   . GERD (gastroesophageal reflux disease)   . Hallucinations   . Hyperlipidemia   . Lower extremity edema   . Repeated falls   . Rhabdomyolysis   . Stroke (HCC)   . TIA (transient ischemic attack)   . Urinary incontinence    Past Surgical History:  Procedure Laterality Date  . ABDOMINAL HYSTERECTOMY    . CATARACT EXTRACTION    . CHOLECYSTECTOMY    . TONSILLECTOMY AND ADENOIDECTOMY      Allergies  Allergen Reactions  . Hydrocodone Other (See Comments)    GI upset    Outpatient Encounter Medications as of 02/13/2021  Medication Sig  . acetaminophen (TYLENOL) 325 MG tablet Take 650 mg by mouth See admin instructions. 650 BID PRN and 650 BID scheduled  . albuterol (PROVENTIL HFA;VENTOLIN HFA) 108 (90 Base) MCG/ACT inhaler Inhale 2 puffs into the lungs every 6 (six) hours as needed for wheezing or shortness of breath.  Marland Kitchen aspirin EC 81 MG tablet Take 81 mg by mouth daily.  . bisacodyl (DULCOLAX) 10 MG suppository Place 10 mg rectally as needed for moderate constipation.  . busPIRone (BUSPAR) 15 MG tablet Take 15 mg  by mouth 3 (three) times daily.  . carbidopa-levodopa (SINEMET IR) 25-100 MG tablet Take 1 tablet by mouth 3 (three) times daily.  . cholecalciferol (VITAMIN D) 1000 units tablet Take 1,000 Units by mouth daily.   Marland Kitchen donepezil (ARICEPT) 10 MG tablet Take 10 mg by mouth at bedtime.   . DULoxetine (CYMBALTA) 30 MG capsule Give 3 tablets (90mg ) by mouth daily  . fluticasone (FLONASE) 50 MCG/ACT nasal spray Place 2 sprays into both nostrils daily.   . Lidocaine  (SALONPAS PAIN RELIEVING) 4 % PTCH Apply topically. APPLY 2 PATCHES TOPICALLY TO LOWER BACK ONCE DAILY FOR PAIN (REMOVE AT BEDTIME)  . Lidocaine 4 % PTCH APPLY 2 PATCHES TOPICALLY TO LOWER BACK ONCE DAILY FOR PAIN (REMOVE AT BEDTIME)  . LITHIUM CARBONATE PO Take 450 mg by mouth at bedtime.  LORazepam (ATIVAN) 1 MG tablet Take 1 tablet (1 mg total) by mouth every 12 (twelve) hours.  . magnesium hydroxide (MILK OF MAGNESIA) 400 MG/5ML suspension If no BM in 3 days, give 30 cc Milk of Magnesium p.o. x 1 dose in 24 hours as needed (Do not use standing constipation orders for residents with renal failure CFR less than 30. Contact MD for orders) (Physician Order)  . memantine (NAMENDA) 10 MG tablet Take 10 mg by mouth 2 (two) times daily.   . Menthol, Topical Analgesic, (BIOFREEZE) 4 % GEL Apply 1 application topically in the morning, at noon, in the evening, and at bedtime. Apply to bilateral lower legs for pain  . midodrine (PROAMATINE) 2.5 MG tablet Take 1 tablet (2.5 mg total) by mouth 2 (two) times daily as needed. For SBP <=95  . NON FORMULARY Regular /thin diet consistency  . Nutritional Supplement LIQD Take 120 mLs by mouth 3 (three) times daily. MedPass  . Nutritional Supplements (NUTRITIONAL SUPPLEMENT PO) Take 1 each by mouth in the morning and at bedtime. Magic Cup  . pantoprazole (PROTONIX) 40 MG tablet Take 40 mg by mouth daily.   . polyethylene glycol (MIRALAX / GLYCOLAX) packet Take 17 g by mouth daily as needed.   . sodium fluoride (PREVIDENT 5000 PLUS) 1.1 % CREA dental cream Place 1 application onto teeth at bedtime.   . Sodium Phosphates (RA SALINE ENEMA RE) If not relieved by Biscodyl suppository, give disposable Saline Enema rectally X 1 dose/24 hrs as needed (Do not use constipation standing orders for residents with renal failure/CFR less than 30. Contact MD for orders)(Physician Or  . ziprasidone (GEODON) 80 MG capsule Take 80 mg by mouth 2 (two) times daily.    No  facility-administered encounter medications on file as of 02/13/2021.    Review of Systems  GENERAL: No change in appetite, no fatigue, no weight changes, no fever, chills or weakness MOUTH and THROAT: Denies oral discomfort, gingival pain or bleeding RESPIRATORY: no cough, SOB, DOE, wheezing, hemoptysis CARDIAC: No chest pain, edema or palpitations GI: No abdominal pain, diarrhea, constipation, heart burn, nausea or vomiting GU: Denies dysuria, frequency, hematuria or discharge NEUROLOGICAL: Denies dizziness, syncope, numbness, or headache PSYCHIATRIC: Denies feelings of depression or anxiety. No report of hallucinations, insomnia, paranoia, or agitation   Immunization History  Administered Date(s) Administered  . Influenza-Unspecified 03/17/2017, 08/17/2018, 08/29/2019, 08/28/2020  . Moderna Sars-Covid-2 Vaccination 01/02/2020, 01/30/2020  . PPD Test 03/24/2017, 06/02/2018, 06/08/2018  . Pneumococcal Polysaccharide-23 03/17/2017, 06/12/2018   Pertinent  Health Maintenance Due  Topic Date Due  . DEXA SCAN  Never done  . PNA vac Low Risk Adult (2 of 2 -  PCV13) 06/13/2019  . INFLUENZA VACCINE  06/17/2021   Fall Risk  05/27/2018 05/14/2017  Falls in the past year? No Yes  Number falls in past yr: - 2 or more  Injury with Fall? - No     Vitals:   02/13/21 1616  BP: (!) 95/53  Pulse: 70  Resp: 15  Temp: 97.7 F (36.5 C)  Weight: 132 lb (59.9 kg)  Height: 5\' 5"  (1.651 m)   Body mass index is 21.97 kg/m.  Physical Exam  GENERAL APPEARANCE: Well nourished. In no acute distress. Normal body habitus SKIN:  Skin is warm and dry.  MOUTH and THROAT: Lips are without lesions. Oral mucosa is moist and without lesions.  RESPIRATORY: Breathing is even & unlabored, BS CTAB CARDIAC: RRR, no murmur,no extra heart sounds, no edema GI: Abdomen soft, normal BS, no masses, no tenderness NEUROLOGICAL: +mild tremor. Speech is clear. Alert to self, disoriented to time and  place. PSYCHIATRIC:  Affect and behavior are appropriate  Labs reviewed: Recent Labs    04/03/20 0000 01/16/21 0000  NA 143 140  K 3.6 3.9  CL 105 104  CO2 26* 27*  BUN 15 15  CREATININE 0.5 0.6  CALCIUM 9.6 9.5   Recent Labs    01/16/21 0000  AST 14  ALT 9  ALKPHOS 63  ALBUMIN 4.2   Recent Labs    04/03/20 0000 01/16/21 0000  WBC 5.5 4.7  NEUTROABS 3  --   HGB 12.2 12.7  HCT 34* 37  PLT 262 250   Lab Results  Component Value Date   TSH 4.07 01/30/2021   Lab Results  Component Value Date   HGBA1C 5.4 03/15/2018   Lab Results  Component Value Date   CHOL 139 03/15/2018   HDL 46 03/15/2018   LDLCALC 74 03/15/2018   TRIG 97 03/15/2018      Assessment/Plan  1. Advance care planning -  Remains to be DNR -   Discussed medications, vital signs and weights  2. Mild intermittent asthma with allergic rhinitis without complication -  No wheezing, continue PRN Albuterol and Flonase nasal spray  3. Neurogenic orthostatic hypotension (HCC) -  BPs stable, continue PRN Midodrine  4. Other drug-induced secondary parkinsonism (HCC) -   Continue Carbidopa-Levodopa  5. Schizoaffective disorder, depressive type (HCC) -  Hallucinations subsided according to daughter -   Continue Duloxetine, Lithium Carbonate, buspirone, Ziprasidone -   Followed by psych NP  6. Vascular dementia without behavioral disturbance (HCC) -   BIMS score 9/15, ranging in moderately impaired cognition -   Continue Memantine and Donepezil    Family/ staff Communication:   Discussed plan of care with daughter and IDT.  Labs/tests ordered:  None  Goals of care:   Long-term care/Palliative care   10/15, DNP, MSN, FNP-BC [facility] and Adult Medicine [phone] (Monday-Friday 8:00 a.m. - 5:00 p.m.) [phone] (after hours)

## 2021-02-20 DIAGNOSIS — F29 Unspecified psychosis not due to a substance or known physiological condition: Secondary | ICD-10-CM | POA: Diagnosis not present

## 2021-02-20 DIAGNOSIS — G3183 Dementia with Lewy bodies: Secondary | ICD-10-CM | POA: Diagnosis not present

## 2021-02-20 DIAGNOSIS — F329 Major depressive disorder, single episode, unspecified: Secondary | ICD-10-CM | POA: Diagnosis not present

## 2021-02-20 DIAGNOSIS — F0280 Dementia in other diseases classified elsewhere without behavioral disturbance: Secondary | ICD-10-CM | POA: Diagnosis not present

## 2021-02-20 DIAGNOSIS — F251 Schizoaffective disorder, depressive type: Secondary | ICD-10-CM | POA: Diagnosis not present

## 2021-02-20 DIAGNOSIS — F419 Anxiety disorder, unspecified: Secondary | ICD-10-CM | POA: Diagnosis not present

## 2021-02-28 ENCOUNTER — Encounter: Payer: Self-pay | Admitting: Adult Health

## 2021-02-28 ENCOUNTER — Non-Acute Institutional Stay (SKILLED_NURSING_FACILITY): Payer: Medicare Other | Admitting: Adult Health

## 2021-02-28 DIAGNOSIS — J4520 Mild intermittent asthma, uncomplicated: Secondary | ICD-10-CM | POA: Diagnosis not present

## 2021-02-28 DIAGNOSIS — F0150 Vascular dementia without behavioral disturbance: Secondary | ICD-10-CM

## 2021-02-28 DIAGNOSIS — G903 Multi-system degeneration of the autonomic nervous system: Secondary | ICD-10-CM | POA: Diagnosis not present

## 2021-02-28 DIAGNOSIS — F251 Schizoaffective disorder, depressive type: Secondary | ICD-10-CM | POA: Diagnosis not present

## 2021-02-28 NOTE — Progress Notes (Signed)
Location:  [facility] Room Number: 225 B Place of Service:  SNF (31) Provider:  Kenard Gower, DNP, FNP-BC  Patient Care Team: Pecola Lawless, MD as PCP - General (Internal Medicine) Center, [facility] (Skilled Nursing Facility) Synetta Shadow as Physician Assistant (Internal Medicine)  Extended Emergency Contact Information Primary Emergency Contact: [name] of [place] Home Phone: [phone] Relation: Daughter Secondary Emergency Contact: [name] [place] of [place] Home Phone: [phone] Relation: Daughter  Code Status:  DNR  Goals of care: Advanced Directive information Advanced Directives 01/29/2021  Does Patient Have a Medical Advance Directive? Yes  Type of Advance Directive -  Does patient want to make changes to medical advance directive? No - Patient declined  Would patient like information on creating a medical advance directive? -  Pre-existing out of facility DNR order (yellow form or pink MOST form) Pink MOST form placed in chart (order not valid for inpatient use)     Chief Complaint  Patient presents with  . Medical Management of Chronic Issues    Routine Visit    HPI:  Pt is a 75 y.o. female seen today for medical management of chronic diseases. She has a PMH of TIA, stroke, dyslipidemia and asthma. She is a long-term care resident of [facility]. SBPs ranging from 95 to 125. She takes midodrine 2.5 mg twice a day PRN for SBP <95. No episode of wheezing nor SOB. She has PRN Albuterol INH for wheezing/SOB.   Past Medical History:  Diagnosis Date  . Allergy   . Anxiety   . Arthritis   . Asthma   . Benign essential hypertension 03/18/2017  . Cataract   . Dementia (HCC)   . Dementia with Lewy bodies (CODE) (HCC)   . Depression   . Difficulty walking   . Dysphagia, oral phase   . Fibromyalgia   . GERD (gastroesophageal reflux disease)   .  Hallucinations   . Hyperlipidemia   . Lower extremity edema   . Repeated falls   . Rhabdomyolysis   . Stroke (HCC)   . TIA (transient ischemic attack)   . Urinary incontinence    Past Surgical History:  Procedure Laterality Date  . ABDOMINAL HYSTERECTOMY    . CATARACT EXTRACTION    . CHOLECYSTECTOMY    . TONSILLECTOMY AND ADENOIDECTOMY      Allergies  Allergen Reactions  . Hydrocodone Other (See Comments)    GI upset    Outpatient Encounter Medications as of 02/28/2021  Medication Sig  . acetaminophen (TYLENOL) 325 MG tablet Take 650 mg by mouth See admin instructions. 650 BID PRN and 650 BID scheduled  . albuterol (PROVENTIL HFA;VENTOLIN HFA) 108 (90 Base) MCG/ACT inhaler Inhale 2 puffs into the lungs every 6 (six) hours as needed for wheezing or shortness of breath.  Marland Kitchen aspirin EC 81 MG tablet Take 81 mg by mouth daily.  . bisacodyl (DULCOLAX) 10 MG suppository Place 10 mg rectally as needed for moderate constipation.  . busPIRone (BUSPAR) 15 MG tablet Take 15 mg by mouth 3 (three) times daily.  . carbidopa-levodopa (SINEMET IR) 25-100 MG tablet Take 1 tablet by mouth 3 (three) times daily.  . cholecalciferol (VITAMIN D) 1000 units tablet Take 1,000 Units by mouth daily.   Marland Kitchen donepezil (ARICEPT) 10 MG tablet Take 10 mg by mouth at bedtime.   . DULoxetine (CYMBALTA) 30 MG capsule Give 3 tablets (90mg ) by mouth daily  . fluticasone (FLONASE) 50  MCG/ACT nasal spray Place 2 sprays into both nostrils daily.   . Lidocaine (SALONPAS PAIN RELIEVING) 4 % PTCH Apply topically. APPLY 2 PATCHES TOPICALLY TO LOWER BACK ONCE DAILY FOR PAIN (REMOVE AT BEDTIME)  . Lidocaine 4 % PTCH APPLY 2 PATCHES TOPICALLY TO LOWER BACK ONCE DAILY FOR PAIN (REMOVE AT BEDTIME)  . LITHIUM CARBONATE PO Take 450 mg by mouth at bedtime.  Marland Kitchen LORazepam (ATIVAN) 1 MG tablet Take 1 tablet (1 mg total) by mouth every 12 (twelve) hours.  . magnesium hydroxide (MILK OF MAGNESIA) 400 MG/5ML suspension If no BM in 3 days,  give 30 cc Milk of Magnesium p.o. x 1 dose in 24 hours as needed (Do not use standing constipation orders for residents with renal failure CFR less than 30. Contact MD for orders) (Physician Order)  . memantine (NAMENDA) 10 MG tablet Take 10 mg by mouth 2 (two) times daily.   . Menthol, Topical Analgesic, (BIOFREEZE) 4 % GEL Apply 1 application topically in the morning, at noon, in the evening, and at bedtime. Apply to bilateral lower legs for pain  . midodrine (PROAMATINE) 2.5 MG tablet Take 1 tablet (2.5 mg total) by mouth 2 (two) times daily as needed. For SBP <=95  . NON FORMULARY Regular /thin diet consistency  . Nutritional Supplement LIQD Take 120 mLs by mouth 3 (three) times daily. MedPass  . Nutritional Supplements (NUTRITIONAL SUPPLEMENT PO) Take 1 each by mouth in the morning and at bedtime. Magic Cup  . pantoprazole (PROTONIX) 40 MG tablet Take 40 mg by mouth daily.   . polyethylene glycol (MIRALAX / GLYCOLAX) packet Take 17 g by mouth daily as needed.   . sodium fluoride (PREVIDENT 5000 PLUS) 1.1 % CREA dental cream Place 1 application onto teeth at bedtime.   . Sodium Phosphates (RA SALINE ENEMA RE) If not relieved by Biscodyl suppository, give disposable Saline Enema rectally X 1 dose/24 hrs as needed (Do not use constipation standing orders for residents with renal failure/CFR less than 30. Contact MD for orders)(Physician Or  . ziprasidone (GEODON) 80 MG capsule Take 80 mg by mouth 2 (two) times daily.    No facility-administered encounter medications on file as of 02/28/2021.    Review of Systems  GENERAL: No change in appetite, no fatigue, no weight changes, no fever or chills  MOUTH and THROAT: Denies oral discomfort, gingival pain or bleeding RESPIRATORY: no cough, SOB, DOE, wheezing, hemoptysis CARDIAC: No chest pain, edema or palpitations GI: No abdominal pain, diarrhea, constipation, heart burn, nausea or vomiting GU: Denies dysuria, frequency, hematuria, incontinence,  or discharge NEUROLOGICAL: Denies dizziness, syncope, numbness, or headache PSYCHIATRIC: Denies feelings of depression or anxiety. No report of hallucinations, insomnia, paranoia, or agitation   Immunization History  Administered Date(s) Administered  . Influenza-Unspecified 03/17/2017, 08/17/2018, 08/29/2019, 08/28/2020  . Moderna Sars-Covid-2 Vaccination 01/02/2020, 01/30/2020  . PPD Test 03/24/2017, 06/02/2018, 06/08/2018  . Pneumococcal Polysaccharide-23 03/17/2017, 06/12/2018   Pertinent  Health Maintenance Due  Topic Date Due  . DEXA SCAN  Never done  . PNA vac Low Risk Adult (2 of 2 - PCV13) 06/13/2019  . INFLUENZA VACCINE  06/17/2021   Fall Risk  05/27/2018 05/14/2017  Falls in the past year? No Yes  Number falls in past yr: - 2 or more  Injury with Fall? - No     Vitals:   02/28/21 1000  BP: (!) 100/56  Pulse: 68  Resp: 16  Temp: (!) 97.5 F (36.4 C)  Weight: 133 lb 9.6  oz (60.6 kg)  Height: 5\' 5"  (1.651 m)   Body mass index is 22.23 kg/m.  Physical Exam  GENERAL APPEARANCE: Well nourished. In no acute distress. Normal body habitus SKIN:  Skin is warm and dry.  MOUTH and THROAT: Lips are without lesions. Oral mucosa is moist and without lesions.  RESPIRATORY: Breathing is even & unlabored, BS CTAB CARDIAC: RRR, no murmur,no extra heart sounds, no edema GI: Abdomen soft, normal BS, no masses, no tenderness NEUROLOGICAL: There is no tremor. Speech is clear. Alert to self, disoriented to time and place. PSYCHIATRIC:  Affect and behavior are appropriate  Labs reviewed: Recent Labs    04/03/20 0000 01/16/21 0000  NA 143 140  K 3.6 3.9  CL 105 104  CO2 26* 27*  BUN 15 15  CREATININE 0.5 0.6  CALCIUM 9.6 9.5   Recent Labs    01/16/21 0000  AST 14  ALT 9  ALKPHOS 63  ALBUMIN 4.2   Recent Labs    04/03/20 0000 01/16/21 0000  WBC 5.5 4.7  NEUTROABS 3  --   HGB 12.2 12.7  HCT 34* 37  PLT 262 250   Lab Results  Component Value Date   TSH  4.07 01/30/2021   Lab Results  Component Value Date   HGBA1C 5.4 03/15/2018   Lab Results  Component Value Date   CHOL 139 03/15/2018   HDL 46 03/15/2018   LDLCALC 74 03/15/2018   TRIG 97 03/15/2018     Assessment/Plan  1. Neurogenic orthostatic hypotension (HCC) -   Has not used PRN Midodrine for this month -  Continue to monitor BP and PRN Midodrine  2. Mild intermittent asthma with allergic rhinitis without complication -Stable, continue PRN albuterol INH  3. Schizoaffective disorder, depressive type (HCC) -No reported hallucinations -Continue duloxetine, lithium carbonate, ziprasidone and buspirone  4. Vascular dementia without behavioral disturbance (HCC) -   BIMS score 9/15, ranging in moderate cognitive impairment -    continue and donepezil -    Continue supportive care   Family/ staff Communication:   Discussed plan of care with resident and charge nurse.  Labs/tests ordered:   None  Goals of care:    Long-term care   10/15, DNP, MSN, FNP-BC [facility] and Adult Medicine [phone] (Monday-Friday 8:00 a.m. - 5:00 p.m.) [phone] (after hours)

## 2021-03-06 DIAGNOSIS — F329 Major depressive disorder, single episode, unspecified: Secondary | ICD-10-CM | POA: Diagnosis not present

## 2021-03-06 DIAGNOSIS — F251 Schizoaffective disorder, depressive type: Secondary | ICD-10-CM | POA: Diagnosis not present

## 2021-03-06 DIAGNOSIS — F419 Anxiety disorder, unspecified: Secondary | ICD-10-CM | POA: Diagnosis not present

## 2021-03-06 DIAGNOSIS — G3183 Dementia with Lewy bodies: Secondary | ICD-10-CM | POA: Diagnosis not present

## 2021-03-06 DIAGNOSIS — F29 Unspecified psychosis not due to a substance or known physiological condition: Secondary | ICD-10-CM | POA: Diagnosis not present

## 2021-03-12 ENCOUNTER — Other Ambulatory Visit: Payer: Self-pay | Admitting: Adult Health

## 2021-03-12 DIAGNOSIS — F419 Anxiety disorder, unspecified: Secondary | ICD-10-CM

## 2021-03-12 MED ORDER — LORAZEPAM 1 MG PO TABS: 1.0000 mg | ORAL_TABLET | Freq: Two times a day (BID) | ORAL | 0 refills | Status: DC

## 2021-03-19 ENCOUNTER — Non-Acute Institutional Stay: Payer: Medicare Other | Admitting: Hospice

## 2021-03-19 ENCOUNTER — Other Ambulatory Visit: Payer: Self-pay

## 2021-03-19 DIAGNOSIS — Z515 Encounter for palliative care: Secondary | ICD-10-CM | POA: Diagnosis not present

## 2021-03-19 DIAGNOSIS — R1311 Dysphagia, oral phase: Secondary | ICD-10-CM | POA: Diagnosis not present

## 2021-03-19 DIAGNOSIS — R531 Weakness: Secondary | ICD-10-CM

## 2021-03-19 NOTE — Progress Notes (Signed)
Therapist, nutritional Palliative Care Consult Note Telephone: [phone]  Fax: [phone]  PATIENT NAME: Jennifer Garner DOB: 23-Apr-1946 MRN: [id number]  PRIMARY CARE PROVIDER:   Pecola Lawless, MD Pecola Lawless, MD [address] [place],  [place] [address]  REFERRING PROVIDER: Pecola Lawless, MD Pecola Lawless, MD [address] [place],  [place] [address]   RESPONSIBLE PARTY:Mindy Reed Pandy (Daughter)  Phone Number [phone] Contact Information    Name Relation Home Work Mobile   [name],[name] Daughter [phone]     Lisette Abu Daughter [phone]     ELYSIA, GRAND   [phone]      Visit is to build trust and highlight Palliative Medicine as specialized medical care for people living with serious illness, aimed at facilitating better quality of life through symptoms relief, assisting with advance care planning and complex medical decision making.   RECOMMENDATIONS/PLAN:   Advance Care Planning/Code Status: Patient is a DO NOT RESUSCITATE  Goals of Care: Goals of care include to maximize quality of life and symptom management.  Family is interested in hospice service in the future when patient qualifies for it.  Visit consisted of counseling and education dealing with the complex and emotionally intense issues of symptom management and palliative care in the setting of serious and potentially life-threatening illness. Palliative care team will continue to support patient, patient's family, and medical team.  CHIEF COMPLAINT: Palliative follow up visit/weakness  Symptom management/Plan:  Generalized body weakness: Improving.  Patient is now able to sit up and tolerate transfers.  Continue facility restorative care activity as tolerated. Get patient out of bed to wheelchair daily. Continue Med pass, regular diet. Assist with feeding to ensure adequate caloric intake.  Patient had 5% weight increase in the  last month current weight at 130.2 pounds. Dysphagia: Aspiration precautions.  Mechanical soft diet. Anxiety: Calm approach, de-escalation techniques, buspirone as ordered. Palliative will continue to monitor for symptom management/decline and make recommendations as needed. Return2 months or prn. Encouraged to call provider sooner with any concerns.   HISTORY OF PRESENT ILLNESS:   Follow-up visit for Jennifer Garner, a 75 y.o. female with multiple medical problems including weakness which is chronic related to progressing  Dementia - FAST 7c, Parkinson disease, stroke. Patient is mostly in bed, total care with reduced quality of life.   Weakness has improved as patient continues with facility restorative care-now able to transfer to chair and sit up to watch TV.  History obtained from review of EMR, primary nursing staff, discussion with patient/family.  Review and summarization of Epic records shows history from other than patient. Rest of 10 point ROS asked and negative.  Palliative Care was asked to follow this patient by consultation request of Pecola Lawless, MD to help address complex decision making in the context of advance care planning and goals of care clarification.   Review and summarization of Epic records shows history from other than patient.   Palliative Care was asked to follow this patient by consultation request of Pecola Lawless, MD to help address complex decision making in the context of advance care planning and goals of care clarification.  PHYSICAL EXAM  General: In no acute distress, appropriately dressed Cardiovascular: regular rate and rhythm; no edema in BLE Pulmonary: no cough, no increased work of breathing, normal respiratory effort Abdomen: soft, non tender, no guarding, positive bowel sounds in all quadrants GU:  no suprapubic tenderness Eyes: Normal lids, no discharge, sclera anicteric ENMT: Moist  mucous membranes Musculoskeletal:  weakness Skin:  no rash to visible skin, warm without cyanosis,  Psych: non-anxious affect Neurological: Weakness but otherwise non focal Heme/lymph/immuno: no bruises, no bleeding  PERTINENT MEDICATIONS:  Outpatient Encounter Medications as of 03/19/2021  Medication Sig  . acetaminophen (TYLENOL) 325 MG tablet Take 650 mg by mouth See admin instructions. 650 BID PRN and 650 BID scheduled  . albuterol (PROVENTIL HFA;VENTOLIN HFA) 108 (90 Base) MCG/ACT inhaler Inhale 2 puffs into the lungs every 6 (six) hours as needed for wheezing or shortness of breath.  Marland Kitchen aspirin EC 81 MG tablet Take 81 mg by mouth daily.  . bisacodyl (DULCOLAX) 10 MG suppository Place 10 mg rectally as needed for moderate constipation.  . busPIRone (BUSPAR) 15 MG tablet Take 15 mg by mouth 3 (three) times daily.  . carbidopa-levodopa (SINEMET IR) 25-100 MG tablet Take 1 tablet by mouth 3 (three) times daily.  . cholecalciferol (VITAMIN D) 1000 units tablet Take 1,000 Units by mouth daily.   Marland Kitchen donepezil (ARICEPT) 10 MG tablet Take 10 mg by mouth at bedtime.   . DULoxetine (CYMBALTA) 30 MG capsule Give 3 tablets (90mg ) by mouth daily  . fluticasone (FLONASE) 50 MCG/ACT nasal spray Place 2 sprays into both nostrils daily.   . Lidocaine (SALONPAS PAIN RELIEVING) 4 % PTCH Apply topically. APPLY 2 PATCHES TOPICALLY TO LOWER BACK ONCE DAILY FOR PAIN (REMOVE AT BEDTIME)  . Lidocaine 4 % PTCH APPLY 2 PATCHES TOPICALLY TO LOWER BACK ONCE DAILY FOR PAIN (REMOVE AT BEDTIME)  . LITHIUM CARBONATE PO Take 450 mg by mouth at bedtime.  LORazepam (ATIVAN) 1 MG tablet Take 1 tablet (1 mg total) by mouth every 12 (twelve) hours.  . magnesium hydroxide (MILK OF MAGNESIA) 400 MG/5ML suspension If no BM in 3 days, give 30 cc Milk of Magnesium p.o. x 1 dose in 24 hours as needed (Do not use standing constipation orders for residents with renal failure CFR less than 30. Contact MD for orders) (Physician Order)  . memantine (NAMENDA) 10 MG tablet Take 10 mg by  mouth 2 (two) times daily.   . Menthol, Topical Analgesic, (BIOFREEZE) 4 % GEL Apply 1 application topically in the morning, at noon, in the evening, and at bedtime. Apply to bilateral lower legs for pain  . midodrine (PROAMATINE) 2.5 MG tablet Take 1 tablet (2.5 mg total) by mouth 2 (two) times daily as needed. For SBP <=95  . NON FORMULARY Regular /thin diet consistency  . Nutritional Supplement LIQD Take 120 mLs by mouth 3 (three) times daily. MedPass  . Nutritional Supplements (NUTRITIONAL SUPPLEMENT PO) Take 1 each by mouth in the morning and at bedtime. Magic Cup  . pantoprazole (PROTONIX) 40 MG tablet Take 40 mg by mouth daily.   . polyethylene glycol (MIRALAX / GLYCOLAX) packet Take 17 g by mouth daily as needed.   . sodium fluoride (PREVIDENT 5000 PLUS) 1.1 % CREA dental cream Place 1 application onto teeth at bedtime.   . Sodium Phosphates (RA SALINE ENEMA RE) If not relieved by Biscodyl suppository, give disposable Saline Enema rectally X 1 dose/24 hrs as needed (Do not use constipation standing orders for residents with renal failure/CFR less than 30. Contact MD for orders)(Physician Or  . ziprasidone (GEODON) 80 MG capsule Take 80 mg by mouth 2 (two) times daily.    No facility-administered encounter medications on file as of 03/19/2021.    HOSPICE ELIGIBILITY/DIAGNOSIS: TBD  PAST MEDICAL HISTORY:  Past Medical History:  Diagnosis Date  .  Allergy   . Anxiety   . Arthritis   . Asthma   . Benign essential hypertension 03/18/2017  . Cataract   . Dementia (HCC)   . Dementia with Lewy bodies (CODE) (HCC)   . Depression   . Difficulty walking   . Dysphagia, oral phase   . Fibromyalgia   . GERD (gastroesophageal reflux disease)   . Hallucinations   . Hyperlipidemia   . Lower extremity edema   . Repeated falls   . Rhabdomyolysis   . Stroke (HCC)   . TIA (transient ischemic attack)   . Urinary incontinence      SOCIAL HX: @SOCX  Patient lives at   for ongoing care  FAMILY  HX:  Family History  Problem Relation Age of Onset  . Non-Hodgkin's lymphoma Mother   . Stroke Mother   . Heart attack Father     Review lab tests/diagnostics No results for input(s): WBC, HGB, HCT, PLT, MCV in the last 168 hours. No results for input(s): NA, K, CL, CO2, BUN, CREATININE, GLUCOSE in the last 168 hours. CrCl cannot be calculated (Patient's most recent lab result is older than the maximum 21 days allowed.).  ALLERGIES:  Allergies  Allergen Reactions  . Hydrocodone Other (See Comments)    GI upset      I spent 50 minutes providing this consultation; this includes time spent with patient/family, chart review and documentation. More than 50% of the time in this consultation was spent on counseling and coordinating communication   Thank you for the opportunity to participate in the care of Jennifer Garner Please call our office at [phone] if we can be of additional assistance.  Note: Portions of this note were generated with [phone]. Dictation errors may occur despite best attempts at proofreading.  Scientist, clinical (histocompatibility and immunogenetics), NP

## 2021-03-27 DIAGNOSIS — R1312 Dysphagia, oropharyngeal phase: Secondary | ICD-10-CM | POA: Diagnosis not present

## 2021-04-04 ENCOUNTER — Non-Acute Institutional Stay (SKILLED_NURSING_FACILITY): Payer: Medicare Other | Admitting: Adult Health

## 2021-04-04 ENCOUNTER — Encounter: Payer: Self-pay | Admitting: Adult Health

## 2021-04-04 DIAGNOSIS — G20 Parkinson's disease: Secondary | ICD-10-CM

## 2021-04-04 DIAGNOSIS — F419 Anxiety disorder, unspecified: Secondary | ICD-10-CM | POA: Diagnosis not present

## 2021-04-04 DIAGNOSIS — F251 Schizoaffective disorder, depressive type: Secondary | ICD-10-CM | POA: Diagnosis not present

## 2021-04-04 DIAGNOSIS — J309 Allergic rhinitis, unspecified: Secondary | ICD-10-CM | POA: Diagnosis not present

## 2021-04-04 DIAGNOSIS — K219 Gastro-esophageal reflux disease without esophagitis: Secondary | ICD-10-CM | POA: Diagnosis not present

## 2021-04-04 DIAGNOSIS — G903 Multi-system degeneration of the autonomic nervous system: Secondary | ICD-10-CM

## 2021-04-04 NOTE — Progress Notes (Signed)
Location:  [facility] Room Number: 225-B Place of Service:  SNF (31) Provider:  Kenard Gower, DNP, FNP-BC  Patient Care Team: Pecola Lawless, MD as PCP - General (Internal Medicine) [facility] (Skilled Nursing Facility) Synetta Shadow as Physician Assistant (Internal Medicine)  Extended Emergency Contact Information Primary Emergency Contact: [name] of [place] Home Phone: [phone] Relation: Daughter Secondary Emergency Contact: [name] [place] of [place] Home Phone: [phone] Relation: Daughter  Code Status:    Goals of care: Advanced Directive information Advanced Directives 04/04/2021  Does Patient Have a Medical Advance Directive? Yes  Type of Estate agent of [place];Out of facility DNR (pink MOST or yellow form)  Does patient want to make changes to medical advance directive? No - Patient declined  Copy of Healthcare Power of Attorney in Chart? Yes - validated most recent copy scanned in chart (See row information)  Would patient like information on creating a medical advance directive? -  Pre-existing out of facility DNR order (yellow form or pink MOST form) -     Chief Complaint  Patient presents with  . Medical Management of Chronic Issues    Routine Visit.     HPI:  Pt is a 75 y.o. female seen today for medical management of chronic diseases. She is a long-term care resident of [facility]. She takes Carbidopa-Levodopa 25-100 TID for Parkinson's disease. She takes Fluticasone 50 mcg 2 sprays in each nostril daily for allergic rhinitis. She takes Pantoprazole 40 mg daily for GERD. She denies having episodes of acid reflux.   Past Medical History:  Diagnosis Date  . Allergy   . Anxiety   . Arthritis   . Asthma   . Benign essential hypertension 03/18/2017  . Cataract   . Dementia (HCC)   . Dementia with Lewy bodies (CODE)  (HCC)   . Depression   . Difficulty walking   . Dysphagia, oral phase   . Fibromyalgia   . GERD (gastroesophageal reflux disease)   . Hallucinations   . Hyperlipidemia   . Lower extremity edema   . Repeated falls   . Rhabdomyolysis   . Stroke (HCC)   . TIA (transient ischemic attack)   . Urinary incontinence    Past Surgical History:  Procedure Laterality Date  . ABDOMINAL HYSTERECTOMY    . CATARACT EXTRACTION    . CHOLECYSTECTOMY    . TONSILLECTOMY AND ADENOIDECTOMY      Allergies  Allergen Reactions  . Hydrocodone Other (See Comments)    GI upset    Outpatient Encounter Medications as of 04/04/2021  Medication Sig  . acetaminophen (TYLENOL) 325 MG tablet Take 650 mg by mouth See admin instructions. 650 BID PRN and 650 BID scheduled  . albuterol (PROVENTIL HFA;VENTOLIN HFA) 108 (90 Base) MCG/ACT inhaler Inhale 2 puffs into the lungs every 6 (six) hours as needed for wheezing or shortness of breath.  Marland Kitchen aspirin EC 81 MG tablet Take 81 mg by mouth daily.  . bisacodyl (DULCOLAX) 10 MG suppository Place 10 mg rectally as needed for moderate constipation.  . busPIRone (BUSPAR) 15 MG tablet Take 15 mg by mouth 3 (three) times daily.  . carbidopa-levodopa (SINEMET IR) 25-100 MG tablet Take 1 tablet by mouth 3 (three) times daily.  . cholecalciferol (VITAMIN D) 1000 units tablet Take 1,000 Units by mouth daily.   Marland Kitchen donepezil (ARICEPT) 10 MG tablet Take 10 mg by mouth at bedtime.   Marland Kitchen  DULoxetine (CYMBALTA) 30 MG capsule Give 3 tablets (90mg ) by mouth daily  . fluticasone (FLONASE) 50 MCG/ACT nasal spray Place 2 sprays into both nostrils daily.   . Lidocaine 4 % PTCH Apply topically. APPLY 2 PATCHES TOPICALLY TO LOWER BACK ONCE DAILY FOR PAIN (REMOVE AT BEDTIME)  . LITHIUM CARBONATE PO Take 450 mg by mouth at bedtime.  . magnesium hydroxide (MILK OF MAGNESIA) 400 MG/5ML suspension If no BM in 3 days, give 30 cc Milk of Magnesium p.o. x 1 dose in 24 hours as needed (Do not use standing  constipation orders for residents with renal failure CFR less than 30. Contact MD for orders) (Physician Order)  . memantine (NAMENDA) 10 MG tablet Take 10 mg by mouth 2 (two) times daily.   . Menthol, Topical Analgesic, (BIOFREEZE) 4 % GEL Apply 1 application topically in the morning, at noon, in the evening, and at bedtime. Apply to bilateral lower legs for pain  . midodrine (PROAMATINE) 2.5 MG tablet Take 1 tablet (2.5 mg total) by mouth 2 (two) times daily as needed. For SBP <=95  . Nutritional Supplement LIQD Take 120 mLs by mouth 3 (three) times daily. MedPass  . pantoprazole (PROTONIX) 40 MG tablet Take 40 mg by mouth daily.   . polyethylene glycol (MIRALAX / GLYCOLAX) packet Take 17 g by mouth daily as needed.   . sodium fluoride (PREVIDENT 5000 PLUS) 1.1 % CREA dental cream Place 1 application onto teeth at bedtime.   . Sodium Phosphates (RA SALINE ENEMA RE) If not relieved by Biscodyl suppository, give disposable Saline Enema rectally X 1 dose/24 hrs as needed (Do not use constipation standing orders for residents with renal failure/CFR less than 30. Contact MD for orders)(Physician Or  . ziprasidone (GEODON) 80 MG capsule Take 80 mg by mouth 2 (two) times daily.   . [DISCONTINUED] LORazepam (ATIVAN) 1 MG tablet Take 1 tablet (1 mg total) by mouth every 12 (twelve) hours.  . [DISCONTINUED] Lidocaine 4 % PTCH APPLY 2 PATCHES TOPICALLY TO LOWER BACK ONCE DAILY FOR PAIN (REMOVE AT BEDTIME)  . [DISCONTINUED] NON FORMULARY Regular /thin diet consistency  . [DISCONTINUED] Nutritional Supplements (NUTRITIONAL SUPPLEMENT PO) Take 1 each by mouth in the morning and at bedtime. Magic Cup   No facility-administered encounter medications on file as of 04/04/2021.    Review of Systems  GENERAL: No fever or chills  MOUTH and THROAT: Denies oral discomfort, gingival pain or bleeding RESPIRATORY: no cough, SOB, DOE, wheezing, hemoptysis CARDIAC: No chest pain, edema or palpitations GI: No abdominal  pain, diarrhea, constipation, heart burn, nausea or vomiting GU: Denies dysuria, frequency, hematuria or discharge NEUROLOGICAL: Denies dizziness, syncope, numbness, or headache PSYCHIATRIC:  No report of hallucinations, insomnia, paranoia, or agitation   Immunization History  Administered Date(s) Administered  . Influenza-Unspecified 03/17/2017, 08/17/2018, 08/29/2019, 08/28/2020  . Moderna Sars-Covid-2 Vaccination 01/02/2020, 01/30/2020  . PPD Test 03/24/2017, 06/02/2018, 06/08/2018  . Pneumococcal Polysaccharide-23 03/17/2017, 06/12/2018   Pertinent  Health Maintenance Due  Topic Date Due  . DEXA SCAN  Never done  . PNA vac Low Risk Adult (2 of 2 - PCV13) 04/15/2022 (Originally 06/13/2019)  . INFLUENZA VACCINE  06/17/2021   Fall Risk  05/27/2018 05/14/2017  Falls in the past year? No Yes  Number falls in past yr: - 2 or more  Injury with Fall? - No     Vitals:   04/04/21 1434  BP: 100/77  Pulse: 69  Resp: 17  Temp: (!) 97.5 F (36.4 C)  Weight: 130 lb 3.2 oz (59.1 kg)  Height: 5\' 5"  (1.651 m)   Body mass index is 21.67 kg/m.  Physical Exam  GENERAL APPEARANCE: Well nourished. In no acute distress. Normal body habitus SKIN:  Skin is warm and dry.  MOUTH and THROAT: Lips are without lesions. Oral mucosa is moist and without lesions.  RESPIRATORY: Breathing is even & unlabored, BS CTAB CARDIAC: RRR, no murmur,no extra heart sounds, no edema GI: Abdomen soft, normal BS, no masses, no tenderness NEUROLOGICAL: +tremor. Speech is clear. Alert to self, disoriented to time and place. PSYCHIATRIC:  Affect is blunt and behavior are appropriate  Labs reviewed: Recent Labs    01/16/21 0000  NA 140  K 3.9  CL 104  CO2 27*  BUN 15  CREATININE 0.6  CALCIUM 9.5   Recent Labs    01/16/21 0000  AST 14  ALT 9  ALKPHOS 63  ALBUMIN 4.2   Recent Labs    01/16/21 0000  WBC 4.7  HGB 12.7  HCT 37  PLT 250   Lab Results  Component Value Date   TSH 4.07 01/30/2021    Lab Results  Component Value Date   HGBA1C 5.4 03/15/2018   Lab Results  Component Value Date   CHOL 139 03/15/2018   HDL 46 03/15/2018   LDLCALC 74 03/15/2018   TRIG 97 03/15/2018     Assessment/Plan  1. Parkinson disease (HCC) -  Continue Carvidopa-Levodopa  2. GERD without esophagitis -  Stable, continue Pantoprazole  3. Schizoaffective disorder, depressive type (HCC) -  Mood is stable, continue Duloxetine, Lithium carbonate and Buspirone -  Followed by psych NP  4. Anxiety -  Stable, continue Ativan  5. Allergic rhinitis, unspecified seasonality, unspecified trigger - continue Fluticasone nasal spray   Family/ staff Communication:  Discussed plan of care with resident and charge nurse.  Labs/tests ordered:  None  Goals of care:   Long-term care   03/17/2018, DNP, MSN, FNP-BC [facility] [phone] (Monday-Friday 8:00 a.m. - 5:00 p.m.) [phone] (after hours)

## 2021-04-10 DIAGNOSIS — R1312 Dysphagia, oropharyngeal phase: Secondary | ICD-10-CM | POA: Diagnosis not present

## 2021-04-11 ENCOUNTER — Other Ambulatory Visit: Payer: Self-pay | Admitting: Adult Health

## 2021-04-11 DIAGNOSIS — R1312 Dysphagia, oropharyngeal phase: Secondary | ICD-10-CM | POA: Diagnosis not present

## 2021-04-11 DIAGNOSIS — F419 Anxiety disorder, unspecified: Secondary | ICD-10-CM

## 2021-04-11 MED ORDER — LORAZEPAM 1 MG PO TABS
1.0000 mg | ORAL_TABLET | Freq: Two times a day (BID) | ORAL | 0 refills | Status: DC
Start: 2021-04-11 — End: 2021-05-08

## 2021-04-12 DIAGNOSIS — R1312 Dysphagia, oropharyngeal phase: Secondary | ICD-10-CM | POA: Diagnosis not present

## 2021-04-13 DIAGNOSIS — R1312 Dysphagia, oropharyngeal phase: Secondary | ICD-10-CM | POA: Diagnosis not present

## 2021-04-16 DIAGNOSIS — R1312 Dysphagia, oropharyngeal phase: Secondary | ICD-10-CM | POA: Diagnosis not present

## 2021-04-17 DIAGNOSIS — R1312 Dysphagia, oropharyngeal phase: Secondary | ICD-10-CM | POA: Diagnosis not present

## 2021-04-18 DIAGNOSIS — R1312 Dysphagia, oropharyngeal phase: Secondary | ICD-10-CM | POA: Diagnosis not present

## 2021-04-19 DIAGNOSIS — R1312 Dysphagia, oropharyngeal phase: Secondary | ICD-10-CM | POA: Diagnosis not present

## 2021-04-22 DIAGNOSIS — R1312 Dysphagia, oropharyngeal phase: Secondary | ICD-10-CM | POA: Diagnosis not present

## 2021-04-28 DIAGNOSIS — G3183 Dementia with Lewy bodies: Secondary | ICD-10-CM | POA: Diagnosis not present

## 2021-04-28 DIAGNOSIS — F329 Major depressive disorder, single episode, unspecified: Secondary | ICD-10-CM | POA: Diagnosis not present

## 2021-04-28 DIAGNOSIS — F29 Unspecified psychosis not due to a substance or known physiological condition: Secondary | ICD-10-CM | POA: Diagnosis not present

## 2021-04-28 DIAGNOSIS — F419 Anxiety disorder, unspecified: Secondary | ICD-10-CM | POA: Diagnosis not present

## 2021-04-28 DIAGNOSIS — F251 Schizoaffective disorder, depressive type: Secondary | ICD-10-CM | POA: Diagnosis not present

## 2021-05-08 ENCOUNTER — Other Ambulatory Visit: Payer: Self-pay | Admitting: Adult Health

## 2021-05-08 DIAGNOSIS — F419 Anxiety disorder, unspecified: Secondary | ICD-10-CM

## 2021-05-08 MED ORDER — LORAZEPAM 1 MG PO TABS
1.0000 mg | ORAL_TABLET | Freq: Two times a day (BID) | ORAL | 0 refills | Status: DC
Start: 2021-05-08 — End: 2021-06-05

## 2021-05-09 DIAGNOSIS — I10 Essential (primary) hypertension: Secondary | ICD-10-CM | POA: Diagnosis not present

## 2021-05-24 ENCOUNTER — Encounter: Payer: Self-pay | Admitting: Adult Health

## 2021-05-24 ENCOUNTER — Non-Acute Institutional Stay (SKILLED_NURSING_FACILITY): Payer: Medicare Other | Admitting: Adult Health

## 2021-05-24 DIAGNOSIS — G20 Parkinson's disease: Secondary | ICD-10-CM | POA: Diagnosis not present

## 2021-05-24 DIAGNOSIS — F0150 Vascular dementia without behavioral disturbance: Secondary | ICD-10-CM | POA: Diagnosis not present

## 2021-05-24 DIAGNOSIS — F251 Schizoaffective disorder, depressive type: Secondary | ICD-10-CM | POA: Diagnosis not present

## 2021-05-24 DIAGNOSIS — D649 Anemia, unspecified: Secondary | ICD-10-CM | POA: Diagnosis not present

## 2021-05-24 DIAGNOSIS — R627 Adult failure to thrive: Secondary | ICD-10-CM

## 2021-05-24 DIAGNOSIS — N390 Urinary tract infection, site not specified: Secondary | ICD-10-CM | POA: Diagnosis not present

## 2021-05-24 LAB — COMPREHENSIVE METABOLIC PANEL
Calcium: 9.6 (ref 8.7–10.7)
GFR calc non Af Amer: 88.93

## 2021-05-24 LAB — CBC AND DIFFERENTIAL
HCT: 36 (ref 36–46)
Hemoglobin: 12.7 (ref 12.0–16.0)
Platelets: 239 (ref 150–399)
WBC: 4.8

## 2021-05-24 LAB — BASIC METABOLIC PANEL
BUN: 15 (ref 4–21)
CO2: 24 — AB (ref 13–22)
Chloride: 104 (ref 99–108)
Creatinine: 0.6 (ref <=1.1)
Glucose: 85
Potassium: 4.3 (ref 3.4–5.3)
Sodium: 139 (ref 137–147)

## 2021-05-24 LAB — CBC: RBC: 3.69 — AB (ref 3.87–5.11)

## 2021-05-24 NOTE — Progress Notes (Signed)
Location:  [facility] Room Number: 225-B Place of Service:  SNF (31) Provider:  Kenard Gower, DNP, FNP-BC  Patient Care Team: Pecola Lawless, MD as PCP - General (Internal Medicine) Center, [facility] (Skilled Nursing Facility) Synetta Shadow as Physician Assistant (Internal Medicine)  Extended Emergency Contact Information Primary Emergency Contact: [name] of [place] Home Phone: [phone] Relation: Daughter Secondary Emergency Contact: [name] [place] of [place] Home Phone: [phone] Relation: Daughter  Code Status: DNR   Goals of care: Advanced Directive information Advanced Directives 05/24/2021  Does Patient Have a Medical Advance Directive? Yes  Type of Advance Directive Out of facility DNR (pink MOST or yellow form)  Does patient want to make changes to medical advance directive? No - Patient declined  Copy of Healthcare Power of Attorney in Chart? -  Would patient like information on creating a medical advance directive? -  Pre-existing out of facility DNR order (yellow form or pink MOST form) Pink MOST form placed in chart (order not valid for inpatient use)     Chief Complaint  Patient presents with   Acute Visit    Change from palliative to hospice care.    HPI:  Pt is a 75 y.o. female seen today for change of condition. Daughter requested for resident to have hospice consult. She was being followed by the palliative care. Discussed with palliative NP and agreed that resident is a candidate for hospice. Resident was seen in the room today. She is verbally responsive. She denies being in pain. She prefers to stay in bed. 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 Noted to have tremors on her right hand. SBPs ranging from 88 to 122.  She takes duloxetine 30 mg 3 capsules = 90 mg daily, lithium carbonate ER 450 mg at bedtime, Ativan 1 mg every 12  hours and ziprasidone 80 mg twice a day for schizoaffective disorder.   Past Medical History:  Diagnosis Date   Allergy    Anxiety    Arthritis    Asthma    Benign essential hypertension 03/18/2017   Cataract    Dementia (HCC)    Dementia with Lewy bodies (CODE) (HCC)    Depression    Difficulty walking    Dysphagia, oral phase    Fibromyalgia    GERD (gastroesophageal reflux disease)    Hallucinations    Hyperlipidemia    Lower extremity edema    Repeated falls    Rhabdomyolysis    Stroke (HCC)    TIA (transient ischemic attack)    Urinary incontinence    Past Surgical History:  Procedure Laterality Date   ABDOMINAL HYSTERECTOMY     CATARACT EXTRACTION     CHOLECYSTECTOMY     TONSILLECTOMY AND ADENOIDECTOMY      Allergies  Allergen Reactions   Hydrocodone Other (See Comments)    GI upset    Outpatient Encounter Medications as of 05/24/2021  Medication Sig   acetaminophen (TYLENOL) 325 MG tablet Take 650 mg by mouth See admin instructions. 650 BID PRN and 650 BID scheduled   albuterol (PROVENTIL HFA;VENTOLIN HFA) 108 (90 Base) MCG/ACT inhaler Inhale 2 puffs into the lungs every 6 (six) hours as needed for wheezing or shortness of breath.   aspirin EC 81 MG tablet Take 81 mg by mouth daily.   bisacodyl (DULCOLAX) 10 MG suppository Place 10 mg rectally as needed for moderate constipation.   busPIRone (BUSPAR) 15  MG tablet Take 15 mg by mouth 3 (three) times daily.   carbidopa-levodopa (SINEMET IR) 25-100 MG tablet Take 1 tablet by mouth 3 (three) times daily.   cholecalciferol (VITAMIN D) 1000 units tablet Take 1,000 Units by mouth daily.    donepezil (ARICEPT) 10 MG tablet Take 10 mg by mouth at bedtime.    DULoxetine (CYMBALTA) 30 MG capsule Give 3 tablets (90mg ) by mouth daily   fluticasone (FLONASE) 50 MCG/ACT nasal spray Place 2 sprays into both nostrils daily.    Lidocaine 4 % PTCH Apply topically. APPLY 2 PATCHES TOPICALLY TO LOWER BACK ONCE DAILY FOR PAIN  (REMOVE AT BEDTIME)   LITHIUM CARBONATE PO Take 450 mg by mouth at bedtime.   LORazepam (ATIVAN) 1 MG tablet Take 1 tablet (1 mg total) by mouth every 12 (twelve) hours.   magnesium hydroxide (MILK OF MAGNESIA) 400 MG/5ML suspension If no BM in 3 days, give 30 cc Milk of Magnesium p.o. x 1 dose in 24 hours as needed (Do not use standing constipation orders for residents with renal failure CFR less than 30. Contact MD for orders) (Physician Order)   memantine (NAMENDA) 10 MG tablet Take 10 mg by mouth 2 (two) times daily.    Menthol, Topical Analgesic, (BIOFREEZE) 4 % GEL Apply 1 application topically in the morning, at noon, in the evening, and at bedtime. Apply to bilateral lower legs for pain   midodrine (PROAMATINE) 2.5 MG tablet Take 1 tablet (2.5 mg total) by mouth 2 (two) times daily as needed. For SBP <=95   Nutritional Supplement LIQD Take 120 mLs by mouth 3 (three) times daily. MedPass   pantoprazole (PROTONIX) 40 MG tablet Take 40 mg by mouth daily.    polyethylene glycol (MIRALAX / GLYCOLAX) packet Take 17 g by mouth daily as needed.    sodium fluoride (PREVIDENT 5000 PLUS) 1.1 % CREA dental cream Place 1 application onto teeth at bedtime.    Sodium Phosphates (RA SALINE ENEMA RE) If not relieved by Biscodyl suppository, give disposable Saline Enema rectally X 1 dose/24 hrs as needed (Do not use constipation standing orders for residents with renal failure/CFR less than 30. Contact MD for orders)(Physician Or   ziprasidone (GEODON) 80 MG capsule Take 80 mg by mouth 2 (two) times daily.    No facility-administered encounter medications on file as of 05/24/2021.    Review of Systems  GENERAL: No fever or chills  MOUTH and THROAT: Denies oral discomfort, gingival pain or bleeding RESPIRATORY: no cough, SOB, DOE, wheezing, hemoptysis CARDIAC: No chest pain, edema or palpitations GI: No abdominal pain, diarrhea, constipation, heart burn, nausea or vomiting GU: Denies dysuria, frequency,  hematuria or discharge NEUROLOGICAL: Denies dizziness, syncope, numbness, or headache PSYCHIATRIC: Denies feelings of depression or anxiety. No report of hallucinations, insomnia, paranoia, or agitation   Immunization History  Administered Date(s) Administered   Influenza-Unspecified 03/17/2017, 08/17/2018, 08/29/2019, 08/28/2020   Moderna Sars-Covid-2 Vaccination 01/02/2020, 01/30/2020   PPD Test 03/24/2017, 06/02/2018, 06/08/2018   Pneumococcal Polysaccharide-23 03/17/2017, 06/12/2018   Pertinent  Health Maintenance Due  Topic Date Due   DEXA SCAN  Never done   PNA vac Low Risk Adult (2 of 2 - PCV13) 04/15/2022 (Originally 06/13/2019)   INFLUENZA VACCINE  06/17/2021   Fall Risk  05/27/2018 05/14/2017  Falls in the past year? No Yes  Number falls in past yr: - 2 or more  Injury with Fall? - No     Vitals:   05/24/21 1514  BP: (!) 113/59  Pulse: 72  Resp: 16  Temp: 97.7 F (36.5 C)  Weight: 124 lb 9.6 oz (56.5 kg)  Height: 5\' 5"  (1.651 m)   Body mass index is 20.73 kg/m.  Physical Exam  GENERAL APPEARANCE: In no acute distress. Normal body habitus SKIN:  Skin is warm and dry.  MOUTH and THROAT: Lips are without lesions. Oral mucosa is moist and without lesions.  RESPIRATORY: Breathing is even & unlabored, BS CTAB CARDIAC: RRR, no murmur,no extra heart sounds, no edema GI: Abdomen soft, normal BS, no masses, no tenderness NEUROLOGICAL: + tremor. Speech is clear. Alert to self, disoriented to time and place. PSYCHIATRIC:  Affect and behavior are appropriate  Labs reviewed: Recent Labs    01/16/21 0000 05/24/21 0000  NA 140 139  K 3.9 4.3  CL 104 104  CO2 27* 24*  BUN 15 15  CREATININE 0.6 0.6  CALCIUM 9.5 9.6   Recent Labs    01/16/21 0000  AST 14  ALT 9  ALKPHOS 63  ALBUMIN 4.2   Recent Labs    01/16/21 0000 05/24/21 0000  WBC 4.7 4.8  HGB 12.7 12.7  HCT 37 36  PLT 250 239   Lab Results  Component Value Date   TSH 4.07 01/30/2021   Lab  Results  Component Value Date   HGBA1C 5.4 03/15/2018   Lab Results  Component Value Date   CHOL 139 03/15/2018   HDL 46 03/15/2018   LDLCALC 74 03/15/2018   TRIG 97 03/15/2018    Significant Diagnostic Results in last 30 days:  No results found.  Assessment/Plan  1. Failure to thrive in adult Wt Readings from Last 3 Encounters:  05/24/21 124 lb 9.6 oz (56.5 kg)  04/04/21 130 lb 3.2 oz (59.1 kg)  02/28/21 133 lb 9.6 oz (60.6 kg)   -  will now transition to hospice/comfort care  2. Parkinson disease (HCC) -    Continue carbidopa/levodopa  3. Schizoaffective disorder, depressive type (HCC) -   Mood is stable, continue buspirone, ziprasidone, lithium carbonate ER, duloxetine and Ativan -    followed by psych NP  4. Vascular dementia without behavioral disturbance (HCC) -   BIMS score 9/15, ranging in moderate cognitive impairment -   Continue memantine and donepezil    Family/ staff Communication: Discussed plan of care with resident and charge nurse.  Labs/tests ordered: None  Goals of care:   Long-term care/hospice care   10/15, DNP, MSN, FNP-BC [facility] [phone] (Monday-Friday 8:00 a.m. - 5:00 p.m.) [phone] (after hours)

## 2021-06-05 ENCOUNTER — Other Ambulatory Visit: Payer: Self-pay | Admitting: Adult Health

## 2021-06-05 DIAGNOSIS — F419 Anxiety disorder, unspecified: Secondary | ICD-10-CM

## 2021-06-05 MED ORDER — LORAZEPAM 1 MG PO TABS
1.0000 mg | ORAL_TABLET | Freq: Two times a day (BID) | ORAL | 0 refills | Status: DC
Start: 2021-06-05 — End: 2021-07-11

## 2021-06-11 ENCOUNTER — Encounter: Payer: Self-pay | Admitting: Adult Health

## 2021-06-11 ENCOUNTER — Non-Acute Institutional Stay (SKILLED_NURSING_FACILITY): Payer: Medicare Other | Admitting: Adult Health

## 2021-06-11 DIAGNOSIS — F0150 Vascular dementia without behavioral disturbance: Secondary | ICD-10-CM | POA: Diagnosis not present

## 2021-06-11 DIAGNOSIS — H1031 Unspecified acute conjunctivitis, right eye: Secondary | ICD-10-CM | POA: Diagnosis not present

## 2021-06-11 NOTE — Progress Notes (Signed)
Location:  [facility] Room Number: 225 B Place of Service:  SNF (31) Provider:  Kenard Gower, DNP, FNP-BC  Patient Care Team: Pecola Lawless, MD as PCP - General (Internal Medicine) Center, [facility] (Skilled Nursing Facility) Synetta Shadow as Physician Assistant (Internal Medicine)  Extended Emergency Contact Information Primary Emergency Contact: [name] of [place] Home Phone: [phone] Relation: Daughter Secondary Emergency Contact: [name] [place] of [place] Home Phone: [phone] Relation: Daughter  Code Status:  DNR  Goals of care: Advanced Directive information Advanced Directives 05/24/2021  Does Patient Have a Medical Advance Directive? Yes  Type of Advance Directive Out of facility DNR (pink MOST or yellow form)  Does patient want to make changes to medical advance directive? No - Patient declined  Copy of Healthcare Power of Attorney in Chart? -  Would patient like information on creating a medical advance directive? -  Pre-existing out of facility DNR order (yellow form or pink MOST form) Pink MOST form placed in chart (order not valid for inpatient use)     Chief Complaint  Patient presents with   Acute Visit    Right eye redness    HPI: Pt is a 75 y.o. female seen today for an acute visit for right eye redness. She was seen in the room today. Noted right eye crusting, slight greenish drainage, redness on sclera and slight edema. She denies having eye pain nor change in vision.  She takes donepezil 10 mg at bedtime for dementia. She is a long-term care resident of [facility] and being followed by hospice.  Past Medical History:  Diagnosis Date   Allergy    Anxiety    Arthritis    Asthma    Benign essential hypertension 03/18/2017   Cataract    Dementia (HCC)    Dementia with Lewy bodies (CODE) (HCC)    Depression    Difficulty walking    Dysphagia, oral phase     Fibromyalgia    GERD (gastroesophageal reflux disease)    Hallucinations    Hyperlipidemia    Lower extremity edema    Repeated falls    Rhabdomyolysis    Stroke (HCC)    TIA (transient ischemic attack)    Urinary incontinence    Past Surgical History:  Procedure Laterality Date   ABDOMINAL HYSTERECTOMY     CATARACT EXTRACTION     CHOLECYSTECTOMY     TONSILLECTOMY AND ADENOIDECTOMY      Allergies  Allergen Reactions   Hydrocodone Other (See Comments)    GI upset    Outpatient Encounter Medications as of 06/11/2021  Medication Sig   acetaminophen (TYLENOL) 325 MG tablet Take 650 mg by mouth See admin instructions. 650 BID PRN and 650 BID scheduled   albuterol (PROVENTIL HFA;VENTOLIN HFA) 108 (90 Base) MCG/ACT inhaler Inhale 2 puffs into the lungs every 6 (six) hours as needed for wheezing or shortness of breath.   aspirin EC 81 MG tablet Take 81 mg by mouth daily.   bisacodyl (DULCOLAX) 10 MG suppository Place 10 mg rectally as needed for moderate constipation.   busPIRone (BUSPAR) 15 MG tablet Take 15 mg by mouth 3 (three) times daily.   carbidopa-levodopa (SINEMET IR) 25-100 MG tablet Take 1 tablet by mouth 3 (three) times daily.   cholecalciferol (VITAMIN D) 1000 units tablet Take 1,000 Units by mouth daily.    donepezil (ARICEPT) 10 MG tablet Take 10 mg by mouth at bedtime.    DULoxetine (  CYMBALTA) 30 MG capsule Give 3 tablets (90mg ) by mouth daily   fluticasone (FLONASE) 50 MCG/ACT nasal spray Place 2 sprays into both nostrils daily.    Lidocaine 4 % PTCH Apply topically. APPLY 2 PATCHES TOPICALLY TO LOWER BACK ONCE DAILY FOR PAIN (REMOVE AT BEDTIME)   LITHIUM CARBONATE PO Take 450 mg by mouth at bedtime.   LORazepam (ATIVAN) 1 MG tablet Take 1 tablet (1 mg total) by mouth every 12 (twelve) hours.   magnesium hydroxide (MILK OF MAGNESIA) 400 MG/5ML suspension If no BM in 3 days, give 30 cc Milk of Magnesium p.o. x 1 dose in 24 hours as needed (Do not use standing  constipation orders for residents with renal failure CFR less than 30. Contact MD for orders) (Physician Order)   memantine (NAMENDA) 10 MG tablet Take 10 mg by mouth 2 (two) times daily.    Menthol, Topical Analgesic, (BIOFREEZE) 4 % GEL Apply 1 application topically in the morning, at noon, in the evening, and at bedtime. Apply to bilateral lower legs for pain   midodrine (PROAMATINE) 2.5 MG tablet Take 1 tablet (2.5 mg total) by mouth 2 (two) times daily as needed. For SBP <=95   Nutritional Supplement LIQD Take 120 mLs by mouth 3 (three) times daily. MedPass   pantoprazole (PROTONIX) 40 MG tablet Take 40 mg by mouth daily.    polyethylene glycol (MIRALAX / GLYCOLAX) packet Take 17 g by mouth daily as needed.    sodium fluoride (PREVIDENT 5000 PLUS) 1.1 % CREA dental cream Place 1 application onto teeth at bedtime.    Sodium Phosphates (RA SALINE ENEMA RE) If not relieved by Biscodyl suppository, give disposable Saline Enema rectally X 1 dose/24 hrs as needed (Do not use constipation standing orders for residents with renal failure/CFR less than 30. Contact MD for orders)(Physician Or   ziprasidone (GEODON) 80 MG capsule Take 80 mg by mouth 2 (two) times daily.    No facility-administered encounter medications on file as of 06/11/2021.    Review of Systems  GENERAL: No fever or chills  MOUTH and THROAT: Denies oral discomfort, gingival pain or bleeding RESPIRATORY: no cough, SOB, DOE, wheezing, hemoptysis CARDIAC: No chest pain, edema or palpitations GI: No abdominal pain, diarrhea, constipation, heart burn, nausea or vomiting GU: Denies dysuria, frequency, hematuria or discharge NEUROLOGICAL: Denies dizziness, syncope, numbness, or headache PSYCHIATRIC: Denies feelings of depression or anxiety. No report of hallucinations, insomnia, paranoia, or agitation   Immunization History  Administered Date(s) Administered   Influenza-Unspecified 03/17/2017, 08/17/2018, 08/29/2019, 08/28/2020    Moderna Sars-Covid-2 Vaccination 01/02/2020, 01/30/2020   PPD Test 03/24/2017, 06/02/2018, 06/08/2018   Pneumococcal Polysaccharide-23 03/17/2017, 06/12/2018   Pertinent  Health Maintenance Due  Topic Date Due   DEXA SCAN  Never done   PNA vac Low Risk Adult (2 of 2 - PCV13) 04/15/2022 (Originally 06/13/2019)   INFLUENZA VACCINE  06/17/2021   Fall Risk  05/27/2018 05/14/2017  Falls in the past year? No Yes  Number falls in past yr: - 2 or more  Injury with Fall? - No     Vitals:   06/11/21 1616  BP: 131/76  Pulse: 65  Resp: 18  Temp: (!) 97.1 F (36.2 C)  Weight: 124 lb 9.6 oz (56.5 kg)  Height: 5\' 5"  (1.651 m)   Body mass index is 20.73 kg/m.  Physical Exam  GENERAL APPEARANCE: Well nourished. In no acute distress. Normal body habitus SKIN:  Skin is warm and dry.  EYES: see HPI RESPIRATORY:  Breathing is even & unlabored, BS CTAB CARDIAC: RRR, no murmur,no extra heart sounds, no edema GI: Abdomen soft, normal BS, no masses, no tenderness, NEUROLOGICAL: There is no tremor. Speech is clear. Alert to self, disoriented to time and place. PSYCHIATRIC: Affect and behavior are appropriate  Labs reviewed: Recent Labs    01/16/21 0000 05/24/21 0000  NA 140 139  K 3.9 4.3  CL 104 104  CO2 27* 24*  BUN 15 15  CREATININE 0.6 0.6  CALCIUM 9.5 9.6   Recent Labs    01/16/21 0000  AST 14  ALT 9  ALKPHOS 63  ALBUMIN 4.2   Recent Labs    01/16/21 0000 05/24/21 0000  WBC 4.7 4.8  HGB 12.7 12.7  HCT 37 36  PLT 250 239   Lab Results  Component Value Date   TSH 4.07 01/30/2021   Lab Results  Component Value Date   HGBA1C 5.4 03/15/2018   Lab Results  Component Value Date   CHOL 139 03/15/2018   HDL 46 03/15/2018   LDLCALC 74 03/15/2018   TRIG 97 03/15/2018    Significant Diagnostic Results in last 30 days:  No results found.  Assessment/Plan  1. Acute bacterial conjunctivitis of right eye -  continue erythromycin 0.5% eye ointment apply half-inch  ribbon to right 4 times a day x5 days -  keep eyes clean  2. Vascular dementia without behavioral disturbance (HCC) -  BIMS score 9/15, ranging in moderate cognitive impairment -   Continue donepezil 10 mg at bedtime -    continue supportive care    Family/ staff Communication:   Discussed plan of care with resident and charge nurse.  Labs/tests ordered: None  Goals of care:   Long-term care/hospice care   Kenard Gower, DNP, MSN, FNP-BC [facility] [phone] (Monday-Friday 8:00 a.m. - 5:00 p.m.) [phone] (after hours)

## 2021-06-13 ENCOUNTER — Encounter: Payer: Self-pay | Admitting: Internal Medicine

## 2021-06-13 ENCOUNTER — Non-Acute Institutional Stay (SKILLED_NURSING_FACILITY): Payer: Medicare Other | Admitting: Internal Medicine

## 2021-06-13 DIAGNOSIS — F251 Schizoaffective disorder, depressive type: Secondary | ICD-10-CM

## 2021-06-13 DIAGNOSIS — R627 Adult failure to thrive: Secondary | ICD-10-CM

## 2021-06-13 DIAGNOSIS — D649 Anemia, unspecified: Secondary | ICD-10-CM | POA: Diagnosis not present

## 2021-06-13 DIAGNOSIS — I10 Essential (primary) hypertension: Secondary | ICD-10-CM | POA: Diagnosis not present

## 2021-06-13 NOTE — Assessment & Plan Note (Signed)
No behavioral issues described by Staff. Today she essentially slept through exam.

## 2021-06-13 NOTE — Patient Instructions (Signed)
See assessment and plan under each diagnosis in the problem list and acutely for this visit 

## 2021-06-13 NOTE — Assessment & Plan Note (Signed)
Systolic blood pressures ranged from a low of 88 up to high 137 without antihypertensive medications.  None are indicated at this time.

## 2021-06-13 NOTE — Assessment & Plan Note (Addendum)
H/H most recently dropped from values of 12.7/37 down to 11.6/33.5.This pattern also was seen in Feb 2021 w/o bleeding dyscrasias noted.   FOBT ordered but not completed. Further work-up will not be pursued as Hospice is now consulting.

## 2021-06-13 NOTE — Assessment & Plan Note (Signed)
Hospice consultation appropriate

## 2021-06-13 NOTE — Progress Notes (Signed)
   NURSING HOME LOCATION:  [facility] ROOM NUMBER:  225 B  CODE STATUS:  DNR  PCP:  Douglass Rivers MD  This is a nursing facility follow up visit of chronic medical diagnoses & to document compliance with Regulation 483.30 (c) in The Long Term Care Survey Manual Phase 2 which mandates caregiver visit ( visits can alternate among physician, PA or NP as per statutes) within 10 days of 30 days / 60 days/ 90 days post admission to SNF date    Interim medical record and care since last SNF visit was updated with review of diagnostic studies and change in clinical status since last visit were documented.  HPI: She is a permanent resident of this facility with medical diagnoses of essential hypertension, dementia with Lewy bodies, fibromyalgia, dyslipidemia, history of CVA, Parkinson's disease and schizoaffective disorder.  The most recent labs revealed no clinically significant chemistry or electrolyte abnormalities.  Sodium was 135 and serum osmolality 272, both minimally -mildly reduced.  There was a slight drop in her hemoglobin/hematocrit with values falling from 12.7/37 to 11.6/33.5.  Fecal occult blood studies were ordered but apparently not completed.  Since that time the daughter has requested Hospice consultation which is in the process of being completed.  Review of systems: She was nonverbal except to say "pretty good" when asked how she were doing.  That was the only vocalization.  Otherwise she was asleep & non communicative verbally.  Physical exam:  Pertinent or positive findings: As noted she was markedly somnolent , essentially sleeping through the exam.  She appears chronically ill.  Masked facies are present.  Torticollis is suggested with the head turned to the right.  There appeared to be increased cervical rigidity with passive range of motion.  There is trace edema at the sock line.  Pedal pulses are surprisingly good.  She follows commands poorly.  She  exhibited intermittent jerking activity of the upper and lower extremities.  General appearance:  no acute distress, increased work of breathing is present.   Lymphatic: No lymphadenopathy about the head, neck, axilla. Eyes: No conjunctival inflammation or lid edema is present. There is no scleral icterus. Ears:  External ear exam shows no significant lesions or deformities.   Nose:  External nasal examination shows no deformity or inflammation. Nasal mucosa are pink and moist without  Neck:  No thyromegaly, masses, tenderness noted.    Heart:  Normal rate and regular rhythm. S1 and S2 normal without gallop, murmur, click, rub .  Lungs:  without wheezes, rhonchi, rales, rubs. Abdomen: Bowel sounds are normal. Abdomen is soft and nontender with no organomegaly, hernias, masses. GU: Deferred  Extremities:  No cyanosis, clubbing  Neurologic exam :Balance, Rhomberg, finger to nose testing could not be completed due to clinical state No significant lesions or rash.  See summary under each active problem in the Problem List with associated updated therapeutic plan

## 2021-06-19 DIAGNOSIS — J45909 Unspecified asthma, uncomplicated: Secondary | ICD-10-CM | POA: Diagnosis not present

## 2021-06-19 DIAGNOSIS — I959 Hypotension, unspecified: Secondary | ICD-10-CM | POA: Diagnosis not present

## 2021-06-19 DIAGNOSIS — F259 Schizoaffective disorder, unspecified: Secondary | ICD-10-CM | POA: Diagnosis not present

## 2021-06-19 DIAGNOSIS — G3183 Dementia with Lewy bodies: Secondary | ICD-10-CM | POA: Diagnosis not present

## 2021-06-19 DIAGNOSIS — Z6820 Body mass index (BMI) 20.0-20.9, adult: Secondary | ICD-10-CM | POA: Diagnosis not present

## 2021-06-19 DIAGNOSIS — Z8673 Personal history of transient ischemic attack (TIA), and cerebral infarction without residual deficits: Secondary | ICD-10-CM | POA: Diagnosis not present

## 2021-06-19 DIAGNOSIS — F29 Unspecified psychosis not due to a substance or known physiological condition: Secondary | ICD-10-CM | POA: Diagnosis not present

## 2021-06-19 DIAGNOSIS — G4700 Insomnia, unspecified: Secondary | ICD-10-CM | POA: Diagnosis not present

## 2021-06-19 DIAGNOSIS — F0280 Dementia in other diseases classified elsewhere without behavioral disturbance: Secondary | ICD-10-CM | POA: Diagnosis not present

## 2021-06-19 DIAGNOSIS — F419 Anxiety disorder, unspecified: Secondary | ICD-10-CM | POA: Diagnosis not present

## 2021-06-19 DIAGNOSIS — Z8619 Personal history of other infectious and parasitic diseases: Secondary | ICD-10-CM | POA: Diagnosis not present

## 2021-06-19 DIAGNOSIS — R1310 Dysphagia, unspecified: Secondary | ICD-10-CM | POA: Diagnosis not present

## 2021-06-19 DIAGNOSIS — E785 Hyperlipidemia, unspecified: Secondary | ICD-10-CM | POA: Diagnosis not present

## 2021-06-19 DIAGNOSIS — K219 Gastro-esophageal reflux disease without esophagitis: Secondary | ICD-10-CM | POA: Diagnosis not present

## 2021-06-19 DIAGNOSIS — J309 Allergic rhinitis, unspecified: Secondary | ICD-10-CM | POA: Diagnosis not present

## 2021-06-19 DIAGNOSIS — M1990 Unspecified osteoarthritis, unspecified site: Secondary | ICD-10-CM | POA: Diagnosis not present

## 2021-06-19 DIAGNOSIS — H269 Unspecified cataract: Secondary | ICD-10-CM | POA: Diagnosis not present

## 2021-06-19 DIAGNOSIS — I10 Essential (primary) hypertension: Secondary | ICD-10-CM | POA: Diagnosis not present

## 2021-06-19 DIAGNOSIS — R634 Abnormal weight loss: Secondary | ICD-10-CM | POA: Diagnosis not present

## 2021-06-19 DIAGNOSIS — F413 Other mixed anxiety disorders: Secondary | ICD-10-CM | POA: Diagnosis not present

## 2021-06-19 DIAGNOSIS — F32A Depression, unspecified: Secondary | ICD-10-CM | POA: Diagnosis not present

## 2021-06-19 DIAGNOSIS — R627 Adult failure to thrive: Secondary | ICD-10-CM | POA: Diagnosis not present

## 2021-06-19 DIAGNOSIS — M797 Fibromyalgia: Secondary | ICD-10-CM | POA: Diagnosis not present

## 2021-06-19 DIAGNOSIS — F251 Schizoaffective disorder, depressive type: Secondary | ICD-10-CM | POA: Diagnosis not present

## 2021-06-19 DIAGNOSIS — E559 Vitamin D deficiency, unspecified: Secondary | ICD-10-CM | POA: Diagnosis not present

## 2021-06-21 DIAGNOSIS — G3183 Dementia with Lewy bodies: Secondary | ICD-10-CM | POA: Diagnosis not present

## 2021-06-21 DIAGNOSIS — F0280 Dementia in other diseases classified elsewhere without behavioral disturbance: Secondary | ICD-10-CM | POA: Diagnosis not present

## 2021-06-21 DIAGNOSIS — I10 Essential (primary) hypertension: Secondary | ICD-10-CM | POA: Diagnosis not present

## 2021-06-21 DIAGNOSIS — F419 Anxiety disorder, unspecified: Secondary | ICD-10-CM | POA: Diagnosis not present

## 2021-06-21 DIAGNOSIS — E785 Hyperlipidemia, unspecified: Secondary | ICD-10-CM | POA: Diagnosis not present

## 2021-06-21 DIAGNOSIS — I959 Hypotension, unspecified: Secondary | ICD-10-CM | POA: Diagnosis not present

## 2021-06-25 DIAGNOSIS — I10 Essential (primary) hypertension: Secondary | ICD-10-CM | POA: Diagnosis not present

## 2021-06-25 DIAGNOSIS — F419 Anxiety disorder, unspecified: Secondary | ICD-10-CM | POA: Diagnosis not present

## 2021-06-25 DIAGNOSIS — E785 Hyperlipidemia, unspecified: Secondary | ICD-10-CM | POA: Diagnosis not present

## 2021-06-25 DIAGNOSIS — G3183 Dementia with Lewy bodies: Secondary | ICD-10-CM | POA: Diagnosis not present

## 2021-06-25 DIAGNOSIS — I959 Hypotension, unspecified: Secondary | ICD-10-CM | POA: Diagnosis not present

## 2021-06-25 DIAGNOSIS — F0280 Dementia in other diseases classified elsewhere without behavioral disturbance: Secondary | ICD-10-CM | POA: Diagnosis not present

## 2021-06-27 DIAGNOSIS — I959 Hypotension, unspecified: Secondary | ICD-10-CM | POA: Diagnosis not present

## 2021-06-27 DIAGNOSIS — F419 Anxiety disorder, unspecified: Secondary | ICD-10-CM | POA: Diagnosis not present

## 2021-06-27 DIAGNOSIS — I10 Essential (primary) hypertension: Secondary | ICD-10-CM | POA: Diagnosis not present

## 2021-06-27 DIAGNOSIS — F0280 Dementia in other diseases classified elsewhere without behavioral disturbance: Secondary | ICD-10-CM | POA: Diagnosis not present

## 2021-06-27 DIAGNOSIS — G3183 Dementia with Lewy bodies: Secondary | ICD-10-CM | POA: Diagnosis not present

## 2021-06-27 DIAGNOSIS — E785 Hyperlipidemia, unspecified: Secondary | ICD-10-CM | POA: Diagnosis not present

## 2021-06-28 DIAGNOSIS — I959 Hypotension, unspecified: Secondary | ICD-10-CM | POA: Diagnosis not present

## 2021-06-28 DIAGNOSIS — G3183 Dementia with Lewy bodies: Secondary | ICD-10-CM | POA: Diagnosis not present

## 2021-06-28 DIAGNOSIS — F0280 Dementia in other diseases classified elsewhere without behavioral disturbance: Secondary | ICD-10-CM | POA: Diagnosis not present

## 2021-06-28 DIAGNOSIS — I10 Essential (primary) hypertension: Secondary | ICD-10-CM | POA: Diagnosis not present

## 2021-06-28 DIAGNOSIS — F419 Anxiety disorder, unspecified: Secondary | ICD-10-CM | POA: Diagnosis not present

## 2021-06-28 DIAGNOSIS — E785 Hyperlipidemia, unspecified: Secondary | ICD-10-CM | POA: Diagnosis not present

## 2021-07-02 DIAGNOSIS — I10 Essential (primary) hypertension: Secondary | ICD-10-CM | POA: Diagnosis not present

## 2021-07-02 DIAGNOSIS — E785 Hyperlipidemia, unspecified: Secondary | ICD-10-CM | POA: Diagnosis not present

## 2021-07-02 DIAGNOSIS — F0280 Dementia in other diseases classified elsewhere without behavioral disturbance: Secondary | ICD-10-CM | POA: Diagnosis not present

## 2021-07-02 DIAGNOSIS — I959 Hypotension, unspecified: Secondary | ICD-10-CM | POA: Diagnosis not present

## 2021-07-02 DIAGNOSIS — G3183 Dementia with Lewy bodies: Secondary | ICD-10-CM | POA: Diagnosis not present

## 2021-07-02 DIAGNOSIS — F419 Anxiety disorder, unspecified: Secondary | ICD-10-CM | POA: Diagnosis not present

## 2021-07-03 DIAGNOSIS — G3183 Dementia with Lewy bodies: Secondary | ICD-10-CM | POA: Diagnosis not present

## 2021-07-03 DIAGNOSIS — I959 Hypotension, unspecified: Secondary | ICD-10-CM | POA: Diagnosis not present

## 2021-07-03 DIAGNOSIS — F0280 Dementia in other diseases classified elsewhere without behavioral disturbance: Secondary | ICD-10-CM | POA: Diagnosis not present

## 2021-07-03 DIAGNOSIS — E785 Hyperlipidemia, unspecified: Secondary | ICD-10-CM | POA: Diagnosis not present

## 2021-07-03 DIAGNOSIS — I10 Essential (primary) hypertension: Secondary | ICD-10-CM | POA: Diagnosis not present

## 2021-07-03 DIAGNOSIS — F419 Anxiety disorder, unspecified: Secondary | ICD-10-CM | POA: Diagnosis not present

## 2021-07-04 ENCOUNTER — Non-Acute Institutional Stay (SKILLED_NURSING_FACILITY): Payer: Medicare Other | Admitting: Adult Health

## 2021-07-04 ENCOUNTER — Encounter: Payer: Self-pay | Admitting: Adult Health

## 2021-07-04 DIAGNOSIS — G20 Parkinson's disease: Secondary | ICD-10-CM

## 2021-07-04 DIAGNOSIS — I959 Hypotension, unspecified: Secondary | ICD-10-CM | POA: Diagnosis not present

## 2021-07-04 DIAGNOSIS — G3183 Dementia with Lewy bodies: Secondary | ICD-10-CM | POA: Diagnosis not present

## 2021-07-04 DIAGNOSIS — F251 Schizoaffective disorder, depressive type: Secondary | ICD-10-CM

## 2021-07-04 DIAGNOSIS — F0150 Vascular dementia without behavioral disturbance: Secondary | ICD-10-CM | POA: Diagnosis not present

## 2021-07-04 DIAGNOSIS — I10 Essential (primary) hypertension: Secondary | ICD-10-CM | POA: Diagnosis not present

## 2021-07-04 DIAGNOSIS — K219 Gastro-esophageal reflux disease without esophagitis: Secondary | ICD-10-CM

## 2021-07-04 DIAGNOSIS — F419 Anxiety disorder, unspecified: Secondary | ICD-10-CM | POA: Diagnosis not present

## 2021-07-04 DIAGNOSIS — E785 Hyperlipidemia, unspecified: Secondary | ICD-10-CM | POA: Diagnosis not present

## 2021-07-04 DIAGNOSIS — G20A1 Parkinson's disease without dyskinesia, without mention of fluctuations: Secondary | ICD-10-CM

## 2021-07-04 DIAGNOSIS — F0280 Dementia in other diseases classified elsewhere without behavioral disturbance: Secondary | ICD-10-CM | POA: Diagnosis not present

## 2021-07-04 NOTE — Progress Notes (Signed)
Location:  [facility] Room Number: 225-B Place of Service:  SNF (31) Provider:  Durenda Age, DNP, FNP-BC  Patient Care Team: Hendricks Limes, MD as PCP - General (Internal Medicine) Center, [place] ([place]) Rolm Baptise as Physician Assistant (Internal Medicine)  Extended Emergency Contact Information Primary Emergency Contact: Vivia Birmingham of [place] Phone: [phone] Relation: Daughter Secondary Emergency Contact: Merleen Milliner States of [place] Phone: [phone] Relation: Daughter  Code Status:  DNR  Goals of care: Advanced Directive information Advanced Directives 07/04/2021  Does Patient Have a Medical Advance Directive? Yes  Type of Advance Directive Out of facility DNR (pink MOST or yellow form)  Does patient want to make changes to medical advance directive? No - Patient declined  Copy of [place] in Chart? -  Would patient like information on creating a medical advance directive? -  Pre-existing out of facility DNR order (yellow form or pink MOST form) -     Chief Complaint  Patient presents with   Medical Management of Chronic Issues    Routine Visit.    HPI:  Pt is a 75 y.o. female seen today for medical management of chronic diseases. She is a long-term care resident of [facility]. Noted to have bilateral hand tremors. She is needing extensive assistance with ADLs. She takes Carbidopa-Levodopa 25-100 mg  TID for Parkinson's Disease. No reported agitation nor hallucinations. She takes Ziprasidone 80 mg BID, Lithium carbonate ER 450 mg at bedtime and Buspirone 15 mg TID. Namenda was recently discontinued. BIMS score 9/15, ranging in moderate cognitive impairment. She is followed by hospice care.    Past Medical History:  Diagnosis Date   Allergy    Anxiety    Arthritis    Asthma    Benign essential  hypertension 03/18/2017   Cataract    Dementia ([place])    Dementia with Lewy bodies (CODE) (HCC)    Depression    Difficulty walking    Dysphagia, oral phase    Fibromyalgia    GERD (gastroesophageal reflux disease)    Hallucinations    Hyperlipidemia    Lower extremity edema    Repeated falls    Rhabdomyolysis    Stroke (HCC)    TIA (transient ischemic attack)    Urinary incontinence    Past Surgical History:  Procedure Laterality Date   ABDOMINAL HYSTERECTOMY     CATARACT EXTRACTION     CHOLECYSTECTOMY     TONSILLECTOMY AND ADENOIDECTOMY      Allergies  Allergen Reactions   Hydrocodone Other (See Comments)    GI upset    Outpatient Encounter Medications as of 07/04/2021  Medication Sig   acetaminophen (TYLENOL) 325 MG tablet Take 650 mg by mouth See admin instructions. 650 BID PRN and 650 BID scheduled   albuterol (PROVENTIL HFA;VENTOLIN HFA) 108 (90 Base) MCG/ACT inhaler Inhale 2 puffs into the lungs every 6 (six) hours as needed for wheezing or shortness of breath.   aspirin EC 81 MG tablet Take 81 mg by mouth daily.   bisacodyl (DULCOLAX) 10 MG suppository Place 10 mg rectally as needed for moderate constipation.   busPIRone (BUSPAR) 15 MG tablet Take 15 mg by mouth 3 (three) times daily.   carbidopa-levodopa (SINEMET IR) 25-100 MG tablet Take 1 tablet by mouth 3 (three) times daily.   chlorhexidine gluconate, MEDLINE KIT, (PERIDEX) 0.12 % solution Use as directed 15 mLs in the mouth or  throat 2 (two) times daily.   cholecalciferol (VITAMIN D) 1000 units tablet Take 1,000 Units by mouth daily.    DULoxetine (CYMBALTA) 30 MG capsule Give 3 tablets (52m) by mouth daily   fluticasone (FLONASE) 50 MCG/ACT nasal spray Place 2 sprays into both nostrils daily.    LITHIUM CARBONATE PO Take 450 mg by mouth at bedtime.   magnesium hydroxide (MILK OF MAGNESIA) 400 MG/5ML suspension If no BM in 3 days, give 30 cc Milk of Magnesium p.o. x 1 dose in 24 hours as needed (Do not use  standing constipation orders for residents with renal failure CFR less than 30. Contact MD for orders) (Physician Order)   midodrine (PROAMATINE) 2.5 MG tablet Take 1 tablet (2.5 mg total) by mouth 2 (two) times daily as needed. For SBP <=95   Nutritional Supplement LIQD Take 120 mLs by mouth 3 (three) times daily. MedPass   pantoprazole (PROTONIX) 40 MG tablet Take 40 mg by mouth daily.    polyethylene glycol (MIRALAX / GLYCOLAX) packet Take 17 g by mouth daily as needed.    sodium fluoride (PREVIDENT 5000 PLUS) 1.1 % CREA dental cream Place 1 application onto teeth at bedtime.    Sodium Phosphates (RA SALINE ENEMA RE) If not relieved by Biscodyl suppository, give disposable Saline Enema rectally X 1 dose/24 hrs as needed (Do not use constipation standing orders for residents with renal failure/CFR less than 30. Contact MD for orders)(Physician Or   ziprasidone (GEODON) 80 MG capsule Take 80 mg by mouth 2 (two) times daily.    [DISCONTINUED] LORazepam (ATIVAN) 1 MG tablet Take 1 tablet (1 mg total) by mouth every 12 (twelve) hours.   [DISCONTINUED] donepezil (ARICEPT) 10 MG tablet Take 10 mg by mouth at bedtime.    [DISCONTINUED] Lidocaine 4 % PTCH Apply topically. APPLY 2 PATCHES TOPICALLY TO LOWER BACK ONCE DAILY FOR PAIN (REMOVE AT BEDTIME)   [DISCONTINUED] memantine (NAMENDA) 10 MG tablet Take 10 mg by mouth 2 (two) times daily.    [DISCONTINUED] Menthol, Topical Analgesic, (BIOFREEZE) 4 % GEL Apply 1 application topically in the morning, at noon, in the evening, and at bedtime. Apply to bilateral lower legs for pain   No facility-administered encounter medications on file as of 07/04/2021.    Review of Systems  GENERAL: No change in appetite, no fatigue, no weight changes, no fever or chills  MOUTH and THROAT: Denies oral discomfort, gingival pain or bleeding  RESPIRATORY: no cough, SOB, DOE, wheezing, hemoptysis CARDIAC: No chest pain, edema or palpitations GI: No abdominal pain,  diarrhea, constipation, heart burn, nausea or vomiting GU: Denies dysuria, frequency, hematuria or discharge PSYCHIATRIC: Denies feelings of depression or anxiety. No report of hallucinations, insomnia, paranoia, or agitation   Immunization History  Administered Date(s) Administered   Influenza-Unspecified 03/17/2017, 08/17/2018, 08/29/2019, 08/28/2020   Moderna Sars-Covid-2 Vaccination 01/02/2020, 01/30/2020   PPD Test 03/24/2017, 06/02/2018, 06/08/2018   Pneumococcal Polysaccharide-23 03/17/2017, 06/12/2018   Pertinent  Health Maintenance Due  Topic Date Due   DEXA SCAN  Never done   INFLUENZA VACCINE  06/17/2021   PNA vac Low Risk Adult (2 of 2 - PCV13) 04/15/2022 (Originally 06/13/2019)   Fall Risk  05/27/2018 05/14/2017  Falls in the past year? No Yes  Number falls in past yr: - 2 or more  Injury with Fall? - No     Vitals:   07/04/21 1530  BP: (!) 84/51  Pulse: 70  Resp: 18  Temp: (!) 97.3 F (36.3 C)  Weight:  122 lb 12.8 oz (55.7 kg)  Height: _0  (1.651 m)   Body mass index is 20.43 kg/m.  Physical Exam  GENERAL APPEARANCE: Well nourished. In no acute distress. Normal body habitus SKIN:  Skin is warm and dry.  MOUTH and THROAT: Lips are without lesions. Oral mucosa is moist and without lesions.  RESPIRATORY: Breathing is even & unlabored, BS CTAB CARDIAC: RRR, no murmur,no extra heart sounds, no edema GI: Abdomen soft, normal BS, no masses, no tenderness NEUROLOGICAL: Has tremors. Speech is clear. Alert to self, disoriented to time and place. PSYCHIATRIC: Affect and behavior are appropriate  Labs reviewed: Recent Labs    01/16/21 0000 05/24/21 0000  NA 140 139  K 3.9 4.3  CL 104 104  CO2 27* 24*  BUN 15 15  CREATININE 0.6 0.6  CALCIUM 9.5 9.6   Recent Labs    01/16/21 0000  AST 14  ALT 9  ALKPHOS 63  ALBUMIN 4.2   Recent Labs    01/16/21 0000 05/24/21 0000  WBC 4.7 4.8  HGB 12.7 12.7  HCT 37 36  PLT 250 239   Lab Results  Component  Value Date   TSH 4.07 01/30/2021   Lab Results  Component Value Date   HGBA1C 5.4 03/15/2018   Lab Results  Component Value Date   CHOL 139 03/15/2018   HDL 46 03/15/2018   LDLCALC 74 03/15/2018   TRIG 97 03/15/2018    Significant Diagnostic Results in last 30 days:  No results found.  Assessment/Plan  1. Parkinson disease ([place]) -  stable, has BUE tremors -  continue Carbidopa-Levodopa  2. GERD without esophagitis -  stable, continue Pantoprazole  3. Schizoaffective disorder, depressive type ([place]) -  denies hallucinations, no agitations -  continue Lithium carbonate, Buspirone and Ziprasidone  4. Anxiety -  mood is stable, continue Ativan  5. Vascular dementia without behavioral disturbance ([place]) -  BIMS score 9/15, continue supportive care -   followed by hospice care    Family/ staff Communication:   Discussed plan of care with resident and charge nurse.  Labs/tests ordered:  None  Goals of care:   Long-term care/Hospice care    Durenda Age, DNP, MSN, FNP-BC [facility] [phone] (Monday-Friday 8:00 a.m. - 5:00 p.m.) [phone] (after hours)

## 2021-07-05 DIAGNOSIS — F0280 Dementia in other diseases classified elsewhere without behavioral disturbance: Secondary | ICD-10-CM | POA: Diagnosis not present

## 2021-07-05 DIAGNOSIS — F419 Anxiety disorder, unspecified: Secondary | ICD-10-CM | POA: Diagnosis not present

## 2021-07-05 DIAGNOSIS — I10 Essential (primary) hypertension: Secondary | ICD-10-CM | POA: Diagnosis not present

## 2021-07-05 DIAGNOSIS — E785 Hyperlipidemia, unspecified: Secondary | ICD-10-CM | POA: Diagnosis not present

## 2021-07-05 DIAGNOSIS — G3183 Dementia with Lewy bodies: Secondary | ICD-10-CM | POA: Diagnosis not present

## 2021-07-05 DIAGNOSIS — I959 Hypotension, unspecified: Secondary | ICD-10-CM | POA: Diagnosis not present

## 2021-07-09 DIAGNOSIS — G3183 Dementia with Lewy bodies: Secondary | ICD-10-CM | POA: Diagnosis not present

## 2021-07-09 DIAGNOSIS — F0280 Dementia in other diseases classified elsewhere without behavioral disturbance: Secondary | ICD-10-CM | POA: Diagnosis not present

## 2021-07-09 DIAGNOSIS — I959 Hypotension, unspecified: Secondary | ICD-10-CM | POA: Diagnosis not present

## 2021-07-09 DIAGNOSIS — E785 Hyperlipidemia, unspecified: Secondary | ICD-10-CM | POA: Diagnosis not present

## 2021-07-09 DIAGNOSIS — I10 Essential (primary) hypertension: Secondary | ICD-10-CM | POA: Diagnosis not present

## 2021-07-09 DIAGNOSIS — F419 Anxiety disorder, unspecified: Secondary | ICD-10-CM | POA: Diagnosis not present

## 2021-07-10 ENCOUNTER — Non-Acute Institutional Stay (SKILLED_NURSING_FACILITY): Payer: Medicare Other | Admitting: Adult Health

## 2021-07-10 DIAGNOSIS — G3183 Dementia with Lewy bodies: Secondary | ICD-10-CM | POA: Diagnosis not present

## 2021-07-10 DIAGNOSIS — F0150 Vascular dementia without behavioral disturbance: Secondary | ICD-10-CM

## 2021-07-10 DIAGNOSIS — E785 Hyperlipidemia, unspecified: Secondary | ICD-10-CM | POA: Diagnosis not present

## 2021-07-10 DIAGNOSIS — G20A1 Parkinson's disease without dyskinesia, without mention of fluctuations: Secondary | ICD-10-CM

## 2021-07-10 DIAGNOSIS — F419 Anxiety disorder, unspecified: Secondary | ICD-10-CM | POA: Diagnosis not present

## 2021-07-10 DIAGNOSIS — I959 Hypotension, unspecified: Secondary | ICD-10-CM | POA: Diagnosis not present

## 2021-07-10 DIAGNOSIS — F0280 Dementia in other diseases classified elsewhere without behavioral disturbance: Secondary | ICD-10-CM | POA: Diagnosis not present

## 2021-07-10 DIAGNOSIS — Z7189 Other specified counseling: Secondary | ICD-10-CM

## 2021-07-10 DIAGNOSIS — F251 Schizoaffective disorder, depressive type: Secondary | ICD-10-CM | POA: Diagnosis not present

## 2021-07-10 DIAGNOSIS — G20 Parkinson's disease: Secondary | ICD-10-CM

## 2021-07-10 DIAGNOSIS — K219 Gastro-esophageal reflux disease without esophagitis: Secondary | ICD-10-CM

## 2021-07-10 DIAGNOSIS — I10 Essential (primary) hypertension: Secondary | ICD-10-CM | POA: Diagnosis not present

## 2021-07-10 NOTE — Progress Notes (Signed)
Location:  [facility] Room Number: [place] of Service:  SNF (31) Provider:  Durenda Age, DNP, FNP-BC  Patient Care Team: Hendricks Limes, MD as PCP - General (Internal Medicine) Center, [place] ([place]) Rolm Baptise as Physician Assistant (Internal Medicine)  Extended Emergency Contact Information Primary Emergency Contact: Vivia Birmingham of [place] Phone: [phone] Relation: Daughter Secondary Emergency Contact: Merleen Milliner States of [place] Phone: [phone] Relation: Daughter  Code Status:   DNR  Goals of care: Advanced Directive information Advanced Directives 07/04/2021  Does Patient Have a Medical Advance Directive? Yes  Type of Advance Directive Out of facility DNR (pink MOST or yellow form)  Does patient want to make changes to medical advance directive? No - Patient declined  Copy of [place] in Chart? -  Would patient like information on creating a medical advance directive? -  Pre-existing out of facility DNR order (yellow form or pink MOST form) -     Chief Complaint  Patient presents with   Acute Visit    Care Plan Meeting    HPI:  Pt is a 75 y.o. female who had a care plan meeting today attended by social worker, NP, life enrichment and daughter, who attended via telephone conference. She is a long-term care resident of [facility]. She remains to be DNR and followed by hospice care. Discussed medications, vital signs and weights. Daughter requested for resident to start on [place] again. She stated that she has noticed that her cognitive status has further declined after discontinuing the Namenda. She, also, requested for resident to be on mechanical soft diet. The meeting lasted for 20 minutes.   Past Medical History:  Diagnosis Date   Allergy    Anxiety    Arthritis    Asthma    Benign essential  hypertension 03/18/2017   Cataract    Dementia ([place])    Dementia with Lewy bodies (CODE) (HCC)    Depression    Difficulty walking    Dysphagia, oral phase    Fibromyalgia    GERD (gastroesophageal reflux disease)    Hallucinations    Hyperlipidemia    Lower extremity edema    Repeated falls    Rhabdomyolysis    Stroke (HCC)    TIA (transient ischemic attack)    Urinary incontinence    Past Surgical History:  Procedure Laterality Date   ABDOMINAL HYSTERECTOMY     CATARACT EXTRACTION     CHOLECYSTECTOMY     TONSILLECTOMY AND ADENOIDECTOMY      Allergies  Allergen Reactions   Hydrocodone Other (See Comments)    GI upset    Outpatient Encounter Medications as of 07/10/2021  Medication Sig   acetaminophen (TYLENOL) 325 MG tablet Take 650 mg by mouth See admin instructions. 650 BID PRN and 650 BID scheduled   albuterol (PROVENTIL HFA;VENTOLIN HFA) 108 (90 Base) MCG/ACT inhaler Inhale 2 puffs into the lungs every 6 (six) hours as needed for wheezing or shortness of breath.   aspirin EC 81 MG tablet Take 81 mg by mouth daily.   bisacodyl (DULCOLAX) 10 MG suppository Place 10 mg rectally as needed for moderate constipation.   busPIRone (BUSPAR) 15 MG tablet Take 15 mg by mouth 3 (three) times daily.   carbidopa-levodopa (SINEMET IR) 25-100 MG tablet Take 1 tablet by mouth 3 (three) times daily.   chlorhexidine gluconate, MEDLINE KIT, (PERIDEX) 0.12 % solution Use as  directed 15 mLs in the mouth or throat 2 (two) times daily.   cholecalciferol (VITAMIN D) 1000 units tablet Take 1,000 Units by mouth daily.    DULoxetine (CYMBALTA) 30 MG capsule Give 3 tablets ($RemoveBe'90mg'[id number]$ ) by mouth daily   fluticasone (FLONASE) 50 MCG/ACT nasal spray Place 2 sprays into both nostrils daily.    LITHIUM CARBONATE PO Take 450 mg by mouth at bedtime.   magnesium hydroxide (MILK OF MAGNESIA) 400 MG/5ML suspension If no BM in 3 days, give 30 cc Milk of Magnesium p.o. x 1 dose in 24 hours as needed (Do not use  standing constipation orders for residents with renal failure CFR less than 30. Contact MD for orders) (Physician Order)   midodrine (PROAMATINE) 2.5 MG tablet Take 1 tablet (2.5 mg total) by mouth 2 (two) times daily as needed. For SBP <=95   Nutritional Supplement LIQD Take 120 mLs by mouth 3 (three) times daily. MedPass   pantoprazole (PROTONIX) 40 MG tablet Take 40 mg by mouth daily.    polyethylene glycol (MIRALAX / GLYCOLAX) packet Take 17 g by mouth daily as needed.    sodium fluoride (PREVIDENT 5000 PLUS) 1.1 % CREA dental cream Place 1 application onto teeth at bedtime.    Sodium Phosphates (RA SALINE ENEMA RE) If not relieved by Biscodyl suppository, give disposable Saline Enema rectally X 1 dose/24 hrs as needed (Do not use constipation standing orders for residents with renal failure/CFR less than 30. Contact MD for orders)(Physician Or   ziprasidone (GEODON) 80 MG capsule Take 80 mg by mouth 2 (two) times daily.    [DISCONTINUED] LORazepam (ATIVAN) 1 MG tablet Take 1 tablet (1 mg total) by mouth every 12 (twelve) hours.   No facility-administered encounter medications on file as of 07/10/2021.    Review of Systems  GENERAL: No change in appetite, no fatigue, no weight changes, no fever or chills  MOUTH and THROAT: Denies oral discomfort, gingival pain or bleeding RESPIRATORY: no cough, SOB, DOE, wheezing, hemoptysis CARDIAC: No chest pain, edema or palpitations GI: No abdominal pain, diarrhea, constipation, heart burn, nausea or vomiting GU: Denies dysuria, frequency, hematuria or discharge NEUROLOGICAL: Denies dizziness, syncope, numbness, or headache PSYCHIATRIC: Denies feelings of depression or anxiety. No report of hallucinations, insomnia, paranoia, or agitation   Immunization History  Administered Date(s) Administered   Influenza-Unspecified 03/17/2017, 08/17/2018, 08/29/2019, 08/28/2020   Moderna Sars-Covid-2 Vaccination 01/02/2020, 01/30/2020   PPD Test 03/24/2017,  06/02/2018, 06/08/2018   Pneumococcal Polysaccharide-23 03/17/2017, 06/12/2018   Pertinent  Health Maintenance Due  Topic Date Due   DEXA SCAN  Never done   INFLUENZA VACCINE  06/17/2021   PNA vac Low Risk Adult (2 of 2 - PCV13) 04/15/2022 (Originally 06/13/2019)   Fall Risk  05/27/2018 05/14/2017  Falls in the past year? No Yes  Number falls in past yr: - 2 or more  Injury with Fall? - No     Vitals:   07/10/21 1300  BP: (!) 104/56  Pulse: 68  Temp: (!) 97.3 F (36.3 C)  Weight: 122 lb 12.8 oz (55.7 kg)   Body mass index is 20.43 kg/m.  Physical Exam  GENERAL APPEARANCE: Well nourished. In no acute distress. Normal body habitus SKIN:  Skin is warm and dry.  MOUTH and THROAT: Lips are without lesions. Oral mucosa is moist and without lesions.  RESPIRATORY: Breathing is even & unlabored, BS CTAB CARDIAC: RRR, no murmur,no extra heart sounds, no edema GI: Abdomen soft, normal BS, no masses, no tenderness NEUROLOGICAL:  Has tremor. Speech is clear. Alert to self, disoriented to time and place. PSYCHIATRIC:  Affect and behavior are appropriate  Labs reviewed: Recent Labs    01/16/21 0000 05/24/21 0000  NA 140 139  K 3.9 4.3  CL 104 104  CO2 27* 24*  BUN 15 15  CREATININE 0.6 0.6  CALCIUM 9.5 9.6   Recent Labs    01/16/21 0000  AST 14  ALT 9  ALKPHOS 63  ALBUMIN 4.2   Recent Labs    01/16/21 0000 05/24/21 0000  WBC 4.7 4.8  HGB 12.7 12.7  HCT 37 36  PLT 250 239   Lab Results  Component Value Date   TSH 4.07 01/30/2021   Lab Results  Component Value Date   HGBA1C 5.4 03/15/2018   Lab Results  Component Value Date   CHOL 139 03/15/2018   HDL 46 03/15/2018   LDLCALC 74 03/15/2018   TRIG 97 03/15/2018    Significant Diagnostic Results in last 30 days:  No results found.  Assessment/Plan  1. Advance care planning -   remains to be DNR -  discussed medications, vital signs and weights  2. Parkinson disease ([place]) -  stable, continue  Carbidopa-Levodopa  3. GERD without esophagitis -  stable, continue Protonix  4. Anxiety -  mood is stable, continue Ativan  5. Schizoaffective disorder, depressive type ([place]) -  no agitations/hallucinations, continue Lithium carbonate, Buspirone and Ziprasidone  6. Vascular dementia without behavioral disturbance (HCC) -  BIMS score 9/15,will re-start Namenda 5 mg BID X 7 days then Namenda 10 mg BID -  continue supportive care   Family/ staff Communication:   Discussed plan of care with resident and charge nurse.  Labs/tests ordered:  None  Goals of care:   Long-term care/Hospice care   Durenda Age, DNP, MSN, FNP-BC [facility] and [facility] Medicine [phone] (Monday-Friday 8:00 a.m. - 5:00 p.m.) [phone] (after hours)

## 2021-07-11 ENCOUNTER — Other Ambulatory Visit: Payer: Self-pay | Admitting: Adult Health

## 2021-07-11 DIAGNOSIS — E785 Hyperlipidemia, unspecified: Secondary | ICD-10-CM | POA: Diagnosis not present

## 2021-07-11 DIAGNOSIS — G3183 Dementia with Lewy bodies: Secondary | ICD-10-CM | POA: Diagnosis not present

## 2021-07-11 DIAGNOSIS — I10 Essential (primary) hypertension: Secondary | ICD-10-CM | POA: Diagnosis not present

## 2021-07-11 DIAGNOSIS — F419 Anxiety disorder, unspecified: Secondary | ICD-10-CM | POA: Diagnosis not present

## 2021-07-11 DIAGNOSIS — I959 Hypotension, unspecified: Secondary | ICD-10-CM | POA: Diagnosis not present

## 2021-07-11 DIAGNOSIS — F0280 Dementia in other diseases classified elsewhere without behavioral disturbance: Secondary | ICD-10-CM | POA: Diagnosis not present

## 2021-07-11 MED ORDER — LORAZEPAM 1 MG PO TABS: 1.0000 mg | ORAL_TABLET | Freq: Two times a day (BID) | ORAL | 0 refills | Status: DC

## 2021-07-12 DIAGNOSIS — F0280 Dementia in other diseases classified elsewhere without behavioral disturbance: Secondary | ICD-10-CM | POA: Diagnosis not present

## 2021-07-12 DIAGNOSIS — I10 Essential (primary) hypertension: Secondary | ICD-10-CM | POA: Diagnosis not present

## 2021-07-12 DIAGNOSIS — I959 Hypotension, unspecified: Secondary | ICD-10-CM | POA: Diagnosis not present

## 2021-07-12 DIAGNOSIS — G3183 Dementia with Lewy bodies: Secondary | ICD-10-CM | POA: Diagnosis not present

## 2021-07-12 DIAGNOSIS — F419 Anxiety disorder, unspecified: Secondary | ICD-10-CM | POA: Diagnosis not present

## 2021-07-12 DIAGNOSIS — E785 Hyperlipidemia, unspecified: Secondary | ICD-10-CM | POA: Diagnosis not present

## 2021-07-16 DIAGNOSIS — E785 Hyperlipidemia, unspecified: Secondary | ICD-10-CM | POA: Diagnosis not present

## 2021-07-16 DIAGNOSIS — G3183 Dementia with Lewy bodies: Secondary | ICD-10-CM | POA: Diagnosis not present

## 2021-07-16 DIAGNOSIS — I10 Essential (primary) hypertension: Secondary | ICD-10-CM | POA: Diagnosis not present

## 2021-07-16 DIAGNOSIS — F419 Anxiety disorder, unspecified: Secondary | ICD-10-CM | POA: Diagnosis not present

## 2021-07-16 DIAGNOSIS — I959 Hypotension, unspecified: Secondary | ICD-10-CM | POA: Diagnosis not present

## 2021-07-16 DIAGNOSIS — F0280 Dementia in other diseases classified elsewhere without behavioral disturbance: Secondary | ICD-10-CM | POA: Diagnosis not present

## 2021-07-17 DIAGNOSIS — G3183 Dementia with Lewy bodies: Secondary | ICD-10-CM | POA: Diagnosis not present

## 2021-07-17 DIAGNOSIS — F419 Anxiety disorder, unspecified: Secondary | ICD-10-CM | POA: Diagnosis not present

## 2021-07-17 DIAGNOSIS — I10 Essential (primary) hypertension: Secondary | ICD-10-CM | POA: Diagnosis not present

## 2021-07-17 DIAGNOSIS — I959 Hypotension, unspecified: Secondary | ICD-10-CM | POA: Diagnosis not present

## 2021-07-17 DIAGNOSIS — E785 Hyperlipidemia, unspecified: Secondary | ICD-10-CM | POA: Diagnosis not present

## 2021-07-17 DIAGNOSIS — F0280 Dementia in other diseases classified elsewhere without behavioral disturbance: Secondary | ICD-10-CM | POA: Diagnosis not present

## 2021-07-18 DIAGNOSIS — M1990 Unspecified osteoarthritis, unspecified site: Secondary | ICD-10-CM | POA: Diagnosis not present

## 2021-07-18 DIAGNOSIS — K219 Gastro-esophageal reflux disease without esophagitis: Secondary | ICD-10-CM | POA: Diagnosis not present

## 2021-07-18 DIAGNOSIS — R627 Adult failure to thrive: Secondary | ICD-10-CM | POA: Diagnosis not present

## 2021-07-18 DIAGNOSIS — I959 Hypotension, unspecified: Secondary | ICD-10-CM | POA: Diagnosis not present

## 2021-07-18 DIAGNOSIS — M797 Fibromyalgia: Secondary | ICD-10-CM | POA: Diagnosis not present

## 2021-07-18 DIAGNOSIS — Z8673 Personal history of transient ischemic attack (TIA), and cerebral infarction without residual deficits: Secondary | ICD-10-CM | POA: Diagnosis not present

## 2021-07-18 DIAGNOSIS — F259 Schizoaffective disorder, unspecified: Secondary | ICD-10-CM | POA: Diagnosis not present

## 2021-07-18 DIAGNOSIS — E785 Hyperlipidemia, unspecified: Secondary | ICD-10-CM | POA: Diagnosis not present

## 2021-07-18 DIAGNOSIS — J45909 Unspecified asthma, uncomplicated: Secondary | ICD-10-CM | POA: Diagnosis not present

## 2021-07-18 DIAGNOSIS — G4700 Insomnia, unspecified: Secondary | ICD-10-CM | POA: Diagnosis not present

## 2021-07-18 DIAGNOSIS — F0280 Dementia in other diseases classified elsewhere without behavioral disturbance: Secondary | ICD-10-CM | POA: Diagnosis not present

## 2021-07-18 DIAGNOSIS — F419 Anxiety disorder, unspecified: Secondary | ICD-10-CM | POA: Diagnosis not present

## 2021-07-18 DIAGNOSIS — Z6820 Body mass index (BMI) 20.0-20.9, adult: Secondary | ICD-10-CM | POA: Diagnosis not present

## 2021-07-18 DIAGNOSIS — R634 Abnormal weight loss: Secondary | ICD-10-CM | POA: Diagnosis not present

## 2021-07-18 DIAGNOSIS — G3183 Dementia with Lewy bodies: Secondary | ICD-10-CM | POA: Diagnosis not present

## 2021-07-18 DIAGNOSIS — E559 Vitamin D deficiency, unspecified: Secondary | ICD-10-CM | POA: Diagnosis not present

## 2021-07-18 DIAGNOSIS — H269 Unspecified cataract: Secondary | ICD-10-CM | POA: Diagnosis not present

## 2021-07-18 DIAGNOSIS — R1310 Dysphagia, unspecified: Secondary | ICD-10-CM | POA: Diagnosis not present

## 2021-07-18 DIAGNOSIS — F32A Depression, unspecified: Secondary | ICD-10-CM | POA: Diagnosis not present

## 2021-07-18 DIAGNOSIS — Z8619 Personal history of other infectious and parasitic diseases: Secondary | ICD-10-CM | POA: Diagnosis not present

## 2021-07-18 DIAGNOSIS — J309 Allergic rhinitis, unspecified: Secondary | ICD-10-CM | POA: Diagnosis not present

## 2021-07-18 DIAGNOSIS — I10 Essential (primary) hypertension: Secondary | ICD-10-CM | POA: Diagnosis not present

## 2021-07-19 DIAGNOSIS — F0280 Dementia in other diseases classified elsewhere without behavioral disturbance: Secondary | ICD-10-CM | POA: Diagnosis not present

## 2021-07-19 DIAGNOSIS — I10 Essential (primary) hypertension: Secondary | ICD-10-CM | POA: Diagnosis not present

## 2021-07-19 DIAGNOSIS — I959 Hypotension, unspecified: Secondary | ICD-10-CM | POA: Diagnosis not present

## 2021-07-19 DIAGNOSIS — G3183 Dementia with Lewy bodies: Secondary | ICD-10-CM | POA: Diagnosis not present

## 2021-07-19 DIAGNOSIS — E785 Hyperlipidemia, unspecified: Secondary | ICD-10-CM | POA: Diagnosis not present

## 2021-07-19 DIAGNOSIS — F419 Anxiety disorder, unspecified: Secondary | ICD-10-CM | POA: Diagnosis not present

## 2021-07-23 DIAGNOSIS — F419 Anxiety disorder, unspecified: Secondary | ICD-10-CM | POA: Diagnosis not present

## 2021-07-23 DIAGNOSIS — I959 Hypotension, unspecified: Secondary | ICD-10-CM | POA: Diagnosis not present

## 2021-07-23 DIAGNOSIS — F0280 Dementia in other diseases classified elsewhere without behavioral disturbance: Secondary | ICD-10-CM | POA: Diagnosis not present

## 2021-07-23 DIAGNOSIS — I10 Essential (primary) hypertension: Secondary | ICD-10-CM | POA: Diagnosis not present

## 2021-07-23 DIAGNOSIS — G3183 Dementia with Lewy bodies: Secondary | ICD-10-CM | POA: Diagnosis not present

## 2021-07-23 DIAGNOSIS — E785 Hyperlipidemia, unspecified: Secondary | ICD-10-CM | POA: Diagnosis not present

## 2021-07-24 DIAGNOSIS — I10 Essential (primary) hypertension: Secondary | ICD-10-CM | POA: Diagnosis not present

## 2021-07-24 DIAGNOSIS — F0280 Dementia in other diseases classified elsewhere without behavioral disturbance: Secondary | ICD-10-CM | POA: Diagnosis not present

## 2021-07-24 DIAGNOSIS — G3183 Dementia with Lewy bodies: Secondary | ICD-10-CM | POA: Diagnosis not present

## 2021-07-24 DIAGNOSIS — F419 Anxiety disorder, unspecified: Secondary | ICD-10-CM | POA: Diagnosis not present

## 2021-07-24 DIAGNOSIS — I959 Hypotension, unspecified: Secondary | ICD-10-CM | POA: Diagnosis not present

## 2021-07-24 DIAGNOSIS — E785 Hyperlipidemia, unspecified: Secondary | ICD-10-CM | POA: Diagnosis not present

## 2021-07-25 DIAGNOSIS — G3183 Dementia with Lewy bodies: Secondary | ICD-10-CM | POA: Diagnosis not present

## 2021-07-25 DIAGNOSIS — E785 Hyperlipidemia, unspecified: Secondary | ICD-10-CM | POA: Diagnosis not present

## 2021-07-25 DIAGNOSIS — I959 Hypotension, unspecified: Secondary | ICD-10-CM | POA: Diagnosis not present

## 2021-07-25 DIAGNOSIS — F419 Anxiety disorder, unspecified: Secondary | ICD-10-CM | POA: Diagnosis not present

## 2021-07-25 DIAGNOSIS — I10 Essential (primary) hypertension: Secondary | ICD-10-CM | POA: Diagnosis not present

## 2021-07-25 DIAGNOSIS — F0280 Dementia in other diseases classified elsewhere without behavioral disturbance: Secondary | ICD-10-CM | POA: Diagnosis not present

## 2021-07-26 DIAGNOSIS — E785 Hyperlipidemia, unspecified: Secondary | ICD-10-CM | POA: Diagnosis not present

## 2021-07-26 DIAGNOSIS — F0280 Dementia in other diseases classified elsewhere without behavioral disturbance: Secondary | ICD-10-CM | POA: Diagnosis not present

## 2021-07-26 DIAGNOSIS — I959 Hypotension, unspecified: Secondary | ICD-10-CM | POA: Diagnosis not present

## 2021-07-26 DIAGNOSIS — I10 Essential (primary) hypertension: Secondary | ICD-10-CM | POA: Diagnosis not present

## 2021-07-26 DIAGNOSIS — G3183 Dementia with Lewy bodies: Secondary | ICD-10-CM | POA: Diagnosis not present

## 2021-07-26 DIAGNOSIS — F419 Anxiety disorder, unspecified: Secondary | ICD-10-CM | POA: Diagnosis not present

## 2021-07-30 DIAGNOSIS — I959 Hypotension, unspecified: Secondary | ICD-10-CM | POA: Diagnosis not present

## 2021-07-30 DIAGNOSIS — I10 Essential (primary) hypertension: Secondary | ICD-10-CM | POA: Diagnosis not present

## 2021-07-30 DIAGNOSIS — F0280 Dementia in other diseases classified elsewhere without behavioral disturbance: Secondary | ICD-10-CM | POA: Diagnosis not present

## 2021-07-30 DIAGNOSIS — E785 Hyperlipidemia, unspecified: Secondary | ICD-10-CM | POA: Diagnosis not present

## 2021-07-30 DIAGNOSIS — F419 Anxiety disorder, unspecified: Secondary | ICD-10-CM | POA: Diagnosis not present

## 2021-07-30 DIAGNOSIS — G3183 Dementia with Lewy bodies: Secondary | ICD-10-CM | POA: Diagnosis not present

## 2021-07-31 DIAGNOSIS — G3183 Dementia with Lewy bodies: Secondary | ICD-10-CM | POA: Diagnosis not present

## 2021-07-31 DIAGNOSIS — I959 Hypotension, unspecified: Secondary | ICD-10-CM | POA: Diagnosis not present

## 2021-07-31 DIAGNOSIS — F0280 Dementia in other diseases classified elsewhere without behavioral disturbance: Secondary | ICD-10-CM | POA: Diagnosis not present

## 2021-07-31 DIAGNOSIS — I10 Essential (primary) hypertension: Secondary | ICD-10-CM | POA: Diagnosis not present

## 2021-07-31 DIAGNOSIS — E785 Hyperlipidemia, unspecified: Secondary | ICD-10-CM | POA: Diagnosis not present

## 2021-07-31 DIAGNOSIS — F419 Anxiety disorder, unspecified: Secondary | ICD-10-CM | POA: Diagnosis not present

## 2021-08-01 DIAGNOSIS — E785 Hyperlipidemia, unspecified: Secondary | ICD-10-CM | POA: Diagnosis not present

## 2021-08-01 DIAGNOSIS — I10 Essential (primary) hypertension: Secondary | ICD-10-CM | POA: Diagnosis not present

## 2021-08-01 DIAGNOSIS — G3183 Dementia with Lewy bodies: Secondary | ICD-10-CM | POA: Diagnosis not present

## 2021-08-01 DIAGNOSIS — F419 Anxiety disorder, unspecified: Secondary | ICD-10-CM | POA: Diagnosis not present

## 2021-08-01 DIAGNOSIS — F0280 Dementia in other diseases classified elsewhere without behavioral disturbance: Secondary | ICD-10-CM | POA: Diagnosis not present

## 2021-08-01 DIAGNOSIS — I959 Hypotension, unspecified: Secondary | ICD-10-CM | POA: Diagnosis not present

## 2021-08-02 DIAGNOSIS — I959 Hypotension, unspecified: Secondary | ICD-10-CM | POA: Diagnosis not present

## 2021-08-02 DIAGNOSIS — G3183 Dementia with Lewy bodies: Secondary | ICD-10-CM | POA: Diagnosis not present

## 2021-08-02 DIAGNOSIS — I10 Essential (primary) hypertension: Secondary | ICD-10-CM | POA: Diagnosis not present

## 2021-08-02 DIAGNOSIS — F0280 Dementia in other diseases classified elsewhere without behavioral disturbance: Secondary | ICD-10-CM | POA: Diagnosis not present

## 2021-08-02 DIAGNOSIS — F419 Anxiety disorder, unspecified: Secondary | ICD-10-CM | POA: Diagnosis not present

## 2021-08-02 DIAGNOSIS — E785 Hyperlipidemia, unspecified: Secondary | ICD-10-CM | POA: Diagnosis not present

## 2021-08-06 DIAGNOSIS — G3183 Dementia with Lewy bodies: Secondary | ICD-10-CM | POA: Diagnosis not present

## 2021-08-06 DIAGNOSIS — I10 Essential (primary) hypertension: Secondary | ICD-10-CM | POA: Diagnosis not present

## 2021-08-06 DIAGNOSIS — F0280 Dementia in other diseases classified elsewhere without behavioral disturbance: Secondary | ICD-10-CM | POA: Diagnosis not present

## 2021-08-06 DIAGNOSIS — F419 Anxiety disorder, unspecified: Secondary | ICD-10-CM | POA: Diagnosis not present

## 2021-08-06 DIAGNOSIS — E785 Hyperlipidemia, unspecified: Secondary | ICD-10-CM | POA: Diagnosis not present

## 2021-08-06 DIAGNOSIS — I959 Hypotension, unspecified: Secondary | ICD-10-CM | POA: Diagnosis not present

## 2021-08-07 ENCOUNTER — Other Ambulatory Visit: Payer: Self-pay | Admitting: Adult Health

## 2021-08-07 DIAGNOSIS — F419 Anxiety disorder, unspecified: Secondary | ICD-10-CM

## 2021-08-07 MED ORDER — LORAZEPAM 1 MG PO TABS: 1.0000 mg | ORAL_TABLET | Freq: Two times a day (BID) | ORAL | 0 refills | Status: DC

## 2021-08-08 DIAGNOSIS — G3183 Dementia with Lewy bodies: Secondary | ICD-10-CM | POA: Diagnosis not present

## 2021-08-08 DIAGNOSIS — E785 Hyperlipidemia, unspecified: Secondary | ICD-10-CM | POA: Diagnosis not present

## 2021-08-08 DIAGNOSIS — F419 Anxiety disorder, unspecified: Secondary | ICD-10-CM | POA: Diagnosis not present

## 2021-08-08 DIAGNOSIS — I10 Essential (primary) hypertension: Secondary | ICD-10-CM | POA: Diagnosis not present

## 2021-08-08 DIAGNOSIS — F0280 Dementia in other diseases classified elsewhere without behavioral disturbance: Secondary | ICD-10-CM | POA: Diagnosis not present

## 2021-08-08 DIAGNOSIS — I959 Hypotension, unspecified: Secondary | ICD-10-CM | POA: Diagnosis not present

## 2021-08-09 DIAGNOSIS — E785 Hyperlipidemia, unspecified: Secondary | ICD-10-CM | POA: Diagnosis not present

## 2021-08-09 DIAGNOSIS — F419 Anxiety disorder, unspecified: Secondary | ICD-10-CM | POA: Diagnosis not present

## 2021-08-09 DIAGNOSIS — G3183 Dementia with Lewy bodies: Secondary | ICD-10-CM | POA: Diagnosis not present

## 2021-08-09 DIAGNOSIS — I959 Hypotension, unspecified: Secondary | ICD-10-CM | POA: Diagnosis not present

## 2021-08-09 DIAGNOSIS — I10 Essential (primary) hypertension: Secondary | ICD-10-CM | POA: Diagnosis not present

## 2021-08-09 DIAGNOSIS — F0280 Dementia in other diseases classified elsewhere without behavioral disturbance: Secondary | ICD-10-CM | POA: Diagnosis not present

## 2021-08-13 DIAGNOSIS — I959 Hypotension, unspecified: Secondary | ICD-10-CM | POA: Diagnosis not present

## 2021-08-13 DIAGNOSIS — E785 Hyperlipidemia, unspecified: Secondary | ICD-10-CM | POA: Diagnosis not present

## 2021-08-13 DIAGNOSIS — G3183 Dementia with Lewy bodies: Secondary | ICD-10-CM | POA: Diagnosis not present

## 2021-08-13 DIAGNOSIS — I10 Essential (primary) hypertension: Secondary | ICD-10-CM | POA: Diagnosis not present

## 2021-08-13 DIAGNOSIS — F419 Anxiety disorder, unspecified: Secondary | ICD-10-CM | POA: Diagnosis not present

## 2021-08-13 DIAGNOSIS — F0280 Dementia in other diseases classified elsewhere without behavioral disturbance: Secondary | ICD-10-CM | POA: Diagnosis not present

## 2021-08-15 DIAGNOSIS — I959 Hypotension, unspecified: Secondary | ICD-10-CM | POA: Diagnosis not present

## 2021-08-15 DIAGNOSIS — G3183 Dementia with Lewy bodies: Secondary | ICD-10-CM | POA: Diagnosis not present

## 2021-08-15 DIAGNOSIS — F419 Anxiety disorder, unspecified: Secondary | ICD-10-CM | POA: Diagnosis not present

## 2021-08-15 DIAGNOSIS — E785 Hyperlipidemia, unspecified: Secondary | ICD-10-CM | POA: Diagnosis not present

## 2021-08-15 DIAGNOSIS — I10 Essential (primary) hypertension: Secondary | ICD-10-CM | POA: Diagnosis not present

## 2021-08-15 DIAGNOSIS — F0280 Dementia in other diseases classified elsewhere without behavioral disturbance: Secondary | ICD-10-CM | POA: Diagnosis not present

## 2021-08-16 DIAGNOSIS — F419 Anxiety disorder, unspecified: Secondary | ICD-10-CM | POA: Diagnosis not present

## 2021-08-16 DIAGNOSIS — G3183 Dementia with Lewy bodies: Secondary | ICD-10-CM | POA: Diagnosis not present

## 2021-08-16 DIAGNOSIS — I959 Hypotension, unspecified: Secondary | ICD-10-CM | POA: Diagnosis not present

## 2021-08-16 DIAGNOSIS — F0280 Dementia in other diseases classified elsewhere without behavioral disturbance: Secondary | ICD-10-CM | POA: Diagnosis not present

## 2021-08-16 DIAGNOSIS — E785 Hyperlipidemia, unspecified: Secondary | ICD-10-CM | POA: Diagnosis not present

## 2021-08-16 DIAGNOSIS — I10 Essential (primary) hypertension: Secondary | ICD-10-CM | POA: Diagnosis not present

## 2021-08-17 DIAGNOSIS — I959 Hypotension, unspecified: Secondary | ICD-10-CM | POA: Diagnosis not present

## 2021-08-17 DIAGNOSIS — R627 Adult failure to thrive: Secondary | ICD-10-CM | POA: Diagnosis not present

## 2021-08-17 DIAGNOSIS — Z8619 Personal history of other infectious and parasitic diseases: Secondary | ICD-10-CM | POA: Diagnosis not present

## 2021-08-17 DIAGNOSIS — F0284 Dementia in other diseases classified elsewhere, unspecified severity, with anxiety: Secondary | ICD-10-CM | POA: Diagnosis not present

## 2021-08-17 DIAGNOSIS — E785 Hyperlipidemia, unspecified: Secondary | ICD-10-CM | POA: Diagnosis not present

## 2021-08-17 DIAGNOSIS — Z8673 Personal history of transient ischemic attack (TIA), and cerebral infarction without residual deficits: Secondary | ICD-10-CM | POA: Diagnosis not present

## 2021-08-17 DIAGNOSIS — F259 Schizoaffective disorder, unspecified: Secondary | ICD-10-CM | POA: Diagnosis not present

## 2021-08-17 DIAGNOSIS — F0283 Dementia in other diseases classified elsewhere, unspecified severity, with mood disturbance: Secondary | ICD-10-CM | POA: Diagnosis not present

## 2021-08-17 DIAGNOSIS — J309 Allergic rhinitis, unspecified: Secondary | ICD-10-CM | POA: Diagnosis not present

## 2021-08-17 DIAGNOSIS — I10 Essential (primary) hypertension: Secondary | ICD-10-CM | POA: Diagnosis not present

## 2021-08-17 DIAGNOSIS — G4700 Insomnia, unspecified: Secondary | ICD-10-CM | POA: Diagnosis not present

## 2021-08-17 DIAGNOSIS — G20 Parkinson's disease: Secondary | ICD-10-CM | POA: Diagnosis not present

## 2021-08-17 DIAGNOSIS — E559 Vitamin D deficiency, unspecified: Secondary | ICD-10-CM | POA: Diagnosis not present

## 2021-08-17 DIAGNOSIS — M1990 Unspecified osteoarthritis, unspecified site: Secondary | ICD-10-CM | POA: Diagnosis not present

## 2021-08-17 DIAGNOSIS — M797 Fibromyalgia: Secondary | ICD-10-CM | POA: Diagnosis not present

## 2021-08-17 DIAGNOSIS — R1310 Dysphagia, unspecified: Secondary | ICD-10-CM | POA: Diagnosis not present

## 2021-08-17 DIAGNOSIS — Z6820 Body mass index (BMI) 20.0-20.9, adult: Secondary | ICD-10-CM | POA: Diagnosis not present

## 2021-08-17 DIAGNOSIS — H269 Unspecified cataract: Secondary | ICD-10-CM | POA: Diagnosis not present

## 2021-08-17 DIAGNOSIS — G3183 Dementia with Lewy bodies: Secondary | ICD-10-CM | POA: Diagnosis not present

## 2021-08-17 DIAGNOSIS — K219 Gastro-esophageal reflux disease without esophagitis: Secondary | ICD-10-CM | POA: Diagnosis not present

## 2021-08-17 DIAGNOSIS — R634 Abnormal weight loss: Secondary | ICD-10-CM | POA: Diagnosis not present

## 2021-08-17 DIAGNOSIS — J45909 Unspecified asthma, uncomplicated: Secondary | ICD-10-CM | POA: Diagnosis not present

## 2021-08-20 DIAGNOSIS — J45909 Unspecified asthma, uncomplicated: Secondary | ICD-10-CM | POA: Diagnosis not present

## 2021-08-20 DIAGNOSIS — F0284 Dementia in other diseases classified elsewhere, unspecified severity, with anxiety: Secondary | ICD-10-CM | POA: Diagnosis not present

## 2021-08-20 DIAGNOSIS — G3183 Dementia with Lewy bodies: Secondary | ICD-10-CM | POA: Diagnosis not present

## 2021-08-20 DIAGNOSIS — I959 Hypotension, unspecified: Secondary | ICD-10-CM | POA: Diagnosis not present

## 2021-08-20 DIAGNOSIS — I10 Essential (primary) hypertension: Secondary | ICD-10-CM | POA: Diagnosis not present

## 2021-08-20 DIAGNOSIS — E785 Hyperlipidemia, unspecified: Secondary | ICD-10-CM | POA: Diagnosis not present

## 2021-08-21 ENCOUNTER — Non-Acute Institutional Stay (SKILLED_NURSING_FACILITY): Payer: Medicare Other | Admitting: Adult Health

## 2021-08-21 ENCOUNTER — Encounter: Payer: Self-pay | Admitting: Adult Health

## 2021-08-21 DIAGNOSIS — F0150 Vascular dementia without behavioral disturbance: Secondary | ICD-10-CM

## 2021-08-21 DIAGNOSIS — I10 Essential (primary) hypertension: Secondary | ICD-10-CM | POA: Diagnosis not present

## 2021-08-21 DIAGNOSIS — F251 Schizoaffective disorder, depressive type: Secondary | ICD-10-CM | POA: Diagnosis not present

## 2021-08-21 DIAGNOSIS — I959 Hypotension, unspecified: Secondary | ICD-10-CM | POA: Diagnosis not present

## 2021-08-21 DIAGNOSIS — G20A1 Parkinson's disease without dyskinesia, without mention of fluctuations: Secondary | ICD-10-CM

## 2021-08-21 DIAGNOSIS — G3183 Dementia with Lewy bodies: Secondary | ICD-10-CM | POA: Diagnosis not present

## 2021-08-21 DIAGNOSIS — F0284 Dementia in other diseases classified elsewhere, unspecified severity, with anxiety: Secondary | ICD-10-CM | POA: Diagnosis not present

## 2021-08-21 DIAGNOSIS — R627 Adult failure to thrive: Secondary | ICD-10-CM | POA: Diagnosis not present

## 2021-08-21 DIAGNOSIS — E785 Hyperlipidemia, unspecified: Secondary | ICD-10-CM | POA: Diagnosis not present

## 2021-08-21 DIAGNOSIS — K219 Gastro-esophageal reflux disease without esophagitis: Secondary | ICD-10-CM | POA: Diagnosis not present

## 2021-08-21 DIAGNOSIS — G20 Parkinson's disease: Secondary | ICD-10-CM | POA: Diagnosis not present

## 2021-08-21 DIAGNOSIS — J45909 Unspecified asthma, uncomplicated: Secondary | ICD-10-CM | POA: Diagnosis not present

## 2021-08-21 NOTE — Progress Notes (Signed)
Location:  [facility] Room Number: 225-B Place of Service:  SNF (31) Provider:  Kenard Gower, DNP, FNP-BC  Patient Care Team: Pecola Lawless, MD as PCP - General (Internal Medicine) Center, [facility] (Skilled [facility] Facility) Synetta Shadow as Physician Assistant (Internal Medicine)  Extended Emergency Contact Information Primary Emergency Contact: [name] of [place] Home Phone: [phone] Relation: Daughter Secondary Emergency Contact: [name] [place] of [place] Home Phone: [phone] Relation: Daughter  Code Status:  DNR  Goals of care: Advanced Directive information Advanced Directives 08/21/2021  Does Patient Have a Medical Advance Directive? Yes  Type of Advance Directive Out of facility DNR (pink MOST or yellow form)  Does patient want to make changes to medical advance directive? No - Patient declined  Copy of Healthcare Power of Attorney in Chart? -  Would patient like information on creating a medical advance directive? -  Pre-existing out of facility DNR order (yellow form or pink MOST form) -     Chief Complaint  Patient presents with   Medical Management of Chronic Issues    Routine Visit    HPI:  Pt is a 75 y.o. female seen today for medical management of chronic diseases. She is a long-term care resident of [facility] and followed by hospice care. She has a PMH of TIA, stroke, dyslipidemia and asthma.  She takes pantoprazole 40 mg daily for GERD.  She denies having abdominal pain or acid reflux.  Stable.  She takes duloxetine 90 mg daily, lithium carbonate 450 mg at bedtime, Ativan 1 mg every 12 hours and ziprasidone 80 mg twice a day for his schizoaffective disorder, depressive type. BIMS score 9/15, ranging in moderate cognitive impairment.  She takes Aricept 5 mg at bedtime and Namenda 10 mg twice daily for dementia.   Past Medical History:   Diagnosis Date   Allergy    Anxiety    Arthritis    Asthma    Benign essential hypertension 03/18/2017   Cataract    Dementia (HCC)    Dementia with Lewy bodies (CODE)    Depression    Difficulty walking    Dysphagia, oral phase    Fibromyalgia    GERD (gastroesophageal reflux disease)    Hallucinations    Hyperlipidemia    Lower extremity edema    Repeated falls    Rhabdomyolysis    Stroke (HCC)    TIA (transient ischemic attack)    Urinary incontinence    Past Surgical History:  Procedure Laterality Date   ABDOMINAL HYSTERECTOMY     CATARACT EXTRACTION     CHOLECYSTECTOMY     TONSILLECTOMY AND ADENOIDECTOMY      Allergies  Allergen Reactions   Hydrocodone Other (See Comments)    GI upset    Outpatient Encounter Medications as of 08/21/2021  Medication Sig   acetaminophen (TYLENOL) 325 MG tablet Take 650 mg by mouth See admin instructions. 650 BID PRN and 650 BID scheduled   albuterol (PROVENTIL HFA;VENTOLIN HFA) 108 (90 Base) MCG/ACT inhaler Inhale 2 puffs into the lungs every 6 (six) hours as needed for wheezing or shortness of breath.   aspirin EC 81 MG tablet Take 81 mg by mouth daily.   bisacodyl (DULCOLAX) 10 MG suppository Place 10 mg rectally as needed for moderate constipation.   busPIRone (BUSPAR) 15 MG tablet Take 15 mg by mouth 3 (three) times daily.   carbidopa-levodopa (SINEMET IR) 25-100 MG tablet Take 1 tablet  by mouth 3 (three) times daily.   chlorhexidine gluconate, MEDLINE KIT, (PERIDEX) 0.12 % solution Use as directed 15 mLs in the mouth or throat 2 (two) times daily.   cholecalciferol (VITAMIN D) 1000 units tablet Take 1,000 Units by mouth daily.    donepezil (ARICEPT) 5 MG tablet Take 5 mg by mouth at bedtime.   DULoxetine (CYMBALTA) 30 MG capsule Give 3 tablets ($RemoveBe'90mg'nbuHkpvNr$ ) by mouth daily   Eyelid Cleansers (OCUSOFT LID SCRUB EX) Apply 1 application topically 2 (two) times daily. Both eye and rinse thoroughly   fluticasone (FLONASE) 50 MCG/ACT nasal  spray Place 2 sprays into both nostrils daily.    LITHIUM CARBONATE PO Take 450 mg by mouth at bedtime.   LORazepam (ATIVAN) 1 MG tablet Take 1 tablet (1 mg total) by mouth every 12 (twelve) hours.   magnesium hydroxide (MILK OF MAGNESIA) 400 MG/5ML suspension If no BM in 3 days, give 30 cc Milk of Magnesium p.o. x 1 dose in 24 hours as needed (Do not use standing constipation orders for residents with renal failure CFR less than 30. Contact MD for orders) (Physician Order)   memantine (NAMENDA) 10 MG tablet Take 10 mg by mouth 2 (two) times daily.   midodrine (PROAMATINE) 2.5 MG tablet Take 1 tablet (2.5 mg total) by mouth 2 (two) times daily as needed. For SBP <=95   Nutritional Supplement LIQD Take 120 mLs by mouth 3 (three) times daily. MedPass   pantoprazole (PROTONIX) 40 MG tablet Take 40 mg by mouth daily.    polyethylene glycol (MIRALAX / GLYCOLAX) packet Take 17 g by mouth daily as needed.    sodium fluoride (PREVIDENT 5000 PLUS) 1.1 % CREA dental cream Place 1 application onto teeth at bedtime.    Sodium Phosphates (RA SALINE ENEMA RE) If not relieved by Biscodyl suppository, give disposable Saline Enema rectally X 1 dose/24 hrs as needed (Do not use constipation standing orders for residents with renal failure/CFR less than 30. Contact MD for orders)(Physician Or   ziprasidone (GEODON) 80 MG capsule Take 80 mg by mouth 2 (two) times daily.    No facility-administered encounter medications on file as of 08/21/2021.    Review of Systems  GENERAL: No fever or chills  MOUTH and THROAT: Denies oral discomfort, gingival pain or bleeding RESPIRATORY: no cough, SOB, DOE, wheezing, hemoptysis CARDIAC: No chest pain, edema or palpitations GI: No abdominal pain, diarrhea, constipation, heart burn, nausea or vomiting GU: Denies dysuria, frequency, hematuria or discharge NEUROLOGICAL: Denies dizziness, syncope, numbness, or headache PSYCHIATRIC: Denies feelings of depression or anxiety. No  report of hallucinations, insomnia, paranoia, or agitation   Immunization History  Administered Date(s) Administered   Influenza-Unspecified 03/17/2017, 08/17/2018, 08/29/2019, 08/28/2020   Moderna Sars-Covid-2 Vaccination 01/02/2020, 01/30/2020   PPD Test 03/24/2017, 06/02/2018, 06/08/2018   Pneumococcal Polysaccharide-23 03/17/2017, 06/12/2018   Pertinent  Health Maintenance Due  Topic Date Due   DEXA SCAN  Never done   INFLUENZA VACCINE  06/17/2021   Fall Risk  05/27/2018 05/14/2017  Falls in the past year? No Yes  Number falls in past yr: - 2 or more  Injury with Fall? - No     Vitals:   08/21/21 1211  BP: (!) 99/56  Pulse: 64  Resp: (!) 21  Temp: 97.7 F (36.5 C)  Weight: 116 lb 6.4 oz (52.8 kg)  Height: $Remove'5\' 5"'qWZGrcC$  (1.651 m)   Body mass index is 19.37 kg/m.  Physical Exam  GENERAL APPEARANCE:  In no acute distress.  SKIN:  Skin is warm and dry.  MOUTH and THROAT: Lips are without lesions. Oral mucosa is moist and without lesions.  RESPIRATORY: Breathing is even & unlabored, BS CTAB CARDIAC: RRR, no murmur,no extra heart sounds, no edema GI: Abdomen soft, normal BS, no masses, no tenderness NEUROLOGICAL: + tremor. Speech is clear. Alert to self, disoriented to time and place. PSYCHIATRIC:  Affect and behavior are appropriate  Labs reviewed: Recent Labs    01/16/21 0000 05/24/21 0000  NA 140 139  K 3.9 4.3  CL 104 104  CO2 27* 24*  BUN 15 15  CREATININE 0.6 0.6  CALCIUM 9.5 9.6   Recent Labs    01/16/21 0000  AST 14  ALT 9  ALKPHOS 63  ALBUMIN 4.2   Recent Labs    01/16/21 0000 05/24/21 0000  WBC 4.7 4.8  HGB 12.7 12.7  HCT 37 36  PLT 250 239   Lab Results  Component Value Date   TSH 4.07 01/30/2021   Lab Results  Component Value Date   HGBA1C 5.4 03/15/2018   Lab Results  Component Value Date   CHOL 139 03/15/2018   HDL 46 03/15/2018   LDLCALC 74 03/15/2018   TRIG 97 03/15/2018    Significant Diagnostic Results in last 30 days:   No results found.  Assessment/Plan  1. GERD without esophagitis -    Stable, continue pantoprazole  2. Parkinson disease ([place]) -  stable, continue carbidopa-levodopa  3. Schizoaffective disorder, depressive type (HCC) -   Mood is stable, continue duloxetine, Ativan, ziprasidone and lithium  4. Failure to thrive in adult Wt Readings from Last 3 Encounters:  08/21/21 116 lb 6.4 oz (52.8 kg)  07/10/21 122 lb 12.8 oz (55.7 kg)  07/04/21 122 lb 12.8 oz (55.7 kg)   -    Continue supplementations -    Followed by hospice care  5. Vascular dementia without behavioral disturbance (HCC) -  BIMS score 9/10, ranging in moderate cognitive impairment -  Continue Namenda and Aricept     Family/ staff Communication:   Discussed plan of care with resident and charge nurse.  Labs/tests ordered: None  Goals of care:   Long-term care/hospice care   Durenda Age, DNP, MSN, FNP-BC [facility] and Adult [facility] [phone] (Monday-Friday 8:00 a.m. - 5:00 p.m.) [phone] (after hours)

## 2021-08-22 DIAGNOSIS — J45909 Unspecified asthma, uncomplicated: Secondary | ICD-10-CM | POA: Diagnosis not present

## 2021-08-22 DIAGNOSIS — F0284 Dementia in other diseases classified elsewhere, unspecified severity, with anxiety: Secondary | ICD-10-CM | POA: Diagnosis not present

## 2021-08-22 DIAGNOSIS — E785 Hyperlipidemia, unspecified: Secondary | ICD-10-CM | POA: Diagnosis not present

## 2021-08-22 DIAGNOSIS — I10 Essential (primary) hypertension: Secondary | ICD-10-CM | POA: Diagnosis not present

## 2021-08-22 DIAGNOSIS — G3183 Dementia with Lewy bodies: Secondary | ICD-10-CM | POA: Diagnosis not present

## 2021-08-22 DIAGNOSIS — I959 Hypotension, unspecified: Secondary | ICD-10-CM | POA: Diagnosis not present

## 2021-08-23 DIAGNOSIS — F0284 Dementia in other diseases classified elsewhere, unspecified severity, with anxiety: Secondary | ICD-10-CM | POA: Diagnosis not present

## 2021-08-23 DIAGNOSIS — I959 Hypotension, unspecified: Secondary | ICD-10-CM | POA: Diagnosis not present

## 2021-08-23 DIAGNOSIS — G3183 Dementia with Lewy bodies: Secondary | ICD-10-CM | POA: Diagnosis not present

## 2021-08-23 DIAGNOSIS — E785 Hyperlipidemia, unspecified: Secondary | ICD-10-CM | POA: Diagnosis not present

## 2021-08-23 DIAGNOSIS — J45909 Unspecified asthma, uncomplicated: Secondary | ICD-10-CM | POA: Diagnosis not present

## 2021-08-23 DIAGNOSIS — I10 Essential (primary) hypertension: Secondary | ICD-10-CM | POA: Diagnosis not present

## 2021-08-27 DIAGNOSIS — G3183 Dementia with Lewy bodies: Secondary | ICD-10-CM | POA: Diagnosis not present

## 2021-08-27 DIAGNOSIS — E785 Hyperlipidemia, unspecified: Secondary | ICD-10-CM | POA: Diagnosis not present

## 2021-08-27 DIAGNOSIS — I10 Essential (primary) hypertension: Secondary | ICD-10-CM | POA: Diagnosis not present

## 2021-08-27 DIAGNOSIS — I959 Hypotension, unspecified: Secondary | ICD-10-CM | POA: Diagnosis not present

## 2021-08-27 DIAGNOSIS — F0284 Dementia in other diseases classified elsewhere, unspecified severity, with anxiety: Secondary | ICD-10-CM | POA: Diagnosis not present

## 2021-08-27 DIAGNOSIS — J45909 Unspecified asthma, uncomplicated: Secondary | ICD-10-CM | POA: Diagnosis not present

## 2021-08-28 DIAGNOSIS — G3183 Dementia with Lewy bodies: Secondary | ICD-10-CM | POA: Diagnosis not present

## 2021-08-28 DIAGNOSIS — F29 Unspecified psychosis not due to a substance or known physiological condition: Secondary | ICD-10-CM | POA: Diagnosis not present

## 2021-08-28 DIAGNOSIS — F251 Schizoaffective disorder, depressive type: Secondary | ICD-10-CM | POA: Diagnosis not present

## 2021-08-28 DIAGNOSIS — F413 Other mixed anxiety disorders: Secondary | ICD-10-CM | POA: Diagnosis not present

## 2021-08-29 DIAGNOSIS — E785 Hyperlipidemia, unspecified: Secondary | ICD-10-CM | POA: Diagnosis not present

## 2021-08-29 DIAGNOSIS — G3183 Dementia with Lewy bodies: Secondary | ICD-10-CM | POA: Diagnosis not present

## 2021-08-29 DIAGNOSIS — F0284 Dementia in other diseases classified elsewhere, unspecified severity, with anxiety: Secondary | ICD-10-CM | POA: Diagnosis not present

## 2021-08-29 DIAGNOSIS — I10 Essential (primary) hypertension: Secondary | ICD-10-CM | POA: Diagnosis not present

## 2021-08-29 DIAGNOSIS — I959 Hypotension, unspecified: Secondary | ICD-10-CM | POA: Diagnosis not present

## 2021-08-29 DIAGNOSIS — J45909 Unspecified asthma, uncomplicated: Secondary | ICD-10-CM | POA: Diagnosis not present

## 2021-08-30 DIAGNOSIS — G3183 Dementia with Lewy bodies: Secondary | ICD-10-CM | POA: Diagnosis not present

## 2021-08-30 DIAGNOSIS — I10 Essential (primary) hypertension: Secondary | ICD-10-CM | POA: Diagnosis not present

## 2021-08-30 DIAGNOSIS — I959 Hypotension, unspecified: Secondary | ICD-10-CM | POA: Diagnosis not present

## 2021-08-30 DIAGNOSIS — F0284 Dementia in other diseases classified elsewhere, unspecified severity, with anxiety: Secondary | ICD-10-CM | POA: Diagnosis not present

## 2021-08-30 DIAGNOSIS — E785 Hyperlipidemia, unspecified: Secondary | ICD-10-CM | POA: Diagnosis not present

## 2021-08-30 DIAGNOSIS — J45909 Unspecified asthma, uncomplicated: Secondary | ICD-10-CM | POA: Diagnosis not present

## 2021-09-03 DIAGNOSIS — I959 Hypotension, unspecified: Secondary | ICD-10-CM | POA: Diagnosis not present

## 2021-09-03 DIAGNOSIS — E785 Hyperlipidemia, unspecified: Secondary | ICD-10-CM | POA: Diagnosis not present

## 2021-09-03 DIAGNOSIS — I10 Essential (primary) hypertension: Secondary | ICD-10-CM | POA: Diagnosis not present

## 2021-09-03 DIAGNOSIS — G3183 Dementia with Lewy bodies: Secondary | ICD-10-CM | POA: Diagnosis not present

## 2021-09-03 DIAGNOSIS — J45909 Unspecified asthma, uncomplicated: Secondary | ICD-10-CM | POA: Diagnosis not present

## 2021-09-03 DIAGNOSIS — F0284 Dementia in other diseases classified elsewhere, unspecified severity, with anxiety: Secondary | ICD-10-CM | POA: Diagnosis not present

## 2021-09-04 DIAGNOSIS — I10 Essential (primary) hypertension: Secondary | ICD-10-CM | POA: Diagnosis not present

## 2021-09-04 DIAGNOSIS — I959 Hypotension, unspecified: Secondary | ICD-10-CM | POA: Diagnosis not present

## 2021-09-04 DIAGNOSIS — G3183 Dementia with Lewy bodies: Secondary | ICD-10-CM | POA: Diagnosis not present

## 2021-09-04 DIAGNOSIS — F0284 Dementia in other diseases classified elsewhere, unspecified severity, with anxiety: Secondary | ICD-10-CM | POA: Diagnosis not present

## 2021-09-04 DIAGNOSIS — J45909 Unspecified asthma, uncomplicated: Secondary | ICD-10-CM | POA: Diagnosis not present

## 2021-09-04 DIAGNOSIS — E785 Hyperlipidemia, unspecified: Secondary | ICD-10-CM | POA: Diagnosis not present

## 2021-09-05 DIAGNOSIS — E785 Hyperlipidemia, unspecified: Secondary | ICD-10-CM | POA: Diagnosis not present

## 2021-09-05 DIAGNOSIS — J45909 Unspecified asthma, uncomplicated: Secondary | ICD-10-CM | POA: Diagnosis not present

## 2021-09-05 DIAGNOSIS — G3183 Dementia with Lewy bodies: Secondary | ICD-10-CM | POA: Diagnosis not present

## 2021-09-05 DIAGNOSIS — I959 Hypotension, unspecified: Secondary | ICD-10-CM | POA: Diagnosis not present

## 2021-09-05 DIAGNOSIS — F0284 Dementia in other diseases classified elsewhere, unspecified severity, with anxiety: Secondary | ICD-10-CM | POA: Diagnosis not present

## 2021-09-05 DIAGNOSIS — I10 Essential (primary) hypertension: Secondary | ICD-10-CM | POA: Diagnosis not present

## 2021-09-06 DIAGNOSIS — E785 Hyperlipidemia, unspecified: Secondary | ICD-10-CM | POA: Diagnosis not present

## 2021-09-06 DIAGNOSIS — I959 Hypotension, unspecified: Secondary | ICD-10-CM | POA: Diagnosis not present

## 2021-09-06 DIAGNOSIS — J45909 Unspecified asthma, uncomplicated: Secondary | ICD-10-CM | POA: Diagnosis not present

## 2021-09-06 DIAGNOSIS — F0284 Dementia in other diseases classified elsewhere, unspecified severity, with anxiety: Secondary | ICD-10-CM | POA: Diagnosis not present

## 2021-09-06 DIAGNOSIS — G3183 Dementia with Lewy bodies: Secondary | ICD-10-CM | POA: Diagnosis not present

## 2021-09-06 DIAGNOSIS — I10 Essential (primary) hypertension: Secondary | ICD-10-CM | POA: Diagnosis not present

## 2021-09-10 DIAGNOSIS — F0284 Dementia in other diseases classified elsewhere, unspecified severity, with anxiety: Secondary | ICD-10-CM | POA: Diagnosis not present

## 2021-09-10 DIAGNOSIS — G3183 Dementia with Lewy bodies: Secondary | ICD-10-CM | POA: Diagnosis not present

## 2021-09-10 DIAGNOSIS — I959 Hypotension, unspecified: Secondary | ICD-10-CM | POA: Diagnosis not present

## 2021-09-10 DIAGNOSIS — E785 Hyperlipidemia, unspecified: Secondary | ICD-10-CM | POA: Diagnosis not present

## 2021-09-10 DIAGNOSIS — J45909 Unspecified asthma, uncomplicated: Secondary | ICD-10-CM | POA: Diagnosis not present

## 2021-09-10 DIAGNOSIS — I10 Essential (primary) hypertension: Secondary | ICD-10-CM | POA: Diagnosis not present

## 2021-09-11 DIAGNOSIS — I959 Hypotension, unspecified: Secondary | ICD-10-CM | POA: Diagnosis not present

## 2021-09-11 DIAGNOSIS — G3183 Dementia with Lewy bodies: Secondary | ICD-10-CM | POA: Diagnosis not present

## 2021-09-11 DIAGNOSIS — E785 Hyperlipidemia, unspecified: Secondary | ICD-10-CM | POA: Diagnosis not present

## 2021-09-11 DIAGNOSIS — J45909 Unspecified asthma, uncomplicated: Secondary | ICD-10-CM | POA: Diagnosis not present

## 2021-09-11 DIAGNOSIS — I10 Essential (primary) hypertension: Secondary | ICD-10-CM | POA: Diagnosis not present

## 2021-09-11 DIAGNOSIS — F0284 Dementia in other diseases classified elsewhere, unspecified severity, with anxiety: Secondary | ICD-10-CM | POA: Diagnosis not present

## 2021-09-12 DIAGNOSIS — E785 Hyperlipidemia, unspecified: Secondary | ICD-10-CM | POA: Diagnosis not present

## 2021-09-12 DIAGNOSIS — F0284 Dementia in other diseases classified elsewhere, unspecified severity, with anxiety: Secondary | ICD-10-CM | POA: Diagnosis not present

## 2021-09-12 DIAGNOSIS — G3183 Dementia with Lewy bodies: Secondary | ICD-10-CM | POA: Diagnosis not present

## 2021-09-12 DIAGNOSIS — J45909 Unspecified asthma, uncomplicated: Secondary | ICD-10-CM | POA: Diagnosis not present

## 2021-09-12 DIAGNOSIS — I10 Essential (primary) hypertension: Secondary | ICD-10-CM | POA: Diagnosis not present

## 2021-09-12 DIAGNOSIS — I959 Hypotension, unspecified: Secondary | ICD-10-CM | POA: Diagnosis not present

## 2021-09-13 DIAGNOSIS — J45909 Unspecified asthma, uncomplicated: Secondary | ICD-10-CM | POA: Diagnosis not present

## 2021-09-13 DIAGNOSIS — E785 Hyperlipidemia, unspecified: Secondary | ICD-10-CM | POA: Diagnosis not present

## 2021-09-13 DIAGNOSIS — G3183 Dementia with Lewy bodies: Secondary | ICD-10-CM | POA: Diagnosis not present

## 2021-09-13 DIAGNOSIS — F0284 Dementia in other diseases classified elsewhere, unspecified severity, with anxiety: Secondary | ICD-10-CM | POA: Diagnosis not present

## 2021-09-13 DIAGNOSIS — I959 Hypotension, unspecified: Secondary | ICD-10-CM | POA: Diagnosis not present

## 2021-09-13 DIAGNOSIS — I10 Essential (primary) hypertension: Secondary | ICD-10-CM | POA: Diagnosis not present

## 2021-09-17 DIAGNOSIS — R634 Abnormal weight loss: Secondary | ICD-10-CM | POA: Diagnosis not present

## 2021-09-17 DIAGNOSIS — I959 Hypotension, unspecified: Secondary | ICD-10-CM | POA: Diagnosis not present

## 2021-09-17 DIAGNOSIS — R627 Adult failure to thrive: Secondary | ICD-10-CM | POA: Diagnosis not present

## 2021-09-17 DIAGNOSIS — E785 Hyperlipidemia, unspecified: Secondary | ICD-10-CM | POA: Diagnosis not present

## 2021-09-17 DIAGNOSIS — M797 Fibromyalgia: Secondary | ICD-10-CM | POA: Diagnosis not present

## 2021-09-17 DIAGNOSIS — G20 Parkinson's disease: Secondary | ICD-10-CM | POA: Diagnosis not present

## 2021-09-17 DIAGNOSIS — J309 Allergic rhinitis, unspecified: Secondary | ICD-10-CM | POA: Diagnosis not present

## 2021-09-17 DIAGNOSIS — J45909 Unspecified asthma, uncomplicated: Secondary | ICD-10-CM | POA: Diagnosis not present

## 2021-09-17 DIAGNOSIS — Z8673 Personal history of transient ischemic attack (TIA), and cerebral infarction without residual deficits: Secondary | ICD-10-CM | POA: Diagnosis not present

## 2021-09-17 DIAGNOSIS — H269 Unspecified cataract: Secondary | ICD-10-CM | POA: Diagnosis not present

## 2021-09-17 DIAGNOSIS — K219 Gastro-esophageal reflux disease without esophagitis: Secondary | ICD-10-CM | POA: Diagnosis not present

## 2021-09-17 DIAGNOSIS — F0284 Dementia in other diseases classified elsewhere, unspecified severity, with anxiety: Secondary | ICD-10-CM | POA: Diagnosis not present

## 2021-09-17 DIAGNOSIS — I10 Essential (primary) hypertension: Secondary | ICD-10-CM | POA: Diagnosis not present

## 2021-09-17 DIAGNOSIS — Z6820 Body mass index (BMI) 20.0-20.9, adult: Secondary | ICD-10-CM | POA: Diagnosis not present

## 2021-09-17 DIAGNOSIS — G4700 Insomnia, unspecified: Secondary | ICD-10-CM | POA: Diagnosis not present

## 2021-09-17 DIAGNOSIS — E559 Vitamin D deficiency, unspecified: Secondary | ICD-10-CM | POA: Diagnosis not present

## 2021-09-17 DIAGNOSIS — G3183 Dementia with Lewy bodies: Secondary | ICD-10-CM | POA: Diagnosis not present

## 2021-09-17 DIAGNOSIS — M1990 Unspecified osteoarthritis, unspecified site: Secondary | ICD-10-CM | POA: Diagnosis not present

## 2021-09-17 DIAGNOSIS — F259 Schizoaffective disorder, unspecified: Secondary | ICD-10-CM | POA: Diagnosis not present

## 2021-09-17 DIAGNOSIS — R1310 Dysphagia, unspecified: Secondary | ICD-10-CM | POA: Diagnosis not present

## 2021-09-17 DIAGNOSIS — F0283 Dementia in other diseases classified elsewhere, unspecified severity, with mood disturbance: Secondary | ICD-10-CM | POA: Diagnosis not present

## 2021-09-17 DIAGNOSIS — Z8619 Personal history of other infectious and parasitic diseases: Secondary | ICD-10-CM | POA: Diagnosis not present

## 2021-09-18 DIAGNOSIS — I10 Essential (primary) hypertension: Secondary | ICD-10-CM | POA: Diagnosis not present

## 2021-09-18 DIAGNOSIS — F0284 Dementia in other diseases classified elsewhere, unspecified severity, with anxiety: Secondary | ICD-10-CM | POA: Diagnosis not present

## 2021-09-18 DIAGNOSIS — J45909 Unspecified asthma, uncomplicated: Secondary | ICD-10-CM | POA: Diagnosis not present

## 2021-09-18 DIAGNOSIS — I959 Hypotension, unspecified: Secondary | ICD-10-CM | POA: Diagnosis not present

## 2021-09-18 DIAGNOSIS — E785 Hyperlipidemia, unspecified: Secondary | ICD-10-CM | POA: Diagnosis not present

## 2021-09-18 DIAGNOSIS — G3183 Dementia with Lewy bodies: Secondary | ICD-10-CM | POA: Diagnosis not present

## 2021-09-19 DIAGNOSIS — I10 Essential (primary) hypertension: Secondary | ICD-10-CM | POA: Diagnosis not present

## 2021-09-19 DIAGNOSIS — F0284 Dementia in other diseases classified elsewhere, unspecified severity, with anxiety: Secondary | ICD-10-CM | POA: Diagnosis not present

## 2021-09-19 DIAGNOSIS — I959 Hypotension, unspecified: Secondary | ICD-10-CM | POA: Diagnosis not present

## 2021-09-19 DIAGNOSIS — J45909 Unspecified asthma, uncomplicated: Secondary | ICD-10-CM | POA: Diagnosis not present

## 2021-09-19 DIAGNOSIS — G3183 Dementia with Lewy bodies: Secondary | ICD-10-CM | POA: Diagnosis not present

## 2021-09-19 DIAGNOSIS — E785 Hyperlipidemia, unspecified: Secondary | ICD-10-CM | POA: Diagnosis not present

## 2021-09-20 DIAGNOSIS — J45909 Unspecified asthma, uncomplicated: Secondary | ICD-10-CM | POA: Diagnosis not present

## 2021-09-20 DIAGNOSIS — I10 Essential (primary) hypertension: Secondary | ICD-10-CM | POA: Diagnosis not present

## 2021-09-20 DIAGNOSIS — F0284 Dementia in other diseases classified elsewhere, unspecified severity, with anxiety: Secondary | ICD-10-CM | POA: Diagnosis not present

## 2021-09-20 DIAGNOSIS — E785 Hyperlipidemia, unspecified: Secondary | ICD-10-CM | POA: Diagnosis not present

## 2021-09-20 DIAGNOSIS — I959 Hypotension, unspecified: Secondary | ICD-10-CM | POA: Diagnosis not present

## 2021-09-20 DIAGNOSIS — G3183 Dementia with Lewy bodies: Secondary | ICD-10-CM | POA: Diagnosis not present

## 2021-09-24 DIAGNOSIS — F0284 Dementia in other diseases classified elsewhere, unspecified severity, with anxiety: Secondary | ICD-10-CM | POA: Diagnosis not present

## 2021-09-24 DIAGNOSIS — I10 Essential (primary) hypertension: Secondary | ICD-10-CM | POA: Diagnosis not present

## 2021-09-24 DIAGNOSIS — E785 Hyperlipidemia, unspecified: Secondary | ICD-10-CM | POA: Diagnosis not present

## 2021-09-24 DIAGNOSIS — J45909 Unspecified asthma, uncomplicated: Secondary | ICD-10-CM | POA: Diagnosis not present

## 2021-09-24 DIAGNOSIS — G3183 Dementia with Lewy bodies: Secondary | ICD-10-CM | POA: Diagnosis not present

## 2021-09-24 DIAGNOSIS — I959 Hypotension, unspecified: Secondary | ICD-10-CM | POA: Diagnosis not present

## 2021-09-25 DIAGNOSIS — I10 Essential (primary) hypertension: Secondary | ICD-10-CM | POA: Diagnosis not present

## 2021-09-25 DIAGNOSIS — E785 Hyperlipidemia, unspecified: Secondary | ICD-10-CM | POA: Diagnosis not present

## 2021-09-25 DIAGNOSIS — F0284 Dementia in other diseases classified elsewhere, unspecified severity, with anxiety: Secondary | ICD-10-CM | POA: Diagnosis not present

## 2021-09-25 DIAGNOSIS — I959 Hypotension, unspecified: Secondary | ICD-10-CM | POA: Diagnosis not present

## 2021-09-25 DIAGNOSIS — G3183 Dementia with Lewy bodies: Secondary | ICD-10-CM | POA: Diagnosis not present

## 2021-09-25 DIAGNOSIS — J45909 Unspecified asthma, uncomplicated: Secondary | ICD-10-CM | POA: Diagnosis not present

## 2021-09-26 DIAGNOSIS — I959 Hypotension, unspecified: Secondary | ICD-10-CM | POA: Diagnosis not present

## 2021-09-26 DIAGNOSIS — I10 Essential (primary) hypertension: Secondary | ICD-10-CM | POA: Diagnosis not present

## 2021-09-26 DIAGNOSIS — J45909 Unspecified asthma, uncomplicated: Secondary | ICD-10-CM | POA: Diagnosis not present

## 2021-09-26 DIAGNOSIS — G3183 Dementia with Lewy bodies: Secondary | ICD-10-CM | POA: Diagnosis not present

## 2021-09-26 DIAGNOSIS — E785 Hyperlipidemia, unspecified: Secondary | ICD-10-CM | POA: Diagnosis not present

## 2021-09-26 DIAGNOSIS — F0284 Dementia in other diseases classified elsewhere, unspecified severity, with anxiety: Secondary | ICD-10-CM | POA: Diagnosis not present

## 2021-09-27 DIAGNOSIS — F0284 Dementia in other diseases classified elsewhere, unspecified severity, with anxiety: Secondary | ICD-10-CM | POA: Diagnosis not present

## 2021-09-27 DIAGNOSIS — E785 Hyperlipidemia, unspecified: Secondary | ICD-10-CM | POA: Diagnosis not present

## 2021-09-27 DIAGNOSIS — I959 Hypotension, unspecified: Secondary | ICD-10-CM | POA: Diagnosis not present

## 2021-09-27 DIAGNOSIS — G3183 Dementia with Lewy bodies: Secondary | ICD-10-CM | POA: Diagnosis not present

## 2021-09-27 DIAGNOSIS — J45909 Unspecified asthma, uncomplicated: Secondary | ICD-10-CM | POA: Diagnosis not present

## 2021-09-27 DIAGNOSIS — I10 Essential (primary) hypertension: Secondary | ICD-10-CM | POA: Diagnosis not present

## 2021-10-01 DIAGNOSIS — I10 Essential (primary) hypertension: Secondary | ICD-10-CM | POA: Diagnosis not present

## 2021-10-01 DIAGNOSIS — J45909 Unspecified asthma, uncomplicated: Secondary | ICD-10-CM | POA: Diagnosis not present

## 2021-10-01 DIAGNOSIS — E785 Hyperlipidemia, unspecified: Secondary | ICD-10-CM | POA: Diagnosis not present

## 2021-10-01 DIAGNOSIS — G3183 Dementia with Lewy bodies: Secondary | ICD-10-CM | POA: Diagnosis not present

## 2021-10-01 DIAGNOSIS — I959 Hypotension, unspecified: Secondary | ICD-10-CM | POA: Diagnosis not present

## 2021-10-01 DIAGNOSIS — F0284 Dementia in other diseases classified elsewhere, unspecified severity, with anxiety: Secondary | ICD-10-CM | POA: Diagnosis not present

## 2021-10-02 DIAGNOSIS — E785 Hyperlipidemia, unspecified: Secondary | ICD-10-CM | POA: Diagnosis not present

## 2021-10-02 DIAGNOSIS — I10 Essential (primary) hypertension: Secondary | ICD-10-CM | POA: Diagnosis not present

## 2021-10-02 DIAGNOSIS — J45909 Unspecified asthma, uncomplicated: Secondary | ICD-10-CM | POA: Diagnosis not present

## 2021-10-02 DIAGNOSIS — G3183 Dementia with Lewy bodies: Secondary | ICD-10-CM | POA: Diagnosis not present

## 2021-10-02 DIAGNOSIS — F0284 Dementia in other diseases classified elsewhere, unspecified severity, with anxiety: Secondary | ICD-10-CM | POA: Diagnosis not present

## 2021-10-02 DIAGNOSIS — I959 Hypotension, unspecified: Secondary | ICD-10-CM | POA: Diagnosis not present

## 2021-10-04 DIAGNOSIS — I959 Hypotension, unspecified: Secondary | ICD-10-CM | POA: Diagnosis not present

## 2021-10-04 DIAGNOSIS — E785 Hyperlipidemia, unspecified: Secondary | ICD-10-CM | POA: Diagnosis not present

## 2021-10-04 DIAGNOSIS — J45909 Unspecified asthma, uncomplicated: Secondary | ICD-10-CM | POA: Diagnosis not present

## 2021-10-04 DIAGNOSIS — I10 Essential (primary) hypertension: Secondary | ICD-10-CM | POA: Diagnosis not present

## 2021-10-04 DIAGNOSIS — G3183 Dementia with Lewy bodies: Secondary | ICD-10-CM | POA: Diagnosis not present

## 2021-10-04 DIAGNOSIS — F0284 Dementia in other diseases classified elsewhere, unspecified severity, with anxiety: Secondary | ICD-10-CM | POA: Diagnosis not present

## 2021-10-07 ENCOUNTER — Non-Acute Institutional Stay (SKILLED_NURSING_FACILITY): Payer: Medicare Other | Admitting: Adult Health

## 2021-10-07 ENCOUNTER — Encounter: Payer: Self-pay | Admitting: Adult Health

## 2021-10-07 DIAGNOSIS — F0150 Vascular dementia without behavioral disturbance: Secondary | ICD-10-CM

## 2021-10-07 DIAGNOSIS — R1312 Dysphagia, oropharyngeal phase: Secondary | ICD-10-CM | POA: Diagnosis not present

## 2021-10-07 NOTE — Progress Notes (Signed)
Location:  [place] Room Number: [place]:  SNF (31) Provider:  Durenda Age, DNP, FNP-BC  Patient Care Team: Hendricks Limes, MD as PCP - General (Internal Medicine) Center, [place] [facility]) Rolm Baptise as Physician Assistant (Internal Medicine)  Extended Emergency Contact Information Primary Emergency Contact: Vivia [place] of [place] Phone: [phone] Relation: Daughter Secondary Emergency Contact: Merleen Milliner [place] of [place] Phone: [phone] Relation: Daughter  Code Status:  DNR  Goals of care: Advanced Directive information Advanced Directives 10/07/2021  Does Patient Have a Medical Advance Directive? Yes  Type of Advance Directive Out of facility DNR (pink MOST or yellow form)  Does patient want to make changes to medical advance directive? -  Copy of [place] in Chart? -  Would patient like information on creating a medical advance directive? -  Pre-existing out of facility DNR order (yellow form or pink MOST form) -     Chief Complaint  Patient presents with   Acute Visit    Difficulty swallowing    HPI:  Pt is a 75 y.o. female seen today for an acute visit. She is a long-term care of [facility]. She was reported by staff to have difficulty swallowing. She is currently on pureed diet.She was seen in her room today with charge nurse. Charge nurse reported that she is able to swallow without difficulty. She ate 50% of her lunch today. Latest weight is 114.8 lbs, - 1.37% in 30 days, down 6.51% in 93 days and 11.83% in 184 days. She is currently followed by hospice for failure to thrive.   Past Medical History:  Diagnosis Date   Allergy    Anxiety    Arthritis    Asthma    Benign essential hypertension 03/18/2017   Cataract    Dementia (HCC)    Dementia with Lewy bodies (CODE)    Depression     Difficulty walking    Dysphagia, oral phase    Fibromyalgia    GERD (gastroesophageal reflux disease)    Hallucinations    Hyperlipidemia    Lower extremity edema    Repeated falls    Rhabdomyolysis    Stroke (HCC)    TIA (transient ischemic attack)    Urinary incontinence    Past Surgical History:  Procedure Laterality Date   ABDOMINAL HYSTERECTOMY     CATARACT EXTRACTION     CHOLECYSTECTOMY     TONSILLECTOMY AND ADENOIDECTOMY      Allergies  Allergen Reactions   Hydrocodone Other (See Comments)    GI upset    Outpatient Encounter Medications as of 10/07/2021  Medication Sig   acetaminophen (TYLENOL) 325 MG tablet Take 650 mg by mouth See admin instructions. 650 BID PRN and 650 BID scheduled   albuterol (PROVENTIL HFA;VENTOLIN HFA) 108 (90 Base) MCG/ACT inhaler Inhale 2 puffs into the lungs every 6 (six) hours as needed for wheezing or shortness of breath.   aspirin EC 81 MG tablet Take 81 mg by mouth daily.   bisacodyl (DULCOLAX) 10 MG suppository Place 10 mg rectally as needed for moderate constipation.   busPIRone (BUSPAR) 15 MG tablet Take 15 mg by mouth 3 (three) times daily.   carbidopa-levodopa (SINEMET IR) 25-100 MG tablet Take 1 tablet by mouth 3 (three) times daily.   chlorhexidine gluconate, MEDLINE KIT, (PERIDEX) 0.12 % solution Use as directed 15 mLs in the mouth or throat 2 (two)  times daily. BRUSH ON ONE TEASPOON OF SOLUTIONTO TEETH AND GUMS WITH A TOOTHBRUSH. SPIT OUT EXCESS AND DO NOT RINSE   cholecalciferol (VITAMIN D) 1000 units tablet Take 1,000 Units by mouth daily.    donepezil (ARICEPT) 5 MG tablet Take 5 mg by mouth at bedtime.   DULoxetine (CYMBALTA) 30 MG capsule Give 3 tablets ($RemoveBe'90mg'doSBIPMrC$ ) by mouth daily   Ensure (ENSURE) Take 237 mLs by mouth.   Eyelid Cleansers (OCUSOFT LID SCRUB EX) Apply 1 application topically 2 (two) times daily. Both eye and rinse thoroughly   fluticasone (FLONASE) 50 MCG/ACT nasal spray Place 2 sprays into both nostrils daily.     LITHIUM CARBONATE PO Take 450 mg by mouth at bedtime.   magnesium hydroxide (MILK OF MAGNESIA) 400 MG/5ML suspension If no BM in 3 days, give 30 cc Milk of Magnesium p.o. x 1 dose in 24 hours as needed (Do not use standing constipation orders for residents with renal failure CFR less than 30. Contact MD for orders) (Physician Order)   memantine (NAMENDA) 10 MG tablet Take 10 mg by mouth 2 (two) times daily.   midodrine (PROAMATINE) 2.5 MG tablet Take 1 tablet (2.5 mg total) by mouth 2 (two) times daily as needed. For SBP <=95   pantoprazole (PROTONIX) 40 MG tablet Take 40 mg by mouth daily.    polyethylene glycol (MIRALAX / GLYCOLAX) packet Take 17 g by mouth daily as needed.    sodium fluoride (PREVIDENT 5000 PLUS) 1.1 % CREA dental cream Place 1 application onto teeth at bedtime.    Sodium Phosphates (RA SALINE ENEMA RE) If not relieved by Biscodyl suppository, give disposable Saline Enema rectally X 1 dose/24 hrs as needed (Do not use constipation standing orders for residents with renal failure/CFR less than 30. Contact MD for orders)(Physician Or   ziprasidone (GEODON) 80 MG capsule Take 80 mg by mouth 2 (two) times daily.    [DISCONTINUED] LORazepam (ATIVAN) 1 MG tablet Take 1 tablet (1 mg total) by mouth every 12 (twelve) hours.   [DISCONTINUED] Nutritional Supplement LIQD Take 120 mLs by mouth 3 (three) times daily. MedPass   No facility-administered encounter medications on file as of 10/07/2021.    Review of Systems  GENERAL: No fever or chills  MOUTH and THROAT: Denies oral discomfort, gingival pain or bleeding RESPIRATORY: no cough, SOB, DOE, wheezing, hemoptysis CARDIAC: No chest pain, edema or palpitations GI: No abdominal pain, diarrhea, constipation, heart burn, nausea or vomiting GU: Denies dysuria, frequency, hematuria or discharge NEUROLOGICAL: Denies dizziness, syncope or headache PSYCHIATRIC: Denies feelings of depression or anxiety.   Immunization History   Administered Date(s) Administered   Influenza-Unspecified 03/17/2017, 08/17/2018, 08/29/2019, 08/28/2020   Moderna Sars-Covid-2 Vaccination 01/02/2020, 01/30/2020   PPD Test 03/24/2017, 06/02/2018, 06/08/2018   Pneumococcal Polysaccharide-23 03/17/2017, 06/12/2018   Pertinent  Health Maintenance Due  Topic Date Due   DEXA SCAN  Never done   INFLUENZA VACCINE  06/17/2021   Fall Risk 05/14/2017 05/27/2018 12/17/2018  Falls in the past year? Yes No -  Was there an injury with Fall? No - -  Patient Fall Risk Level - - High fall risk     Vitals:   10/07/21 1617  BP: 104/63  Pulse: (!) 54  Resp: 17  Temp: (!) 97.5 F (36.4 C)  Height: $Remove'5\' 5"'vbMzmgA$  (1.651 m)   Body mass index is 19.37 kg/m.  Physical Exam  GENERAL APPEARANCE:  In no acute distress.  SKIN:  Skin is warm and dry.  MOUTH and  THROAT: Lips are without lesions. Oral mucosa is moist and without lesions.  RESPIRATORY: Breathing is even & unlabored, BS CTAB CARDIAC: RRR, no murmur,no extra heart sounds, no edema GI: Abdomen soft, normal BS, no masses, no tenderness NEUROLOGICAL: +tremor. Speech is clear. Alert to self, disoriented to time and place. PSYCHIATRIC:  Affect and behavior are appropriate  Labs reviewed: Recent Labs    01/16/21 0000 05/24/21 0000  NA 140 139  K 3.9 4.3  CL 104 104  CO2 27* 24*  BUN 15 15  CREATININE 0.6 0.6  CALCIUM 9.5 9.6   Recent Labs    01/16/21 0000  AST 14  ALT 9  ALKPHOS 63  ALBUMIN 4.2   Recent Labs    01/16/21 0000 05/24/21 0000  WBC 4.7 4.8  HGB 12.7 12.7  HCT 37 36  PLT 250 239   Lab Results  Component Value Date   TSH 4.07 01/30/2021   Lab Results  Component Value Date   HGBA1C 5.4 03/15/2018   Lab Results  Component Value Date   CHOL 139 03/15/2018   HDL 46 03/15/2018   LDLCALC 74 03/15/2018   TRIG 97 03/15/2018    Significant Diagnostic Results in last 30 days:  No results found.  Assessment/Plan  1. Oropharyngeal dysphagia -  currently on  pureed diet, will recommend for speech therapy evaluation  -  resident followed by hospice -  staff to continue to assist her with feedings -  aspiration precautions  2.  Vascular dementia without behavioral disturbance -  BIMS score 9/15, ranging in moderate cognitive impairment -  continue Aricept 5 mg at bedtime and Namenda 10 mg twice a day   Family/ staff Communication: discussed plan of care with resident and charge nurse.  Labs/tests ordered:  None  Goals of care:   Long-term care/Hospice care   Durenda Age, DNP, MSN, FNP-BC [facility] and Adult [facility] [phone] (Monday-Friday 8:00 a.m. - 5:00 p.m.) [phone] (after hours)

## 2021-10-08 DIAGNOSIS — J45909 Unspecified asthma, uncomplicated: Secondary | ICD-10-CM | POA: Diagnosis not present

## 2021-10-08 DIAGNOSIS — G3183 Dementia with Lewy bodies: Secondary | ICD-10-CM | POA: Diagnosis not present

## 2021-10-08 DIAGNOSIS — I10 Essential (primary) hypertension: Secondary | ICD-10-CM | POA: Diagnosis not present

## 2021-10-08 DIAGNOSIS — F0284 Dementia in other diseases classified elsewhere, unspecified severity, with anxiety: Secondary | ICD-10-CM | POA: Diagnosis not present

## 2021-10-08 DIAGNOSIS — E785 Hyperlipidemia, unspecified: Secondary | ICD-10-CM | POA: Diagnosis not present

## 2021-10-08 DIAGNOSIS — I959 Hypotension, unspecified: Secondary | ICD-10-CM | POA: Diagnosis not present

## 2021-10-09 ENCOUNTER — Other Ambulatory Visit: Payer: Self-pay | Admitting: Adult Health

## 2021-10-09 DIAGNOSIS — E785 Hyperlipidemia, unspecified: Secondary | ICD-10-CM | POA: Diagnosis not present

## 2021-10-09 DIAGNOSIS — F419 Anxiety disorder, unspecified: Secondary | ICD-10-CM

## 2021-10-09 DIAGNOSIS — G3183 Dementia with Lewy bodies: Secondary | ICD-10-CM | POA: Diagnosis not present

## 2021-10-09 DIAGNOSIS — I959 Hypotension, unspecified: Secondary | ICD-10-CM | POA: Diagnosis not present

## 2021-10-09 DIAGNOSIS — J45909 Unspecified asthma, uncomplicated: Secondary | ICD-10-CM | POA: Diagnosis not present

## 2021-10-09 DIAGNOSIS — F0284 Dementia in other diseases classified elsewhere, unspecified severity, with anxiety: Secondary | ICD-10-CM | POA: Diagnosis not present

## 2021-10-09 DIAGNOSIS — I10 Essential (primary) hypertension: Secondary | ICD-10-CM | POA: Diagnosis not present

## 2021-10-09 MED ORDER — LORAZEPAM 1 MG PO TABS: 1.0000 mg | ORAL_TABLET | Freq: Two times a day (BID) | ORAL | 0 refills | Status: DC

## 2021-10-11 DIAGNOSIS — G3183 Dementia with Lewy bodies: Secondary | ICD-10-CM | POA: Diagnosis not present

## 2021-10-11 DIAGNOSIS — F0284 Dementia in other diseases classified elsewhere, unspecified severity, with anxiety: Secondary | ICD-10-CM | POA: Diagnosis not present

## 2021-10-11 DIAGNOSIS — I10 Essential (primary) hypertension: Secondary | ICD-10-CM | POA: Diagnosis not present

## 2021-10-11 DIAGNOSIS — J45909 Unspecified asthma, uncomplicated: Secondary | ICD-10-CM | POA: Diagnosis not present

## 2021-10-11 DIAGNOSIS — I959 Hypotension, unspecified: Secondary | ICD-10-CM | POA: Diagnosis not present

## 2021-10-11 DIAGNOSIS — E785 Hyperlipidemia, unspecified: Secondary | ICD-10-CM | POA: Diagnosis not present

## 2021-10-14 DIAGNOSIS — J45909 Unspecified asthma, uncomplicated: Secondary | ICD-10-CM | POA: Diagnosis not present

## 2021-10-14 DIAGNOSIS — E785 Hyperlipidemia, unspecified: Secondary | ICD-10-CM | POA: Diagnosis not present

## 2021-10-14 DIAGNOSIS — I10 Essential (primary) hypertension: Secondary | ICD-10-CM | POA: Diagnosis not present

## 2021-10-14 DIAGNOSIS — F0284 Dementia in other diseases classified elsewhere, unspecified severity, with anxiety: Secondary | ICD-10-CM | POA: Diagnosis not present

## 2021-10-14 DIAGNOSIS — G3183 Dementia with Lewy bodies: Secondary | ICD-10-CM | POA: Diagnosis not present

## 2021-10-14 DIAGNOSIS — I959 Hypotension, unspecified: Secondary | ICD-10-CM | POA: Diagnosis not present

## 2021-10-15 DIAGNOSIS — I10 Essential (primary) hypertension: Secondary | ICD-10-CM | POA: Diagnosis not present

## 2021-10-15 DIAGNOSIS — E785 Hyperlipidemia, unspecified: Secondary | ICD-10-CM | POA: Diagnosis not present

## 2021-10-15 DIAGNOSIS — I959 Hypotension, unspecified: Secondary | ICD-10-CM | POA: Diagnosis not present

## 2021-10-15 DIAGNOSIS — G3183 Dementia with Lewy bodies: Secondary | ICD-10-CM | POA: Diagnosis not present

## 2021-10-15 DIAGNOSIS — J45909 Unspecified asthma, uncomplicated: Secondary | ICD-10-CM | POA: Diagnosis not present

## 2021-10-15 DIAGNOSIS — F0284 Dementia in other diseases classified elsewhere, unspecified severity, with anxiety: Secondary | ICD-10-CM | POA: Diagnosis not present

## 2021-10-16 DIAGNOSIS — J45909 Unspecified asthma, uncomplicated: Secondary | ICD-10-CM | POA: Diagnosis not present

## 2021-10-16 DIAGNOSIS — I10 Essential (primary) hypertension: Secondary | ICD-10-CM | POA: Diagnosis not present

## 2021-10-16 DIAGNOSIS — E785 Hyperlipidemia, unspecified: Secondary | ICD-10-CM | POA: Diagnosis not present

## 2021-10-16 DIAGNOSIS — I959 Hypotension, unspecified: Secondary | ICD-10-CM | POA: Diagnosis not present

## 2021-10-16 DIAGNOSIS — G3183 Dementia with Lewy bodies: Secondary | ICD-10-CM | POA: Diagnosis not present

## 2021-10-16 DIAGNOSIS — F0284 Dementia in other diseases classified elsewhere, unspecified severity, with anxiety: Secondary | ICD-10-CM | POA: Diagnosis not present

## 2021-10-17 ENCOUNTER — Non-Acute Institutional Stay (SKILLED_NURSING_FACILITY): Payer: Medicare Other | Admitting: Internal Medicine

## 2021-10-17 ENCOUNTER — Encounter: Payer: Self-pay | Admitting: Internal Medicine

## 2021-10-17 DIAGNOSIS — Z8673 Personal history of transient ischemic attack (TIA), and cerebral infarction without residual deficits: Secondary | ICD-10-CM | POA: Diagnosis not present

## 2021-10-17 DIAGNOSIS — R1310 Dysphagia, unspecified: Secondary | ICD-10-CM | POA: Diagnosis not present

## 2021-10-17 DIAGNOSIS — I10 Essential (primary) hypertension: Secondary | ICD-10-CM | POA: Diagnosis not present

## 2021-10-17 DIAGNOSIS — G4700 Insomnia, unspecified: Secondary | ICD-10-CM | POA: Diagnosis not present

## 2021-10-17 DIAGNOSIS — F251 Schizoaffective disorder, depressive type: Secondary | ICD-10-CM

## 2021-10-17 DIAGNOSIS — F259 Schizoaffective disorder, unspecified: Secondary | ICD-10-CM | POA: Diagnosis not present

## 2021-10-17 DIAGNOSIS — Z6820 Body mass index (BMI) 20.0-20.9, adult: Secondary | ICD-10-CM | POA: Diagnosis not present

## 2021-10-17 DIAGNOSIS — J45909 Unspecified asthma, uncomplicated: Secondary | ICD-10-CM | POA: Diagnosis not present

## 2021-10-17 DIAGNOSIS — M1990 Unspecified osteoarthritis, unspecified site: Secondary | ICD-10-CM | POA: Diagnosis not present

## 2021-10-17 DIAGNOSIS — H269 Unspecified cataract: Secondary | ICD-10-CM | POA: Diagnosis not present

## 2021-10-17 DIAGNOSIS — D649 Anemia, unspecified: Secondary | ICD-10-CM | POA: Diagnosis not present

## 2021-10-17 DIAGNOSIS — R627 Adult failure to thrive: Secondary | ICD-10-CM | POA: Diagnosis not present

## 2021-10-17 DIAGNOSIS — I959 Hypotension, unspecified: Secondary | ICD-10-CM | POA: Diagnosis not present

## 2021-10-17 DIAGNOSIS — M797 Fibromyalgia: Secondary | ICD-10-CM | POA: Diagnosis not present

## 2021-10-17 DIAGNOSIS — E559 Vitamin D deficiency, unspecified: Secondary | ICD-10-CM | POA: Diagnosis not present

## 2021-10-17 DIAGNOSIS — F0284 Dementia in other diseases classified elsewhere, unspecified severity, with anxiety: Secondary | ICD-10-CM | POA: Diagnosis not present

## 2021-10-17 DIAGNOSIS — J309 Allergic rhinitis, unspecified: Secondary | ICD-10-CM | POA: Diagnosis not present

## 2021-10-17 DIAGNOSIS — G3183 Dementia with Lewy bodies: Secondary | ICD-10-CM | POA: Diagnosis not present

## 2021-10-17 DIAGNOSIS — F0283 Dementia in other diseases classified elsewhere, unspecified severity, with mood disturbance: Secondary | ICD-10-CM | POA: Diagnosis not present

## 2021-10-17 DIAGNOSIS — R634 Abnormal weight loss: Secondary | ICD-10-CM | POA: Diagnosis not present

## 2021-10-17 DIAGNOSIS — G20 Parkinson's disease: Secondary | ICD-10-CM | POA: Diagnosis not present

## 2021-10-17 DIAGNOSIS — K219 Gastro-esophageal reflux disease without esophagitis: Secondary | ICD-10-CM | POA: Diagnosis not present

## 2021-10-17 DIAGNOSIS — Z8619 Personal history of other infectious and parasitic diseases: Secondary | ICD-10-CM | POA: Diagnosis not present

## 2021-10-17 DIAGNOSIS — E785 Hyperlipidemia, unspecified: Secondary | ICD-10-CM | POA: Diagnosis not present

## 2021-10-17 NOTE — Assessment & Plan Note (Addendum)
Most recent CBC reveals resolution of the anemia. The etiology of black pigment her daughter found on neck pillow is unclear.She is not on oral iron and no scleral icterus or jaundice present. No bleeding dyscrasias reported by Staff

## 2021-10-17 NOTE — Assessment & Plan Note (Addendum)
Failure to thrive is multifactorial but mainly neuropsychiatric in etiology preventing normal interaction, optimal nutrition  and ADLs. Her daughter did state that after being weaned off Geodon; she was able to ambulate and was more active but the debilitating hallucinations returned. At present she remains bed bound & minimally interactive.The only positive is that she is free of the "intensely dark" & debilitating hallucinations as per her daughter.

## 2021-10-17 NOTE — Assessment & Plan Note (Addendum)
Today the daughter reiterates that the patient has had debilitating hallucinations off this complex psychotropic polypharmacy.  She had exacerbation of symptoms even stopping the generic Aricept and Namenda. Hospice is now following the patient; were this not the case I would consult Psychiatry and Pharmacy to reassess the protocol.  Hospice apparently has agreed to pay for the regimen after talking to the daughter.

## 2021-10-17 NOTE — Progress Notes (Signed)
NURSING HOME LOCATION:  [facility] ROOM NUMBER:  [id number]  CODE STATUS:  DNR   PCP:  Douglass Rivers MD  This is a nursing facility follow up visit of chronic medical diagnoses & to document compliance with Regulation 483.30 (c) in The Long Term Care Survey Manual Phase 2 which mandates caregiver visit ( visits can alternate among physician, PA or NP as per statutes) within 10 days of 30 days / 60 days/ 90 days post admission to SNF date    Interim medical record and care since last SNF visit was updated with review of diagnostic studies and change in clinical status since last visit were documented.  HPI: She is a permanent resident of the facility with medical diagnoses of adult failure to thrive, chronic anemia, essential hypertension, dementia w/o defined etiology, hx of fibromyalgia, GERD, dyslipidemia, history of stroke, and schizoaffective disorder. She is on a complex, polypharmacy psychotropic regimen including Geodon 80 mg twice daily, Namenda 10 mg twice daily, lorazepam 1 mg every 12 hours, lithium 450 mg nightly, generic Aricept 5 mg daily, Sinemet 25/100 3 times daily, BuSpar 15 mg 3 times daily and Cymbalta 30 mg daily. This regimen has been prescribed by Psychiatry and Neurology consultants.  Most recent labs reviewed creatinine 0.6 and GFR of 88.93, high stage II CKD. CBC revealed normal white count, hemoglobin, hematocrit, and platelet count.  TSH has been therapeutic within the last 8 months.  Review of systems: The patient is essentially nonverbal and follows commands poorly.  Thankfully her daughter was present to discuss her situation. When the patient first arrived in the facility, I had weaned and discontinued the Sinemet as there was a question of drug-induced Parkinson's rather than Parkinson's disease.  Her tremors actually got worse off the drug prompting restarting it.  The patient was diagnosed with dementia in [place] and generic Namenda and  subsequently Aricept added.  Also bipolar disorder was questioned but never documented.  She did seem to stabilize when low-dose lithium was added by a Psychiatric PA in [place].  Geodon apparently was started in 2017 for hallucinations which the daughter describes as "intensely dark".  Apparently the patient would feel that someone was trying to kill her and that there were people in the walls.  When the Geodon was weaned and discontinued because of the Parkinson's type picture; hallucinations profoundly exacerbated.  Her daughter states that there is no quality of life off this complex program.  She had discussed this with [facility] and they agreed to pay for the drugs. The daughter also questions whether her neuropsychiatric issues were worse after her shingles vaccine.  She states that the patient did not recognize her and had difficulty moving her tongue. Recently black material was noted on the neck pillow she has used for years.  The patient is not on iron supplement.  Physical exam:  Pertinent or positive findings: She appears chronically ill and suboptimally nourished.  Facies are blank even though she will open her eyes and look at the examiner. Oral exam was limited; no pigmentation issues were noted.  Breath sounds are decreased.  Heart sounds are distant.  Pedal pulses are decreased.  There is increased tone in the lower extremities which are crossed at the ankles.  The left great toenail is thickened and deformed.  She has marked interosseous wasting the hands.  She exhibits intermittent myoclonic jerking of the hands, greater on the right.  There is no parkinsonian pill-rolling tremor.  Strength could not be tested as she would not follow commands.  General appearance:  no acute distress, increased work of breathing is present.   Lymphatic: No lymphadenopathy about the head, neck, axilla. Eyes: No conjunctival inflammation or lid edema is present. There is no scleral  icterus. Ears:  External ear exam shows no significant lesions or deformities.   Nose:  External nasal examination shows no deformity or inflammation. Nasal mucosa are pink and moist without lesions, exudates Neck:  No thyromegaly, masses, tenderness noted.    Heart:  No gallop, murmur, click, rub .  Lungs:  without wheezes, rhonchi, rales, rubs. Abdomen: Bowel sounds are normal. Abdomen is soft and nontender with no organomegaly, hernias, masses. GU: Deferred  Extremities:  No cyanosis, clubbing, edema  Neurologic exam :Balance, Rhomberg, finger to nose testing could not be completed due to clinical state Skin: Warm & dry w/o tenting. No jaundice present. No significant lesions or rash.  See summary under each active problem in the Problem List with associated updated therapeutic plan

## 2021-10-17 NOTE — Patient Instructions (Signed)
See assessment and plan under each diagnosis in the problem list and acutely for this visit 

## 2021-10-18 DIAGNOSIS — I959 Hypotension, unspecified: Secondary | ICD-10-CM | POA: Diagnosis not present

## 2021-10-18 DIAGNOSIS — F0284 Dementia in other diseases classified elsewhere, unspecified severity, with anxiety: Secondary | ICD-10-CM | POA: Diagnosis not present

## 2021-10-18 DIAGNOSIS — E785 Hyperlipidemia, unspecified: Secondary | ICD-10-CM | POA: Diagnosis not present

## 2021-10-18 DIAGNOSIS — J45909 Unspecified asthma, uncomplicated: Secondary | ICD-10-CM | POA: Diagnosis not present

## 2021-10-18 DIAGNOSIS — I10 Essential (primary) hypertension: Secondary | ICD-10-CM | POA: Diagnosis not present

## 2021-10-18 DIAGNOSIS — G3183 Dementia with Lewy bodies: Secondary | ICD-10-CM | POA: Diagnosis not present

## 2021-10-22 DIAGNOSIS — E785 Hyperlipidemia, unspecified: Secondary | ICD-10-CM | POA: Diagnosis not present

## 2021-10-22 DIAGNOSIS — J45909 Unspecified asthma, uncomplicated: Secondary | ICD-10-CM | POA: Diagnosis not present

## 2021-10-22 DIAGNOSIS — F0284 Dementia in other diseases classified elsewhere, unspecified severity, with anxiety: Secondary | ICD-10-CM | POA: Diagnosis not present

## 2021-10-22 DIAGNOSIS — I959 Hypotension, unspecified: Secondary | ICD-10-CM | POA: Diagnosis not present

## 2021-10-22 DIAGNOSIS — I10 Essential (primary) hypertension: Secondary | ICD-10-CM | POA: Diagnosis not present

## 2021-10-22 DIAGNOSIS — G3183 Dementia with Lewy bodies: Secondary | ICD-10-CM | POA: Diagnosis not present

## 2021-10-23 DIAGNOSIS — F0284 Dementia in other diseases classified elsewhere, unspecified severity, with anxiety: Secondary | ICD-10-CM | POA: Diagnosis not present

## 2021-10-23 DIAGNOSIS — E785 Hyperlipidemia, unspecified: Secondary | ICD-10-CM | POA: Diagnosis not present

## 2021-10-23 DIAGNOSIS — G3183 Dementia with Lewy bodies: Secondary | ICD-10-CM | POA: Diagnosis not present

## 2021-10-23 DIAGNOSIS — I10 Essential (primary) hypertension: Secondary | ICD-10-CM | POA: Diagnosis not present

## 2021-10-23 DIAGNOSIS — J45909 Unspecified asthma, uncomplicated: Secondary | ICD-10-CM | POA: Diagnosis not present

## 2021-10-23 DIAGNOSIS — I959 Hypotension, unspecified: Secondary | ICD-10-CM | POA: Diagnosis not present

## 2021-10-24 DIAGNOSIS — I10 Essential (primary) hypertension: Secondary | ICD-10-CM | POA: Diagnosis not present

## 2021-10-24 DIAGNOSIS — J45909 Unspecified asthma, uncomplicated: Secondary | ICD-10-CM | POA: Diagnosis not present

## 2021-10-24 DIAGNOSIS — I959 Hypotension, unspecified: Secondary | ICD-10-CM | POA: Diagnosis not present

## 2021-10-24 DIAGNOSIS — G3183 Dementia with Lewy bodies: Secondary | ICD-10-CM | POA: Diagnosis not present

## 2021-10-24 DIAGNOSIS — F0284 Dementia in other diseases classified elsewhere, unspecified severity, with anxiety: Secondary | ICD-10-CM | POA: Diagnosis not present

## 2021-10-24 DIAGNOSIS — E785 Hyperlipidemia, unspecified: Secondary | ICD-10-CM | POA: Diagnosis not present

## 2021-10-25 DIAGNOSIS — F0284 Dementia in other diseases classified elsewhere, unspecified severity, with anxiety: Secondary | ICD-10-CM | POA: Diagnosis not present

## 2021-10-25 DIAGNOSIS — J45909 Unspecified asthma, uncomplicated: Secondary | ICD-10-CM | POA: Diagnosis not present

## 2021-10-25 DIAGNOSIS — I10 Essential (primary) hypertension: Secondary | ICD-10-CM | POA: Diagnosis not present

## 2021-10-25 DIAGNOSIS — G3183 Dementia with Lewy bodies: Secondary | ICD-10-CM | POA: Diagnosis not present

## 2021-10-25 DIAGNOSIS — I959 Hypotension, unspecified: Secondary | ICD-10-CM | POA: Diagnosis not present

## 2021-10-25 DIAGNOSIS — E785 Hyperlipidemia, unspecified: Secondary | ICD-10-CM | POA: Diagnosis not present

## 2021-10-29 DIAGNOSIS — F0284 Dementia in other diseases classified elsewhere, unspecified severity, with anxiety: Secondary | ICD-10-CM | POA: Diagnosis not present

## 2021-10-29 DIAGNOSIS — I959 Hypotension, unspecified: Secondary | ICD-10-CM | POA: Diagnosis not present

## 2021-10-29 DIAGNOSIS — E785 Hyperlipidemia, unspecified: Secondary | ICD-10-CM | POA: Diagnosis not present

## 2021-10-29 DIAGNOSIS — I10 Essential (primary) hypertension: Secondary | ICD-10-CM | POA: Diagnosis not present

## 2021-10-29 DIAGNOSIS — J45909 Unspecified asthma, uncomplicated: Secondary | ICD-10-CM | POA: Diagnosis not present

## 2021-10-29 DIAGNOSIS — G3183 Dementia with Lewy bodies: Secondary | ICD-10-CM | POA: Diagnosis not present

## 2021-10-30 DIAGNOSIS — I10 Essential (primary) hypertension: Secondary | ICD-10-CM | POA: Diagnosis not present

## 2021-10-30 DIAGNOSIS — E785 Hyperlipidemia, unspecified: Secondary | ICD-10-CM | POA: Diagnosis not present

## 2021-10-30 DIAGNOSIS — G3183 Dementia with Lewy bodies: Secondary | ICD-10-CM | POA: Diagnosis not present

## 2021-10-30 DIAGNOSIS — I959 Hypotension, unspecified: Secondary | ICD-10-CM | POA: Diagnosis not present

## 2021-10-30 DIAGNOSIS — F0284 Dementia in other diseases classified elsewhere, unspecified severity, with anxiety: Secondary | ICD-10-CM | POA: Diagnosis not present

## 2021-10-30 DIAGNOSIS — J45909 Unspecified asthma, uncomplicated: Secondary | ICD-10-CM | POA: Diagnosis not present

## 2021-10-31 DIAGNOSIS — J45909 Unspecified asthma, uncomplicated: Secondary | ICD-10-CM | POA: Diagnosis not present

## 2021-10-31 DIAGNOSIS — F0284 Dementia in other diseases classified elsewhere, unspecified severity, with anxiety: Secondary | ICD-10-CM | POA: Diagnosis not present

## 2021-10-31 DIAGNOSIS — E785 Hyperlipidemia, unspecified: Secondary | ICD-10-CM | POA: Diagnosis not present

## 2021-10-31 DIAGNOSIS — G3183 Dementia with Lewy bodies: Secondary | ICD-10-CM | POA: Diagnosis not present

## 2021-10-31 DIAGNOSIS — I959 Hypotension, unspecified: Secondary | ICD-10-CM | POA: Diagnosis not present

## 2021-10-31 DIAGNOSIS — I10 Essential (primary) hypertension: Secondary | ICD-10-CM | POA: Diagnosis not present

## 2021-11-01 DIAGNOSIS — I10 Essential (primary) hypertension: Secondary | ICD-10-CM | POA: Diagnosis not present

## 2021-11-01 DIAGNOSIS — E785 Hyperlipidemia, unspecified: Secondary | ICD-10-CM | POA: Diagnosis not present

## 2021-11-01 DIAGNOSIS — J45909 Unspecified asthma, uncomplicated: Secondary | ICD-10-CM | POA: Diagnosis not present

## 2021-11-01 DIAGNOSIS — F0284 Dementia in other diseases classified elsewhere, unspecified severity, with anxiety: Secondary | ICD-10-CM | POA: Diagnosis not present

## 2021-11-01 DIAGNOSIS — G3183 Dementia with Lewy bodies: Secondary | ICD-10-CM | POA: Diagnosis not present

## 2021-11-01 DIAGNOSIS — I959 Hypotension, unspecified: Secondary | ICD-10-CM | POA: Diagnosis not present

## 2021-11-04 DIAGNOSIS — J45909 Unspecified asthma, uncomplicated: Secondary | ICD-10-CM | POA: Diagnosis not present

## 2021-11-04 DIAGNOSIS — I10 Essential (primary) hypertension: Secondary | ICD-10-CM | POA: Diagnosis not present

## 2021-11-04 DIAGNOSIS — G3183 Dementia with Lewy bodies: Secondary | ICD-10-CM | POA: Diagnosis not present

## 2021-11-04 DIAGNOSIS — E785 Hyperlipidemia, unspecified: Secondary | ICD-10-CM | POA: Diagnosis not present

## 2021-11-04 DIAGNOSIS — I959 Hypotension, unspecified: Secondary | ICD-10-CM | POA: Diagnosis not present

## 2021-11-04 DIAGNOSIS — F0284 Dementia in other diseases classified elsewhere, unspecified severity, with anxiety: Secondary | ICD-10-CM | POA: Diagnosis not present

## 2021-11-05 DIAGNOSIS — E785 Hyperlipidemia, unspecified: Secondary | ICD-10-CM | POA: Diagnosis not present

## 2021-11-05 DIAGNOSIS — F0284 Dementia in other diseases classified elsewhere, unspecified severity, with anxiety: Secondary | ICD-10-CM | POA: Diagnosis not present

## 2021-11-05 DIAGNOSIS — G3183 Dementia with Lewy bodies: Secondary | ICD-10-CM | POA: Diagnosis not present

## 2021-11-05 DIAGNOSIS — I959 Hypotension, unspecified: Secondary | ICD-10-CM | POA: Diagnosis not present

## 2021-11-05 DIAGNOSIS — J45909 Unspecified asthma, uncomplicated: Secondary | ICD-10-CM | POA: Diagnosis not present

## 2021-11-05 DIAGNOSIS — I10 Essential (primary) hypertension: Secondary | ICD-10-CM | POA: Diagnosis not present

## 2021-11-06 DIAGNOSIS — G3183 Dementia with Lewy bodies: Secondary | ICD-10-CM | POA: Diagnosis not present

## 2021-11-06 DIAGNOSIS — I959 Hypotension, unspecified: Secondary | ICD-10-CM | POA: Diagnosis not present

## 2021-11-06 DIAGNOSIS — F0284 Dementia in other diseases classified elsewhere, unspecified severity, with anxiety: Secondary | ICD-10-CM | POA: Diagnosis not present

## 2021-11-06 DIAGNOSIS — E785 Hyperlipidemia, unspecified: Secondary | ICD-10-CM | POA: Diagnosis not present

## 2021-11-06 DIAGNOSIS — I10 Essential (primary) hypertension: Secondary | ICD-10-CM | POA: Diagnosis not present

## 2021-11-06 DIAGNOSIS — J45909 Unspecified asthma, uncomplicated: Secondary | ICD-10-CM | POA: Diagnosis not present

## 2021-11-07 ENCOUNTER — Other Ambulatory Visit: Payer: Self-pay | Admitting: Adult Health

## 2021-11-07 DIAGNOSIS — F419 Anxiety disorder, unspecified: Secondary | ICD-10-CM

## 2021-11-07 MED ORDER — LORAZEPAM 1 MG PO TABS: 1.0000 mg | ORAL_TABLET | Freq: Two times a day (BID) | ORAL | 0 refills | Status: DC

## 2021-11-08 DIAGNOSIS — G3183 Dementia with Lewy bodies: Secondary | ICD-10-CM | POA: Diagnosis not present

## 2021-11-08 DIAGNOSIS — J45909 Unspecified asthma, uncomplicated: Secondary | ICD-10-CM | POA: Diagnosis not present

## 2021-11-08 DIAGNOSIS — I959 Hypotension, unspecified: Secondary | ICD-10-CM | POA: Diagnosis not present

## 2021-11-08 DIAGNOSIS — E785 Hyperlipidemia, unspecified: Secondary | ICD-10-CM | POA: Diagnosis not present

## 2021-11-08 DIAGNOSIS — F0284 Dementia in other diseases classified elsewhere, unspecified severity, with anxiety: Secondary | ICD-10-CM | POA: Diagnosis not present

## 2021-11-08 DIAGNOSIS — I10 Essential (primary) hypertension: Secondary | ICD-10-CM | POA: Diagnosis not present

## 2021-11-12 DIAGNOSIS — E785 Hyperlipidemia, unspecified: Secondary | ICD-10-CM | POA: Diagnosis not present

## 2021-11-12 DIAGNOSIS — G3183 Dementia with Lewy bodies: Secondary | ICD-10-CM | POA: Diagnosis not present

## 2021-11-12 DIAGNOSIS — I10 Essential (primary) hypertension: Secondary | ICD-10-CM | POA: Diagnosis not present

## 2021-11-12 DIAGNOSIS — J45909 Unspecified asthma, uncomplicated: Secondary | ICD-10-CM | POA: Diagnosis not present

## 2021-11-12 DIAGNOSIS — F0284 Dementia in other diseases classified elsewhere, unspecified severity, with anxiety: Secondary | ICD-10-CM | POA: Diagnosis not present

## 2021-11-12 DIAGNOSIS — I959 Hypotension, unspecified: Secondary | ICD-10-CM | POA: Diagnosis not present

## 2021-11-13 DIAGNOSIS — G3183 Dementia with Lewy bodies: Secondary | ICD-10-CM | POA: Diagnosis not present

## 2021-11-13 DIAGNOSIS — I959 Hypotension, unspecified: Secondary | ICD-10-CM | POA: Diagnosis not present

## 2021-11-13 DIAGNOSIS — E785 Hyperlipidemia, unspecified: Secondary | ICD-10-CM | POA: Diagnosis not present

## 2021-11-13 DIAGNOSIS — I10 Essential (primary) hypertension: Secondary | ICD-10-CM | POA: Diagnosis not present

## 2021-11-13 DIAGNOSIS — J45909 Unspecified asthma, uncomplicated: Secondary | ICD-10-CM | POA: Diagnosis not present

## 2021-11-13 DIAGNOSIS — F0284 Dementia in other diseases classified elsewhere, unspecified severity, with anxiety: Secondary | ICD-10-CM | POA: Diagnosis not present

## 2021-11-15 DIAGNOSIS — J45909 Unspecified asthma, uncomplicated: Secondary | ICD-10-CM | POA: Diagnosis not present

## 2021-11-15 DIAGNOSIS — I959 Hypotension, unspecified: Secondary | ICD-10-CM | POA: Diagnosis not present

## 2021-11-15 DIAGNOSIS — F0284 Dementia in other diseases classified elsewhere, unspecified severity, with anxiety: Secondary | ICD-10-CM | POA: Diagnosis not present

## 2021-11-15 DIAGNOSIS — E785 Hyperlipidemia, unspecified: Secondary | ICD-10-CM | POA: Diagnosis not present

## 2021-11-15 DIAGNOSIS — G3183 Dementia with Lewy bodies: Secondary | ICD-10-CM | POA: Diagnosis not present

## 2021-11-15 DIAGNOSIS — I10 Essential (primary) hypertension: Secondary | ICD-10-CM | POA: Diagnosis not present

## 2021-11-19 ENCOUNTER — Non-Acute Institutional Stay (SKILLED_NURSING_FACILITY): Payer: Medicare Other | Admitting: Adult Health

## 2021-11-19 ENCOUNTER — Encounter: Payer: Self-pay | Admitting: Adult Health

## 2021-11-19 DIAGNOSIS — F251 Schizoaffective disorder, depressive type: Secondary | ICD-10-CM

## 2021-11-19 DIAGNOSIS — K219 Gastro-esophageal reflux disease without esophagitis: Secondary | ICD-10-CM | POA: Diagnosis not present

## 2021-11-19 DIAGNOSIS — F0150 Vascular dementia without behavioral disturbance: Secondary | ICD-10-CM

## 2021-11-19 DIAGNOSIS — J309 Allergic rhinitis, unspecified: Secondary | ICD-10-CM | POA: Diagnosis not present

## 2021-11-19 DIAGNOSIS — G2119 Other drug induced secondary parkinsonism: Secondary | ICD-10-CM

## 2021-11-19 NOTE — Progress Notes (Signed)
Location:  [facility] Room Number: 115 A Place of Service:  SNF (31) Provider:  Kenard Gower, DNP, FNP-BC  Patient Care Team: Pecola Lawless, MD as PCP - General (Internal Medicine) Center, [facility]) Synetta Shadow as Physician Assistant (Internal Medicine)  Extended Emergency Contact Information Primary Emergency Contact: [name] of [place] Home Phone: [phone] Relation: Daughter Secondary Emergency Contact: [name] States of [place] Home Phone: [phone] Relation: Daughter  Code Status:   DNR  Goals of care: Advanced Directive information Advanced Directives 10/07/2021  Does Patient Have a Medical Advance Directive? Yes  Type of Advance Directive Out of facility DNR (pink MOST or yellow form)  Does patient want to make changes to medical advance directive? -  Copy of Healthcare Power of Attorney in Chart? -  Would patient like information on creating a medical advance directive? -  Pre-existing out of facility DNR order (yellow form or pink MOST form) -     Chief Complaint  Patient presents with   Medical Management of Chronic Issues    Routine Visit    HPI:  Pt is a 76 y.o. female seen today for medical management of chronic diseases.   She is a long-term care resident of [facility]. She his followed by hospice.  She has a PMH of TIA, stroke, dyslipidemia and asthma.   Other drug-induced secondary parkinsonism (HCC)  has bilateral hand tremors, takes carbidopa-levodopa 25-100 mg 1 tab 3 times a day  GERD without esophagitis -stable, takes pantoprazole 40 mg 1 tab daily  Allergic rhinitis, unspecified seasonality, unspecified trigger -no noted sneezing, takes Fluticasone 50 mcg 2 sprays in each nostril daily  Schizoaffective disorder, depressive type (HCC) -  PHQ- 9 score is 0, takes Lithium carbonate ER 450 mg 1 tab at bedtime,  Duloxetine 90 daily, Ativan 1 mg every 12 hours, and ziprasidone 80 mg 1 capsule twice a day and buspirone 15 mg 1 tablet 3 times a day.   Past Medical History:  Diagnosis Date   Allergy    Anxiety    Arthritis    Asthma    Benign essential hypertension 03/18/2017   Cataract    Dementia (HCC)    Dementia with Lewy bodies (CODE)    Depression    Difficulty walking    Dysphagia, oral phase    Fibromyalgia    GERD (gastroesophageal reflux disease)    Hallucinations    Hyperlipidemia    Lower extremity edema    Repeated falls    Rhabdomyolysis    Stroke (HCC)    TIA (transient ischemic attack)    Urinary incontinence    Past Surgical History:  Procedure Laterality Date   ABDOMINAL HYSTERECTOMY     CATARACT EXTRACTION     CHOLECYSTECTOMY     TONSILLECTOMY AND ADENOIDECTOMY      Allergies  Allergen Reactions   Hydrocodone Other (See Comments)    GI upset    Outpatient Encounter Medications as of 11/19/2021  Medication Sig   acetaminophen (TYLENOL) 325 MG tablet Take 650 mg by mouth See admin instructions. 650 BID PRN and 650 BID scheduled   albuterol (PROVENTIL HFA;VENTOLIN HFA) 108 (90 Base) MCG/ACT inhaler Inhale 2 puffs into the lungs every 6 (six) hours as needed for wheezing or shortness of breath.   aspirin EC 81 MG tablet Take 81 mg by mouth daily.   bisacodyl (DULCOLAX) 10 MG suppository Place 10 mg rectally as  needed for moderate constipation.   busPIRone (BUSPAR) 15 MG tablet Take 15 mg by mouth 3 (three) times daily.   carbidopa-levodopa (SINEMET IR) 25-100 MG tablet Take 1 tablet by mouth 3 (three) times daily.   chlorhexidine gluconate, MEDLINE KIT, (PERIDEX) 0.12 % solution Use as directed 15 mLs in the mouth or throat 2 (two) times daily. BRUSH ON ONE TEASPOON OF SOLUTIONTO TEETH AND GUMS WITH A TOOTHBRUSH. SPIT OUT EXCESS AND DO NOT RINSE   cholecalciferol (VITAMIN D) 1000 units tablet Take 1,000 Units by mouth daily.    donepezil (ARICEPT) 5 MG tablet Take  5 mg by mouth at bedtime.   DULoxetine (CYMBALTA) 30 MG capsule Give 3 tablets ($RemoveBe'90mg'LyvtOnTbx$ ) by mouth daily   Ensure (ENSURE) Take 237 mLs by mouth.   Eyelid Cleansers (OCUSOFT LID SCRUB EX) Apply 1 application topically 2 (two) times daily. Both eye and rinse thoroughly   fluticasone (FLONASE) 50 MCG/ACT nasal spray Place 2 sprays into both nostrils daily.    LITHIUM CARBONATE PO Take 450 mg by mouth at bedtime.   LORazepam (ATIVAN) 1 MG tablet Take 1 tablet (1 mg total) by mouth every 12 (twelve) hours.   magnesium hydroxide (MILK OF MAGNESIA) 400 MG/5ML suspension If no BM in 3 days, give 30 cc Milk of Magnesium p.o. x 1 dose in 24 hours as needed (Do not use standing constipation orders for residents with renal failure CFR less than 30. Contact MD for orders) (Physician Order)   memantine (NAMENDA) 10 MG tablet Take 10 mg by mouth 2 (two) times daily.   midodrine (PROAMATINE) 2.5 MG tablet Take 1 tablet (2.5 mg total) by mouth 2 (two) times daily as needed. For SBP <=95   pantoprazole (PROTONIX) 40 MG tablet Take 40 mg by mouth daily.    polyethylene glycol (MIRALAX / GLYCOLAX) packet Take 17 g by mouth daily as needed.    sodium fluoride (PREVIDENT 5000 PLUS) 1.1 % CREA dental cream Place 1 application onto teeth at bedtime.    Sodium Phosphates (RA SALINE ENEMA RE) If not relieved by Biscodyl suppository, give disposable Saline Enema rectally X 1 dose/24 hrs as needed (Do not use constipation standing orders for residents with renal failure/CFR less than 30. Contact MD for orders)(Physician Or   ziprasidone (GEODON) 80 MG capsule Take 80 mg by mouth 2 (two) times daily.    No facility-administered encounter medications on file as of 11/19/2021.    Review of Systems  Constitutional:  Negative for chills and fever.  HENT:  Negative for congestion, hearing loss, rhinorrhea and sore throat.   Eyes: Negative.   Respiratory:  Negative for cough, shortness of breath and wheezing.   Cardiovascular:   Negative for chest pain, palpitations and leg swelling.  Gastrointestinal:  Negative for abdominal pain, constipation, diarrhea, nausea and vomiting.  Genitourinary:  Negative for dysuria.  Skin:  Negative for color change, rash and wound.  Neurological:  Negative for dizziness and headaches.  Psychiatric/Behavioral:  Negative for behavioral problems.       Immunization History  Administered Date(s) Administered   Influenza-Unspecified 03/17/2017, 08/17/2018, 08/29/2019, 08/28/2020   Moderna Sars-Covid-2 Vaccination 01/02/2020, 01/30/2020   PPD Test 03/24/2017, 06/02/2018, 06/08/2018   Pneumococcal Polysaccharide-23 03/17/2017, 06/12/2018   Pertinent  Health Maintenance Due  Topic Date Due   DEXA SCAN  Never done   INFLUENZA VACCINE  06/17/2021   Fall Risk 05/14/2017 05/27/2018 12/17/2018  Falls in the past year? Yes No -  Was there an injury with Fall?  No - -  Patient Fall Risk Level - - High fall risk     Vitals:   11/19/21 1422  BP: (!) 107/50  Pulse: 61  Resp: 18  Temp: (!) 97.3 F (36.3 C)  Weight: 113 lb 3.2 oz (51.3 kg)  Height: $Remove'5\' 5"'QMHJNvM$  (1.651 m)   Body mass index is 18.84 kg/m.  Physical Exam Constitutional:      Appearance: Normal appearance.  HENT:     Nose: Nose normal.     Mouth/Throat:     Mouth: Mucous membranes are moist.     Pharynx: No posterior oropharyngeal erythema.  Eyes:     Conjunctiva/sclera: Conjunctivae normal.  Cardiovascular:     Rate and Rhythm: Normal rate and regular rhythm.  Pulmonary:     Effort: Pulmonary effort is normal.     Breath sounds: Normal breath sounds.  Abdominal:     General: Bowel sounds are normal.     Palpations: Abdomen is soft.  Musculoskeletal:     Cervical back: No rigidity or tenderness.     Right lower leg: No edema.     Left lower leg: No edema.  Skin:    General: Skin is warm and dry.  Neurological:     Mental Status: Mental status is at baseline.     Comments: Alert to self, disoriented to time and  place.  Psychiatric:        Mood and Affect: Mood normal.       Labs reviewed: Recent Labs    01/16/21 0000 05/24/21 0000  NA 140 139  K 3.9 4.3  CL 104 104  CO2 27* 24*  BUN 15 15  CREATININE 0.6 0.6  CALCIUM 9.5 9.6   Recent Labs    01/16/21 0000  AST 14  ALT 9  ALKPHOS 63  ALBUMIN 4.2   Recent Labs    01/16/21 0000 05/24/21 0000  WBC 4.7 4.8  HGB 12.7 12.7  HCT 37 36  PLT 250 239   Lab Results  Component Value Date   TSH 4.07 01/30/2021   Lab Results  Component Value Date   HGBA1C 5.4 03/15/2018   Lab Results  Component Value Date   CHOL 139 03/15/2018   HDL 46 03/15/2018   LDLCALC 74 03/15/2018   TRIG 97 03/15/2018    Significant Diagnostic Results in last 30 days:  No results found.  Assessment/Plan  1. Other drug-induced secondary parkinsonism ([place]) -   Stable, continue carbidopa-levodopa   2. GERD without esophagitis Stable, continue pantoprazole  3. Allergic rhinitis, unspecified seasonality, unspecified trigger -    Stable, fluticasone nasal spray  4. Schizoaffective disorder, depressive type (HCC) -   PHQ-9 score 0, no depressed mood -   Continue duloxetine, lithium carbonate ER, ziprasidone, buspirone and Ativan  5. Vascular dementia without behavioral disturbance (HCC) -   BIMS score 9/15, ranging in moderate cognitive impairment -   Continue donepezil and memantine -    Followed by hospice   Family/ staff Communication: Discussed plan of care with resident and charge nurse  Labs/tests ordered:   None    Durenda Age, DNP, MSN, FNP-BC [facility] [phone] (Monday-Friday 8:00 a.m. - 5:00 p.m.) [phone] (after hours)

## 2021-12-19 ENCOUNTER — Non-Acute Institutional Stay (SKILLED_NURSING_FACILITY): Payer: Medicare Other | Admitting: Adult Health

## 2021-12-19 ENCOUNTER — Encounter: Payer: Self-pay | Admitting: Adult Health

## 2021-12-19 DIAGNOSIS — R627 Adult failure to thrive: Secondary | ICD-10-CM

## 2021-12-19 DIAGNOSIS — K219 Gastro-esophageal reflux disease without esophagitis: Secondary | ICD-10-CM

## 2021-12-19 DIAGNOSIS — F251 Schizoaffective disorder, depressive type: Secondary | ICD-10-CM

## 2021-12-19 DIAGNOSIS — G2119 Other drug induced secondary parkinsonism: Secondary | ICD-10-CM | POA: Diagnosis not present

## 2021-12-19 DIAGNOSIS — J309 Allergic rhinitis, unspecified: Secondary | ICD-10-CM | POA: Diagnosis not present

## 2021-12-19 DIAGNOSIS — F0150 Vascular dementia without behavioral disturbance: Secondary | ICD-10-CM

## 2021-12-19 NOTE — Progress Notes (Signed)
Location:  [facility] Room Number: 115-A Place of Service:  SNF (31) Provider:  Durenda Age, DNP, FNP-BC  Patient Care Team: Hendricks Limes, MD as PCP - General (Internal Medicine) Center, [place] ([place] Rolm Baptise as Physician Assistant (Internal Medicine)  Extended Emergency Contact Information Primary Emergency Contact: Vivia [place] of [place] Phone: [phone] Relation: Daughter Secondary Emergency Contact: Merleen Milliner States of [place] Phone: [phone] Relation: Daughter  Code Status:  DNR  Goals of care: Advanced Directive information Advanced Directives 12/19/2021  Does Patient Have a Medical Advance Directive? Yes  Type of Advance Directive Out of facility DNR (pink MOST or yellow form)  Does patient want to make changes to medical advance directive? No - Patient declined  Copy of [place] in Chart? -  Would patient like information on creating a medical advance directive? -  Pre-existing out of facility DNR order (yellow form or pink MOST form) Yellow form placed in chart (order not valid for inpatient use)     Chief Complaint  Patient presents with   Medical Management of Chronic Issues    Routine Visit    HPI:  Pt is a 76 y.o. female seen today for medical management of chronic diseases. She is a long-term care resident of [facility].  She has a PMH of TIA, stroke, dyslipidemia in the past month.  She is currently being followed by hospice.  Failure to thrive in adult  Body mass index is 18.97 kg/m. -  weight 112.2 lbs, takes Ensure 237 mL 3 times a day for supplementation  GERD without esophagitis   -   patient's omeprazole 20 mg 1 capsule twice a day  Other drug-induced secondary parkinsonism (HCC)  -  has tremors note, patient's carbidopa 5-100 mg 1 tab 3 times a day  Allergic rhinitis, unspecified  seasonality, unspecified trigger  -   no noted sneezing, takes because on 50 mcg spray 2 sprays to each nostril daily   Past Medical History:  Diagnosis Date   Allergy    Anxiety    Arthritis    Asthma    Benign essential hypertension 03/18/2017   Cataract    Dementia ([place])    Dementia with Lewy bodies (CODE)    Depression    Difficulty walking    Dysphagia, oral phase    Fibromyalgia    GERD (gastroesophageal reflux disease)    Hallucinations    Hyperlipidemia    Lower extremity edema    Repeated falls    Rhabdomyolysis    Stroke ([place])    TIA (transient ischemic attack)    Urinary incontinence    Past Surgical History:  Procedure Laterality Date   ABDOMINAL HYSTERECTOMY     CATARACT EXTRACTION     CHOLECYSTECTOMY     TONSILLECTOMY AND ADENOIDECTOMY      Allergies  Allergen Reactions   Hydrocodone Other (See Comments)    GI upset    Outpatient Encounter Medications as of 12/19/2021  Medication Sig   acetaminophen (TYLENOL) 325 MG tablet Take 650 mg by mouth See admin instructions. 650 BID PRN and 650 BID scheduled   albuterol (PROVENTIL HFA;VENTOLIN HFA) 108 (90 Base) MCG/ACT inhaler Inhale 2 puffs into the lungs every 6 (six) hours as needed for wheezing or shortness of breath.   aspirin EC 81 MG tablet Take 81 mg by mouth daily.   bisacodyl (DULCOLAX) 10 MG suppository Place 10  mg rectally as needed for moderate constipation.   busPIRone (BUSPAR) 15 MG tablet Take 15 mg by mouth 3 (three) times daily.   carbidopa-levodopa (SINEMET IR) 25-100 MG tablet Take 1 tablet by mouth 3 (three) times daily.   chlorhexidine gluconate, MEDLINE KIT, (PERIDEX) 0.12 % solution Use as directed 15 mLs in the mouth or throat 2 (two) times daily. BRUSH ON ONE TEASPOON OF SOLUTIONTO TEETH AND GUMS WITH A TOOTHBRUSH. SPIT OUT EXCESS AND DO NOT RINSE   cholecalciferol (VITAMIN D) 1000 units tablet Take 1,000 Units by mouth daily.    donepezil (ARICEPT) 5 MG tablet Take 5 mg by mouth at  bedtime.   DULoxetine (CYMBALTA) 30 MG capsule Give 3 tablets (31m) by mouth daily   Ensure (ENSURE) Take 237 mLs by mouth.   Eyelid Cleansers (OCUSOFT LID SCRUB EX) Apply 1 application topically 2 (two) times daily. Both eye and rinse thoroughly   fluticasone (FLONASE) 50 MCG/ACT nasal spray Place 2 sprays into both nostrils daily.    LITHIUM CARBONATE PO Take 450 mg by mouth at bedtime.   LORazepam (ATIVAN) 1 MG tablet Take 1 tablet (1 mg total) by mouth every 12 (twelve) hours.   magnesium hydroxide (MILK OF MAGNESIA) 400 MG/5ML suspension If no BM in 3 days, give 30 cc Milk of Magnesium p.o. x 1 dose in 24 hours as needed (Do not use standing constipation orders for residents with renal failure CFR less than 30. Contact MD for orders) (Physician Order)   memantine (NAMENDA) 10 MG tablet Take 10 mg by mouth 2 (two) times daily.   midodrine (PROAMATINE) 2.5 MG tablet Take 1 tablet (2.5 mg total) by mouth 2 (two) times daily as needed. For SBP <=95   NON FORMULARY DIET:PUREED   OMEPRAZOLE PO Take 20 mg by mouth in the morning and at bedtime.   polyethylene glycol (MIRALAX / GLYCOLAX) packet Take 17 g by mouth daily as needed.    Sodium Phosphates (RA SALINE ENEMA RE) If not relieved by Biscodyl suppository, give disposable Saline Enema rectally X 1 dose/24 hrs as needed (Do not use constipation standing orders for residents with renal failure/CFR less than 30. Contact MD for orders)(Physician Or   ziprasidone (GEODON) 80 MG capsule Take 80 mg by mouth 2 (two) times daily.    sodium fluoride (PREVIDENT 5000 PLUS) 1.1 % CREA dental cream Place 1 application onto teeth at bedtime.    [DISCONTINUED] pantoprazole (PROTONIX) 40 MG tablet Take 40 mg by mouth daily.    No facility-administered encounter medications on file as of 12/19/2021.    Review of Systems  Constitutional:  Negative for appetite change, chills, fatigue and fever.  HENT:  Negative for congestion, hearing loss, rhinorrhea and sore  throat.   Eyes: Negative.   Respiratory:  Negative for cough, shortness of breath and wheezing.   Cardiovascular:  Negative for chest pain, palpitations and leg swelling.  Gastrointestinal:  Negative for abdominal pain, constipation, diarrhea, nausea and vomiting.  Genitourinary:  Negative for dysuria.  Skin:  Negative for color change, rash and wound.  Neurological:  Negative for dizziness, weakness and headaches.  Psychiatric/Behavioral:  Negative for behavioral problems and hallucinations. The patient is not nervous/anxious.       Immunization History  Administered Date(s) Administered   Influenza-Unspecified 03/17/2017, 08/17/2018, 08/29/2019, 08/28/2020   Moderna Sars-Covid-2 Vaccination 01/02/2020, 01/30/2020   PPD Test 03/24/2017, 06/02/2018, 06/08/2018   Pneumococcal Polysaccharide-23 03/17/2017, 06/12/2018   Pertinent  Health Maintenance Due  Topic Date Due  DEXA SCAN  Never done   INFLUENZA VACCINE  06/17/2021   Fall Risk 05/14/2017 05/27/2018 12/17/2018  Falls in the past year? Yes No -  Was there an injury with Fall? No - -  Patient Fall Risk Level - - High fall risk     Vitals:   12/19/21 1424  BP: 124/66  Pulse: 74  Resp: 17  Temp: (!) 97.1 F (36.2 C)  SpO2: 94%  Weight: 114 lb (51.7 kg)  Height: _0  (1.651 m)   Body mass index is 18.97 kg/m.  Physical Exam Constitutional:      General: She is not in acute distress.    Appearance: Normal appearance.  HENT:     Head: Normocephalic and atraumatic.     Nose: Nose normal.     Mouth/Throat:     Mouth: Mucous membranes are moist.  Eyes:     Conjunctiva/sclera: Conjunctivae normal.  Cardiovascular:     Rate and Rhythm: Normal rate and regular rhythm.  Pulmonary:     Effort: Pulmonary effort is normal.     Breath sounds: Normal breath sounds.  Abdominal:     General: Bowel sounds are normal.     Palpations: Abdomen is soft.  Musculoskeletal:        General: No swelling.  Skin:    General: Skin  is warm and dry.  Neurological:     General: No focal deficit present.     Mental Status: She is disoriented.     Comments: Has tremors.  Psychiatric:        Mood and Affect: Mood normal.        Behavior: Behavior normal.     Labs reviewed: Recent Labs    01/16/21 0000 05/24/21 0000  NA 140 139  K 3.9 4.3  CL 104 104  CO2 27* 24*  BUN 15 15  CREATININE 0.6 0.6  CALCIUM 9.5 9.6   Recent Labs    01/16/21 0000  AST 14  ALT 9  ALKPHOS 63  ALBUMIN 4.2   Recent Labs    01/16/21 0000 05/24/21 0000  WBC 4.7 4.8  HGB 12.7 12.7  HCT 37 36  PLT 250 239   Lab Results  Component Value Date   TSH 4.07 01/30/2021   Lab Results  Component Value Date   HGBA1C 5.4 03/15/2018   Lab Results  Component Value Date   CHOL 139 03/15/2018   HDL 46 03/15/2018   LDLCALC 74 03/15/2018   TRIG 97 03/15/2018    Significant Diagnostic Results in last 30 days:  No results found.  Assessment/Plan  1. Failure to thrive in adult -   continue supplementations and pured diet -   Followed by hospice  2. GERD without esophagitis -Stable, continue omeprazole  3. Other drug-induced secondary parkinsonism ([place]) -  has tremors, continue carbidopa-levodopa  4. Allergic rhinitis, unspecified seasonality, unspecified trigger -   Stable, continue fluticasone nasal spray  5. Schizoaffective disorder, depressive type (HCC) -Mood is stable, continue buspirone, lithium, Ativan, duloxetine and ziprasidone  6. Vascular dementia without behavioral disturbance (HCC) -  BIMS score 9/15, ranging in moderate cognitive impairment -   Continue donepezil and memantine -   Comfort care    Family/ staff Communication: Discussed plan of care with resident and charge nurse.  Labs/tests ordered:   None    Durenda Age, DNP, MSN, FNP-BC [facility] and Adult Medicine [phone] (Monday-Friday 8:00 a.m. - 5:00 p.m.) [phone] (after hours)

## 2021-12-28 LAB — BASIC METABOLIC PANEL
BUN: 26 — AB (ref 4–21)
CO2: 27 — AB (ref 13–22)
Chloride: 108 (ref 99–108)
Creatinine: 0.5 (ref 0.5–1.1)
Glucose: 93
Potassium: 4.1 (ref 3.4–5.3)
Sodium: 142 (ref 137–147)

## 2021-12-28 LAB — COMPREHENSIVE METABOLIC PANEL
Albumin: 4.1 (ref 3.5–5.0)
Calcium: 9.6 (ref 8.7–10.7)
Globulin: 2.2

## 2021-12-28 LAB — CBC: RBC: 3.49 — AB (ref 3.87–5.11)

## 2021-12-28 LAB — HEPATIC FUNCTION PANEL
ALT: 9 (ref 7–35)
AST: 14 (ref 13–35)
Alkaline Phosphatase: 56 (ref 25–125)
Bilirubin, Total: 0.4

## 2021-12-28 LAB — CBC AND DIFFERENTIAL
HCT: 35 — AB (ref 36–46)
Hemoglobin: 12 (ref 12.0–16.0)
Neutrophils Absolute: 3.7
Platelets: 250 (ref 150–399)
WBC: 5.5

## 2021-12-28 LAB — TSH: TSH: 3.02 (ref 0.41–5.90)

## 2022-01-01 ENCOUNTER — Non-Acute Institutional Stay (SKILLED_NURSING_FACILITY): Payer: Medicare Other | Admitting: Adult Health

## 2022-01-01 DIAGNOSIS — G2119 Other drug induced secondary parkinsonism: Secondary | ICD-10-CM | POA: Diagnosis not present

## 2022-01-01 DIAGNOSIS — F251 Schizoaffective disorder, depressive type: Secondary | ICD-10-CM

## 2022-01-01 DIAGNOSIS — J309 Allergic rhinitis, unspecified: Secondary | ICD-10-CM

## 2022-01-01 DIAGNOSIS — K219 Gastro-esophageal reflux disease without esophagitis: Secondary | ICD-10-CM

## 2022-01-01 DIAGNOSIS — Z7189 Other specified counseling: Secondary | ICD-10-CM

## 2022-01-01 DIAGNOSIS — F0150 Vascular dementia without behavioral disturbance: Secondary | ICD-10-CM

## 2022-01-01 DIAGNOSIS — R627 Adult failure to thrive: Secondary | ICD-10-CM

## 2022-01-01 NOTE — Progress Notes (Signed)
Location:  [facility] Room Number: [place]:  SNF (31) Provider:  Durenda Age, DNP, FNP-BC  Patient Care Team: Hendricks Limes, MD as PCP - General (Internal Medicine) [facility] Rolm Baptise as Physician Assistant (Internal Medicine)  Extended Emergency Contact Information Primary Emergency Contact: Vivia [place] of [place] Phone: [phone] Relation: Daughter Secondary Emergency Contact: Merleen Milliner [place] of [place] Phone: [phone] Relation: Daughter  Code Status:  DNR  Goals of care: Advanced Directive information Advanced Directives 12/19/2021  Does Patient Have a Medical Advance Directive? Yes  Type of Advance Directive Out of facility DNR (pink MOST or yellow form)  Does patient want to make changes to medical advance directive? No - Patient declined  Copy of [place] in Chart? -  Would patient like information on creating a medical advance directive? -  Pre-existing out of facility DNR order (yellow form or pink MOST form) Yellow form placed in chart (order not valid for inpatient use)     Chief Complaint  Patient presents with   Acute Visit    Care plan meeting    HPI:  Pt is a 76 y.o. female who had a care plan meeting today attended by Education officer, museum, life enrichment, dietary, NP and daughter, Karren Cobble, who attended via teleconference.  He remains to be DNR.  Discussed medications, vital signs and weights. Daughter requested for resident not to be up on a geri-chair outside of her room. Activity was requested to for a 1:1 visit in the room.  Daughter stated that resident does not remember her name. She thinks that her mother is hallucinating heavy and might be having agoraphobia. She is currently followed by hospice/comfort care. Latest weight is 114 lbs., stable. She requires assistance with eating. The meeting  lasted for 25 minutes.   Past Medical History:  Diagnosis Date   Allergy    Anxiety    Arthritis    Asthma    Benign essential hypertension 03/18/2017   Cataract    Dementia (HCC)    Dementia with Lewy bodies (CODE)    Depression    Difficulty walking    Dysphagia, oral phase    Fibromyalgia    GERD (gastroesophageal reflux disease)    Hallucinations    Hyperlipidemia    Lower extremity edema    Repeated falls    Rhabdomyolysis    Stroke (HCC)    TIA (transient ischemic attack)    Urinary incontinence    Past Surgical History:  Procedure Laterality Date   ABDOMINAL HYSTERECTOMY     CATARACT EXTRACTION     CHOLECYSTECTOMY     TONSILLECTOMY AND ADENOIDECTOMY      Allergies  Allergen Reactions   Hydrocodone Other (See Comments)    GI upset    Outpatient Encounter Medications as of 01/01/2022  Medication Sig   acetaminophen (TYLENOL) 325 MG tablet Take 650 mg by mouth See admin instructions. 650 BID PRN and 650 BID scheduled   albuterol (PROVENTIL HFA;VENTOLIN HFA) 108 (90 Base) MCG/ACT inhaler Inhale 2 puffs into the lungs every 6 (six) hours as needed for wheezing or shortness of breath.   aspirin EC 81 MG tablet Take 81 mg by mouth daily.   bisacodyl (DULCOLAX) 10 MG suppository Place 10 mg rectally as needed for moderate constipation.   busPIRone (BUSPAR) 15 MG tablet Take 15 mg by mouth 3 (three) times daily.   carbidopa-levodopa (SINEMET  IR) 25-100 MG tablet Take 1 tablet by mouth 3 (three) times daily.   chlorhexidine gluconate, MEDLINE KIT, (PERIDEX) 0.12 % solution Use as directed 15 mLs in the mouth or throat 2 (two) times daily. BRUSH ON ONE TEASPOON OF SOLUTIONTO TEETH AND GUMS WITH A TOOTHBRUSH. SPIT OUT EXCESS AND DO NOT RINSE   cholecalciferol (VITAMIN D) 1000 units tablet Take 1,000 Units by mouth daily.    donepezil (ARICEPT) 5 MG tablet Take 5 mg by mouth at bedtime.   DULoxetine (CYMBALTA) 30 MG capsule Give 3 tablets ($RemoveBe'90mg'OBMhZHPWs$ ) by mouth daily   Ensure  (ENSURE) Take 237 mLs by mouth.   Eyelid Cleansers (OCUSOFT LID SCRUB EX) Apply 1 application topically 2 (two) times daily. Both eye and rinse thoroughly   fluticasone (FLONASE) 50 MCG/ACT nasal spray Place 2 sprays into both nostrils daily.    LITHIUM CARBONATE PO Take 450 mg by mouth at bedtime.   magnesium hydroxide (MILK OF MAGNESIA) 400 MG/5ML suspension If no BM in 3 days, give 30 cc Milk of Magnesium p.o. x 1 dose in 24 hours as needed (Do not use standing constipation orders for residents with renal failure CFR less than 30. Contact MD for orders) (Physician Order)   memantine (NAMENDA) 10 MG tablet Take 10 mg by mouth 2 (two) times daily.   midodrine (PROAMATINE) 2.5 MG tablet Take 1 tablet (2.5 mg total) by mouth 2 (two) times daily as needed. For SBP <=95   NON FORMULARY DIET:PUREED   OMEPRAZOLE PO Take 20 mg by mouth in the morning and at bedtime.   polyethylene glycol (MIRALAX / GLYCOLAX) packet Take 17 g by mouth daily as needed.    sodium fluoride (PREVIDENT 5000 PLUS) 1.1 % CREA dental cream Place 1 application onto teeth at bedtime.    Sodium Phosphates (RA SALINE ENEMA RE) If not relieved by Biscodyl suppository, give disposable Saline Enema rectally X 1 dose/24 hrs as needed (Do not use constipation standing orders for residents with renal failure/CFR less than 30. Contact MD for orders)(Physician Or   ziprasidone (GEODON) 80 MG capsule Take 80 mg by mouth 2 (two) times daily.    [DISCONTINUED] LORazepam (ATIVAN) 1 MG tablet Take 1 tablet (1 mg total) by mouth every 12 (twelve) hours.   No facility-administered encounter medications on file as of 01/01/2022.    Review of Systems  Unable to obtain due to being sleepy.    Immunization History  Administered Date(s) Administered   Influenza-Unspecified 03/17/2017, 08/17/2018, 08/29/2019, 08/28/2020   Moderna Sars-Covid-2 Vaccination 01/02/2020, 01/30/2020   PPD Test 03/24/2017, 06/02/2018, 06/08/2018   Pneumococcal  Polysaccharide-23 03/17/2017, 06/12/2018   Pertinent  Health Maintenance Due  Topic Date Due   DEXA SCAN  Never done   INFLUENZA VACCINE  06/17/2021   Fall Risk 05/14/2017 05/27/2018 12/17/2018  Falls in the past year? Yes No -  Was there an injury with Fall? No - -  Patient Fall Risk Level - - High fall risk     Vitals:   01/01/22 1000  BP: 108/62  Pulse: 74  Resp: 18  Temp: 97.8 F (36.6 C)  Weight: 114 lb (51.7 kg)  Height: $Remove'5\' 5"'IhTAUGL$  (1.651 m)   Body mass index is 18.97 kg/m.  Physical Exam Constitutional:      General: She is not in acute distress. HENT:     Head: Normocephalic and atraumatic.     Nose: Nose normal.     Mouth/Throat:     Mouth: Mucous membranes are moist.  Eyes:     Conjunctiva/sclera: Conjunctivae normal.  Cardiovascular:     Rate and Rhythm: Normal rate and regular rhythm.  Pulmonary:     Effort: Pulmonary effort is normal.     Breath sounds: Normal breath sounds.  Abdominal:     General: Bowel sounds are normal.     Palpations: Abdomen is soft.  Musculoskeletal:        General: No swelling.  Skin:    General: Skin is warm and dry.  Neurological:     General: No focal deficit present.     Mental Status: Mental status is at baseline. She is disoriented.     Comments: Sleepy.  Psychiatric:        Mood and Affect: Mood normal.        Behavior: Behavior normal.       Labs reviewed: Recent Labs    01/16/21 0000 05/24/21 0000 12/28/21 0000  NA 140 139 142  K 3.9 4.3 4.1  CL 104 104 108  CO2 27* 24* 27*  BUN 15 15 26*  CREATININE 0.6 0.6 0.5  CALCIUM 9.5 9.6 9.6   Recent Labs    01/16/21 0000 12/28/21 0000  AST 14 14  ALT 9 9  ALKPHOS 63 56  ALBUMIN 4.2 4.1   Recent Labs    01/16/21 0000 05/24/21 0000 12/28/21 0000  WBC 4.7 4.8 5.5  NEUTROABS  --   --  3.70  HGB 12.7 12.7 12.0  HCT 37 36 35*  PLT 250 239 250   Lab Results  Component Value Date   TSH 3.02 12/28/2021   Lab Results  Component Value Date   HGBA1C  5.4 03/15/2018   Lab Results  Component Value Date   CHOL 139 03/15/2018   HDL 46 03/15/2018   LDLCALC 74 03/15/2018   TRIG 97 03/15/2018    Significant Diagnostic Results in last 30 days:  No results found.  Assessment/Plan  1. Advance care planning -   Remains to be DNR -   Discussed medications, vital signs and weights  2. Failure to thrive in adult Wt Readings from Last 3 Encounters:  01/01/22 114 lb (51.7 kg)  12/19/21 114 lb (51.7 kg)  11/19/21 113 lb 3.2 oz (51.3 kg)   Body mass index is 18.97 kg/m. -Continue supplementation with Ensure and pured diet  3. GERD without esophagitis -   Stable, continue omeprazole  4. 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 Allergic rhinitis, unspecified seasonality, unspecified trigger -    Stable, continue fluticasone nasal spray  6. Schizoaffective disorder, depressive type (HCC) Continue duloxetine, ziprasidone, Ativan, lithium carbonate and buspirone  7. Vascular dementia without behavioral disturbance (HCC) -   Continue donepezil and memantine -   Continue comfort care/hospice  Family/ staff Communication: Discussed plan of care with IDT and daughter.  Labs/tests ordered:  None    Durenda Age, DNP, MSN, FNP-BC [facility] [phone] (Monday-Friday 8:00 a.m. - 5:00 p.m.) [phone] (after hours)

## 2022-01-03 ENCOUNTER — Other Ambulatory Visit: Payer: Self-pay | Admitting: Adult Health

## 2022-01-03 DIAGNOSIS — F419 Anxiety disorder, unspecified: Secondary | ICD-10-CM

## 2022-01-03 MED ORDER — LORAZEPAM 1 MG PO TABS: 1.0000 mg | ORAL_TABLET | Freq: Two times a day (BID) | ORAL | 0 refills | Status: DC

## 2022-02-09 ENCOUNTER — Other Ambulatory Visit: Payer: Self-pay | Admitting: Adult Health

## 2022-02-09 DIAGNOSIS — F419 Anxiety disorder, unspecified: Secondary | ICD-10-CM

## 2022-02-09 MED ORDER — LORAZEPAM 1 MG PO TABS: 1.0000 mg | ORAL_TABLET | Freq: Two times a day (BID) | ORAL | 0 refills | Status: DC

## 2022-03-04 ENCOUNTER — Non-Acute Institutional Stay (SKILLED_NURSING_FACILITY): Payer: Medicare Other | Admitting: Adult Health

## 2022-03-04 ENCOUNTER — Encounter: Payer: Self-pay | Admitting: Adult Health

## 2022-03-04 DIAGNOSIS — K5901 Slow transit constipation: Secondary | ICD-10-CM

## 2022-03-04 DIAGNOSIS — E870 Hyperosmolality and hypernatremia: Secondary | ICD-10-CM

## 2022-03-04 DIAGNOSIS — F0150 Vascular dementia without behavioral disturbance: Secondary | ICD-10-CM | POA: Diagnosis not present

## 2022-03-04 DIAGNOSIS — R627 Adult failure to thrive: Secondary | ICD-10-CM

## 2022-03-04 DIAGNOSIS — D72829 Elevated white blood cell count, unspecified: Secondary | ICD-10-CM

## 2022-03-04 NOTE — Progress Notes (Signed)
? ?Location:  [facility] ?Nursing Home Room Number: 109-A ?Place of Service:  SNF (31) ?Provider:  Durenda Age, DNP, FNP-BC ? ?Patient Care Team: ?Hendricks Limes, MD as PCP - General (Internal Medicine) ?[facility], [place] ([place] ?Wille Celeste PA-C as Physician Assistant (Internal Medicine) ? ?Extended Emergency Contact Information ?Primary Emergency Contact: [name],[name] ? [place] ?Home Phone: [phone] ?Relation: Daughter ?Secondary Emergency Contact: Miller,Amy ? [place] ?Home Phone: [phone] ?Relation: Daughter ? ?Code Status:  DNR ? ?Goals of care: Advanced Directive information ? ?  03/04/2022  ? 12:05 PM  ?Advanced Directives  ?Does Patient Have a Medical Advance Directive? Yes  ?Type of Advance Directive Out of facility DNR (pink MOST or yellow form)  ?Does patient want to make changes to medical advance directive? No - Patient declined  ?Pre-existing out of facility DNR order (yellow form or pink MOST form) Yellow form placed in chart (order not valid for inpatient use)  ? ? ? ?Chief Complaint  ?Patient presents with  ? Acute Visit  ?  Abdominal pain   ? ? ?HPI:  ?Pt is a 76 y.o. female seen today for an acute visit regarding complaints of  abdominal pain. Resident is a long-term care resident of [facility]. She is on comfort care and followed by hospice. She was reported to have vomited and was having abdominal pain. KUB was done with unremarkable impression but has modest amount of stool throughout the colon and rectum. Stat labs showed wbc 21.6, hgb 14.0, glucose 134, creatinine 0.76, BUN 54.2 sodium 147, GFR 75.27. No reported fever.  ? ?Past Medical History:  ?Diagnosis Date  ? Allergy   ? Anxiety   ? Arthritis   ? Asthma   ? Benign essential hypertension 03/18/2017  ? Cataract   ? Dementia ([place])   ? Dementia with Lewy bodies (CODE)   ? Depression   ? Difficulty walking   ? Dysphagia, oral  phase   ? Fibromyalgia   ? GERD (gastroesophageal reflux disease)   ? Hallucinations   ? Hyperlipidemia   ? Lower extremity edema   ? Repeated falls   ? Rhabdomyolysis   ? Stroke [facility])   ? TIA (transient ischemic attack)   ? Urinary incontinence   ? ?Past Surgical History:  ?Procedure Laterality Date  ? ABDOMINAL HYSTERECTOMY    ? CATARACT EXTRACTION    ? CHOLECYSTECTOMY    ? TONSILLECTOMY AND ADENOIDECTOMY    ? ? ?Allergies  ?Allergen Reactions  ? Hydrocodone Other (See Comments)  ?  GI upset  ? ? ?Outpatient Encounter Medications as of 03/04/2022  ?Medication Sig  ? acetaminophen (TYLENOL) 325 MG tablet Take 650 mg by mouth See admin instructions. 650 twice daily as needed and 650 twice daily scheduled  ? albuterol (PROVENTIL HFA;VENTOLIN HFA) 108 (90 Base) MCG/ACT inhaler Inhale 2 puffs into the lungs every 6 (six) hours as needed for wheezing or shortness of breath.  ? aspirin EC 81 MG tablet Take 81 mg by mouth daily.  ? bisacodyl (DULCOLAX) 10 MG suppository Place 10 mg rectally as needed for moderate constipation.  ? busPIRone (BUSPAR) 15 MG tablet Take 15 mg by mouth 3 (three) times daily.  ? carbidopa-levodopa (SINEMET IR) 25-100 MG tablet Take 1 tablet by mouth 3 (three) times daily.  ? chlorhexidine gluconate, MEDLINE KIT, (PERIDEX) 0.12 % solution Use as directed 15 mLs in the mouth or throat 2 (two) times daily. BRUSH ON ONE  TEASPOON OF SOLUTIONTO TEETH AND ?GUMS WITH A TOOTHBRUSH. SPIT OUT EXCESS AND DO ?NOT RINSE  ? cholecalciferol (VITAMIN D) 1000 units tablet Take 1,000 Units by mouth daily.   ? donepezil (ARICEPT) 5 MG tablet Take 5 mg by mouth at bedtime.  ? DULoxetine (CYMBALTA) 30 MG capsule Give 3 tablets (72m) by mouth daily  ? Ensure (ENSURE) Take 237 mLs by mouth.  ? Eyelid Cleansers (OCUSOFT LID SCRUB EX) Apply 1 application topically 2 (two) times daily. Both eye and rinse thoroughly  ? fluticasone (FLONASE) 50 MCG/ACT nasal spray Place 2 sprays into both nostrils daily.   ? LITHIUM  CARBONATE PO Take 450 mg by mouth at bedtime.  ? LORazepam (ATIVAN) 1 MG tablet Take 1 mg by mouth every 4 (four) hours as needed for anxiety. And every 12 hours scheduled  ? magnesium hydroxide (MILK OF MAGNESIA) 400 MG/5ML suspension If no BM in 3 days, give 30 cc Milk of Magnesium p.o. x 1 dose in 24 hours as needed (Do not use standing constipation orders for residents with renal failure CFR less than 30. Contact MD for orders) (Physician Order)  ? memantine (NAMENDA) 10 MG tablet Take 10 mg by mouth 2 (two) times daily.  ? NON FORMULARY DIET:PUREED  ? OMEPRAZOLE PO Take 20 mg by mouth in the morning and at bedtime.  ? oxyCODONE (OXY IR/ROXICODONE) 5 MG immediate release tablet Take 5 mg by mouth every 4 (four) hours as needed for severe pain.  ? polyethylene glycol (MIRALAX / GLYCOLAX) packet Take 17 g by mouth daily as needed.   ? sodium fluoride (PREVIDENT 5000 PLUS) 1.1 % CREA dental cream Place 1 application onto teeth at bedtime.   ? Sodium Phosphates (RA SALINE ENEMA RE) If not relieved by Biscodyl suppository, give disposable Saline Enema rectally X 1 dose/24 hrs as needed (Do not use constipation standing orders for residents with renal failure/CFR less than 30. Contact MD for orders)(Physician Or  ? ziprasidone (GEODON) 80 MG capsule Take 80 mg by mouth 2 (two) times daily.   ? [DISCONTINUED] LORazepam (ATIVAN) 1 MG tablet Take 1 tablet (1 mg total) by mouth every 12 (twelve) hours.  ? [DISCONTINUED] midodrine (PROAMATINE) 2.5 MG tablet Take 1 tablet (2.5 mg total) by mouth 2 (two) times daily as needed. For SBP <=95  ? ?No facility-administered encounter medications on file as of 03/04/2022.  ? ? ?Review of Systems   Unable to obtain due to nonverbal. ? ? ? ?Immunization History  ?Administered Date(s) Administered  ? Influenza-Unspecified 03/17/2017, 08/17/2018, 08/29/2019, 08/28/2020  ? Moderna Sars-Covid-2 Vaccination 01/02/2020, 01/30/2020  ? PPD Test 03/24/2017, 06/02/2018, 06/08/2018  ?  Pneumococcal Polysaccharide-23 03/17/2017, 06/12/2018  ? ?Pertinent  Health Maintenance Due  ?Topic Date Due  ? DEXA SCAN  Never done  ? INFLUENZA VACCINE  06/17/2022  ? ? ?  05/14/2017  ?  1:29 PM 05/27/2018  ? 11:40 AM 12/17/2018  ?  4:53 AM  ?Fall Risk  ?Falls in the past year? Yes No   ?Was there an injury with Fall? No    ?Patient Fall Risk Level   High fall risk  ? ? ? ?Vitals:  ? 03/04/22 1204  ?BP: 128/72  ?Pulse: 82  ?Resp: 18  ?Temp: 97.6 ?F (36.4 ?C)  ?Weight: 113 lb 12.8 oz (51.6 kg)  ?Height: _0  (1.651 m)  ? ?Body mass index is 18.94 kg/m?. ? ?Physical Exam ?Constitutional:   ?   Appearance: Normal appearance. She is diaphoretic.  ?  HENT:  ?   Head: Normocephalic and atraumatic.  ?   Nose: Nose normal.  ?   Mouth/Throat:  ?   Mouth: Mucous membranes are moist.  ?Eyes:  ?   Conjunctiva/sclera: Conjunctivae normal.  ?Cardiovascular:  ?   Rate and Rhythm: Normal rate and regular rhythm.  ?Pulmonary:  ?   Effort: Pulmonary effort is normal.  ?   Breath sounds: Normal breath sounds.  ?Abdominal:  ?   General: Bowel sounds are normal.  ?   Palpations: Abdomen is soft.  ?   Tenderness: There is abdominal tenderness.  ?Skin: ?   General: Skin is warm.  ?Neurological:  ?   Mental Status: Mental status is at baseline.  ?   Comments: Sleepy  ?Psychiatric:     ?   Mood and Affect: Mood normal.     ?   Behavior: Behavior normal.  ?  ? ? ? ?Labs reviewed: ?Recent Labs  ?  05/24/21 ?0000 12/28/21 ?0000  ?NA 139 142  ?K 4.3 4.1  ?CL 104 108  ?CO2 24* 27*  ?BUN 15 26*  ?CREATININE 0.6 0.5  ?CALCIUM 9.6 9.6  ? ?Recent Labs  ?  12/28/21 ?0000  ?AST 14  ?ALT 9  ?ALKPHOS 56  ?ALBUMIN 4.1  ? ?Recent Labs  ?  05/24/21 ?0000 12/28/21 ?0000  ?WBC 4.8 5.5  ?NEUTROABS  --  3.70  ?HGB 12.7 12.0  ?HCT 36 35*  ?PLT 239 250  ? ?Lab Results  ?Component Value Date  ? TSH 3.02 12/28/2021  ? ?Lab Results  ?Component Value Date  ? HGBA1C 5.4 03/15/2018  ? ?Lab Results  ?Component Value Date  ? CHOL 139 03/15/2018  ? HDL 46 03/15/2018  ?  [place] 74 03/15/2018  ? TRIG 97 03/15/2018  ? ? ?Significant Diagnostic Results in last 30 days:  ?No results found. ? ?Assessment/Plan ? ?1. Leukocytosis, unspecified type ?-   wbc 21.6 ?-  will start on Rocephin 1 g
